# Patient Record
Sex: Female | Born: 1980
Health system: Southern US, Community
[De-identification: ages and names within clinical notes are randomized; demographics above are authoritative.]

## PROBLEM LIST (undated history)

## (undated) ENCOUNTER — Inpatient Hospital Stay (HOSPITAL_COMMUNITY): Payer: Self-pay

## (undated) DIAGNOSIS — K921 Melena: Secondary | ICD-10-CM

## (undated) DIAGNOSIS — Z8719 Personal history of other diseases of the digestive system: Secondary | ICD-10-CM

## (undated) DIAGNOSIS — R140 Abdominal distension (gaseous): Secondary | ICD-10-CM

## (undated) DIAGNOSIS — I10 Essential (primary) hypertension: Secondary | ICD-10-CM

## (undated) DIAGNOSIS — O139 Gestational [pregnancy-induced] hypertension without significant proteinuria, unspecified trimester: Secondary | ICD-10-CM

## (undated) DIAGNOSIS — R200 Anesthesia of skin: Secondary | ICD-10-CM

## (undated) DIAGNOSIS — K429 Umbilical hernia without obstruction or gangrene: Secondary | ICD-10-CM

## (undated) DIAGNOSIS — Z8619 Personal history of other infectious and parasitic diseases: Secondary | ICD-10-CM

## (undated) DIAGNOSIS — R109 Unspecified abdominal pain: Secondary | ICD-10-CM

## (undated) DIAGNOSIS — K625 Hemorrhage of anus and rectum: Secondary | ICD-10-CM

## (undated) DIAGNOSIS — K5790 Diverticulosis of intestine, part unspecified, without perforation or abscess without bleeding: Secondary | ICD-10-CM

## (undated) DIAGNOSIS — F419 Anxiety disorder, unspecified: Secondary | ICD-10-CM

## (undated) DIAGNOSIS — IMO0001 Reserved for inherently not codable concepts without codable children: Secondary | ICD-10-CM

## (undated) DIAGNOSIS — K5792 Diverticulitis of intestine, part unspecified, without perforation or abscess without bleeding: Secondary | ICD-10-CM

## (undated) DIAGNOSIS — K5900 Constipation, unspecified: Secondary | ICD-10-CM

## (undated) DIAGNOSIS — O99210 Obesity complicating pregnancy, unspecified trimester: Secondary | ICD-10-CM

## (undated) HISTORY — DX: Melena: K92.1

## (undated) HISTORY — DX: Essential (primary) hypertension: I10

## (undated) HISTORY — DX: Diverticulosis of intestine, part unspecified, without perforation or abscess without bleeding: K57.90

## (undated) HISTORY — DX: Anxiety disorder, unspecified: F41.9

## (undated) HISTORY — DX: Unspecified abdominal pain: R10.9

## (undated) HISTORY — DX: Constipation, unspecified: K59.00

## (undated) HISTORY — DX: Umbilical hernia without obstruction or gangrene: K42.9

## (undated) HISTORY — DX: Abdominal distension (gaseous): R14.0

## (undated) HISTORY — DX: Hemorrhage of anus and rectum: K62.5

---

## 2009-09-17 HISTORY — PX: SCAR REVISION: SHX5285

## 2011-07-26 ENCOUNTER — Ambulatory Visit (INDEPENDENT_AMBULATORY_CARE_PROVIDER_SITE_OTHER): Payer: 59 | Admitting: Internal Medicine

## 2011-07-26 VITALS — BP 102/82 | HR 123 | Temp 99.7°F | Resp 16 | Ht 62.5 in | Wt 282.2 lb

## 2011-07-26 DIAGNOSIS — F419 Anxiety disorder, unspecified: Secondary | ICD-10-CM | POA: Insufficient documentation

## 2011-07-26 DIAGNOSIS — R1110 Vomiting, unspecified: Secondary | ICD-10-CM

## 2011-07-26 DIAGNOSIS — R509 Fever, unspecified: Secondary | ICD-10-CM

## 2011-07-26 DIAGNOSIS — D72829 Elevated white blood cell count, unspecified: Secondary | ICD-10-CM

## 2011-07-26 DIAGNOSIS — R197 Diarrhea, unspecified: Secondary | ICD-10-CM

## 2011-07-26 LAB — POCT CBC
Granulocyte percent: 90.9 %G — AB (ref 37–80)
HCT, POC: 44.7 % (ref 37.7–47.9)
Hemoglobin: 14.9 g/dL (ref 12.2–16.2)
Lymph, poc: 0.7 (ref 0.6–3.4)
MCH, POC: 29.4 pg (ref 27–31.2)
MCHC: 33.3 g/dL (ref 31.8–35.4)
MCV: 88.4 fL (ref 80–97)
MID (cbc): 0.4 (ref 0–0.9)
MPV: 8 fL (ref 0–99.8)
POC Granulocyte: 11.2 — AB (ref 2–6.9)
POC LYMPH PERCENT: 5.8 %L — AB (ref 10–50)
POC MID %: 3.3 %M (ref 0–12)
Platelet Count, POC: 392 10*3/uL (ref 142–424)
RBC: 5.06 M/uL (ref 4.04–5.48)
RDW, POC: 12.8 %
WBC: 12.3 10*3/uL — AB (ref 4.6–10.2)

## 2011-07-26 MED ORDER — ONDANSETRON HCL 8 MG PO TABS: 8.0000 mg | ORAL_TABLET | Freq: Three times a day (TID) | ORAL | Status: AC | PRN

## 2011-07-26 MED ORDER — ONDANSETRON 4 MG PO TBDP
4.0000 mg | ORAL_TABLET | Freq: Once | ORAL | Status: AC
  Administered 2011-07-26: 8 mg via ORAL

## 2011-07-26 NOTE — Patient Instructions (Signed)
Viral Gastroenteritis Viral gastroenteritis is also known as stomach flu. This condition affects the stomach and intestinal tract. It can cause sudden diarrhea and vomiting. The illness typically lasts 3 to 8 days. Most people develop an immune response that eventually gets rid of the virus. While this natural response develops, the virus can make you quite ill. CAUSES  Many different viruses can cause gastroenteritis, such as rotavirus or noroviruses. You can catch one of these viruses by consuming contaminated food or water. You may also catch a virus by sharing utensils or other personal items with an infected person or by touching a contaminated surface. SYMPTOMS  The most common symptoms are diarrhea and vomiting. These problems can cause a severe loss of body fluids (dehydration) and a body salt (electrolyte) imbalance. Other symptoms may include:  Fever.   Headache.   Fatigue.   Abdominal pain.  DIAGNOSIS  Your caregiver can usually diagnose viral gastroenteritis based on your symptoms and a physical exam. A stool sample may also be taken to test for the presence of viruses or other infections. TREATMENT  This illness typically goes away on its own. Treatments are aimed at rehydration. The most serious cases of viral gastroenteritis involve vomiting so severely that you are not able to keep fluids down. In these cases, fluids must be given through an intravenous line (IV). HOME CARE INSTRUCTIONS   Drink enough fluids to keep your urine clear or pale yellow. Drink small amounts of fluids frequently and increase the amounts as tolerated.   Ask your caregiver for specific rehydration instructions.   Avoid:   Foods high in sugar.   Alcohol.   Carbonated drinks.   Tobacco.   Juice.   Caffeine drinks.   Extremely hot or cold fluids.   Fatty, greasy foods.   Too much intake of anything at one time.   Dairy products until 24 to 48 hours after diarrhea stops.   You may  consume probiotics. Probiotics are active cultures of beneficial bacteria. They may lessen the amount and number of diarrheal stools in adults. Probiotics can be found in yogurt with active cultures and in supplements.   Wash your hands well to avoid spreading the virus.   Only take over-the-counter or prescription medicines for pain, discomfort, or fever as directed by your caregiver. Do not give aspirin to children. Antidiarrheal medicines are not recommended.   Ask your caregiver if you should continue to take your regular prescribed and over-the-counter medicines.   Keep all follow-up appointments as directed by your caregiver.  SEEK IMMEDIATE MEDICAL CARE IF:   You are unable to keep fluids down.   You do not urinate at least once every 6 to 8 hours.   You develop shortness of breath.   You notice blood in your stool or vomit. This may look like coffee grounds.   You have abdominal pain that increases or is concentrated in one small area (localized).   You have persistent vomiting or diarrhea.   You have a fever.   The patient is a child younger than 3 months, and he or she has a fever.   The patient is a child older than 3 months, and he or she has a fever and persistent symptoms.   The patient is a child older than 3 months, and he or she has a fever and symptoms suddenly get worse.   The patient is a baby, and he or she has no tears when crying.  MAKE SURE YOU:     Understand these instructions.   Will watch your condition.   Will get help right away if you are not doing well or get worse.  Document Released: 04/11/2005 Document Revised: 03/31/2011 Document Reviewed: 01/26/2011 [employer] Patient Information 2012 [employer]. 

## 2011-07-26 NOTE — Progress Notes (Signed)
  Subjective:    Patient ID: Ann Santos, female    DOB: [birth year], 31 y.o.   MRN: [id number]  HPI 1 day of rapid onset vomiting, diarrhea , vomited 7x, diarrhea 4-5x all today. Is light headed, and fatigue with low grade fever. Others in family have same sxs.  Review of Systems     Objective:   Physical Exam Alert and oriented Lungs clear, heart normal. MMs are still moist Abd is soft, tender, no reb or gding with active BS  Results for orders placed in visit on 07/26/11  POCT CBC      Component Value Range   WBC 12.3 (*) 4.6 - 10.2 (K/uL)   Lymph, poc 0.7  0.6 - 3.4    POC LYMPH PERCENT 5.8 (*) 10 - 50 (%L)   MID (cbc) 0.4  0 - 0.9    POC MID % 3.3  0 - 12 (%M)   POC Granulocyte 11.2 (*) 2 - 6.9    Granulocyte percent 90.9 (*) 37 - 80 (%G)   RBC 5.06  4.04 - 5.48 (M/uL)   Hemoglobin 14.9  12.2 - 16.2 (g/dL)   HCT, POC 40.9  81.1 - 47.9 (%)   MCV 88.4  80 - 97 (fL)   MCH, POC 29.4  27 - 31.2 (pg)   MCHC 33.3  31.8 - 35.4 (g/dL)   RDW, POC 91.4     Platelet Count, POC 392  142 - 424 (K/uL)   MPV 8.0  0 - 99.8 (fL)   Leukocytosis      Assessment & Plan:  Viral gastroenteritis Zofran ODT 8mg  now po Diet Hydration

## 2011-08-04 ENCOUNTER — Telehealth: Payer: Self-pay

## 2011-08-04 NOTE — Telephone Encounter (Signed)
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

## 2011-08-07 NOTE — Telephone Encounter (Signed)
LMOM TO CB 

## 2011-08-08 NOTE — Telephone Encounter (Signed)
Ok or note. Please complete and fax.  Genie Wenke

## 2011-08-08 NOTE — Telephone Encounter (Signed)
[facility] notifying patient that note was faxed.

## 2011-08-08 NOTE — Telephone Encounter (Signed)
Pt CB to check on OOW note. She was here on Apr 2 on trip from [place]. She was here sick for a wk and then RT [place]. 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

## 2011-08-09 ENCOUNTER — Telehealth: Payer: Self-pay

## 2011-08-09 NOTE — Telephone Encounter (Signed)
Pt states she gave incorrect fax no to telephone operator yesterday, she needs oow note faxed to employer at [phone] instead??

## 2011-08-09 NOTE — Telephone Encounter (Signed)
Letter faxed to updated fax number.

## 2011-10-05 ENCOUNTER — Inpatient Hospital Stay (HOSPITAL_COMMUNITY)
Admission: EM | Admit: 2011-10-05 | Discharge: 2011-10-07 | DRG: 379 | Disposition: A | Discharge status: Home / Self Care | Payer: 59 | Attending: Internal Medicine | Admitting: Internal Medicine

## 2011-10-05 ENCOUNTER — Encounter (HOSPITAL_COMMUNITY): Payer: Self-pay

## 2011-10-05 DIAGNOSIS — K644 Residual hemorrhoidal skin tags: Secondary | ICD-10-CM | POA: Diagnosis present

## 2011-10-05 DIAGNOSIS — K2970 Gastritis, unspecified, without bleeding: Secondary | ICD-10-CM | POA: Diagnosis present

## 2011-10-05 DIAGNOSIS — K429 Umbilical hernia without obstruction or gangrene: Secondary | ICD-10-CM | POA: Diagnosis present

## 2011-10-05 DIAGNOSIS — K5731 Diverticulosis of large intestine without perforation or abscess with bleeding: Principal | ICD-10-CM | POA: Diagnosis present

## 2011-10-05 DIAGNOSIS — F411 Generalized anxiety disorder: Secondary | ICD-10-CM | POA: Diagnosis present

## 2011-10-05 DIAGNOSIS — K648 Other hemorrhoids: Secondary | ICD-10-CM | POA: Diagnosis present

## 2011-10-05 DIAGNOSIS — Z79899 Other long term (current) drug therapy: Secondary | ICD-10-CM

## 2011-10-05 DIAGNOSIS — R109 Unspecified abdominal pain: Secondary | ICD-10-CM

## 2011-10-05 DIAGNOSIS — F419 Anxiety disorder, unspecified: Secondary | ICD-10-CM

## 2011-10-05 DIAGNOSIS — K922 Gastrointestinal hemorrhage, unspecified: Secondary | ICD-10-CM

## 2011-10-05 DIAGNOSIS — K625 Hemorrhage of anus and rectum: Secondary | ICD-10-CM | POA: Diagnosis present

## 2011-10-05 HISTORY — DX: Diverticulitis of intestine, part unspecified, without perforation or abscess without bleeding: K57.92

## 2011-10-05 LAB — CBC
HCT: 39.6 % (ref 36.0–46.0)
Hemoglobin: 13.6 g/dL (ref 12.0–15.0)
MCH: 30 pg (ref 26.0–34.0)
MCHC: 34.3 g/dL (ref 30.0–36.0)
MCV: 87.4 fL (ref 78.0–100.0)
Platelets: 345 10*3/uL (ref 150–400)
RBC: 4.53 MIL/uL (ref 3.87–5.11)
RDW: 12.7 % (ref 11.5–15.5)
WBC: 10.6 10*3/uL — ABNORMAL HIGH (ref 4.0–10.5)

## 2011-10-05 LAB — COMPREHENSIVE METABOLIC PANEL
ALT: 16 U/L (ref 0–35)
AST: 16 U/L (ref 0–37)
Albumin: 3.5 g/dL (ref 3.5–5.2)
Alkaline Phosphatase: 54 U/L (ref 39–117)
BUN: 13 mg/dL (ref 6–23)
CO2: 25 mEq/L (ref 19–32)
Calcium: 9.3 mg/dL (ref 8.4–10.5)
Chloride: 101 mEq/L (ref 96–112)
Creatinine, Ser: 0.58 mg/dL (ref 0.50–1.10)
GFR calc Af Amer: >90 mL/min (ref >90)
GFR calc non Af Amer: >90 mL/min (ref >90)
Glucose, Bld: 114 mg/dL — ABNORMAL HIGH (ref 70–99)
Potassium: 3.5 mEq/L (ref 3.5–5.1)
Sodium: 136 mEq/L (ref 135–145)
Total Bilirubin: 0.2 mg/dL — ABNORMAL LOW (ref 0.3–1.2)
Total Protein: 7.2 g/dL (ref 6.0–8.3)

## 2011-10-05 LAB — TYPE AND SCREEN
ABO/RH(D): A POS
Antibody Screen: NEGATIVE

## 2011-10-05 LAB — ABO/RH: ABO/RH(D): A POS

## 2011-10-05 NOTE — ED Notes (Signed)
Orthostatic blood pressure performed.  Pt noted no dizziness, blurred vision, tunnel vision, light headedness, or seeing spots.

## 2011-10-05 NOTE — ED Notes (Signed)
Abdominal pain x 4 days.  Has had bleeding w/BM x 2, last one being about ago. States her abd feels swollen, bloated, crampy and lethargic.  No N/V/D, fevers.

## 2011-10-05 NOTE — ED Notes (Signed)
Pt states abdominal pain for 2 days with bright red blood from rectum. Pt c/o abdominal bloating and unable to get deep breath with back pain. Pt alert and talk skin warm and dry.

## 2011-10-06 ENCOUNTER — Encounter (HOSPITAL_COMMUNITY): Payer: Self-pay | Admitting: Internal Medicine

## 2011-10-06 ENCOUNTER — Emergency Department (HOSPITAL_COMMUNITY): Payer: 59

## 2011-10-06 DIAGNOSIS — R109 Unspecified abdominal pain: Secondary | ICD-10-CM | POA: Diagnosis present

## 2011-10-06 DIAGNOSIS — K429 Umbilical hernia without obstruction or gangrene: Secondary | ICD-10-CM

## 2011-10-06 DIAGNOSIS — K625 Hemorrhage of anus and rectum: Secondary | ICD-10-CM | POA: Diagnosis present

## 2011-10-06 DIAGNOSIS — K922 Gastrointestinal hemorrhage, unspecified: Secondary | ICD-10-CM

## 2011-10-06 DIAGNOSIS — R1033 Periumbilical pain: Secondary | ICD-10-CM

## 2011-10-06 LAB — BASIC METABOLIC PANEL
BUN: 12 mg/dL (ref 6–23)
CO2: 25 mEq/L (ref 19–32)
Calcium: 8.7 mg/dL (ref 8.4–10.5)
Chloride: 101 mEq/L (ref 96–112)
Creatinine, Ser: 0.56 mg/dL (ref 0.50–1.10)
GFR calc Af Amer: >90 mL/min (ref >90)
GFR calc non Af Amer: >90 mL/min (ref >90)
Glucose, Bld: 109 mg/dL — ABNORMAL HIGH (ref 70–99)
Potassium: 3.5 mEq/L (ref 3.5–5.1)
Sodium: 135 mEq/L (ref 135–145)

## 2011-10-06 LAB — CBC
HCT: 37.5 % (ref 36.0–46.0)
Hemoglobin: 12.9 g/dL (ref 12.0–15.0)
MCH: 30.1 pg (ref 26.0–34.0)
MCHC: 34.4 g/dL (ref 30.0–36.0)
MCV: 87.4 fL (ref 78.0–100.0)
Platelets: 305 10*3/uL (ref 150–400)
RBC: 4.29 MIL/uL (ref 3.87–5.11)
RDW: 12.7 % (ref 11.5–15.5)
WBC: 9.2 10*3/uL (ref 4.0–10.5)

## 2011-10-06 LAB — OCCULT BLOOD, POC DEVICE: Fecal Occult Bld: POSITIVE

## 2011-10-06 MED ORDER — PEG 3350-KCL-NA BICARB-NACL 420 G PO SOLR
4000.0000 mL | Freq: Once | ORAL | Status: AC
  Administered 2011-10-06: 4000 mL via ORAL
  Filled 2011-10-06: qty 4000

## 2011-10-06 MED ORDER — ACETAMINOPHEN 650 MG RE SUPP: 650.0000 mg | Freq: Four times a day (QID) | RECTAL | Status: DC | PRN

## 2011-10-06 MED ORDER — DOCUSATE SODIUM 100 MG PO CAPS
100.0000 mg | ORAL_CAPSULE | Freq: Two times a day (BID) | ORAL | Status: DC
  Administered 2011-10-06 (×2): 100 mg via ORAL
  Filled 2011-10-06 (×4): qty 1

## 2011-10-06 MED ORDER — HYDROMORPHONE HCL PF 1 MG/ML IJ SOLN
1.0000 mg | Freq: Once | INTRAMUSCULAR | Status: AC
  Administered 2011-10-06: 1 mg via INTRAVENOUS
  Filled 2011-10-06: qty 1

## 2011-10-06 MED ORDER — ACETAMINOPHEN 325 MG PO TABS
650.0000 mg | ORAL_TABLET | Freq: Four times a day (QID) | ORAL | Status: DC | PRN
  Administered 2011-10-06 – 2011-10-07 (×3): 650 mg via ORAL
  Filled 2011-10-06 (×3): qty 2

## 2011-10-06 MED ORDER — PROMETHAZINE HCL 25 MG/ML IJ SOLN
12.5000 mg | Freq: Four times a day (QID) | INTRAMUSCULAR | Status: DC | PRN
Start: 2011-10-06 — End: 2011-10-07
  Administered 2011-10-06 – 2011-10-07 (×3): 12.5 mg via INTRAVENOUS
  Filled 2011-10-06 (×3): qty 1

## 2011-10-06 MED ORDER — TRAZODONE HCL 100 MG PO TABS
100.0000 mg | ORAL_TABLET | Freq: Every day | ORAL | Status: DC
  Administered 2011-10-06: 100 mg via ORAL
  Filled 2011-10-06 (×2): qty 1

## 2011-10-06 MED ORDER — IOHEXOL 300 MG/ML  SOLN
100.0000 mL | Freq: Once | INTRAMUSCULAR | Status: AC | PRN
  Administered 2011-10-06: 100 mL via INTRAVENOUS

## 2011-10-06 MED ORDER — SODIUM CHLORIDE 0.9 % IV SOLN
Freq: Once | INTRAVENOUS | Status: AC
  Administered 2011-10-06: 04:00:00 via INTRAVENOUS

## 2011-10-06 MED ORDER — ONDANSETRON HCL 4 MG/2ML IJ SOLN
4.0000 mg | Freq: Once | INTRAMUSCULAR | Status: AC
  Administered 2011-10-06: 4 mg via INTRAVENOUS
  Filled 2011-10-06: qty 2

## 2011-10-06 MED ORDER — ONDANSETRON HCL 4 MG/2ML IJ SOLN
4.0000 mg | Freq: Four times a day (QID) | INTRAMUSCULAR | Status: DC | PRN
  Administered 2011-10-06 (×2): 4 mg via INTRAVENOUS
  Filled 2011-10-06 (×2): qty 2

## 2011-10-06 MED ORDER — MORPHINE SULFATE 4 MG/ML IJ SOLN
4.0000 mg | INTRAMUSCULAR | Status: DC | PRN
  Administered 2011-10-06 (×2): 4 mg via INTRAVENOUS
  Filled 2011-10-06 (×3): qty 1

## 2011-10-06 MED ORDER — SENNA 8.6 MG PO TABS
1.0000 | ORAL_TABLET | Freq: Two times a day (BID) | ORAL | Status: DC
  Administered 2011-10-06 (×2): 8.6 mg via ORAL
  Filled 2011-10-06 (×2): qty 1

## 2011-10-06 MED ORDER — ONDANSETRON HCL 4 MG PO TABS: 4.0000 mg | ORAL_TABLET | Freq: Four times a day (QID) | ORAL | Status: DC | PRN

## 2011-10-06 MED ORDER — SERTRALINE HCL 100 MG PO TABS
100.0000 mg | ORAL_TABLET | Freq: Every day | ORAL | Status: DC
  Administered 2011-10-06 – 2011-10-07 (×2): 100 mg via ORAL
  Filled 2011-10-06 (×2): qty 2

## 2011-10-06 NOTE — Consult Note (Signed)
Reason for Consult: Hematochezia Referring Physician: [facility]  Baeleigh Melchior HPI: This is a 31 year old female with a reported history of diverticulitis x 2 admitted for hematochezia.  She recently moved from [place] and on her way over to [place] she reported having some crampy abdominal pain.  The pain persisted for several days and then she had a couple of minor episodes of hematochezia earlier this week. Subsequently she had a significant amount of hematochezia last evening and then she presented to the ER.  A CT scan was performed and it documents diverticula and no diverticulitis.  There is no prior history of endoscopic evaluation and no prior history PUD.  Currently she has some nausea and she thinks this may be from the lack of PO intake.  Past Medical History  Diagnosis Date  . Diverticulitis     Per pt. States she never had CT scan to confirm this    History reviewed. No pertinent past surgical history.  History reviewed. No pertinent family history.  Social History:  reports that she has never smoked. She does not have any smokeless tobacco history on file. Her alcohol and drug histories not on file.  Allergies: No Known Allergies  Medications:  Scheduled:   . sodium chloride   Intravenous Once  . docusate sodium  100 mg Oral BID  .  HYDROmorphone (DILAUDID) injection  1 mg Intravenous Once  . ondansetron  4 mg Intravenous Once  . senna  1 tablet Oral BID  . sertraline  100 mg Oral Daily  . traZODone  100 mg Oral QHS   Continuous:   Results for orders placed during the hospital encounter of 10/05/11 (from the past 24 hour(s))  ABO/RH     Status: Normal   Collection Time   10/05/11 10:21 PM      Component Value Range   ABO/RH(D) A POS    CBC     Status: Abnormal   Collection Time   10/05/11 10:29 PM      Component Value Range   WBC 10.6 (*) 4.0 - 10.5 K/uL   RBC 4.53  3.87 - 5.11 MIL/uL   Hemoglobin 13.6  12.0 - 15.0 g/dL   HCT 16.1  09.6 - 04.5 %   MCV 87.4  78.0 - 100.0 fL   MCH 30.0  26.0 - 34.0 pg   MCHC 34.3  30.0 - 36.0 g/dL   RDW 40.9  81.1 - 91.4 %   Platelets 345  150 - 400 K/uL  COMPREHENSIVE METABOLIC PANEL     Status: Abnormal   Collection Time   10/05/11 10:29 PM      Component Value Range   Sodium 136  135 - 145 mEq/L   Potassium 3.5  3.5 - 5.1 mEq/L   Chloride 101  96 - 112 mEq/L   CO2 25  19 - 32 mEq/L   Glucose, Bld 114 (*) 70 - 99 mg/dL   BUN 13  6 - 23 mg/dL   Creatinine, Ser 7.82  0.50 - 1.10 mg/dL   Calcium 9.3  8.4 - 95.6 mg/dL   Total Protein 7.2  6.0 - 8.3 g/dL   Albumin 3.5  3.5 - 5.2 g/dL   AST 16  0 - 37 U/L   ALT 16  0 - 35 U/L   Alkaline Phosphatase 54  39 - 117 U/L   Total Bilirubin 0.2 (*) 0.3 - 1.2 mg/dL   GFR calc non Af Amer >90  >90 mL/min  GFR calc Af Amer >90  >90 mL/min  TYPE AND SCREEN     Status: Normal   Collection Time   10/05/11 10:29 PM      Component Value Range   ABO/RH(D) A POS     Antibody Screen NEG     Sample Expiration 10/08/2011    OCCULT BLOOD, POC DEVICE     Status: Normal   Collection Time   10/06/11 12:06 AM      Component Value Range   Fecal Occult Bld POSITIVE    CBC     Status: Normal   Collection Time   10/06/11  3:55 AM      Component Value Range   WBC 9.2  4.0 - 10.5 K/uL   RBC 4.29  3.87 - 5.11 MIL/uL   Hemoglobin 12.9  12.0 - 15.0 g/dL   HCT 16.1  09.6 - 04.5 %   MCV 87.4  78.0 - 100.0 fL   MCH 30.1  26.0 - 34.0 pg   MCHC 34.4  30.0 - 36.0 g/dL   RDW 40.9  81.1 - 91.4 %   Platelets 305  150 - 400 K/uL  BASIC METABOLIC PANEL     Status: Abnormal   Collection Time   10/06/11  3:55 AM      Component Value Range   Sodium 135  135 - 145 mEq/L   Potassium 3.5  3.5 - 5.1 mEq/L   Chloride 101  96 - 112 mEq/L   CO2 25  19 - 32 mEq/L   Glucose, Bld 109 (*) 70 - 99 mg/dL   BUN 12  6 - 23 mg/dL   Creatinine, Ser 7.82  0.50 - 1.10 mg/dL   Calcium 8.7  8.4 - 95.6 mg/dL   GFR calc non Af Amer >90  >90 mL/min   GFR calc Af Amer >90  >90 mL/min     Ct  Abdomen Pelvis W Contrast  10/06/2011  *RADIOLOGY REPORT*  Clinical Data: Lower abdominal pain  CT ABDOMEN AND PELVIS WITH CONTRAST  Technique:  Multidetector CT imaging of the abdomen and pelvis was performed following the standard protocol during bolus administration of intravenous contrast.  Contrast: OMNIPAQUE IOHEXOL 300 MG/ML  SOLN  Comparison:  None  Findings: Limited images through the lung bases demonstrate no significant appreciable abnormality. The heart size is within normal limits. No pleural or pericardial effusion.  Hepatic steatosis is suggested.  Unremarkable biliary system, spleen, pancreas, adrenal glands, kidneys.  No hydronephrosis or hydroureter.  No bowel obstruction.  Colonic diverticulosis.  Appendix within normal limits.  No free intraperitoneal air or fluid.  There is a fat containing umbilical hernia with stranding of the herniated fat.  No lymphadenopathy.  Normal caliber vasculature.  Thin-walled bladder.  Unremarkable CT appearance to the uterus and adnexa.  No acute osseous finding.  IMPRESSION: Fat containing umbilical hernia with associated fat stranding suggests incarceration.  Colonic diverticulosis without CT evidence for diverticulitis.  Original Report Authenticated By: Waneta Martins, M.D.    ROS:  As stated above in the HPI otherwise negative.  Blood pressure 128/83, pulse 83, temperature 98 F (36.7 C), temperature source Oral, resp. rate 20, height 5\' 2"  (1.575 m), weight 80.287 kg (177 lb), last menstrual period 09/14/2011, SpO2 100.00%.    PE: Gen: NAD, Alert and Oriented HEENT:  [place]/AT, EOMI Neck: Supple, no LAD Lungs: CTA Bilaterally CV: RRR without M/G/R ABM: Soft, NTND, morbidly obese, +BS Ext: No C/C/E  Assessment/Plan: 1) Hematochezia. 2)  Diverticula. 3) Nausea.   Her symptoms can be consistent with a diverticular bleed.  She is hemodynamically stable, but further evaluation is required at this time.  Plan: 1) EGD/Colonoscopy  tomorrow.  Rohini Jaroszewski D 10/06/2011, 6:13 PM

## 2011-10-06 NOTE — ED Notes (Signed)
Patient transported to CT 

## 2011-10-06 NOTE — ED Provider Notes (Signed)
History     CSN: [id number]  Arrival date & time 10/05/11  2139   First MD Initiated Contact with Patient 10/05/11 2346      Chief Complaint  Patient presents with  . Abdominal Pain  . GI Bleeding    (Consider location/radiation/quality/duration/timing/severity/associated sxs/prior treatment) The history is provided by the patient.   abdominal discomfort over the last few days. Recently moved here from [place] and has history of diverticulitis diagnosed on CT scan, treated a few years ago. Today he developed rectal bleeding, right red blood with bowel movements. Abdominal discomfort is all over especially to lower abdomen, scribed as crampy in quality, no associated nausea vomiting or diarrhea. No radiation of pain. Moderate in severity. No history of bleeding. No rectal pain. No trauma. No aspirin or anticoagulants. No history of same. No known aggravating or alleviating factors.  Past Medical History  Diagnosis Date  . Diverticulitis     No past surgical history on file.  No family history on file.  History  Substance Use Topics  . Smoking status: Never Smoker   . Smokeless tobacco: Not on file  . Alcohol Use: Not on file    OB History    Grav Para Term Preterm Abortions TAB SAB Ect Mult Living                  Review of Systems  Constitutional: Negative for fever and chills.  HENT: Negative for neck pain and neck stiffness.   Eyes: Negative for pain.  Respiratory: Negative for shortness of breath.   Cardiovascular: Negative for chest pain.  Gastrointestinal: Positive for abdominal pain and blood in stool.  Genitourinary: Negative for dysuria.  Musculoskeletal: Negative for back pain.  Skin: Negative for rash.  Neurological: Negative for headaches.  All other systems reviewed and are negative.    Allergies  Review of patient's allergies indicates no known allergies.  Home Medications   Current Outpatient Rx  Name Route Sig Dispense Refill  . SERTRALINE  HCL 100 MG PO TABS Oral Take 100 mg by mouth daily.    . TRAZODONE HCL 100 MG PO TABS Oral Take 100 mg by mouth at bedtime.      BP 140/96  Pulse 81  Temp 98.2 F (36.8 C) (Oral)  Resp 20  Ht 5\' 2"  (1.575 m)  Wt 277 lb (125.646 kg)  BMI 50.66 kg/m2  LMP 09/14/2011  Physical Exam  Constitutional: She is oriented to person, place, and time. She appears well-developed and well-nourished.  HENT:  Head: Normocephalic and atraumatic.  Eyes: Conjunctivae and EOM are normal. Pupils are equal, round, and reactive to light.  Neck: Trachea normal. Neck supple. No thyromegaly present.  Cardiovascular: Normal rate, regular rhythm, S1 normal, S2 normal and normal pulses.     No systolic murmur is present   No diastolic murmur is present  Pulses:      Radial pulses are 2+ on the right side, and 2+ on the left side.  Pulmonary/Chest: Effort normal and breath sounds normal. She has no wheezes. She has no rhonchi. She has no rales. She exhibits no tenderness.  Abdominal: Soft. Normal appearance and bowel sounds are normal. She exhibits no distension. There is no rebound, no guarding, no CVA tenderness and negative Murphy's sign.       Obese with mild diffuse tenderness especially over right and left lower quadrants.   Genitourinary:       Rectal exam: Red blood fingertip, no mass or tenderness  Musculoskeletal:       BLE:s Calves nontender, no cords or erythema, negative Homans sign  Neurological: She is alert and oriented to person, place, and time. She has normal strength. No cranial nerve deficit or sensory deficit. GCS eye subscore is 4. GCS verbal subscore is 5. GCS motor subscore is 6.  Skin: Skin is warm and dry. No rash noted. She is not diaphoretic.  Psychiatric: Her speech is normal.       Cooperative and appropriate    ED Course  Procedures (including critical care time)  Results for orders placed during the hospital encounter of 10/05/11  CBC      Component Value Range   WBC  10.6 (*) 4.0 - 10.5 K/uL   RBC 4.53  3.87 - 5.11 MIL/uL   Hemoglobin 13.6  12.0 - 15.0 g/dL   HCT 78.2  95.6 - 21.3 %   MCV 87.4  78.0 - 100.0 fL   MCH 30.0  26.0 - 34.0 pg   MCHC 34.3  30.0 - 36.0 g/dL   RDW 08.6  57.8 - 46.9 %   Platelets 345  150 - 400 K/uL  COMPREHENSIVE METABOLIC PANEL      Component Value Range   Sodium 136  135 - 145 mEq/L   Potassium 3.5  3.5 - 5.1 mEq/L   Chloride 101  96 - 112 mEq/L   CO2 25  19 - 32 mEq/L   Glucose, Bld 114 (*) 70 - 99 mg/dL   BUN 13  6 - 23 mg/dL   Creatinine, Ser 6.29  0.50 - 1.10 mg/dL   Calcium 9.3  8.4 - 52.8 mg/dL   Total Protein 7.2  6.0 - 8.3 g/dL   Albumin 3.5  3.5 - 5.2 g/dL   AST 16  0 - 37 U/L   ALT 16  0 - 35 U/L   Alkaline Phosphatase 54  39 - 117 U/L   Total Bilirubin 0.2 (*) 0.3 - 1.2 mg/dL   GFR calc non Af Amer >90  >90 mL/min   GFR calc Af Amer >90  >90 mL/min  TYPE AND SCREEN      Component Value Range   ABO/RH(D) A POS     Antibody Screen NEG     Sample Expiration 10/08/2011    ABO/RH      Component Value Range   ABO/RH(D) A POS    OCCULT BLOOD, POC DEVICE      Component Value Range   Fecal Occult Bld POSITIVE    CBC      Component Value Range   WBC 9.2  4.0 - 10.5 K/uL   RBC 4.29  3.87 - 5.11 MIL/uL   Hemoglobin 12.9  12.0 - 15.0 g/dL   HCT 41.3  24.4 - 01.0 %   MCV 87.4  78.0 - 100.0 fL   MCH 30.1  26.0 - 34.0 pg   MCHC 34.4  30.0 - 36.0 g/dL   RDW 27.2  53.6 - 64.4 %   Platelets 305  150 - 400 K/uL  BASIC METABOLIC PANEL      Component Value Range   Sodium 135  135 - 145 mEq/L   Potassium 3.5  3.5 - 5.1 mEq/L   Chloride 101  96 - 112 mEq/L   CO2 25  19 - 32 mEq/L   Glucose, Bld 109 (*) 70 - 99 mg/dL   BUN 12  6 - 23 mg/dL   Creatinine, Ser 0.34  0.50 - 1.10  mg/dL   Calcium 8.7  8.4 - 09.8 mg/dL   GFR calc non Af Amer >90  >90 mL/min   GFR calc Af Amer >90  >90 mL/min   Ct Abdomen Pelvis W Contrast  10/06/2011  *RADIOLOGY REPORT*  Clinical Data: Lower abdominal pain  CT ABDOMEN AND  PELVIS WITH CONTRAST  Technique:  Multidetector CT imaging of the abdomen and pelvis was performed following the standard protocol during bolus administration of intravenous contrast.  Contrast: OMNIPAQUE IOHEXOL 300 MG/ML  SOLN  Comparison:  None  Findings: Limited images through the lung bases demonstrate no significant appreciable abnormality. The heart size is within normal limits. No pleural or pericardial effusion.  Hepatic steatosis is suggested.  Unremarkable biliary system, spleen, pancreas, adrenal glands, kidneys.  No hydronephrosis or hydroureter.  No bowel obstruction.  Colonic diverticulosis.  Appendix within normal limits.  No free intraperitoneal air or fluid.  There is a fat containing umbilical hernia with stranding of the herniated fat.  No lymphadenopathy.  Normal caliber vasculature.  Thin-walled bladder.  Unremarkable CT appearance to the uterus and adnexa.  No acute osseous finding.  IMPRESSION: Fat containing umbilical hernia with associated fat stranding suggests incarceration.  Colonic diverticulosis without CT evidence for diverticulitis.  Original Report Authenticated By: Waneta Martins, M.D.   Pain medications provided. IV fluids. Right Red blood per rectum on exam. Labs obtained and reviewed as above.   1. GI bleeding     1245am d/w Dr Kaylyn Layer as above, will admit for GI bleed, CT pending  MDM   Rectal bleeding with history of diverticulitis. Possible diverticular bleed. No obvious hemorrhoids on exam. Nursing notes reviewed. Vital signs reviewed. No old records available. Medicine consult and CT obtained. Results reviewed as above. On recheck does have palpable umbilical hernia that is not significantly tender. Plan medical admission.        Sunnie Nielsen, MD 10/06/11 [phone]

## 2011-10-06 NOTE — ED Notes (Signed)
MD at bedside. Dr. Bono, hospitalist , at bedside.  

## 2011-10-06 NOTE — Progress Notes (Signed)
[facility] PROGRESS NOTE  Ann Santos ZOX:[id number] DOB: January 03, 1981 DOA: 10/05/2011 PCP: No primary provider on file.  Assessment/Plan:  Principal Problem:  *BRBPR (bright red blood per rectum); probably secondary to diverticulosis.  No diverticulitis. GI consult called, as this is her 3 rd episode in the last one year and she has family h/o of UC, she might need diverticulosis.  Iv anti emetics Clear liquid diet. Serial H&H. Pain control  Active Problems:  Abdominal pain: ? Incarcerated umbilical hernia . Surgery called to see if she needs any intervention at this time.   Code Status:FULL CODE  Kathlen Mody, MD  [facility] Pager [phone]  If 7PM-7AM, please contact night-coverage [email or web address] Password [email or web address] 10/06/2011, 11:24 AM   LOS: 1 day   Brief narrative: 31yoF with h/o prior diverticulitis x2 but no other major comorbidities  presents with abdominal pain and fullness, and BRBPR.    Consultants:  GI CONSULT  SURGERY CONSULT  Procedures:  CT ABD AND PELVIS.  Antibiotics:  NONE  HPI/Subjective: Slightly nauseated.   Objective: Filed Vitals:   10/06/11 0305 10/06/11 0312 10/06/11 0314 10/06/11 0727  BP: 140/84 123/78    Pulse: 83 77    Temp:  98.8 F (37.1 C)    TempSrc:  Oral    Resp: 16 18    Height:   5\' 2"  (1.575 m)   Weight:   125.646 kg (277 lb) 80.287 kg (177 lb)  SpO2: 99% 96%     No intake or output data in the 24 hours ending 10/06/11 1124  Exam:   General:  ALERT AFEBRILE comfortable  Cardiovascular: S1S2 normal RRR, no MRG  Respiratory: CTAB, no wheezing or rhonchi  Abdomen: soft , mildly tender in the umbilicus area. BS+  Data Reviewed: Basic Metabolic Panel:  Lab 10/06/11 8295 10/05/11 2229  NA 135 136  K 3.5 3.5  CL 101 101  CO2 25 25  GLUCOSE 109* 114*  BUN 12 13  CREATININE 0.56 0.58  CALCIUM 8.7 9.3  MG -- --  PHOS -- --   Liver Function Tests:  Lab 10/05/11 2229  AST 16  ALT  16  ALKPHOS 54  BILITOT 0.2*  PROT 7.2  ALBUMIN 3.5   No results found for this basename: LIPASE:5,AMYLASE:5 in the last 168 hours No results found for this basename: AMMONIA:5 in the last 168 hours CBC:  Lab 10/06/11 0355 10/05/11 2229  WBC 9.2 10.6*  NEUTROABS -- --  HGB 12.9 13.6  HCT 37.5 39.6  MCV 87.4 87.4  PLT 305 345   Cardiac Enzymes: No results found for this basename: CKTOTAL:5,CKMB:5,CKMBINDEX:5,TROPONINI:5 in the last 168 hours BNP (last 3 results) No results found for this basename: PROBNP:3 in the last 8760 hours CBG: No results found for this basename: GLUCAP:5 in the last 168 hours  No results found for this or any previous visit (from the past 240 hour(s)).   Studies: Ct Abdomen Pelvis W Contrast  10/06/2011  *RADIOLOGY REPORT*  Clinical Data: Lower abdominal pain  CT ABDOMEN AND PELVIS WITH CONTRAST  Technique:  Multidetector CT imaging of the abdomen and pelvis was performed following the standard protocol during bolus administration of intravenous contrast.  Contrast: OMNIPAQUE IOHEXOL 300 MG/ML  SOLN  Comparison:  None  Findings: Limited images through the lung bases demonstrate no significant appreciable abnormality. The heart size is within normal limits. No pleural or pericardial effusion.  Hepatic steatosis is suggested.  Unremarkable biliary system, spleen, pancreas, adrenal glands, kidneys.  No  hydronephrosis or hydroureter.  No bowel obstruction.  Colonic diverticulosis.  Appendix within normal limits.  No free intraperitoneal air or fluid.  There is a fat containing umbilical hernia with stranding of the herniated fat.  No lymphadenopathy.  Normal caliber vasculature.  Thin-walled bladder.  Unremarkable CT appearance to the uterus and adnexa.  No acute osseous finding.  IMPRESSION: Fat containing umbilical hernia with associated fat stranding suggests incarceration.  Colonic diverticulosis without CT evidence for diverticulitis.  Original Report  Authenticated By: Waneta Martins, M.D.    Scheduled Meds:   . sodium chloride   Intravenous Once  . docusate sodium  100 mg Oral BID  .  HYDROmorphone (DILAUDID) injection  1 mg Intravenous Once  . ondansetron  4 mg Intravenous Once  . senna  1 tablet Oral BID  . sertraline  100 mg Oral Daily  . traZODone  100 mg Oral QHS   Continuous Infusions:

## 2011-10-06 NOTE — Consult Note (Signed)
Reason for Consult:Umbilical hernia Referring Physician:  Eila Santos is an 31 y.o. female.   Ann Santos:[id number] is a 32 year old white female who just recently moved [place] from [place]. He reports developing some pain in her abdomen after reaching up to some high cabinets. Ann Santos could feel something over her abdomen that has now gotten better. Over the last 4 days Ann Santos's also had some abdominal discomfort and felt like bloating it was just uncomfortable with varying intensity. Ann Santos said it was always there but not bad enough to come to Dr. Geronimo Running had some nausea but no vomiting and then about 2 days ago started having blood in her stool. Ann Santos was admitted early this a.m.  with abdominal pain, and blood in her stool. Ann Santos was admitted for observation early this a.m. for possible GI bleed. Her hemoglobin and hematocrit are stable. Ann Santos has a history of diverticulitis and this was felt to be a diverticular GI bleed. Ann Santos has no prior history of colonoscopy. Ann Santos has 2 prior episodes of diverticulitis treated in [place]. Ann Santos's never had a GI consultation or been evaluated for this.   CT scan obtained today hepatic steatosis, there is a fat-containing umbilical hernia with stranding of the herniated fat. Colonic diverticulosis without diverticulitis. We were asked to see in consultation.   Past Medical History  Diagnosis Date  . Diverticulitis     Per pt. States Ann Santos never had CT scan to confirm this  Hx Anxiety BMI 50.8 with 30 pound wt loss over last 4 months on a diet.   PSH:  C-section with major staph infection, Wound vac and closure 2 years ago History reviewed. No pertinent past surgical history.  History reviewed. No pertinent family history.  Social History:  reports that Ann Santos has never smoked. Ann Santos does not have any smokeless tobacco history on file. Her alcohol and drug histories not on file.  Allergies: No Known Allergies  Medications:  Prior to Admission:  Prescriptions prior to  admission  Medication Sig Dispense Refill  . sertraline (ZOLOFT) 100 MG tablet Take 100 mg by mouth daily.      . traZODone (DESYREL) 100 MG tablet Take 100 mg by mouth at bedtime.       Scheduled:   . sodium chloride   Intravenous Once  . docusate sodium  100 mg Oral BID  .  HYDROmorphone (DILAUDID) injection  1 mg Intravenous Once  . ondansetron  4 mg Intravenous Once  . senna  1 tablet Oral BID  . sertraline  100 mg Oral Daily  . traZODone  100 mg Oral QHS   Continuous:  WGN:[id number], acetaminophen, iohexol, morphine injection, ondansetron (ZOFRAN) IV, ondansetron Anti-infectives    None      Results for orders placed during the hospital encounter of 10/05/11 (from the past 48 hour(s))  ABO/RH     Status: Normal   Collection Time   10/05/11 10:21 PM      Component Value Range Comment   ABO/RH(D) A POS     CBC     Status: Abnormal   Collection Time   10/05/11 10:29 PM      Component Value Range Comment   WBC 10.6 (*) 4.0 - 10.5 K/uL    RBC 4.53  3.87 - 5.11 MIL/uL    Hemoglobin 13.6  12.0 - 15.0 g/dL    HCT 96.2  95.2 - 84.1 %    MCV 87.4  78.0 - 100.0 fL    MCH 30.0  26.0 - 34.0 pg  MCHC 34.3  30.0 - 36.0 g/dL    RDW 14.7  82.9 - 56.2 %    Platelets 345  150 - 400 K/uL   COMPREHENSIVE METABOLIC PANEL     Status: Abnormal   Collection Time   10/05/11 10:29 PM      Component Value Range Comment   Sodium 136  135 - 145 mEq/L    Potassium 3.5  3.5 - 5.1 mEq/L    Chloride 101  96 - 112 mEq/L    CO2 25  19 - 32 mEq/L    Glucose, Bld 114 (*) 70 - 99 mg/dL    BUN 13  6 - 23 mg/dL    Creatinine, Ser 1.30  0.50 - 1.10 mg/dL    Calcium 9.3  8.4 - 86.5 mg/dL    Total Protein 7.2  6.0 - 8.3 g/dL    Albumin 3.5  3.5 - 5.2 g/dL    AST 16  0 - 37 U/L    ALT 16  0 - 35 U/L    Alkaline Phosphatase 54  39 - 117 U/L    Total Bilirubin 0.2 (*) 0.3 - 1.2 mg/dL    GFR calc non Af Amer >90  >90 mL/min    GFR calc Af Amer >90  >90 mL/min   TYPE AND SCREEN     Status: Normal    Collection Time   10/05/11 10:29 PM      Component Value Range Comment   ABO/RH(D) A POS      Antibody Screen NEG      Sample Expiration 10/08/2011     OCCULT BLOOD, POC DEVICE     Status: Normal   Collection Time   10/06/11 12:06 AM      Component Value Range Comment   Fecal Occult Bld POSITIVE     CBC     Status: Normal   Collection Time   10/06/11  3:55 AM      Component Value Range Comment   WBC 9.2  4.0 - 10.5 K/uL    RBC 4.29  3.87 - 5.11 MIL/uL    Hemoglobin 12.9  12.0 - 15.0 g/dL    HCT 78.4  69.6 - 29.5 %    MCV 87.4  78.0 - 100.0 fL    MCH 30.1  26.0 - 34.0 pg    MCHC 34.4  30.0 - 36.0 g/dL    RDW 28.4  13.2 - 44.0 %    Platelets 305  150 - 400 K/uL   BASIC METABOLIC PANEL     Status: Abnormal   Collection Time   10/06/11  3:55 AM      Component Value Range Comment   Sodium 135  135 - 145 mEq/L    Potassium 3.5  3.5 - 5.1 mEq/L    Chloride 101  96 - 112 mEq/L    CO2 25  19 - 32 mEq/L    Glucose, Bld 109 (*) 70 - 99 mg/dL    BUN 12  6 - 23 mg/dL    Creatinine, Ser 1.02  0.50 - 1.10 mg/dL    Calcium 8.7  8.4 - 72.5 mg/dL    GFR calc non Af Amer >90  >90 mL/min    GFR calc Af Amer >90  >90 mL/min     Ct Abdomen Pelvis W Contrast  10/06/2011  *RADIOLOGY REPORT*  Clinical Data: Lower abdominal pain  CT ABDOMEN AND PELVIS WITH CONTRAST  Technique:  Multidetector CT imaging of the  abdomen and pelvis was performed following the standard protocol during bolus administration of intravenous contrast.  Contrast: OMNIPAQUE IOHEXOL 300 MG/ML  SOLN  Comparison:  None  Findings: Limited images through the lung bases demonstrate no significant appreciable abnormality. The heart size is within normal limits. No pleural or pericardial effusion.  Hepatic steatosis is suggested.  Unremarkable biliary system, spleen, pancreas, adrenal glands, kidneys.  No hydronephrosis or hydroureter.  No bowel obstruction.  Colonic diverticulosis.  Appendix within normal limits.  No free  intraperitoneal air or fluid.  There is a fat containing umbilical hernia with stranding of the herniated fat.  No lymphadenopathy.  Normal caliber vasculature.  Thin-walled bladder.  Unremarkable CT appearance to the uterus and adnexa.  No acute osseous finding.  IMPRESSION: Fat containing umbilical hernia with associated fat stranding suggests incarceration.  Colonic diverticulosis without CT evidence for diverticulitis.  Original Report Authenticated By: Waneta Martins, M.D.    Review of Systems  Constitutional: Positive for weight loss (Ann Santos has been on [employer] and has lost 35 lbs last 4 months).  HENT: Negative.   Eyes: Negative.   Respiratory: Negative.   Cardiovascular: Negative.   Gastrointestinal: Positive for nausea, abdominal pain and blood in stool. Negative for heartburn, vomiting, diarrhea and constipation.  Genitourinary: Negative.   Musculoskeletal: Negative.   Skin: Negative.   Neurological: Negative.   Endo/Heme/Allergies: Negative.   Psychiatric/Behavioral: The patient is nervous/anxious.    Blood pressure 128/83, pulse 83, temperature 98 F (36.7 C), temperature source Oral, resp. rate 20, height 5\' 2"  (1.575 m), weight 80.287 kg (177 lb), last menstrual period 09/14/2011, SpO2 100.00%. Physical Exam  Constitutional: Ann Santos is oriented to person, place, and time. Ann Santos appears well-developed and well-nourished.       BMI 50.8 Currently complaining of nausea and a headache. Current nausea medicine is not working.  HENT:  Head: Normocephalic and atraumatic.  Nose: Nose normal.  Eyes: Conjunctivae and EOM are normal. Pupils are equal, round, and reactive to light. Right eye exhibits no discharge. Left eye exhibits no discharge. No scleral icterus.  Neck: Normal range of motion. Neck supple. No JVD present. No tracheal deviation present. No thyromegaly present.  Cardiovascular: Normal rate, regular rhythm, normal heart sounds and intact distal pulses.  Exam reveals no  gallop.   No murmur heard. Respiratory: Effort normal and breath sounds normal. No stridor. No respiratory distress. Ann Santos has no wheezes. Ann Santos has no rales. Ann Santos exhibits no tenderness.  GI: Soft. Bowel sounds are normal. Ann Santos exhibits no distension and no mass. There is tenderness (Ann Santos is currently tender just with placing small finger in the umbilicus). There is no rebound and no guarding.       Ann Santos has a large scar under the Panus from her C-section wound infection and failure.  Ann Santos has an umbilical hernia you can only find by placing small finger into umbilicus.  This is where Ann Santos says Ann Santos felt a mass and tenderness earlier this week.  Musculoskeletal: Ann Santos exhibits no edema and no tenderness.  Lymphadenopathy:    Ann Santos has no cervical adenopathy.  Neurological: Ann Santos is alert and oriented to person, place, and time. Ann Santos has normal reflexes. No cranial nerve deficit.  Skin: Skin is warm and dry. No rash noted. No erythema.  Psychiatric: Ann Santos has a normal mood and affect. Her behavior is normal. Judgment and thought content normal.    Assessment/Plan: 1. Umbilical hernia with some incarcerated fat. 2. Diverticulosis without diverticulitis. 3. Probable diverticular bleed 4.  History of anxiety 5. BMI of 50.8  Plan: We'll discuss with Dr. Gerrit Friends. Her primary concern now is for nausea. Her only pain right now is with deep palpation of the umbilicus. At this point I don't think Ann Santos require surgery for the umbilical hernia. [facility] physician assistant for Dr. Darnell Level.  Tawnee Clegg 10/06/2011, 4:41 PM

## 2011-10-06 NOTE — H&P (Addendum)
PCP:  No primary provider on file.   Does not have one, just moved here Was seen at [place] earlier this year.   Chief Complaint:  Abd pain, BRBPR  HPI: 31yoF with h/o prior diverticulitis x2 but no other major comorbidities  presents with abdominal pain and fullness, and BRBPR.   Pt is good historian, states she has recently moved here from [place].  She was in usual state of health on Friday/Saturday, had gone out with  her friends in [place], having some drinks. Then Sunday, drove to [place] and  around that time developed diffuse abdominal discomfort and sensation of  fullness, bloating throughout. Hard to localize but did have some pain  periumbilically and in lower quadrants, but also diffusely. Some nausea,  but no vomiting. BM's were normal during this time, no constipation or  diarrhea at all, until around earlier this week she has developed BRBPR  with BM's that have progressively worsened, but no melena or marroon  stools, she states it's bright red.   Vitals stable. Labs with normal chem, LFT's. WBC 10.6, normal Hgb 13.6.  Fecal occult blood was positive, and rectal exam showed frank blood.  Admission requested bc provider was uncomfortable with the amount of  frank blood on his exam and wanted pt to be observed a bit and  potentially plugged into GI as outpt as well. She is being wheeled off  for a CT abdomen at present. Pt was given 1mg  dilaudid, 4mg  zofran.   Pt denies fevers, chills, sweats either now or in subacute time period.  She has lost 30 lbs recently but intentionally through [employer].  Her dad was recently Dx with Ulcerative Colitis. She is a non-smoker. ROS  otherwise negative. She is sure it's not menstrual blood. She endorses two prior episodes of diverticulitis, but states these were not diagnosed by CT scan. States this is different as prior diverticulitis was much more painful and did not have bleeding associated.      Past Medical History    Diagnosis Date  . Diverticulitis     Per pt. States she never had CT scan to confirm this    No past surgical history on file.  Medications:  HOME MEDS: reconciled  Prior to Admission medications   Medication Sig Start Date End Date Taking? Authorizing Provider  sertraline (ZOLOFT) 100 MG tablet Take 100 mg by mouth daily.   Yes Historical Provider, MD  traZODone (DESYREL) 100 MG tablet Take 100 mg by mouth at bedtime.   Yes Historical Provider, MD    Allergies:  No Known Allergies  Social History:   reports that she has never smoked. She does not have any smokeless tobacco history on file. Her alcohol and drug histories not on file.  She lives at [place] with her husband and has one child. Recently moved here from [place], but husband was living here for a year already. Smokes very seldom cigarettes, drinks socially but not heavily, no drugs. 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   Family History: No family history on file.  Physical Exam: Filed Vitals:   10/05/11 2210 10/05/11 2246 10/05/11 2251 10/05/11 2253  BP: 142/94 137/73 152/88 140/96  Pulse: 81     Temp: 98.2 F (36.8 C)     TempSrc: Oral     Resp: 20     Height: 5\' 2"  (1.575 m)     Weight: 125.646 kg (277 lb)      Blood pressure 140/96, pulse 81, temperature  98.2 F (36.8 C), temperature source Oral, resp. rate 20, height 5\' 2"  (1.575 m), weight 125.646 kg (277 lb), last menstrual period 09/14/2011.  Gen: Large, not ill appearing, fiesty young F in no distress, able to  relate history well, pleasant and joking around, but also a bit  uncomfortable appearing.  HEENT: Pupils round, reactive, EOMI, sclera clear, no jaudnice, mouth  moist and normal appearing Lungs: CTAB grossly, hard to hear breath sounds due to habitus but  grossly normal exam, no cough, increased WOB Heart: Regular, not tachy, S1/2 clear, no m/g heard, normal overall Abd: Obese, soft, not rigid or peritoenal, not really that distended, has  diffuse  facial grimacing to palpation, but not overtly very tender, no  guarding, no masses felt. Feels pretty benign Extrem: Warm, perfsuing well, radials palpable, increased bulk, normal  tone, no BLE edema noted Neuro: Alert, attentive, conversant, speech clear and fluent, face  symmetric and nromal, moves extremities well, grossly non-focal.    Labs & Imaging Results for orders placed during the hospital encounter of 10/05/11 (from the past 48 hour(s))  ABO/RH     Status: Normal   Collection Time   10/05/11 10:21 PM      Component Value Range Comment   ABO/RH(D) A POS     CBC     Status: Abnormal   Collection Time   10/05/11 10:29 PM      Component Value Range Comment   WBC 10.6 (*) 4.0 - 10.5 K/uL    RBC 4.53  3.87 - 5.11 MIL/uL    Hemoglobin 13.6  12.0 - 15.0 g/dL    HCT 13.0  86.5 - 78.4 %    MCV 87.4  78.0 - 100.0 fL    MCH 30.0  26.0 - 34.0 pg    MCHC 34.3  30.0 - 36.0 g/dL    RDW 69.6  29.5 - 28.4 %    Platelets 345  150 - 400 K/uL   COMPREHENSIVE METABOLIC PANEL     Status: Abnormal   Collection Time   10/05/11 10:29 PM      Component Value Range Comment   Sodium 136  135 - 145 mEq/L    Potassium 3.5  3.5 - 5.1 mEq/L    Chloride 101  96 - 112 mEq/L    CO2 25  19 - 32 mEq/L    Glucose, Bld 114 (*) 70 - 99 mg/dL    BUN 13  6 - 23 mg/dL    Creatinine, Ser 1.32  0.50 - 1.10 mg/dL    Calcium 9.3  8.4 - 44.0 mg/dL    Total Protein 7.2  6.0 - 8.3 g/dL    Albumin 3.5  3.5 - 5.2 g/dL    AST 16  0 - 37 U/L    ALT 16  0 - 35 U/L    Alkaline Phosphatase 54  39 - 117 U/L    Total Bilirubin 0.2 (*) 0.3 - 1.2 mg/dL    GFR calc non Af Amer >90  >90 mL/min    GFR calc Af Amer >90  >90 mL/min   TYPE AND SCREEN     Status: Normal   Collection Time   10/05/11 10:29 PM      Component Value Range Comment   ABO/RH(D) A POS      Antibody Screen NEG      Sample Expiration 10/08/2011     OCCULT BLOOD, POC DEVICE     Status: Normal   Collection Time  10/06/11 12:06 AM      Component  Value Range Comment   Fecal Occult Bld POSITIVE      Ct Abdomen Pelvis W Contrast  10/06/2011  *RADIOLOGY REPORT*  Clinical Data: Lower abdominal pain  CT ABDOMEN AND PELVIS WITH CONTRAST  Technique:  Multidetector CT imaging of the abdomen and pelvis was performed following the standard protocol during bolus administration of intravenous contrast.  Contrast: OMNIPAQUE IOHEXOL 300 MG/ML  SOLN  Comparison:  None  Findings: Limited images through the lung bases demonstrate no significant appreciable abnormality. The heart size is within normal limits. No pleural or pericardial effusion.  Hepatic steatosis is suggested.  Unremarkable biliary system, spleen, pancreas, adrenal glands, kidneys.  No hydronephrosis or hydroureter.  No bowel obstruction.  Colonic diverticulosis.  Appendix within normal limits.  No free intraperitoneal air or fluid.  There is a fat containing umbilical hernia with stranding of the herniated fat.  No lymphadenopathy.  Normal caliber vasculature.  Thin-walled bladder.  Unremarkable CT appearance to the uterus and adnexa.  No acute osseous finding.  IMPRESSION: Fat containing umbilical hernia with associated fat stranding suggests incarceration.  Colonic diverticulosis without CT evidence for diverticulitis.  Original Report Authenticated By: Waneta Martins, M.D.    Impression Present on Admission:  .BRBPR (bright red blood per rectum) .Abdominal pain  31yoF with h/o prior diverticulitis x2 but no other major comorbidities  presents with abdominal pain and fullness, and BRBPR.   1. Abd pain, fullness, and BRBPR: Reasonable DDx includes infectious  hemorrhagic diarrhea (Ecoli, salmonella, campylobacter, shigella, etc.),  IBD (father recently Dx with UC), diverticulitis/diverticulosis, bleeding  hemorrhoid, etc. Hemodynamics stable and Hgb stable.   - Admit observation level.  - Going for CT scan abd now. NPO, bowel rest, IVF's, pain control. Trend  Hgb.  - If CT  scan doesn't show obvious etiology, would then order stool  cultures. No risk factors for CDiff, will not order.   No subQ heparin Regular bed, [facility] team 5 Presumed full code    Shawntae Lowy 10/06/2011, 1:50 AM    Addendum: CT scan shows fat containing umbilical hernia, possibly incarcerated, and diverticulosis. Suspect the BRBPR is benign diverticulosis for which we'll just monitor Hgb for now. Not sure how extensively the fat containing hernia is causing all of her symptoms -- certainly it's causing the periumbilical pain but without gut involvement, not sure how much it explains her feelings of fullness, bloating, nausea, vomiting. She is in pain but not frankly ill or toxic. Would consider surgical consultation to evaluate how urgently this needs to be addressed.

## 2011-10-06 NOTE — ED Notes (Signed)
MD at bedside. Dr. Dierdre Highman and Dr. Kaylyn Layer at bedside.

## 2011-10-07 ENCOUNTER — Encounter (HOSPITAL_COMMUNITY)
Admission: EM | Disposition: A | Discharge status: Home / Self Care | Payer: Self-pay | Source: Home / Self Care | Attending: Internal Medicine

## 2011-10-07 ENCOUNTER — Encounter (HOSPITAL_COMMUNITY): Payer: Self-pay

## 2011-10-07 DIAGNOSIS — K922 Gastrointestinal hemorrhage, unspecified: Secondary | ICD-10-CM

## 2011-10-07 DIAGNOSIS — K429 Umbilical hernia without obstruction or gangrene: Secondary | ICD-10-CM

## 2011-10-07 DIAGNOSIS — R1033 Periumbilical pain: Secondary | ICD-10-CM

## 2011-10-07 HISTORY — PX: ESOPHAGOGASTRODUODENOSCOPY: SHX5428

## 2011-10-07 HISTORY — PX: COLONOSCOPY: SHX5424

## 2011-10-07 SURGERY — COLONOSCOPY
Anesthesia: Moderate Sedation

## 2011-10-07 MED ORDER — MIDAZOLAM HCL 10 MG/2ML IJ SOLN
INTRAMUSCULAR | Status: DC | PRN
  Administered 2011-10-07 (×4): 2 mg via INTRAVENOUS

## 2011-10-07 MED ORDER — MIDAZOLAM HCL 10 MG/2ML IJ SOLN
INTRAMUSCULAR | Status: AC
  Filled 2011-10-07: qty 4

## 2011-10-07 MED ORDER — SODIUM CHLORIDE 0.9 % IV SOLN
Freq: Once | INTRAVENOUS | Status: AC
  Administered 2011-10-07: 13:00:00 via INTRAVENOUS

## 2011-10-07 MED ORDER — FENTANYL CITRATE 0.05 MG/ML IJ SOLN
INTRAMUSCULAR | Status: AC
  Filled 2011-10-07: qty 4

## 2011-10-07 MED ORDER — BUTAMBEN-TETRACAINE-BENZOCAINE 2-2-14 % EX AERO
INHALATION_SPRAY | CUTANEOUS | Status: DC | PRN
  Administered 2011-10-07: 1 via TOPICAL

## 2011-10-07 MED ORDER — DIPHENHYDRAMINE HCL 50 MG/ML IJ SOLN
INTRAMUSCULAR | Status: AC
  Filled 2011-10-07: qty 1

## 2011-10-07 MED ORDER — DIPHENHYDRAMINE HCL 50 MG/ML IJ SOLN
INTRAMUSCULAR | Status: DC | PRN
  Administered 2011-10-07: 25 mg via INTRAVENOUS

## 2011-10-07 MED ORDER — FENTANYL CITRATE 0.05 MG/ML IJ SOLN
INTRAMUSCULAR | Status: DC | PRN
  Administered 2011-10-07 (×4): 25 ug via INTRAVENOUS

## 2011-10-07 NOTE — Consult Note (Signed)
[facility] - Attending  Two issues - mildly symptomatic umbilical hernia with incarcerated fat, and probable GI bleed secondary to diverticular disease.  GI planning colonoscopy.  Will follow.  Velora Heckler, MD, [facility] Office: [phone]

## 2011-10-07 NOTE — Progress Notes (Signed)
  Subjective: Going for colonoscopy now.  Not really complaining of her hernia, but it's still tender to touch  Objective: Vital signs in last 24 hours: Temp:  [98 F (36.7 C)-98.2 F (36.8 C)] 98.1 F (36.7 C) (06/14 0455) Pulse Rate:  [64-83] 64  (06/14 0455) Resp:  [16-20] 16  (06/14 0455) BP: (126-141)/(65-83) 141/82 mmHg (06/14 0455) SpO2:  [97 %-100 %] 97 % (06/14 0455) Last BM Date: 10/07/11  Afebrile, VSS, no labs,  Intake/Output from previous day: 06/13 0701 - 06/14 0700 In: 240 [P.O.:240] Out: 650 [Urine:650] Intake/Output this shift:    General appearance: alert, cooperative and no distress GI: soft, tender to palpation of the umbilicus, +BS no distension  Lab Results:   Basename 10/06/11 0355 10/05/11 2229  WBC 9.2 10.6*  HGB 12.9 13.6  HCT 37.5 39.6  PLT 305 345    BMET  Basename 10/06/11 0355 10/05/11 2229  NA 135 136  K 3.5 3.5  CL 101 101  CO2 25 25  GLUCOSE 109* 114*  BUN 12 13  CREATININE 0.56 0.58  CALCIUM 8.7 9.3   PT/INR No results found for this basename: LABPROT:2,INR:2 in the last 72 hours   Lab 10/05/11 2229  AST 16  ALT 16  ALKPHOS 54  BILITOT 0.2*  PROT 7.2  ALBUMIN 3.5     Lipase  No results found for this basename: lipase     Studies/Results: Ct Abdomen Pelvis W Contrast  10/06/2011  *RADIOLOGY REPORT*  Clinical Data: Lower abdominal pain  CT ABDOMEN AND PELVIS WITH CONTRAST  Technique:  Multidetector CT imaging of the abdomen and pelvis was performed following the standard protocol during bolus administration of intravenous contrast.  Contrast: OMNIPAQUE IOHEXOL 300 MG/ML  SOLN  Comparison:  None  Findings: Limited images through the lung bases demonstrate no significant appreciable abnormality. The heart size is within normal limits. No pleural or pericardial effusion.  Hepatic steatosis is suggested.  Unremarkable biliary system, spleen, pancreas, adrenal glands, kidneys.  No hydronephrosis or hydroureter.  No  bowel obstruction.  Colonic diverticulosis.  Appendix within normal limits.  No free intraperitoneal air or fluid.  There is a fat containing umbilical hernia with stranding of the herniated fat.  No lymphadenopathy.  Normal caliber vasculature.  Thin-walled bladder.  Unremarkable CT appearance to the uterus and adnexa.  No acute osseous finding.  IMPRESSION: Fat containing umbilical hernia with associated fat stranding suggests incarceration.  Colonic diverticulosis without CT evidence for diverticulitis.  Original Report Authenticated By: Waneta Martins, M.D.    Medications:    . docusate sodium  100 mg Oral BID  . polyethylene glycol-electrolytes  4,000 mL Oral Once  . senna  1 tablet Oral BID  . sertraline  100 mg Oral Daily  . traZODone  100 mg Oral QHS    Assessment/Plan 1. Umbilical hernia with some incarcerated fat.  2. Diverticulosis without diverticulitis.  3. Probable diverticular bleed  4. History of anxiety  5. BMI of 50.8   Working up diverticular dz.  Dr. Gerrit Friends will see later today.  LOS: 2 days    Ann Santos 10/07/2011

## 2011-10-07 NOTE — Op Note (Signed)
[facility] [address] [place], [place]  [address]  OPERATIVE PROCEDURE REPORT  PATIENT:  Ann Santos, Ann Santos  MR#:  [id number] BIRTHDATE:  Sep 06, 1980  GENDER:  female ENDOSCOPIST:  Jeani Hawking, MD PROCEDURE DATE:  10/07/2011 PROCEDURE:  EGD, diagnostic 43235 ASA CLASS:  Class II INDICATIONS:  GI bleed and nausea MEDICATIONS:  Fentanyl 75 mcg IV, Versed 6 mg IV, Benadryl 25 mg IV TOPICAL ANESTHETIC:  Cetacaine Spray  DESCRIPTION OF PROCEDURE:   After the risks benefits and alternatives of the procedure were thoroughly explained, informed consent was obtained.  The  endoscope was introduced through the mouth and advanced to the second portion of the duodenum, without limitations.  The instrument was slowly withdrawn as the mucosa was fully examined. <<PROCEDUREIMAGES>>  FINDINGS:   A few small insignificant antral erosions were identified. No other abnormalities noted.    Retroflexed views revealed no abnormalities.    The scope was then withdrawn from the patient and the procedure terminated.  COMPLICATIONS:  None  IMPRESSION:  1) Mild gastritis RECOMMENDATIONS:  1) Proceed with the colonoscopy.  ______________________________ Jeani Hawking, MD  n. Rosalie DoctorJeani Hawking at 10/07/2011 02:01 PM  [place], [place], [phone]

## 2011-10-07 NOTE — H&P (View-Only) (Signed)
[facility] PROGRESS NOTE  Ann Santos MRN:[id number] DOB: 01/22/1981 DOA: 10/05/2011 PCP: No primary provider on file.  Assessment/Plan:  Principal Problem:  *BRBPR (bright red blood per rectum); probably secondary to diverticulosis.  No diverticulitis. GI consult called, as this is her 3 rd episode in the last one year and she has family h/o of UC, she might need diverticulosis.  Iv anti emetics Clear liquid diet. Serial H&H. Pain control  Active Problems:  Abdominal pain: ? Incarcerated umbilical hernia . Surgery called to see if she needs any intervention at this time.   Code Status:FULL CODE  Ann Coryell, MD  [facility] Pager [phone]  If 7PM-7AM, please contact night-coverage [email or web address] Password [email or web address] 10/06/2011, 11:24 AM   LOS: 1 day   Brief narrative: 31yoF with h/o prior diverticulitis x2 but no other major comorbidities  presents with abdominal pain and fullness, and BRBPR.    Consultants:  GI CONSULT  SURGERY CONSULT  Procedures:  CT ABD AND PELVIS.  Antibiotics:  NONE  HPI/Subjective: Slightly nauseated.   Objective: Filed Vitals:   10/06/11 0305 10/06/11 0312 10/06/11 0314 10/06/11 0727  BP: 140/84 123/78    Pulse: 83 77    Temp:  98.8 F (37.1 C)    TempSrc:  Oral    Resp: 16 18    Height:   5' 2" (1.575 m)   Weight:   125.646 kg (277 lb) 80.287 kg (177 lb)  SpO2: 99% 96%     No intake or output data in the 24 hours ending 10/06/11 1124  Exam:   General:  ALERT AFEBRILE comfortable  Cardiovascular: S1S2 normal RRR, no MRG  Respiratory: CTAB, no wheezing or rhonchi  Abdomen: soft , mildly tender in the umbilicus area. BS+  Data Reviewed: Basic Metabolic Panel:  Lab 10/06/11 0355 10/05/11 2229  NA 135 136  K 3.5 3.5  CL 101 101  CO2 25 25  GLUCOSE 109* 114*  BUN 12 13  CREATININE 0.56 0.58  CALCIUM 8.7 9.3  MG -- --  PHOS -- --   Liver Function Tests:  Lab 10/05/11 2229  AST 16  ALT  16  ALKPHOS 54  BILITOT 0.2*  PROT 7.2  ALBUMIN 3.5   No results found for this basename: LIPASE:5,AMYLASE:5 in the last 168 hours No results found for this basename: AMMONIA:5 in the last 168 hours CBC:  Lab 10/06/11 0355 10/05/11 2229  WBC 9.2 10.6*  NEUTROABS -- --  HGB 12.9 13.6  HCT 37.5 39.6  MCV 87.4 87.4  PLT 305 345   Cardiac Enzymes: No results found for this basename: CKTOTAL:5,CKMB:5,CKMBINDEX:5,TROPONINI:5 in the last 168 hours BNP (last 3 results) No results found for this basename: PROBNP:3 in the last 8760 hours CBG: No results found for this basename: GLUCAP:5 in the last 168 hours  No results found for this or any previous visit (from the past 240 hour(s)).   Studies: Ct Abdomen Pelvis W Contrast  10/06/2011  *RADIOLOGY REPORT*  Clinical Data: Lower abdominal pain  CT ABDOMEN AND PELVIS WITH CONTRAST  Technique:  Multidetector CT imaging of the abdomen and pelvis was performed following the standard protocol during bolus administration of intravenous contrast.  Contrast: 100mL OMNIPAQUE IOHEXOL 300 MG/ML  SOLN  Comparison:  None  Findings: Limited images through the lung bases demonstrate no significant appreciable abnormality. The heart size is within normal limits. No pleural or pericardial effusion.  Hepatic steatosis is suggested.  Unremarkable biliary system, spleen, pancreas, adrenal glands, kidneys.  No   hydronephrosis or hydroureter.  No bowel obstruction.  Colonic diverticulosis.  Appendix within normal limits.  No free intraperitoneal air or fluid.  There is a fat containing umbilical hernia with stranding of the herniated fat.  No lymphadenopathy.  Normal caliber vasculature.  Thin-walled bladder.  Unremarkable CT appearance to the uterus and adnexa.  No acute osseous finding.  IMPRESSION: Fat containing umbilical hernia with associated fat stranding suggests incarceration.  Colonic diverticulosis without CT evidence for diverticulitis.  Original Report  Authenticated By: ANDREW J. DELGAIZO, M.D.    Scheduled Meds:   . sodium chloride   Intravenous Once  . docusate sodium  100 mg Oral BID  .  HYDROmorphone (DILAUDID) injection  1 mg Intravenous Once  . ondansetron  4 mg Intravenous Once  . senna  1 tablet Oral BID  . sertraline  100 mg Oral Daily  . traZODone  100 mg Oral QHS   Continuous Infusions:           

## 2011-10-07 NOTE — Progress Notes (Signed)
Patient received discharge instructions with husband at bedside. Both verbalized understanding without further questions. Patient belongings packed. Patient to ambulate downstairs with husband to be discharged home. Instructed to follow up with PCP in 2-4 weeks for labs.

## 2011-10-07 NOTE — Progress Notes (Signed)
[facility] PROGRESS NOTE  Ann Santos ZOX:[id number] DOB: 04-13-1981 DOA: 10/05/2011 PCP: No primary provider on file.  Assessment/Plan:  Diverticular Bleed:  *BRBPR (bright red blood per rectum); probably secondary to diverticulosis.  No diverticulitis. GI consult called, as this is her 3 rd episode in the last one year and she has family h/o of UC,  She is going for colonoscopy and EGD. Clear liquid diet. Serial H&H. Pain control  Active Problems:  Abdominal pain: ? Incarcerated umbilical hernia . Surgery called to see if she needs any intervention at this time.   Code Status:FULL CODE  Kathlen Mody, MD  [facility] Pager [phone]  If 7PM-7AM, please contact night-coverage [email or web address] Password [email or web address] 10/07/2011, 1:45 PM   LOS: 2 days   Brief narrative: 31yoF with h/o prior diverticulitis x2 but no other major comorbidities  presents with abdominal pain and fullness, and BRBPR.    Consultants:  GI CONSULT  SURGERY CONSULT  Procedures:  CT ABD AND PELVIS.  Antibiotics:  NONE  HPI/Subjective: Slightly nauseated.   Objective: Filed Vitals:   10/06/11 2110 10/06/11 2211 10/07/11 0455 10/07/11 1315  BP: 126/65  141/82 138/89  Pulse: 78  64 70  Temp: 98.2 F (36.8 C)  98.1 F (36.7 C) 98.3 F (36.8 C)  TempSrc: Oral  Oral Oral  Resp: 16 16 16 20   Height:      Weight:      SpO2: 97% 97% 97% 98%    Intake/Output Summary (Last 24 hours) at 10/07/11 1345 Last data filed at 10/07/11 0855  Gross per 24 hour  Intake    240 ml  Output    650 ml  Net   -410 ml    Exam:   General:  ALERT AFEBRILE comfortable  Cardiovascular: S1S2 normal RRR, no MRG  Respiratory: CTAB, no wheezing or rhonchi  Abdomen: soft , mildly tender in the umbilicus area. BS+  Data Reviewed: Basic Metabolic Panel:  Lab 10/06/11 8295 10/05/11 2229  NA 135 136  K 3.5 3.5  CL 101 101  CO2 25 25  GLUCOSE 109* 114*  BUN 12 13  CREATININE 0.56  0.58  CALCIUM 8.7 9.3  MG -- --  PHOS -- --   Liver Function Tests:  Lab 10/05/11 2229  AST 16  ALT 16  ALKPHOS 54  BILITOT 0.2*  PROT 7.2  ALBUMIN 3.5   No results found for this basename: LIPASE:5,AMYLASE:5 in the last 168 hours No results found for this basename: AMMONIA:5 in the last 168 hours CBC:  Lab 10/06/11 0355 10/05/11 2229  WBC 9.2 10.6*  NEUTROABS -- --  HGB 12.9 13.6  HCT 37.5 39.6  MCV 87.4 87.4  PLT 305 345   Cardiac Enzymes: No results found for this basename: CKTOTAL:5,CKMB:5,CKMBINDEX:5,TROPONINI:5 in the last 168 hours BNP (last 3 results) No results found for this basename: PROBNP:3 in the last 8760 hours CBG: No results found for this basename: GLUCAP:5 in the last 168 hours  No results found for this or any previous visit (from the past 240 hour(s)).   Studies: Ct Abdomen Pelvis W Contrast  10/06/2011  *RADIOLOGY REPORT*  Clinical Data: Lower abdominal pain  CT ABDOMEN AND PELVIS WITH CONTRAST  Technique:  Multidetector CT imaging of the abdomen and pelvis was performed following the standard protocol during bolus administration of intravenous contrast.  Contrast: OMNIPAQUE IOHEXOL 300 MG/ML  SOLN  Comparison:  None  Findings: Limited images through the lung bases demonstrate no significant appreciable abnormality. The  heart size is within normal limits. No pleural or pericardial effusion.  Hepatic steatosis is suggested.  Unremarkable biliary system, spleen, pancreas, adrenal glands, kidneys.  No hydronephrosis or hydroureter.  No bowel obstruction.  Colonic diverticulosis.  Appendix within normal limits.  No free intraperitoneal air or fluid.  There is a fat containing umbilical hernia with stranding of the herniated fat.  No lymphadenopathy.  Normal caliber vasculature.  Thin-walled bladder.  Unremarkable CT appearance to the uterus and adnexa.  No acute osseous finding.  IMPRESSION: Fat containing umbilical hernia with associated fat stranding  suggests incarceration.  Colonic diverticulosis without CT evidence for diverticulitis.  Original Report Authenticated By: Waneta Martins, M.D.    Scheduled Meds:    . sodium chloride   Intravenous Once  . docusate sodium  100 mg Oral BID  . polyethylene glycol-electrolytes  4,000 mL Oral Once  . senna  1 tablet Oral BID  . sertraline  100 mg Oral Daily  . traZODone  100 mg Oral QHS   Continuous Infusions:

## 2011-10-07 NOTE — Progress Notes (Signed)
General Surgery [facility] - Attending  Results of EGD and colonoscopy reviewed.  Discussed with patient.  Patient examined.  Ready for discharge.  Will arrange follow up at [facility] office for umbilical hernia in near future.  Patient desires repair sometime this summer.  Velora Heckler, MD, [facility] Office: [phone]

## 2011-10-07 NOTE — Op Note (Signed)
[facility] [address] [place], [place]  [address]  OPERATIVE PROCEDURE REPORT  PATIENT:  Ann Santos, Ann Santos  MR#:  [id number] BIRTHDATE:  1981-02-22  GENDER:  female ENDOSCOPIST:  Jeani Hawking, MD PROCEDURE DATE:  10/07/2011 PROCEDURE:  Diagnostic Colonoscopy ASA CLASS:  Class II INDICATIONS:  Hematochezia MEDICATIONS:  Fentanyl 25 mcg IV, Versed 2 mg IV  DESCRIPTION OF PROCEDURE:   After the risks benefits and alternatives of the procedure were thoroughly explained, informed consent was obtained.  Digital rectal exam was performed and revealed no abnormalities.   The  endoscope was introduced through the anus and advanced to the terminal ileum which was intubated for a short distance, without limitations.  The quality of the prep was excellent..  The instrument was then slowly withdrawn as the colon was fully examined. <<PROCEDUREIMAGES>>  FINDINGS:  Diverticula were noted throughout the entire colon. No evidence of any inflammation, ulcerations, erosions, polyps, masses, or vascular abnormalities.   Retroflexed views in the rectum revealed internal and external hemorrhoids.    The scope was then withdrawn from the patient and the procedure terminated.  COMPLICATIONS:  None  IMPRESSION:  1) Diverticula, scattered.  Probable diverticular bleed. 2) Internal and external hemorrhoids RECOMMENDATIONS:  1) No further GI evaluation. 2) Okay to D/C Home.  ______________________________ Jeani Hawking, MD  n. Rosalie DoctorJeani Hawking at 10/07/2011 02:17 PM  [place], [place], [phone]

## 2011-10-07 NOTE — Interval H&P Note (Signed)
History and Physical Interval Note:  10/07/2011 1:32 PM  Ann Santos  has presented today for surgery, with the diagnosis of Hematochezia  The various methods of treatment have been discussed with the patient and family. After consideration of risks, benefits and other options for treatment, the patient has consented to  Procedure(s) (LRB): COLONOSCOPY, ESOPHAGOGASTRODUODENOSCOPY (EGD) AND ESOPHAGEAL DILATION (Left) as a surgical intervention .  The patients' history has been reviewed, patient examined, no change in status, stable for surgery.  I have reviewed the patients' chart and labs.  Questions were answered to the patient's satisfaction.     Ann Santos

## 2011-10-09 NOTE — Discharge Summary (Signed)
Physician Discharge Summary  Ann Santos ZOX:[id number] DOB: [birth year] DOA: 10/05/2011  PCP: No primary provider on file.  Admit date: 10/05/2011 Discharge date: 10/09/2011  Discharge Diagnoses:  1. Diverticular bleed 2. Mild gastritis 3. Diverticulosis 4. Umbilical hernia.  Discharge Condition: stable.  Diet recommendation: regular.   History of present illness:  31yoF with h/o prior diverticulitis x2 but no other major comorbidities  presents with abdominal pain and fullness, and BRBPR.    Hospital Course:  Diverticular Bleed:  *BRBPR (bright red blood per rectum); probably secondary to diverticulosis.  No diverticulitis.  GI consult called, as this is her 3 rd episode in the last one year and she has family h/o of UC,  She underwent colonoscopy and EGD, showed moderate diverticulosis and mild gastritis. She was started on regular diet and she tolerated without symptoms and she was discharged home.     Abdominal pain: ? Incarcerated umbilical hernia .  Surgery called to see if she needs any intervention at this time. Surgery recommended no further interventions at this time.     Procedures:  Ct abdomen and pelvis  Colonoscopy  EGD.  Consultations:  Gi consult  Discharge Exam: Filed Vitals:   10/07/11 1514  BP: 129/85  Pulse:   Temp: 98 F (36.7 C)  Resp: 18   Filed Vitals:   10/07/11 1424 10/07/11 1430 10/07/11 1440 10/07/11 1514  BP: 104/54 104/54 102/63 129/85  Pulse:      Temp:    98 F (36.7 C)  TempSrc:    Oral  Resp: 17 17 11 18   Height:      Weight:      SpO2: 97% 97% 97% 100%   General: ALERT afebrile comfortable Cardiovascular: s1s2 RRR,  Respiratory: CTAB Abdomen: soft NT ND BS+ Discharge Instructions  Discharge Orders    Future Orders Please Complete By Expires   Diet general      Discharge instructions      Comments:   Follow up with PCP in 2 to 4 weeks and check CBC at the time of the visit.   Activity as tolerated - No  restrictions        Medication List  As of 10/09/2011  6:27 PM   TAKE these medications         sertraline 100 MG tablet   Commonly known as: ZOLOFT   Take 100 mg by mouth daily.      traZODone 100 MG tablet   Commonly known as: DESYREL   Take 100 mg by mouth at bedtime.              The results of significant diagnostics from this hospitalization (including imaging, microbiology, ancillary and laboratory) are listed below for reference.    Significant Diagnostic Studies: Ct Abdomen Pelvis W Contrast  10/06/2011  *RADIOLOGY REPORT*  Clinical Data: Lower abdominal pain  CT ABDOMEN AND PELVIS WITH CONTRAST  Technique:  Multidetector CT imaging of the abdomen and pelvis was performed following the standard protocol during bolus administration of intravenous contrast.  Contrast: OMNIPAQUE IOHEXOL 300 MG/ML  SOLN  Comparison:  None  Findings: Limited images through the lung bases demonstrate no significant appreciable abnormality. The heart size is within normal limits. No pleural or pericardial effusion.  Hepatic steatosis is suggested.  Unremarkable biliary system, spleen, pancreas, adrenal glands, kidneys.  No hydronephrosis or hydroureter.  No bowel obstruction.  Colonic diverticulosis.  Appendix within normal limits.  No free intraperitoneal air or fluid.  There is a  fat containing umbilical hernia with stranding of the herniated fat.  No lymphadenopathy.  Normal caliber vasculature.  Thin-walled bladder.  Unremarkable CT appearance to the uterus and adnexa.  No acute osseous finding.  IMPRESSION: Fat containing umbilical hernia with associated fat stranding suggests incarceration.  Colonic diverticulosis without CT evidence for diverticulitis.  Original Report Authenticated By: Waneta Martins, M.D.    Microbiology: No results found for this or any previous visit (from the past 240 hour(s)).   Labs: Basic Metabolic Panel:  Lab 10/06/11 1610 10/05/11 2229  NA 135 136  K  3.5 3.5  CL 101 101  CO2 25 25  GLUCOSE 109* 114*  BUN 12 13  CREATININE 0.56 0.58  CALCIUM 8.7 9.3  MG -- --  PHOS -- --   Liver Function Tests:  Lab 10/05/11 2229  AST 16  ALT 16  ALKPHOS 54  BILITOT 0.2*  PROT 7.2  ALBUMIN 3.5   No results found for this basename: LIPASE:5,AMYLASE:5 in the last 168 hours No results found for this basename: AMMONIA:5 in the last 168 hours CBC:  Lab 10/06/11 0355 10/05/11 2229  WBC 9.2 10.6*  NEUTROABS -- --  HGB 12.9 13.6  HCT 37.5 39.6  MCV 87.4 87.4  PLT 305 345   Cardiac Enzymes: No results found for this basename: CKTOTAL:5,CKMB:5,CKMBINDEX:5,TROPONINI:5 in the last 168 hours BNP: BNP (last 3 results) No results found for this basename: PROBNP:3 in the last 8760 hours CBG: No results found for this basename: GLUCAP:5 in the last 168 hours  Time coordinating discharge: 64 minutes.   SignedKathlen Mody, MD  [facility] 10/09/2011, 6:27 PM

## 2011-10-10 ENCOUNTER — Encounter (HOSPITAL_COMMUNITY): Payer: Self-pay | Admitting: Gastroenterology

## 2011-10-26 ENCOUNTER — Encounter (INDEPENDENT_AMBULATORY_CARE_PROVIDER_SITE_OTHER): Payer: Self-pay | Admitting: Surgery

## 2011-10-26 ENCOUNTER — Encounter (HOSPITAL_COMMUNITY): Payer: Self-pay | Admitting: Pharmacy Technician

## 2011-10-26 ENCOUNTER — Ambulatory Visit (INDEPENDENT_AMBULATORY_CARE_PROVIDER_SITE_OTHER): Payer: 59 | Admitting: Physician Assistant

## 2011-10-26 ENCOUNTER — Ambulatory Visit (INDEPENDENT_AMBULATORY_CARE_PROVIDER_SITE_OTHER): Payer: 59 | Admitting: Surgery

## 2011-10-26 ENCOUNTER — Encounter (HOSPITAL_COMMUNITY): Payer: Self-pay | Admitting: *Deleted

## 2011-10-26 VITALS — BP 116/82 | HR 70 | Temp 97.4°F | Resp 14 | Ht 62.0 in | Wt 283.4 lb

## 2011-10-26 VITALS — BP 122/78 | HR 79 | Temp 98.6°F | Resp 16 | Ht 62.5 in | Wt 282.0 lb

## 2011-10-26 DIAGNOSIS — N644 Mastodynia: Secondary | ICD-10-CM

## 2011-10-26 DIAGNOSIS — F419 Anxiety disorder, unspecified: Secondary | ICD-10-CM

## 2011-10-26 DIAGNOSIS — Z01419 Encounter for gynecological examination (general) (routine) without abnormal findings: Secondary | ICD-10-CM

## 2011-10-26 DIAGNOSIS — R5381 Other malaise: Secondary | ICD-10-CM

## 2011-10-26 DIAGNOSIS — E669 Obesity, unspecified: Secondary | ICD-10-CM

## 2011-10-26 DIAGNOSIS — Z13220 Encounter for screening for lipoid disorders: Secondary | ICD-10-CM

## 2011-10-26 DIAGNOSIS — Z111 Encounter for screening for respiratory tuberculosis: Secondary | ICD-10-CM

## 2011-10-26 DIAGNOSIS — K429 Umbilical hernia without obstruction or gangrene: Secondary | ICD-10-CM | POA: Insufficient documentation

## 2011-10-26 DIAGNOSIS — R5383 Other fatigue: Secondary | ICD-10-CM

## 2011-10-26 DIAGNOSIS — Z0000 Encounter for general adult medical examination without abnormal findings: Secondary | ICD-10-CM

## 2011-10-26 DIAGNOSIS — Z23 Encounter for immunization: Secondary | ICD-10-CM

## 2011-10-26 DIAGNOSIS — Z0184 Encounter for antibody response examination: Secondary | ICD-10-CM

## 2011-10-26 LAB — POCT CBC
Granulocyte percent: 66.9 %G (ref 37–80)
HCT, POC: 41.4 % (ref 37.7–47.9)
Hemoglobin: 13 g/dL (ref 12.2–16.2)
Lymph, poc: 2 (ref 0.6–3.4)
MCH, POC: 29.1 pg (ref 27–31.2)
MCHC: 31.4 g/dL — AB (ref 31.8–35.4)
MCV: 92.7 fL (ref 80–97)
MID (cbc): 0.3 (ref 0–0.9)
MPV: 7.8 fL (ref 0–99.8)
POC Granulocyte: 4.7 (ref 2–6.9)
POC LYMPH PERCENT: 28.6 %L (ref 10–50)
POC MID %: 4.5 %M (ref 0–12)
Platelet Count, POC: 387 10*3/uL (ref 142–424)
RBC: 4.47 M/uL (ref 4.04–5.48)
RDW, POC: 12.7 %
WBC: 7.1 10*3/uL (ref 4.6–10.2)

## 2011-10-26 LAB — POCT URINE PREGNANCY: Preg Test, Ur: NEGATIVE

## 2011-10-26 LAB — POCT URINALYSIS DIPSTICK
Bilirubin, UA: NEGATIVE
Blood, UA: NEGATIVE
Glucose, UA: NEGATIVE
Ketones, UA: NEGATIVE
Nitrite, UA: NEGATIVE
Protein, UA: NEGATIVE
Spec Grav, UA: 1.02
Urobilinogen, UA: 0.2
pH, UA: 6

## 2011-10-26 MED ORDER — FLUTICASONE PROPIONATE 50 MCG/ACT NA SUSP: 2.0000 | Freq: Every day | NASAL | Status: DC

## 2011-10-26 MED ORDER — RIZATRIPTAN BENZOATE 10 MG PO TBDP: 10.0000 mg | ORAL_TABLET | ORAL | Status: DC | PRN

## 2011-10-26 NOTE — Progress Notes (Signed)
Subjective:    Patient ID: Ann Santos, female    DOB: Sep 05, 1980, 31 y.o.   MRN: [id number]  HPI Here for CPE.  See scanned in form.  Overdue for pap.  She does SBE.  Health/FH all updated. Immunization record reviewed.  Not using any birthcontrol-ok with it if she gets pregnant. 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 She is currently enrolled in weight watchers and is successfully losing weight.  Review of Systems  All other systems reviewed and are negative.       Objective:   Physical Exam  Nursing note and vitals reviewed. Constitutional: She is oriented to person, place, and time. She appears well-developed and well-nourished.       Morbidly obese  HENT:  Head: Normocephalic and atraumatic.  Right Ear: External ear normal.  Left Ear: External ear normal.  Nose: Nose normal.  Mouth/Throat: Oropharynx is clear and moist. No oropharyngeal exudate.  Eyes: Conjunctivae and EOM are normal. Pupils are equal, round, and reactive to light.  Neck: Normal range of motion. Neck supple. No JVD present. No tracheal deviation present. No thyromegaly present.  Cardiovascular: Normal rate, regular rhythm, normal heart sounds and intact distal pulses.  Exam reveals no gallop and no friction rub.   No murmur heard. Pulmonary/Chest: Effort normal and breath sounds normal. She has no wheezes. Right breast exhibits no inverted nipple, no mass, no nipple discharge, no skin change and no tenderness. Left breast exhibits no inverted nipple, no mass, no nipple discharge, no skin change and no tenderness.  Abdominal: Soft. Bowel sounds are normal.  Genitourinary: Vagina normal and uterus normal. No vaginal discharge found.       Pap taken. Cervix with minimal friability.  Musculoskeletal: Normal range of motion.  Lymphadenopathy:       Head (right side): No tonsillar, no preauricular, no posterior auricular and no occipital adenopathy present.      Head (left side): No tonsillar, no preauricular, no posterior auricular and no occipital adenopathy present.    She has no cervical adenopathy.    She has no axillary adenopathy.       Right: No supraclavicular adenopathy present.       Left: No supraclavicular adenopathy present.  Neurological: She is alert and oriented to person, place, and time. She has normal reflexes.  Skin: Skin is warm and dry.  Psychiatric: She has a normal mood and affect. Her behavior is normal. Judgment and thought content normal.   Results for orders placed in visit on 10/26/11  POCT CBC      Component Value Range   WBC 7.1  4.6 - 10.2 K/uL   Lymph, poc 2.0  0.6 - 3.4   POC LYMPH PERCENT 28.6  10 - 50 %L   MID (cbc) 0.3  0 - 0.9   POC MID % 4.5  0 - 12 %M   POC Granulocyte 4.7  2 - 6.9   Granulocyte percent 66.9  37 - 80 %G   RBC 4.47  4.04 - 5.48 M/uL   Hemoglobin 13.0  12.2 - 16.2 g/dL   HCT, POC 13.0  86.5 - 47.9 %   MCV 92.7  80 - 97 fL   MCH, POC 29.1  27 - 31.2 pg   MCHC 31.4 (*) 31.8 - 35.4 g/dL   RDW, POC 78.4     Platelet Count, POC 387  142 - 424 K/uL   MPV 7.8  0 - 99.8 fL  POCT URINALYSIS DIPSTICK      Component Value Range   Color, UA yellow     Clarity, UA hazy     Glucose, UA neg     Bilirubin, UA neg     Ketones, UA neg     Spec Grav, UA 1.020     Blood, UA neg     pH, UA 6.0     Protein, UA neg     Urobilinogen, UA 0.2     Nitrite, UA neg     Leukocytes, UA Trace    POCT URINE PREGNANCY      Component Value Range   Preg Test, Ur Negative          Assessment & Plan:  CPE-normal exam and gyn exam Fatigue Breast tenderness (premenstrual) Cholesterol screening Obesity-continue weight watchers and increase exercise. tdap updated and TB skin test place. She may need mmr and Hep B series-await titers Occasional migraine headaches-h/o maxalt w/ success

## 2011-10-26 NOTE — Progress Notes (Signed)
General Surgery [facility]  Chief Complaint  Patient presents with  . Umbilical Hernia    follow up from in-patient evaluation - needs to schedule repair    HISTORY: Patient is a 31 year old white female initially evaluated with during hospitalization for diverticular disease. Patient was found on physical examination and confirmed by CT scan to have an umbilical hernia. Since discharge from the hospital she has noted some discomfort in the peri-umbilical area. She believes the hernia has enlarged slightly. She presents today for consideration for surgical repair.  Past Medical History  Diagnosis Date  . Diverticulitis     Per pt. States she never had CT scan to confirm this  . Anxiety   . Diverticulosis   . Umbilical hernia     planning surgical repair  . Hypertension     During pregnancy only  . Constipation   . Abdominal distention   . Abdominal pain   . Blood in stool   . Rectal bleeding      Current Outpatient Prescriptions  Medication Sig Dispense Refill  . fluticasone (FLONASE) 50 MCG/ACT nasal spray Place 2 sprays into the nose daily.  16 g  6  . rizatriptan (MAXALT-MLT) 10 MG disintegrating tablet Take 1 tablet (10 mg total) by mouth as needed for migraine. May repeat in 2 hours if needed  10 tablet  1  . sertraline (ZOLOFT) 100 MG tablet Take 100 mg by mouth daily.      . traZODone (DESYREL) 100 MG tablet Take 100 mg by mouth at bedtime.         No Known Allergies   Family History  Problem Relation Age of Onset  . Diabetes Mother   . Ulcerative colitis Father   . Cancer Maternal Grandmother     lung     History   Social History  . Marital Status: Married    Spouse Name: N/A    Number of Children: N/A  . Years of Education: N/A   Social History Main Topics  . Smoking status: Never Smoker   . Smokeless tobacco: Never Used  . Alcohol Use: 0.6 oz/week    1 Cans of beer per week     twice a month  . Drug Use: No  . Sexually  Active: Yes   Other Topics Concern  . None   Social History Narrative  . None     REVIEW OF SYSTEMS - PERTINENT POSITIVES ONLY: Mild discomfort. No prior hernia repair. No signs or symptoms of intestinal obstruction.  EXAM: Filed Vitals:   10/26/11 1202  BP: 116/82  Pulse: 70  Temp: 97.4 F (36.3 C)  Resp: 14    HEENT: normocephalic; pupils equal and reactive; sclerae clear; dentition good; mucous membranes moist NECK:  symmetric on extension; no palpable anterior or posterior cervical lymphadenopathy; no supraclavicular masses; no tenderness CHEST: clear to auscultation bilaterally without rales, rhonchi, or wheezes CARDIAC: regular rate and rhythm without significant murmur; peripheral pulses are full ABDOMEN: soft without distension; bowel sounds present; no mass; no hepatosplenomegaly;  Reducible umbilical hernia had base of umbilicus. On palpation there is likely preperitoneal fat and omentum in the hernia sac. This is at least partially reducible. The fascial defect appears to measure approximately 2 cm. EXT:  non-tender without edema; no deformity NEURO: no gross focal deficits; no sign of tremor   LABORATORY RESULTS: See [facility] (CHL-Epic) for most recent results   RADIOLOGY RESULTS: See [facility] (CHL-Epic) for most recent results  IMPRESSION: Umbilical hernia, symptomatic  PLAN: The patient and I discussed the indications for repair. We discussed the use of prosthetic mesh. We discussed restrictions on her activities following surgery. We discussed the possibility of recurrence with the risk being approximately 5% or less. Patient understands and wishes to proceed. We will make arrangements for outpatient surgery in the near future.  The risks and benefits of the procedure have been discussed at length with the patient.  The patient understands the proposed procedure, potential alternative treatments, and the course of recovery to be expected.   All of the patient's questions have been answered at this time.  The patient wishes to proceed with surgery.  Velora Heckler, MD, FACS General & Endocrine Surgery [facility]   Visit Diagnoses: 1. Umbilical hernia     Primary Care Physician: Tally Due, MD

## 2011-10-26 NOTE — Pre-Procedure Instructions (Signed)
SAME DAY SURGERY INSTRUCTIONS DISCUSSED WITH PT BY PHONE.  PT WAS INSTRUCTED BETASEPT SOAP SHOWER THE NIGHT BEFORE AND THE AM OF SURGERY-BUT NOT TO USE ON FACE, HEAD, PRIVATE AREAS-MAY USE NORMAL SOAP AND SHAMPOO THOSE AREAS.  DO NOT SHAVE SKIN 48 HRS BEFORE USING BETASEPT - TO AVOID SKIN IRRITATION.

## 2011-10-26 NOTE — Patient Instructions (Signed)
[facility]  UMBILICAL OR INGUINAL HERNIA REPAIR POST OP INSTRUCTIONS  Always review your discharge instruction sheet given to you by the facility where your surgery was performed.  1. A  prescription for pain medication may be given to you upon discharge.  Take your pain medication as prescribed, if needed.  If narcotic pain medicine is not needed, then you may take acetaminophen (Tylenol) or ibuprofen (Advil) as needed. 2. Take your usually prescribed medications unless otherwise directed. 3. If you need a refill on your pain medication, please contact your pharmacy.  They will contact our office to request authorization. Prescriptions will not be filled after 5 pm daily or on weekends. 4. You should follow a light diet the first 24 hours after arrival home, such as soup and crackers, etc.  Be sure to include lots of fluids daily.  Resume your normal diet the day after surgery. 5. Most patients will experience some swelling and bruising around the umbilicus or in the groin and scrotum.  Ice packs and reclining will help.  Swelling and bruising can take several days to resolve.  6. It is common to experience some constipation if taking pain medication after surgery.  Increasing fluid intake and taking a stool softener (such as Colace) will usually help or prevent this problem from occurring.  A mild laxative (Milk of Magnesia or Miralax) should be taken according to package directions if there are no bowel movements after 48 hours. 7. Unless discharge instructions indicate otherwise, you may remove your bandages 24-48 hours after surgery, and you may shower at that time.  You may have steri-strips (small skin tapes) in place directly over the incision.  These strips should be left on the skin for 7-10 days.  If your surgeon used skin glue on the incision, you may shower in 24 hours.  The glue will flake off over the next 2-3 weeks.  Any sutures or staples will be removed at the office  during your follow-up visit. 8. ACTIVITIES:  You may resume regular (light) daily activities beginning the next day--such as daily self-care, walking, climbing stairs--gradually increasing activities as tolerated.  You may have sexual intercourse when it is comfortable.  Refrain from any heavy lifting or straining until approved by your doctor.  You may drive when you are no longer taking prescription pain medication, you can comfortably wear a seatbelt, and you can safely maneuver your car and apply brakes. 9. You should see your doctor in the office for a follow-up appointment approximately 2-3 weeks after your surgery.  Make sure that you call for this appointment within a day or two after you arrive home to insure a convenient appointment time.  WHEN TO CALL YOUR DOCTOR: 1. Fever over 101.0 2. Inability to urinate 3. Nausea and/or vomiting 4. Extreme swelling or bruising 5. Continued bleeding from incision. 6. Increased pain, redness, or drainage from the incision The clinic staff is available to answer your questions during regular business hours.  Please don't hesitate to call and ask to speak to one of the nurses for clinical concerns.  If you have a medical emergency, go to the nearest emergency room or call 911.  A surgeon from [facility] is always on call at the hospital.   [address], [address], [place], [place]  [address]  [address] [address], , [place]   [address] [phone] ? [phone] ? FAX [phone] Website: [email or web address]   

## 2011-10-27 LAB — COMPREHENSIVE METABOLIC PANEL
ALT: 14 U/L (ref 0–35)
AST: 18 U/L (ref 0–37)
Albumin: 4.2 g/dL (ref 3.5–5.2)
Alkaline Phosphatase: 56 U/L (ref 39–117)
BUN: 10 mg/dL (ref 6–23)
CO2: 25 mEq/L (ref 19–32)
Calcium: 9.3 mg/dL (ref 8.4–10.5)
Chloride: 103 mEq/L (ref 96–112)
Creat: 0.59 mg/dL (ref 0.50–1.10)
Glucose, Bld: 102 mg/dL — ABNORMAL HIGH (ref 70–99)
Potassium: 4.2 mEq/L (ref 3.5–5.3)
Sodium: 137 mEq/L (ref 135–145)
Total Bilirubin: 0.5 mg/dL (ref 0.3–1.2)
Total Protein: 7.2 g/dL (ref 6.0–8.3)

## 2011-10-27 LAB — MEASLES/MUMPS/RUBELLA IMMUNITY
Mumps IgG: 1.96 {ISR} — ABNORMAL HIGH
Rubella: 12.9 IU/mL — ABNORMAL HIGH
Rubeola IgG: 2.19 {ISR} — ABNORMAL HIGH

## 2011-10-27 LAB — LIPID PANEL
Cholesterol: 182 mg/dL (ref 0–200)
HDL: 47 mg/dL (ref >39)
LDL Cholesterol: 107 mg/dL — ABNORMAL HIGH (ref 0–99)
Total CHOL/HDL Ratio: 3.9 Ratio
Triglycerides: 140 mg/dL (ref <150)
VLDL: 28 mg/dL (ref 0–40)

## 2011-10-27 LAB — HEPATITIS B SURFACE ANTIBODY,QUALITATIVE: Hep B S Ab: NEGATIVE

## 2011-10-27 LAB — TSH: TSH: 2.087 u[IU]/mL (ref 0.350–4.500)

## 2011-10-27 LAB — VITAMIN D 25 HYDROXY (VIT D DEFICIENCY, FRACTURES): Vit D, 25-Hydroxy: 69 ng/mL (ref 30–89)

## 2011-10-28 ENCOUNTER — Ambulatory Visit (INDEPENDENT_AMBULATORY_CARE_PROVIDER_SITE_OTHER): Payer: 59 | Admitting: Physician Assistant

## 2011-10-28 DIAGNOSIS — Z23 Encounter for immunization: Secondary | ICD-10-CM

## 2011-10-28 LAB — TB SKIN TEST
Induration: 0 mm
TB Skin Test: NEGATIVE

## 2011-10-31 NOTE — Progress Notes (Signed)
Patient presents for TB read. Labs showed she is not immune to Hepatitis B and needs to start vaccine series.  OK for Hepatitis B #1 today.

## 2011-11-01 ENCOUNTER — Encounter (HOSPITAL_COMMUNITY): Payer: Self-pay | Admitting: Anesthesiology

## 2011-11-01 ENCOUNTER — Encounter (HOSPITAL_COMMUNITY)
Admission: RE | Disposition: A | Discharge status: Home / Self Care | Payer: Self-pay | Source: Ambulatory Visit | Attending: Surgery

## 2011-11-01 ENCOUNTER — Ambulatory Visit (HOSPITAL_COMMUNITY)
Admission: RE | Admit: 2011-11-01 | Discharge: 2011-11-01 | Disposition: A | Discharge status: Home / Self Care | Payer: 59 | Source: Ambulatory Visit | Attending: Surgery | Admitting: Surgery

## 2011-11-01 ENCOUNTER — Encounter (HOSPITAL_COMMUNITY): Payer: Self-pay | Admitting: *Deleted

## 2011-11-01 ENCOUNTER — Ambulatory Visit (HOSPITAL_COMMUNITY): Payer: 59 | Admitting: Anesthesiology

## 2011-11-01 DIAGNOSIS — K429 Umbilical hernia without obstruction or gangrene: Secondary | ICD-10-CM

## 2011-11-01 DIAGNOSIS — Z79899 Other long term (current) drug therapy: Secondary | ICD-10-CM | POA: Insufficient documentation

## 2011-11-01 DIAGNOSIS — K420 Umbilical hernia with obstruction, without gangrene: Secondary | ICD-10-CM | POA: Insufficient documentation

## 2011-11-01 HISTORY — PX: UMBILICAL HERNIA REPAIR: SHX196

## 2011-11-01 LAB — PAP IG W/ RFLX HPV ASCU

## 2011-11-01 LAB — CBC
HCT: 38.3 % (ref 36.0–46.0)
Hemoglobin: 13.4 g/dL (ref 12.0–15.0)
MCH: 30.3 pg (ref 26.0–34.0)
MCHC: 35 g/dL (ref 30.0–36.0)
MCV: 86.7 fL (ref 78.0–100.0)
Platelets: 307 10*3/uL (ref 150–400)
RBC: 4.42 MIL/uL (ref 3.87–5.11)
RDW: 12.5 % (ref 11.5–15.5)
WBC: 8.3 10*3/uL (ref 4.0–10.5)

## 2011-11-01 LAB — SURGICAL PCR SCREEN
MRSA, PCR: NEGATIVE
Staphylococcus aureus: NEGATIVE

## 2011-11-01 LAB — HCG, SERUM, QUALITATIVE: Preg, Serum: NEGATIVE

## 2011-11-01 SURGERY — REPAIR, HERNIA, UMBILICAL, ADULT
Anesthesia: General | Site: Abdomen | Wound class: Clean

## 2011-11-01 MED ORDER — PROMETHAZINE HCL 25 MG/ML IJ SOLN
INTRAMUSCULAR | Status: AC
  Filled 2011-11-01: qty 1

## 2011-11-01 MED ORDER — DEXAMETHASONE SODIUM PHOSPHATE 10 MG/ML IJ SOLN
INTRAMUSCULAR | Status: DC | PRN
  Administered 2011-11-01: 10 mg via INTRAVENOUS

## 2011-11-01 MED ORDER — HYDROCODONE-ACETAMINOPHEN 5-325 MG PO TABS
ORAL_TABLET | ORAL | Status: AC
  Administered 2011-11-01: 1 via ORAL
  Filled 2011-11-01: qty 1

## 2011-11-01 MED ORDER — LACTATED RINGERS IV SOLN
INTRAVENOUS | Status: DC
  Administered 2011-11-01: 1000 mL via INTRAVENOUS

## 2011-11-01 MED ORDER — CEFAZOLIN SODIUM 1 G IJ SOLR
3.0000 g | INTRAMUSCULAR | Status: AC
  Administered 2011-11-01: 3 g via INTRAVENOUS
  Filled 2011-11-01: qty 30

## 2011-11-01 MED ORDER — HYDROMORPHONE HCL PF 1 MG/ML IJ SOLN
INTRAMUSCULAR | Status: DC | PRN
  Administered 2011-11-01 (×2): 1 mg via INTRAVENOUS

## 2011-11-01 MED ORDER — HYDROCODONE-ACETAMINOPHEN 5-325 MG PO TABS: 1.0000 | ORAL_TABLET | ORAL | Status: AC | PRN

## 2011-11-01 MED ORDER — ACETAMINOPHEN 10 MG/ML IV SOLN
INTRAVENOUS | Status: AC
  Filled 2011-11-01: qty 100

## 2011-11-01 MED ORDER — ACETAMINOPHEN 10 MG/ML IV SOLN
INTRAVENOUS | Status: DC | PRN
  Administered 2011-11-01: 1000 mg via INTRAVENOUS

## 2011-11-01 MED ORDER — FENTANYL CITRATE 0.05 MG/ML IJ SOLN
INTRAMUSCULAR | Status: DC | PRN
  Administered 2011-11-01: 50 ug via INTRAVENOUS
  Administered 2011-11-01 (×2): 100 ug via INTRAVENOUS

## 2011-11-01 MED ORDER — LIDOCAINE HCL (CARDIAC) 20 MG/ML IV SOLN
INTRAVENOUS | Status: DC | PRN
  Administered 2011-11-01: 50 mg via INTRAVENOUS

## 2011-11-01 MED ORDER — CEFAZOLIN SODIUM 1-5 GM-% IV SOLN
INTRAVENOUS | Status: AC
  Filled 2011-11-01: qty 50

## 2011-11-01 MED ORDER — HYDROMORPHONE HCL PF 1 MG/ML IJ SOLN
0.2500 mg | INTRAMUSCULAR | Status: DC | PRN
  Administered 2011-11-01 (×2): 0.5 mg via INTRAVENOUS

## 2011-11-01 MED ORDER — ONDANSETRON HCL 4 MG/2ML IJ SOLN
INTRAMUSCULAR | Status: DC | PRN
  Administered 2011-11-01: 4 mg via INTRAVENOUS

## 2011-11-01 MED ORDER — MUPIROCIN 2 % EX OINT
TOPICAL_OINTMENT | CUTANEOUS | Status: AC
  Filled 2011-11-01: qty 22

## 2011-11-01 MED ORDER — GLYCOPYRROLATE 0.2 MG/ML IJ SOLN
INTRAMUSCULAR | Status: DC | PRN
  Administered 2011-11-01: .8 mg via INTRAVENOUS

## 2011-11-01 MED ORDER — PROPOFOL 10 MG/ML IV BOLUS
INTRAVENOUS | Status: DC | PRN
  Administered 2011-11-01: 200 mg via INTRAVENOUS

## 2011-11-01 MED ORDER — NEOSTIGMINE METHYLSULFATE 1 MG/ML IJ SOLN
INTRAMUSCULAR | Status: DC | PRN
  Administered 2011-11-01: 4.5 mg via INTRAVENOUS

## 2011-11-01 MED ORDER — CEFAZOLIN SODIUM-DEXTROSE 2-3 GM-% IV SOLR
INTRAVENOUS | Status: AC
  Filled 2011-11-01: qty 50

## 2011-11-01 MED ORDER — ROCURONIUM BROMIDE 100 MG/10ML IV SOLN
INTRAVENOUS | Status: DC | PRN
  Administered 2011-11-01: 40 mg via INTRAVENOUS

## 2011-11-01 MED ORDER — BUPIVACAINE HCL 0.25 % IJ SOLN
INTRAMUSCULAR | Status: DC | PRN
  Administered 2011-11-01: 20 mL

## 2011-11-01 MED ORDER — LACTATED RINGERS IV SOLN: INTRAVENOUS | Status: DC

## 2011-11-01 MED ORDER — BUPIVACAINE HCL (PF) 0.25 % IJ SOLN
INTRAMUSCULAR | Status: AC
  Filled 2011-11-01: qty 30

## 2011-11-01 MED ORDER — SUCCINYLCHOLINE CHLORIDE 20 MG/ML IJ SOLN
INTRAMUSCULAR | Status: DC | PRN
  Administered 2011-11-01: 100 mg via INTRAVENOUS

## 2011-11-01 MED ORDER — MIDAZOLAM HCL 5 MG/5ML IJ SOLN
INTRAMUSCULAR | Status: DC | PRN
  Administered 2011-11-01: 2 mg via INTRAVENOUS

## 2011-11-01 MED ORDER — HYDROMORPHONE HCL PF 1 MG/ML IJ SOLN
INTRAMUSCULAR | Status: AC
  Filled 2011-11-01: qty 1

## 2011-11-01 MED ORDER — PROMETHAZINE HCL 25 MG/ML IJ SOLN
6.2500 mg | Freq: Four times a day (QID) | INTRAMUSCULAR | Status: DC | PRN
  Administered 2011-11-01: 6.25 mg via INTRAVENOUS

## 2011-11-01 MED ORDER — HYDROCODONE-ACETAMINOPHEN 5-325 MG PO TABS
1.0000 | ORAL_TABLET | Freq: Once | ORAL | Status: AC
  Administered 2011-11-01: 1 via ORAL

## 2011-11-01 SURGICAL SUPPLY — 36 items
BENZOIN TINCTURE PRP APPL 2/3 (GAUZE/BANDAGES/DRESSINGS) ×2 IMPLANT
BLADE HEX COATED 2.75 (ELECTRODE) ×2 IMPLANT
BLADE SURG SZ10 CARB STEEL (BLADE) ×4 IMPLANT
CHLORAPREP W/TINT 26ML (MISCELLANEOUS) ×2 IMPLANT
CLEANER TIP ELECTROSURG 2X2 (MISCELLANEOUS) ×2 IMPLANT
CLOTH BEACON ORANGE TIMEOUT ST (SAFETY) ×2 IMPLANT
CLSR STERI-STRIP ANTIMIC 1/2X4 (GAUZE/BANDAGES/DRESSINGS) ×2 IMPLANT
DECANTER SPIKE VIAL GLASS SM (MISCELLANEOUS) ×2 IMPLANT
DRAPE LAPAROTOMY T 102X78X121 (DRAPES) ×2 IMPLANT
ELECT REM PT RETURN 9FT ADLT (ELECTROSURGICAL) ×2
ELECTRODE REM PT RTRN 9FT ADLT (ELECTROSURGICAL) ×1 IMPLANT
GLOVE BIOGEL PI IND STRL 7.0 (GLOVE) ×1 IMPLANT
GLOVE BIOGEL PI INDICATOR 7.0 (GLOVE) ×1
GLOVE SURG ORTHO 8.0 STRL STRW (GLOVE) ×2 IMPLANT
GOWN STRL NON-REIN LRG LVL3 (GOWN DISPOSABLE) ×2 IMPLANT
GOWN STRL REIN XL XLG (GOWN DISPOSABLE) ×4 IMPLANT
KIT BASIN OR (CUSTOM PROCEDURE TRAY) ×2 IMPLANT
MESH VENTRALEX ST 1-7/10 CRC S (Mesh General) ×2 IMPLANT
NEEDLE HYPO 25X1 1.5 SAFETY (NEEDLE) ×2 IMPLANT
NS IRRIG 1000ML POUR BTL (IV SOLUTION) ×2 IMPLANT
PACK BASIC VI WITH GOWN DISP (CUSTOM PROCEDURE TRAY) ×2 IMPLANT
PEN SKIN MARKING BROAD (MISCELLANEOUS) ×2 IMPLANT
PENCIL BUTTON HOLSTER BLD 10FT (ELECTRODE) ×2 IMPLANT
SPONGE GAUZE 4X4 12PLY (GAUZE/BANDAGES/DRESSINGS) ×2 IMPLANT
SPONGE LAP 4X18 X RAY DECT (DISPOSABLE) ×2 IMPLANT
STRIP CLOSURE SKIN 1/2X4 (GAUZE/BANDAGES/DRESSINGS) ×2 IMPLANT
SUT ETHIBOND 0 MO6 C/R (SUTURE) ×2 IMPLANT
SUT ETHIBOND NAB CT1 #1 30IN (SUTURE) IMPLANT
SUT MNCRL AB 4-0 PS2 18 (SUTURE) ×2 IMPLANT
SUT VIC AB 3-0 SH 18 (SUTURE) IMPLANT
SUT VIC AB 3-0 SH 27 (SUTURE) ×1
SUT VIC AB 3-0 SH 27X BRD (SUTURE) ×1 IMPLANT
SYR CONTROL 10ML LL (SYRINGE) ×2 IMPLANT
TAPE CLOTH SURG 4X10 WHT LF (GAUZE/BANDAGES/DRESSINGS) ×2 IMPLANT
TOWEL OR 17X26 10 PK STRL BLUE (TOWEL DISPOSABLE) ×2 IMPLANT
WATER STERILE IRR 1500ML POUR (IV SOLUTION) ×2 IMPLANT

## 2011-11-01 NOTE — Interval H&P Note (Signed)
History and Physical Interval Note:  11/01/2011 2:30 PM  Ann Santos  has presented today for surgery, with the diagnosis of umbilical hernia.  The various methods of treatment have been discussed with the patient and family. After consideration of risks, benefits and other options for treatment, the patient has consented to    Procedure(s) (LRB): HERNIA REPAIR UMBILICAL ADULT (N/A) INSERTION OF MESH (N/A) as a surgical intervention .    The patient's history has been reviewed, patient examined, no change in status, stable for surgery.  I have reviewed the patients' chart and labs.  Questions were answered to the patient's satisfaction.    Velora Heckler, MD, [facility] Office: [phone]    Ammanda Dobbins Judie Petit

## 2011-11-01 NOTE — Brief Op Note (Signed)
11/01/2011  3:50 PM  PATIENT:  Ann Santos  31 y.o. female  PRE-OPERATIVE DIAGNOSIS:  umbilical hernia  POST-OPERATIVE DIAGNOSIS:  umbilical hernia, incarcerated  PROCEDURE:  Repair incarcerated umbilical hernia with Ventralex ST Bard mesh  SURGEON:  Surgeon(s) and Role:    * Velora Heckler, MD - Primary  ANESTHESIA:   general  EBL:     BLOOD ADMINISTERED:none  DRAINS: none   LOCAL MEDICATIONS USED:  MARCAINE     SPECIMEN:  No Specimen  DISPOSITION OF SPECIMEN:  N/A  COUNTS:  YES  TOURNIQUET:  * No tourniquets in log *  DICTATION: .Other Dictation: Dictation Number [id number]  PLAN OF CARE: Discharge to home after PACU  PATIENT DISPOSITION:  PACU - hemodynamically stable.   Delay start of Pharmacological VTE agent (>24hrs) due to surgical blood loss or risk of bleeding: yes  Velora Heckler, MD, [facility] Office: [phone]

## 2011-11-01 NOTE — Pre-Procedure Instructions (Signed)
Teach back 

## 2011-11-01 NOTE — H&P (View-Only) (Signed)
General Surgery - [facility]  Chief Complaint  Patient presents with  . Umbilical Hernia    follow up from in-patient evaluation - needs to schedule repair    HISTORY: Patient is a 31-year-old white female initially evaluated with during hospitalization for diverticular disease. Patient was found on physical examination and confirmed by CT scan to have an umbilical hernia. Since discharge from the hospital she has noted some discomfort in the peri-umbilical area. She believes the hernia has enlarged slightly. She presents today for consideration for surgical repair.  Past Medical History  Diagnosis Date  . Diverticulitis     Per pt. States she never had CT scan to confirm this  . Anxiety   . Diverticulosis   . Umbilical hernia     planning surgical repair  . Hypertension     During pregnancy only  . Constipation   . Abdominal distention   . Abdominal pain   . Blood in stool   . Rectal bleeding      Current Outpatient Prescriptions  Medication Sig Dispense Refill  . fluticasone (FLONASE) 50 MCG/ACT nasal spray Place 2 sprays into the nose daily.  16 g  6  . rizatriptan (MAXALT-MLT) 10 MG disintegrating tablet Take 1 tablet (10 mg total) by mouth as needed for migraine. May repeat in 2 hours if needed  10 tablet  1  . sertraline (ZOLOFT) 100 MG tablet Take 100 mg by mouth daily.      . traZODone (DESYREL) 100 MG tablet Take 100 mg by mouth at bedtime.         No Known Allergies   Family History  Problem Relation Age of Onset  . Diabetes Mother   . Ulcerative colitis Father   . Cancer Maternal Grandmother     lung     History   Social History  . Marital Status: Married    Spouse Name: N/A    Number of Children: N/A  . Years of Education: N/A   Social History Main Topics  . Smoking status: Never Smoker   . Smokeless tobacco: Never Used  . Alcohol Use: 0.6 oz/week    1 Cans of beer per week     twice a month  . Drug Use: No  . Sexually  Active: Yes   Other Topics Concern  . None   Social History Narrative  . None     REVIEW OF SYSTEMS - PERTINENT POSITIVES ONLY: Mild discomfort. No prior hernia repair. No signs or symptoms of intestinal obstruction.  EXAM: Filed Vitals:   10/26/11 1202  BP: 116/82  Pulse: 70  Temp: 97.4 F (36.3 C)  Resp: 14    HEENT: normocephalic; pupils equal and reactive; sclerae clear; dentition good; mucous membranes moist NECK:  symmetric on extension; no palpable anterior or posterior cervical lymphadenopathy; no supraclavicular masses; no tenderness CHEST: clear to auscultation bilaterally without rales, rhonchi, or wheezes CARDIAC: regular rate and rhythm without significant murmur; peripheral pulses are full ABDOMEN: soft without distension; bowel sounds present; no mass; no hepatosplenomegaly;  Reducible umbilical hernia had base of umbilicus. On palpation there is likely preperitoneal fat and omentum in the hernia sac. This is at least partially reducible. The fascial defect appears to measure approximately 2 cm. EXT:  non-tender without edema; no deformity NEURO: no gross focal deficits; no sign of tremor   LABORATORY RESULTS: See [facility] (CHL-Epic) for most recent results   RADIOLOGY RESULTS: See [facility] (CHL-Epic) for most recent results     IMPRESSION: Umbilical hernia, symptomatic  PLAN: The patient and I discussed the indications for repair. We discussed the use of prosthetic mesh. We discussed restrictions on her activities following surgery. We discussed the possibility of recurrence with the risk being approximately 5% or less. Patient understands and wishes to proceed. We will make arrangements for outpatient surgery in the near future.  The risks and benefits of the procedure have been discussed at length with the patient.  The patient understands the proposed procedure, potential alternative treatments, and the course of recovery to be expected.   All of the patient's questions have been answered at this time.  The patient wishes to proceed with surgery.  Uva Runkel M. Lil Lepage, MD, FACS General & Endocrine Surgery [facility]   Visit Diagnoses: 1. Umbilical hernia     Primary Care Physician: GUEST, CHRIS WARREN, MD   

## 2011-11-01 NOTE — Anesthesia Postprocedure Evaluation (Signed)
  Anesthesia Post-op Note  Patient: Ann Santos  Procedure(s) Performed: Procedure(s) (LRB): HERNIA REPAIR UMBILICAL ADULT (N/A) INSERTION OF MESH (N/A)  Patient Location: PACU  Anesthesia Type: General  Level of Consciousness: awake and alert   Airway and Oxygen Therapy: Patient Spontanous Breathing  Post-op Pain: mild  Post-op Assessment: Post-op Vital signs reviewed, Patient's Cardiovascular Status Stable, Respiratory Function Stable, Patent Airway and No signs of Nausea or vomiting  Post-op Vital Signs: stable  Complications: No apparent anesthesia complications

## 2011-11-01 NOTE — Anesthesia Preprocedure Evaluation (Addendum)
Anesthesia Evaluation  Patient identified by MRN, date of birth, ID band Patient awake    Reviewed: Allergy & Precautions, H&P , NPO status , Patient's Chart, lab work & pertinent test results  Airway Mallampati: II TM Distance: >3 FB Neck ROM: full    Dental No notable dental hx. (+) Dental Advisory Given and Teeth Intact   Pulmonary neg pulmonary ROS,  breath sounds clear to auscultation  Pulmonary exam normal       Cardiovascular Exercise Tolerance: Good hypertension, negative cardio ROS  Rhythm:regular Rate:Normal     Neuro/Psych negative neurological ROS  negative psych ROS   GI/Hepatic negative GI ROS, Neg liver ROS,   Endo/Other  negative endocrine ROSMorbid obesity  Renal/GU negative Renal ROS  negative genitourinary   Musculoskeletal   Abdominal   Peds  Hematology negative hematology ROS (+)   Anesthesia Other Findings   Reproductive/Obstetrics negative OB ROS                          Anesthesia Physical Anesthesia Plan  ASA: II  Anesthesia Plan: General   Post-op Pain Management:    Induction: Intravenous  Airway Management Planned: Oral ETT  Additional Equipment:   Intra-op Plan:   Post-operative Plan: Extubation in OR  Informed Consent: I have reviewed the patients History and Physical, chart, labs and discussed the procedure including the risks, benefits and alternatives for the proposed anesthesia with the patient or authorized representative who has indicated his/her understanding and acceptance.   Dental Advisory Given  Plan Discussed with: CRNA and Surgeon  Anesthesia Plan Comments:        Anesthesia Quick Evaluation

## 2011-11-01 NOTE — Transfer of Care (Signed)
Immediate Anesthesia Transfer of Care Note  Patient: Ann Santos  Procedure(s) Performed: Procedure(s) (LRB): HERNIA REPAIR UMBILICAL ADULT (N/A) INSERTION OF MESH (N/A)  Patient Location: PACU  Anesthesia Type: General  Level of Consciousness: sedated, patient cooperative and responds to stimulaton  Airway & Oxygen Therapy: Patient Spontanous Breathing and Patient connected to face mask oxgen  Post-op Assessment: Report given to PACU RN and Post -op Vital signs reviewed and stable  Post vital signs: Reviewed and stable  Complications: No apparent anesthesia complications

## 2011-11-02 ENCOUNTER — Encounter (HOSPITAL_COMMUNITY): Payer: Self-pay | Admitting: Surgery

## 2011-11-02 LAB — HUMAN PAPILLOMAVIRUS, HIGH RISK: HPV DNA High Risk: NOT DETECTED

## 2011-11-02 NOTE — Op Note (Signed)
NAMEEVELYN, AGUINALDO NO.:  [id number]  MEDICAL RECORD NO.:  [id number]  LOCATION:  [facility]                         FACILITY:  [facility]  PHYSICIAN:  Velora Heckler, MD      DATE OF BIRTH:  05/19/1980  DATE OF PROCEDURE:  11/01/2011                               OPERATIVE REPORT   PREOPERATIVE DIAGNOSIS:  Umbilical hernia.  POSTOPERATIVE DIAGNOSIS:  Umbilical hernia, incarcerated  PROCEDURE:  Repair of incarcerated umbilical hernia with Ventralex ST mesh patch.  SURGEON:  Velora Heckler, MD, FACS  ANESTHESIA:  General per Dr. Ronelle Nigh.  ESTIMATED BLOOD LOSS:  Minimal.  PREPARATION:  ChloraPrep.  COMPLICATIONS:  None.  INDICATIONS:  The patient is a 31 year old white female, who presents with symptomatic umbilical hernia.  This has been present and gradually increasing in size.  On physical examination, she was noted to have a fascial defect measuring approximately 1.5 cm on palpation.  The patient now comes to surgery for repair.  BODY OF REPORT:  Procedure was done in OR #11 at the [facility]  The patient was brought to the operating room, placed in supine position on the operating room table.  Following administration of general endotracheal anesthesia, the patient was prepped and draped in the usual strict aseptic fashion.  After ascertaining that an adequate level of anesthesia had been achieved, an abdominal incision was made transversely just below the level of the umbilicus.  Dissection was carried through subcutaneous tissues and the electrocautery was used for hemostasis.  Fascial plane was identified. The hernia ring is dissected out.  Hernia sac is opened.  The hernia contains a moderate amount of omentum.  This was incarcerated as it is adherent to the superior aspect of the sac at the base of the umbilical skin.  The omentum was excised and the remainder of the omentum reduced back within the peritoneal cavity.  Good  hemostasis was noted.  Fascial defect was defined circumferentially.  It measures approximately 1.5 cm in diameter.  A Ventralex ST patch from Bard was selected.  It was prepared and inserted into the preperitoneal space and deployed circumferentially.  It was secured to the fascial edges circumferentially with interrupted 0 Ethibond simple sutures.  Fascia was then closed over the mesh with interrupted Ethibond simple sutures. Local field block was placed with Marcaine.  Umbilicus was then reaffixed to the abdominal wall with interrupted 0 Ethibond suture. Subcutaneous tissues were closed with interrupted 3-0 Vicryl sutures. Skin was anesthetized with local anesthetic.  Skin edges were reapproximated with a running 4-0 Monocryl subcuticular suture.  Wound was washed and dried and benzoin and Steri-Strips were applied.  Sterile dressings were applied.  The patient was awakened from anesthesia and brought to the recovery room.  The patient tolerated the procedure well.  Velora Heckler, MD, [facility] Office: [phone]    TMG/MEDQ  D:  11/01/2011  T:  11/02/2011  Job:  [id number]  cc:   [facility]

## 2011-11-09 ENCOUNTER — Encounter: Payer: Self-pay | Admitting: *Deleted

## 2011-11-28 ENCOUNTER — Ambulatory Visit (INDEPENDENT_AMBULATORY_CARE_PROVIDER_SITE_OTHER): Payer: 59 | Admitting: Surgery

## 2011-11-28 ENCOUNTER — Encounter (INDEPENDENT_AMBULATORY_CARE_PROVIDER_SITE_OTHER): Payer: Self-pay | Admitting: Surgery

## 2011-11-28 ENCOUNTER — Ambulatory Visit (INDEPENDENT_AMBULATORY_CARE_PROVIDER_SITE_OTHER): Payer: 59 | Admitting: *Deleted

## 2011-11-28 VITALS — BP 126/72 | HR 72 | Temp 97.8°F | Resp 14 | Ht 62.0 in | Wt 284.2 lb

## 2011-11-28 DIAGNOSIS — K429 Umbilical hernia without obstruction or gangrene: Secondary | ICD-10-CM

## 2011-11-28 DIAGNOSIS — Z23 Encounter for immunization: Secondary | ICD-10-CM

## 2011-11-28 NOTE — Progress Notes (Signed)
General Surgery [facility]  Visit Diagnoses: 1. Umbilical hernia     HISTORY: Patient returns for her first postoperative visit having undergone umbilical hernia repair with mesh. Wound initially did well. Over the past several days the patient has noted discomfort, swelling, and some drainage.  EXAM: Incision is healing. However there is mild erythema. On palpation there is fluctuance. Using a Q-tip the incision is opened at its midportion and a moderate amount of serous fluid is evacuated. A quarter-inch gauze wick is placed through the incision into the subcutaneous tissues and dry gauze dressings are placed.  IMPRESSION: Status post umbilical hernia repair with mesh, postoperative seroma formation  PLAN: Patient will remove the wick in 48 hours. She will change the gauze dressings as needed. Patient will return in 10 days for wound check.  Velora Heckler, MD, FACS General & Endocrine Surgery [facility]

## 2011-11-28 NOTE — Patient Instructions (Signed)
Change gauze pad to wound as needed for drainage.  Remove gauze wick on Wednesday evening.  Keep wound covered until no further drainage.  Velora Heckler, MD, [facility] Office: [phone]

## 2011-12-08 ENCOUNTER — Encounter (INDEPENDENT_AMBULATORY_CARE_PROVIDER_SITE_OTHER): Payer: Self-pay | Admitting: Surgery

## 2011-12-08 ENCOUNTER — Ambulatory Visit (INDEPENDENT_AMBULATORY_CARE_PROVIDER_SITE_OTHER): Payer: 59 | Admitting: Surgery

## 2011-12-08 VITALS — BP 130/80 | HR 72 | Temp 97.4°F | Resp 18 | Ht 62.0 in | Wt 288.0 lb

## 2011-12-08 DIAGNOSIS — T148XXD Other injury of unspecified body region, subsequent encounter: Secondary | ICD-10-CM | POA: Insufficient documentation

## 2011-12-08 DIAGNOSIS — K429 Umbilical hernia without obstruction or gangrene: Secondary | ICD-10-CM

## 2011-12-08 DIAGNOSIS — T07XXXA Unspecified multiple injuries, initial encounter: Secondary | ICD-10-CM

## 2011-12-08 NOTE — Patient Instructions (Signed)
Continue to cover wound with dry gauze.  Apply antibiotic ointment to incision 2-3 times daily.  Velora Heckler, MD, [facility] Office: [phone]

## 2011-12-08 NOTE — Progress Notes (Signed)
General Surgery [facility]  Visit Diagnoses: 1. Umbilical hernia   2. Wound healing, delayed     HISTORY: The patient returns for wound check. She developed a seroma following umbilical hernia repair with mesh. Packing has been removed. Patient continues to see a small amount of serous drainage. She denies any significant pain.  EXAM: Surgical wound has improved. There is a small less than 1 cm opening in the central portion of the wound which is probed to a depth of 2 cm with a Q-tip and hydrogen peroxide. There is not appear to be any significant cavity. There is no sign of infection. Antibiotic ointment and dry gauze dressing are placed.  IMPRESSION: Status post umbilical hernia repair with mesh, postoperative seroma  PLAN: Patient will continue to cleanse the wound daily. She will covered with dry gauze. She will apply topical antibiotic ointment 2-3 times daily. She will return to see me in 2 weeks for wound check.  Velora Heckler, MD, FACS General & Endocrine Surgery [facility]

## 2011-12-16 ENCOUNTER — Other Ambulatory Visit: Payer: Self-pay | Admitting: Physician Assistant

## 2011-12-21 ENCOUNTER — Encounter (INDEPENDENT_AMBULATORY_CARE_PROVIDER_SITE_OTHER): Payer: 59 | Admitting: Surgery

## 2011-12-29 ENCOUNTER — Encounter (INDEPENDENT_AMBULATORY_CARE_PROVIDER_SITE_OTHER): Payer: Self-pay | Admitting: Surgery

## 2012-01-10 ENCOUNTER — Ambulatory Visit (INDEPENDENT_AMBULATORY_CARE_PROVIDER_SITE_OTHER): Payer: BC Managed Care – PPO | Admitting: Family Medicine

## 2012-01-10 ENCOUNTER — Ambulatory Visit: Payer: BC Managed Care – PPO

## 2012-01-10 VITALS — BP 143/84 | HR 81 | Temp 98.6°F | Resp 18 | Ht 63.5 in | Wt 288.0 lb

## 2012-01-10 DIAGNOSIS — F419 Anxiety disorder, unspecified: Secondary | ICD-10-CM

## 2012-01-10 DIAGNOSIS — R5383 Other fatigue: Secondary | ICD-10-CM

## 2012-01-10 DIAGNOSIS — J069 Acute upper respiratory infection, unspecified: Secondary | ICD-10-CM

## 2012-01-10 DIAGNOSIS — J029 Acute pharyngitis, unspecified: Secondary | ICD-10-CM

## 2012-01-10 LAB — POCT CBC
Granulocyte percent: 77.7 %G (ref 37–80)
HCT, POC: 45 % (ref 37.7–47.9)
Hemoglobin: 14.4 g/dL (ref 12.2–16.2)
Lymph, poc: 3.3 (ref 0.6–3.4)
MCH, POC: 29.4 pg (ref 27–31.2)
MCHC: 32 g/dL (ref 31.8–35.4)
MCV: 91.8 fL (ref 80–97)
MID (cbc): 0.8 (ref 0–0.9)
MPV: 7.7 fL (ref 0–99.8)
POC Granulocyte: 14.4 — AB (ref 2–6.9)
POC LYMPH PERCENT: 18 %L (ref 10–50)
POC MID %: 4.3 %M (ref 0–12)
Platelet Count, POC: 436 10*3/uL — AB (ref 142–424)
RBC: 4.9 M/uL (ref 4.04–5.48)
RDW, POC: 13.8 %
WBC: 18.5 10*3/uL — AB (ref 4.6–10.2)

## 2012-01-10 LAB — POCT RAPID STREP A (OFFICE): Rapid Strep A Screen: NEGATIVE

## 2012-01-10 MED ORDER — AZITHROMYCIN 250 MG PO TABS: ORAL_TABLET | ORAL | Status: DC

## 2012-01-10 MED ORDER — TRAZODONE HCL 100 MG PO TABS: 100.0000 mg | ORAL_TABLET | Freq: Every day | ORAL | Status: DC

## 2012-01-10 NOTE — Patient Instructions (Addendum)
Thank you for coming in today. I am not sure what is making you sick.  Please come back in a few days if you are not better.  Take the azithromycin as directed Call or go to the emergency room if you get worse, have trouble breathing, have chest pains, or palpitations.  Please followup with Dr. Perrin Maltese for trazodone refill in the future.

## 2012-01-10 NOTE — Progress Notes (Addendum)
Ann Santos is a 31 y.o. female who presents to [facility] today for Patient with 2 week history of not feeling well, has fatigue and nasal congestion. Had cold symptoms, reports felt well, then symptoms returned. Has fatigue and nasal congestion as main complaints. Teaches kindergarten special education. She does have some soreness with the lymph nodes front of neck.     PMH: Reviewed Obesity. Otherwise well.  PSH: Recent hernia surg History  Substance Use Topics  . Smoking status: Never Smoker   . Smokeless tobacco: Never Used  . Alcohol Use: 0.6 oz/week    1 Cans of beer per week     twice a month   ROS as above  Medications reviewed. Current Outpatient Prescriptions  Medication Sig Dispense Refill  . FIBER PO Take 1 tablet by mouth daily.      . fluticasone (FLONASE) 50 MCG/ACT nasal spray Place 2 sprays into the nose daily.  16 g  6  . ibuprofen (ADVIL,MOTRIN) 200 MG tablet Take 600 mg by mouth every 6 (six) hours as needed. pain      . rizatriptan (MAXALT-MLT) 10 MG disintegrating tablet Take 1 tablet (10 mg total) by mouth as needed for migraine. May repeat in 2 hours if needed  10 tablet  1  . sertraline (ZOLOFT) 100 MG tablet Take 100 mg by mouth every evening.       . traZODone (DESYREL) 100 MG tablet Take 1 tablet (100 mg total) by mouth at bedtime.  30 tablet  0  . DISCONTD: traZODone (DESYREL) 100 MG tablet Take 100 mg by mouth at bedtime.      Marland Kitchen azithromycin (ZITHROMAX) 250 MG tablet 2 TABS PO day 1. 1 pill po days 2-5  6 tablet  0    Exam:  BP 143/84  Pulse 81  Temp 98.6 F (37 C) (Oral)  Resp 18  Ht 5' 3.5" (1.613 m)  Wt 288 lb (130.636 kg)  BMI 50.22 kg/m2  LMP 01/01/2012 Gen: Well NAD HEENT: EOMI,  MMM, posterior pharynx normal-appearing no exudate or erythema. Bilateral tympanic membranes are retracted without erythema.  Mild bilateral anterior cervical lymphadenopathy Lungs: CTABL Nl WOB Heart: RRR no MRG Abd: NABS, NT, ND Exts: Non edematous BL  LE, warm  and well perfused.   Results for orders placed in visit on 01/10/12 (from the past 72 hour(s))  POCT CBC     Status: Abnormal   Collection Time   01/10/12  5:48 PM      Component Value Range Comment   WBC 18.5 (*) 4.6 - 10.2 K/uL    Lymph, poc 3.3  0.6 - 3.4    POC LYMPH PERCENT 18.0  10 - 50 %L    MID (cbc) 0.8  0 - 0.9    POC MID % 4.3  0 - 12 %M    POC Granulocyte 14.4 (*) 2 - 6.9    Granulocyte percent 77.7  37 - 80 %G    RBC 4.90  4.04 - 5.48 M/uL    Hemoglobin 14.4  12.2 - 16.2 g/dL    HCT, POC 82.9  56.2 - 47.9 %    MCV 91.8  80 - 97 fL    MCH, POC 29.4  27 - 31.2 pg    MCHC 32.0  31.8 - 35.4 g/dL    RDW, POC 13.0      Platelet Count, POC 436 (*) 142 - 424 K/uL    MPV 7.7  0 - 99.8 fL  POCT RAPID STREP A (OFFICE)     Status: Normal   Collection Time   01/10/12  6:24 PM      Component Value Range Comment   Rapid Strep A Screen Negative  Negative    Chest x-ray 2 view: No infiltrate noted.  Mild bronchitic changes   Assessment and Plan: 31 y.o. female with likely viral URI. However her white count is strangely elevated. She looks well on exam and her vital signs are largely within normal limits.  Her lungs are clear her abdomen is soft and nontender and she denies any urinary symptoms. Her chest x-ray is normal appearing as is the rest of her labs. At this point I'm not sure what is causing her symptoms nor elevated white count. I suspect Epstein-Barr virus or other viral etiology is most likely.  However high white count with high granulocyte index is also commented bacterial infections. I feel at this point empiric treatment with azithromycin is warranted.    We'll send off a confirmatory CBC with differential and Epstein-Barr viral panel.   Discussed specific warning signs or symptoms.  Patient expresses understanding and will followup if not improved in a few days or worsening   At the end of visit the patient noted that she has run out of her chronic scheduled  trazodone. She takes this for anxiety/insomnia.  She has her refills today. Plan to refill for one month. She should followup with Dr. Perrin Maltese her primary care Dr. for further refills. She expresses understanding

## 2012-01-10 NOTE — Progress Notes (Signed)
  Subjective:    Patient ID: Ann Santos, female    DOB: Apr 14, 1981, 31 y.o.   MRN: [id number]  HPI  Patient with 2 week history of not feeling well, has fatigue and nasal congestion. Had cold symptoms, reports felt well, then symptoms returned. Has fatigue and nasal congestion as main complaints. Teaches kindergarten special education. She does have some soreness with the lymph nodes front of neck.   Review of Systems    Obese white female,not in obvious distress. Denies chronic health problems, remote history of migranes, otherwise healthy. Objective:   Physical Exam        Assessment & Plan:

## 2012-01-10 NOTE — Progress Notes (Signed)
Discussed with Dr. Denyse Amass approach and management and reviewed labs and agree.

## 2012-01-11 LAB — CBC WITH DIFFERENTIAL/PLATELET
Basophils Absolute: 0 10*3/uL (ref 0.0–0.1)
Basophils Relative: 0 % (ref 0–1)
Eosinophils Absolute: 0.1 10*3/uL (ref 0.0–0.7)
Eosinophils Relative: 1 % (ref 0–5)
HCT: 40.7 % (ref 36.0–46.0)
Hemoglobin: 14.1 g/dL (ref 12.0–15.0)
Lymphocytes Relative: 18 % (ref 12–46)
Lymphs Abs: 2.9 10*3/uL (ref 0.7–4.0)
MCH: 29.8 pg (ref 26.0–34.0)
MCHC: 34.6 g/dL (ref 30.0–36.0)
MCV: 86 fL (ref 78.0–100.0)
Monocytes Absolute: 1 10*3/uL (ref 0.1–1.0)
Monocytes Relative: 6 % (ref 3–12)
Neutro Abs: 12.3 10*3/uL — ABNORMAL HIGH (ref 1.7–7.7)
Neutrophils Relative %: 75 % (ref 43–77)
Platelets: 409 10*3/uL — ABNORMAL HIGH (ref 150–400)
RBC: 4.73 MIL/uL (ref 3.87–5.11)
RDW: 13 % (ref 11.5–15.5)
WBC: 16.3 10*3/uL — ABNORMAL HIGH (ref 4.0–10.5)

## 2012-01-12 LAB — EPSTEIN-BARR VIRUS VCA ANTIBODY PANEL
EBV EA IgG: <5 U/mL (ref <9.0)
EBV NA IgG: 41.7 U/mL — ABNORMAL HIGH (ref <18.0)
EBV VCA IgG: >750 U/mL — ABNORMAL HIGH (ref <18.0)
EBV VCA IgM: <10 U/mL (ref <36.0)

## 2012-01-30 ENCOUNTER — Ambulatory Visit (INDEPENDENT_AMBULATORY_CARE_PROVIDER_SITE_OTHER): Payer: BC Managed Care – PPO | Admitting: Physician Assistant

## 2012-01-30 ENCOUNTER — Telehealth: Payer: Self-pay

## 2012-01-30 VITALS — BP 124/78 | HR 74 | Temp 98.7°F | Resp 18 | Ht 62.5 in | Wt 288.0 lb

## 2012-01-30 DIAGNOSIS — F419 Anxiety disorder, unspecified: Secondary | ICD-10-CM

## 2012-01-30 DIAGNOSIS — J329 Chronic sinusitis, unspecified: Secondary | ICD-10-CM

## 2012-01-30 DIAGNOSIS — F3289 Other specified depressive episodes: Secondary | ICD-10-CM

## 2012-01-30 DIAGNOSIS — F329 Major depressive disorder, single episode, unspecified: Secondary | ICD-10-CM

## 2012-01-30 DIAGNOSIS — R5381 Other malaise: Secondary | ICD-10-CM

## 2012-01-30 DIAGNOSIS — J309 Allergic rhinitis, unspecified: Secondary | ICD-10-CM

## 2012-01-30 DIAGNOSIS — F411 Generalized anxiety disorder: Secondary | ICD-10-CM

## 2012-01-30 DIAGNOSIS — F32A Depression, unspecified: Secondary | ICD-10-CM

## 2012-01-30 DIAGNOSIS — R195 Other fecal abnormalities: Secondary | ICD-10-CM

## 2012-01-30 DIAGNOSIS — R5383 Other fatigue: Secondary | ICD-10-CM

## 2012-01-30 LAB — CBC WITH DIFFERENTIAL/PLATELET
Basophils Absolute: 0 10*3/uL (ref 0.0–0.1)
Basophils Relative: 0 % (ref 0–1)
Eosinophils Absolute: 0.1 10*3/uL (ref 0.0–0.7)
Eosinophils Relative: 1 % (ref 0–5)
HCT: 42.2 % (ref 36.0–46.0)
Hemoglobin: 14.5 g/dL (ref 12.0–15.0)
Lymphocytes Relative: 12 % (ref 12–46)
Lymphs Abs: 1.1 10*3/uL (ref 0.7–4.0)
MCH: 29.3 pg (ref 26.0–34.0)
MCHC: 34.4 g/dL (ref 30.0–36.0)
MCV: 85.3 fL (ref 78.0–100.0)
Monocytes Absolute: 0.4 10*3/uL (ref 0.1–1.0)
Monocytes Relative: 5 % (ref 3–12)
Neutro Abs: 8 10*3/uL — ABNORMAL HIGH (ref 1.7–7.7)
Neutrophils Relative %: 82 % — ABNORMAL HIGH (ref 43–77)
Platelets: 349 10*3/uL (ref 150–400)
RBC: 4.95 MIL/uL (ref 3.87–5.11)
RDW: 13 % (ref 11.5–15.5)
WBC: 9.6 10*3/uL (ref 4.0–10.5)

## 2012-01-30 LAB — POCT CBC
Granulocyte percent: 83.5 %G — AB (ref 37–80)
HCT, POC: 45.5 % (ref 37.7–47.9)
Hemoglobin: 14.6 g/dL (ref 12.2–16.2)
Lymph, poc: 1.3 (ref 0.6–3.4)
MCH, POC: 29.2 pg (ref 27–31.2)
MCHC: 32.1 g/dL (ref 31.8–35.4)
MCV: 91 fL (ref 80–97)
MID (cbc): 0.4 (ref 0–0.9)
MPV: 7.5 fL (ref 0–99.8)
POC Granulocyte: 8.5 — AB (ref 2–6.9)
POC LYMPH PERCENT: 12.9 %L (ref 10–50)
POC MID %: 3.6 %M (ref 0–12)
Platelet Count, POC: 387 10*3/uL (ref 142–424)
RBC: 5 M/uL (ref 4.04–5.48)
RDW, POC: 13.2 %
WBC: 10.2 10*3/uL (ref 4.6–10.2)

## 2012-01-30 LAB — TSH: TSH: 2.286 u[IU]/mL (ref 0.350–4.500)

## 2012-01-30 LAB — COMPREHENSIVE METABOLIC PANEL
ALT: 17 U/L (ref 0–35)
AST: 17 U/L (ref 0–37)
Albumin: 4.1 g/dL (ref 3.5–5.2)
Alkaline Phosphatase: 56 U/L (ref 39–117)
BUN: 13 mg/dL (ref 6–23)
CO2: 26 mEq/L (ref 19–32)
Calcium: 9.2 mg/dL (ref 8.4–10.5)
Chloride: 104 mEq/L (ref 96–112)
Creat: 0.56 mg/dL (ref 0.50–1.10)
Glucose, Bld: 85 mg/dL (ref 70–99)
Potassium: 4.1 mEq/L (ref 3.5–5.3)
Sodium: 138 mEq/L (ref 135–145)
Total Bilirubin: 0.6 mg/dL (ref 0.3–1.2)
Total Protein: 7.1 g/dL (ref 6.0–8.3)

## 2012-01-30 LAB — GLUCOSE, POCT (MANUAL RESULT ENTRY): POC Glucose: 74 mg/dl (ref 70–99)

## 2012-01-30 LAB — IFOBT (OCCULT BLOOD): IFOBT: POSITIVE

## 2012-01-30 MED ORDER — FLUTICASONE PROPIONATE 50 MCG/ACT NA SUSP: 2.0000 | Freq: Every day | NASAL | Status: DC

## 2012-01-30 MED ORDER — SERTRALINE HCL 100 MG PO TABS: 100.0000 mg | ORAL_TABLET | Freq: Every evening | ORAL | Status: DC

## 2012-01-30 MED ORDER — AMOXICILLIN-POT CLAVULANATE 875-125 MG PO TABS: 1.0000 | ORAL_TABLET | Freq: Two times a day (BID) | ORAL | Status: DC

## 2012-01-30 MED ORDER — TRAZODONE HCL 100 MG PO TABS: 100.0000 mg | ORAL_TABLET | Freq: Every day | ORAL | Status: DC

## 2012-01-30 NOTE — Progress Notes (Signed)
Discussed, reviewed and agree.  

## 2012-01-30 NOTE — Telephone Encounter (Signed)
Patients husband states that patient has thrown up 4 times since counseling meeting today and is very, very sick. Please call in something to control nausea.

## 2012-01-30 NOTE — Progress Notes (Signed)
Subjective:    Patient ID: [name], female    DOB: 1981-04-13, 31 y.o.   MRN: [id number]  HPI  Ms. Licausi is a 31 yr old female who was seen here 3 weeks ago.  At that time she was diagnosed with a likely viral URI but was also prescribed azithro due to elevated WBC.  She completed her zpak and felt somewhat better, but over the last three weeks she has had no energy.  She states she has never felt this bad; it takes all of her energy to accomplish daily tasks.  She is napping lots during the day.  She has headaches and aches and pains and feels like she "has the flu".  She states she is getting plenty of sleep at night.  She takes trazadone QHS.  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  Her husband recently had strep throat, and her 2 yr old daughter is currently ill with GI symptoms.  She denies fever but experiences chills.  She denies any bites or exposures.  She has not had a rash.  She does state that she had several episode of dark black stools last week.  Wondered if she could be pregnant, but LMP 01/28/12    Review of Systems  Constitutional: Positive for fatigue. Negative for fever, chills, appetite change and unexpected weight change.  Eyes: Negative.   Respiratory: Negative.   Cardiovascular: Negative.  Chest pain: briefly yesterday.  Gastrointestinal:       Dark stools last week  Genitourinary: Negative.   Musculoskeletal: Positive for back pain and arthralgias.  Neurological: Positive for headaches. Negative for dizziness, syncope and light-headedness.       Objective:   Physical Exam  Constitutional: She is oriented to person, place, and time. Vital signs are normal. She appears well-developed and well-nourished. No distress.  HENT:  Right Ear: Hearing, tympanic membrane, external ear and ear canal normal.  Left Ear: Hearing, tympanic membrane, external ear and ear canal normal.  Nose: Nose normal. Right sinus exhibits no maxillary sinus tenderness and no frontal  sinus tenderness. Left sinus exhibits no maxillary sinus tenderness and no frontal sinus tenderness.  Eyes: Conjunctivae normal and EOM are normal. Pupils are equal, round, and reactive to light.  Neck: No thyromegaly present.  Cardiovascular: Normal rate, regular rhythm, normal heart sounds and intact distal pulses.  Exam reveals no gallop and no friction rub.   No murmur heard. Pulmonary/Chest: Breath sounds normal. She has no wheezes. She has no rales.  Abdominal: Soft. Bowel sounds are normal. There is no tenderness. There is no rebound and no guarding.  Musculoskeletal: She exhibits no edema.  Lymphadenopathy:    She has no cervical adenopathy.  Neurological: She is alert and oriented to person, place, and time.  Skin: Skin is warm and dry.     Results for orders placed in visit on 01/30/12  IFOBT (OCCULT BLOOD)      Component Value Range   IFOBT Positive    POCT CBC      Component Value Range   WBC 10.2  4.6 - 10.2 K/uL   Lymph, poc 1.3  0.6 - 3.4   POC LYMPH PERCENT 12.9  10 - 50 %L   MID (cbc) 0.4  0 - 0.9   POC MID % 3.6  0 - 12 %M   POC Granulocyte 8.5 (*) 2 - 6.9   Granulocyte percent 83.5 (*) 37 - 80 %G   RBC 5.00  4.04 - 5.48 M/uL   Hemoglobin 14.6  12.2 - 16.2 g/dL   HCT, POC 16.1  09.6 - 47.9 %   MCV 91.0  80 - 97 fL   MCH, POC 29.2  27 - 31.2 pg   MCHC 32.1  31.8 - 35.4 g/dL   RDW, POC 04.5     Platelet Count, POC 387  142 - 424 K/uL   MPV 7.5  0 - 99.8 fL  GLUCOSE, POCT (MANUAL RESULT ENTRY)      Component Value Range   POC Glucose 74  70 - 99 mg/dl        Assessment & Plan:  Ms. Hebdon is a 32 yr old female who was seen here 3 weeks ago and diagnosed with viral URI.  She completed a zpak due to an elevated WBC but has had persistent fatigue since that time.  She looks well and physical exam is normal.  White count today is 10.2 down from 16 at last visit.  CMet, CBC w/ diff, and TSH are pending.  One component of her fatigue may be undertreated allergic  rhinitis.  She has previously been on flonase but now only uses it prn.  We discussed reinitiating regular flonase use, and I refilled this for her.  In addition, her previous course of azithromycin may have undertreated a sinusitis.  I will go ahead and treat her with a course of augmentin which has better sinus penetration.  Due to her obesity, OSA may also contribute to day time fatigue.  If her fatigue persists, a sleep study may be warranted.  FOBT done today was positive for occult blood.  Ms. Yeoman does have a known history of diverticular disease and was actually hospitalized for that this summer.  At the time she was seen by Dr. Elnoria Howard.  I will refer her back to Dr. Elnoria Howard for evaluation and management.  In addition, per her request, I have refilled her zoloft and trazodone for 60 days.  She has recently relocated from [place] and would like to establish care here with Dr. Perrin Maltese.  She will make an appointment with him in the next two months.

## 2012-01-31 MED ORDER — ONDANSETRON 8 MG PO TBDP: 8.0000 mg | ORAL_TABLET | Freq: Three times a day (TID) | ORAL | Status: DC | PRN

## 2012-01-31 NOTE — Telephone Encounter (Signed)
Elizabeth evaluated patient in office yesterday, now she has new onset of Vomiting, husband requesting meds for this, GI eval pending. Please advise.

## 2012-01-31 NOTE — Telephone Encounter (Signed)
Called to advise this was sent in for her. I spoke to him.

## 2012-01-31 NOTE — Telephone Encounter (Signed)
Zofran sent to pharmacy to help with nausea

## 2012-02-02 ENCOUNTER — Ambulatory Visit: Payer: BC Managed Care – PPO | Admitting: Physician Assistant

## 2012-02-23 ENCOUNTER — Other Ambulatory Visit: Payer: Self-pay | Admitting: Physician Assistant

## 2012-03-31 ENCOUNTER — Other Ambulatory Visit: Payer: Self-pay

## 2012-03-31 DIAGNOSIS — F32A Depression, unspecified: Secondary | ICD-10-CM

## 2012-03-31 DIAGNOSIS — F419 Anxiety disorder, unspecified: Secondary | ICD-10-CM

## 2012-03-31 DIAGNOSIS — F329 Major depressive disorder, single episode, unspecified: Secondary | ICD-10-CM

## 2012-03-31 MED ORDER — TRAZODONE HCL 100 MG PO TABS: 100.0000 mg | ORAL_TABLET | Freq: Every day | ORAL | Status: DC

## 2012-04-12 ENCOUNTER — Other Ambulatory Visit: Payer: Self-pay | Admitting: Physician Assistant

## 2012-04-29 ENCOUNTER — Ambulatory Visit (INDEPENDENT_AMBULATORY_CARE_PROVIDER_SITE_OTHER): Payer: BC Managed Care – PPO | Admitting: Physician Assistant

## 2012-04-29 VITALS — BP 130/83 | HR 94 | Temp 97.8°F | Resp 16 | Ht 61.0 in | Wt 293.0 lb

## 2012-04-29 DIAGNOSIS — J309 Allergic rhinitis, unspecified: Secondary | ICD-10-CM

## 2012-04-29 DIAGNOSIS — F32A Depression, unspecified: Secondary | ICD-10-CM

## 2012-04-29 DIAGNOSIS — F329 Major depressive disorder, single episode, unspecified: Secondary | ICD-10-CM

## 2012-04-29 DIAGNOSIS — F419 Anxiety disorder, unspecified: Secondary | ICD-10-CM

## 2012-04-29 DIAGNOSIS — Z23 Encounter for immunization: Secondary | ICD-10-CM

## 2012-04-29 DIAGNOSIS — F411 Generalized anxiety disorder: Secondary | ICD-10-CM

## 2012-04-29 DIAGNOSIS — J329 Chronic sinusitis, unspecified: Secondary | ICD-10-CM

## 2012-04-29 MED ORDER — TRAMADOL HCL 50 MG PO TABS: 50.0000 mg | ORAL_TABLET | Freq: Three times a day (TID) | ORAL | Status: DC | PRN

## 2012-04-29 MED ORDER — SERTRALINE HCL 100 MG PO TABS: 100.0000 mg | ORAL_TABLET | Freq: Every evening | ORAL | Status: DC

## 2012-04-29 MED ORDER — FLUTICASONE PROPIONATE 50 MCG/ACT NA SUSP: 2.0000 | Freq: Every day | NASAL | Status: DC

## 2012-04-29 MED ORDER — RIZATRIPTAN BENZOATE 10 MG PO TBDP: 10.0000 mg | ORAL_TABLET | ORAL | Status: DC | PRN

## 2012-04-29 MED ORDER — TRAZODONE HCL 100 MG PO TABS: 100.0000 mg | ORAL_TABLET | Freq: Every day | ORAL | Status: DC

## 2012-04-29 NOTE — Progress Notes (Signed)
  Subjective:    Patient ID: Ann Santos, female    DOB: 14-Aug-1980, 32 y.o.   MRN: [id number]  HPI  Ann Santos here today for a recheck on her medications.  She continues to be stable on her zoloft and trazadone.  She needs a refill on her maxalt.  She takes tramadol each month for menstrual cramps.  One bottle of 30 lasts her about 2 months.  Her last physical was with me in July 2013.  Review of Systems  All other systems reviewed and are negative.       Objective:   Physical Exam  Nursing note and vitals reviewed. Constitutional: She is oriented to person, place, and time. She appears well-developed and well-nourished.  Cardiovascular: Normal rate, regular rhythm and normal heart sounds.   Pulmonary/Chest: Effort normal and breath sounds normal.  Neurological: She is alert and oriented to person, place, and time.  Skin: Skin is warm and dry.  Psychiatric: She has a normal mood and affect. Her behavior is normal.          Assessment & Plan:  Anxiety and depression-stable on meds Migraines-helped with maxalt.  She is using the flonase daily which has helped for headache prevention. Dysmenorrhea-I advised 800mg  tid starting 2 days prior to menses and during painful days of menses and use tramadol in addition if needed.  I gave her 3 refills on this and it is ok for her to have #30 every 2 months.

## 2012-04-29 NOTE — Patient Instructions (Addendum)
Schedule CPE for July of 2014

## 2012-05-05 ENCOUNTER — Ambulatory Visit (INDEPENDENT_AMBULATORY_CARE_PROVIDER_SITE_OTHER): Payer: BC Managed Care – PPO | Admitting: Family Medicine

## 2012-05-05 VITALS — BP 127/92 | HR 76 | Temp 98.1°F | Resp 16 | Ht 61.0 in | Wt 294.0 lb

## 2012-05-05 DIAGNOSIS — J329 Chronic sinusitis, unspecified: Secondary | ICD-10-CM

## 2012-05-05 MED ORDER — LEVOFLOXACIN 500 MG PO TABS: 500.0000 mg | ORAL_TABLET | Freq: Every day | ORAL | Status: DC

## 2012-05-05 NOTE — Patient Instructions (Addendum)
Otitis Media with Effusion Otitis media with effusion is the presence of fluid in the middle ear. This is a common problem that often follows ear infections. It may be present for weeks or longer after the infection. Unlike an acute ear infection, otits media with effusion refers only to fluid behind the ear drum and not infection. Children with repeated ear and sinus infections and allergy problems are the most likely to get otitis media with effusion. CAUSES  The most frequent cause of the fluid buildup is dysfunction of the eustacian tubes. These are the tubes that drain fluid in the ears to the throat. SYMPTOMS   The main symptom of this condition is hearing loss. As a result, you or your child may:  Listen to the TV at a loud volume.  Not respond to questions.  Ask "what" often when spoken to.  There may be a sensation of fullness or pressure but usually not pain. DIAGNOSIS   Your caregiver will diagnose this condition by examining you or your child's ears.  Your caregiver may test the pressure in you or your child's ear with a tympanometer.  A hearing test may be conducted if the problem persists.  A caregiver will want to re-evaluate the condition periodically to see if it improves. TREATMENT   Treatment depends on the duration and the effects of the effusion.  Antibiotics, decongestants, nose drops, and cortisone-type drugs may not be helpful.  Children with persistent ear effusions may have delayed language. Children at risk for developmental delays in hearing, learning, and speech may require referral to a specialist earlier than children not at risk.  You or your child's caregiver may suggest a referral to an Ear, Nose, and Throat (ENT) surgeon for treatment. The following may help restore normal hearing:  Drainage of fluid.  Placement of ear tubes (tympanostomy tubes).  Removal of adenoids (adenoidectomy). HOME CARE INSTRUCTIONS   Avoid second hand  smoke.  Infants who are breast fed are less likely to have this condition.  Avoid feeding infants while laying flat.  Avoid known environmental allergens.  Be sure to see a caregiver or an ENT specialist for follow up.  Avoid people who are sick. SEEK MEDICAL CARE IF:   Hearing is not better in 3 months.  Hearing is worse.  Ear pain.  Drainage from the ear.  Dizziness. Document Released: 05/19/2004 Document Revised: 07/04/2011 Document Reviewed: 09/01/2009 [facility] Patient Information 2013 [place], [place]. Sinusitis Sinusitis is redness, soreness, and swelling (inflammation) of the paranasal sinuses. Paranasal sinuses are air pockets within the bones of your face (beneath the eyes, the middle of the forehead, or above the eyes). In healthy paranasal sinuses, mucus is able to drain out, and air is able to circulate through them by way of your nose. However, when your paranasal sinuses are inflamed, mucus and air can become trapped. This can allow bacteria and other germs to grow and cause infection. Sinusitis can develop quickly and last only a short time (acute) or continue over a long period (chronic). Sinusitis that lasts for more than 12 weeks is considered chronic.  CAUSES  Causes of sinusitis include:  Allergies.  Structural abnormalities, such as displacement of the cartilage that separates your nostrils (deviated septum), which can decrease the air flow through your nose and sinuses and affect sinus drainage.  Functional abnormalities, such as when the small hairs (cilia) that line your sinuses and help remove mucus do not work properly or are not present. SYMPTOMS  Symptoms of  acute and chronic sinusitis are the same. The primary symptoms are pain and pressure around the affected sinuses. Other symptoms include:  Upper toothache.  Earache.  Headache.  Bad breath.  Decreased sense of smell and taste.  A cough, which worsens when you are lying  flat.  Fatigue.  Fever.  Thick drainage from your nose, which often is green and may contain pus (purulent).  Swelling and warmth over the affected sinuses. DIAGNOSIS  Your caregiver will perform a physical exam. During the exam, your caregiver may:  Look in your nose for signs of abnormal growths in your nostrils (nasal polyps).  Tap over the affected sinus to check for signs of infection.  View the inside of your sinuses (endoscopy) with a special imaging device with a light attached (endoscope), which is inserted into your sinuses. If your caregiver suspects that you have chronic sinusitis, one or more of the following tests may be recommended:  Allergy tests.  Nasal culture A sample of mucus is taken from your nose and sent to a lab and screened for bacteria.  Nasal cytology A sample of mucus is taken from your nose and examined by your caregiver to determine if your sinusitis is related to an allergy. TREATMENT  Most cases of acute sinusitis are related to a viral infection and will resolve on their own within 10 days. Sometimes medicines are prescribed to help relieve symptoms (pain medicine, decongestants, nasal steroid sprays, or saline sprays).  However, for sinusitis related to a bacterial infection, your caregiver will prescribe antibiotic medicines. These are medicines that will help kill the bacteria causing the infection.  Rarely, sinusitis is caused by a fungal infection. In theses cases, your caregiver will prescribe antifungal medicine. For some cases of chronic sinusitis, surgery is needed. Generally, these are cases in which sinusitis recurs more than 3 times per year, despite other treatments. HOME CARE INSTRUCTIONS   Drink plenty of water. Water helps thin the mucus so your sinuses can drain more easily.  Use a humidifier.  Inhale steam 3 to 4 times a day (for example, sit in the bathroom with the shower running).  Apply a warm, moist washcloth to your face 3  to 4 times a day, or as directed by your caregiver.  Use saline nasal sprays to help moisten and clean your sinuses.  Take over-the-counter or prescription medicines for pain, discomfort, or fever only as directed by your caregiver. SEEK IMMEDIATE MEDICAL CARE IF:  You have increasing pain or severe headaches.  You have nausea, vomiting, or drowsiness.  You have swelling around your face.  You have vision problems.  You have a stiff neck.  You have difficulty breathing. MAKE SURE YOU:   Understand these instructions.  Will watch your condition.  Will get help right away if you are not doing well or get worse. Document Released: 04/11/2005 Document Revised: 07/04/2011 Document Reviewed: 04/26/2011 [facility] Patient Information 2013 [place], [place].

## 2012-05-05 NOTE — Progress Notes (Signed)
Patient ID: Ann Santos MRN: [id number], DOB: 1981/01/16, 32 y.o. Date of Encounter: 05/05/2012, 9:38 AM  Primary Physician: Tally Due, MD  Chief Complaint:  Chief Complaint  Patient presents with  . Otalgia    Bil ear pain- * 3-4 days  . Dizziness  . Neck Pain  . Fatigue    HPI: 32 y.o. year old female presents with 3 day history of nasal congestion, post nasal drip, sore throat, sinus pressure, and cough. Afebrile. No chills. Nasal congestion thick and green/yellow. Sinus pressure is the worst symptom. Cough is productive secondary to post nasal drip and not associated with time of day. Ears feel full, leading to sensation of muffled hearing. Has tried OTC cold preps without success. No GI complaints.  No recent antibiotics, recent travels, or sick contacts   No leg trauma, sedentary periods, h/o cancer, or tobacco use.  Past Medical History  Diagnosis Date  . Diverticulitis     Per pt. States she never had CT scan to confirm this  . Anxiety   . Diverticulosis     PT HOSPITALIZED AT [facility] October 03, 2011  . Umbilical hernia     planning surgical repair  . Constipation   . Abdominal distention   . Abdominal pain   . Blood in stool   . Rectal bleeding   . Hypertension     During pregnancy only     Home Meds: Prior to Admission medications   Medication Sig Start Date End Date Taking? Authorizing Provider  FIBER PO Take 1 tablet by mouth daily.   Yes Historical Provider, MD  fluticasone (FLONASE) 50 MCG/ACT nasal spray Place 2 sprays into the nose daily. 04/29/12 04/29/13 Yes Marzella Schlein McClung, PA-C  ibuprofen (ADVIL,MOTRIN) 200 MG tablet Take 600 mg by mouth every 6 (six) hours as needed. pain   Yes Historical Provider, MD  ondansetron (ZOFRAN-ODT) 8 MG disintegrating tablet Take 1 tablet (8 mg total) by mouth every 8 (eight) hours as needed for nausea. 01/31/12  Yes Heather M Marte, PA-C  rizatriptan (MAXALT-MLT) 10 MG disintegrating tablet Take 1 tablet (10 mg  total) by mouth as needed for migraine. May repeat in 2 hours if needed 04/29/12  Yes Anders Simmonds, PA-C  sertraline (ZOLOFT) 100 MG tablet Take 1 tablet (100 mg total) by mouth every evening. 04/29/12  Yes Anders Simmonds, PA-C  traMADol (ULTRAM) 50 MG tablet Take 1 tablet (50 mg total) by mouth every 8 (eight) hours as needed for pain. 04/29/12  Yes Anders Simmonds, PA-C  traZODone (DESYREL) 100 MG tablet Take 1 tablet (100 mg total) by mouth at bedtime. 04/29/12  Yes Marzella Schlein McClung, PA-C  amoxicillin-clavulanate (AUGMENTIN) 875-125 MG per tablet Take 1 tablet by mouth 2 (two) times daily. 01/30/12   Eleanore Delia Chimes, PA-C  azithromycin (ZITHROMAX) 250 MG tablet 2 TABS PO day 1. 1 pill po days 2-5 01/10/12   Rodolph Bong, MD    Allergies: No Known Allergies  History   Social History  . Marital Status: Married    Spouse Name: N/A    Number of Children: N/A  . Years of Education: N/A   Occupational History  . Not on file.   Social History Main Topics  . Smoking status: Never Smoker   . Smokeless tobacco: Never Used  . Alcohol Use: 0.6 oz/week    1 Cans of beer per week     Comment: twice a month  . Drug Use:  No  . Sexually Active: Yes   Other Topics Concern  . Not on file   Social History Narrative  . No narrative on file     Review of Systems: Constitutional: negative for chills, fever, night sweats or weight changes Cardiovascular: negative for chest pain or palpitations Respiratory: negative for hemoptysis, wheezing, or shortness of breath Abdominal: negative for abdominal pain, nausea, vomiting or diarrhea Dermatological: negative for rash Neurologic: negative for headache   Physical Exam: Blood pressure 127/92, pulse 76, temperature 98.1 F (36.7 C), temperature source Oral, resp. rate 16, height 5\' 1"  (1.549 m), weight 294 lb (133.358 kg), last menstrual period 04/27/2012, SpO2 98.00%., Body mass index is 55.55 kg/(m^2). General: Well developed, well nourished, in  no acute distress. Head: Normocephalic, atraumatic, eyes without discharge, sclera non-icteric, nares are congested. Bilateral auditory canals clear, TM's are without perforation, pearly grey with reflective cone of light bilaterally. Serous effusion bilaterally behind TM's, especially the right TM which is distorted. . Maxillary sinus TTP. Oral cavity moist, dentition normal. Posterior pharynx with post nasal drip and mild erythema. No peritonsillar abscess or tonsillar exudate. Neck: Supple. No thyromegaly. Full ROM. No lymphadenopathy. Lungs: Clear bilaterally to auscultation without wheezes, rales, or rhonchi. Breathing is unlabored.  Heart: RRR with S1 S2. No murmurs, rubs, or gallops appreciated. Msk:  Strength and tone normal for age. Extremities: No clubbing or cyanosis. No edema. Neuro: Alert and oriented X 3. Moves all extremities spontaneously. CNII-XII grossly in tact. Psych:  Responds to questions appropriately with a normal affect.   Labs:   ASSESSMENT AND PLAN:  32 y.o. year old female with sinusitis -  -Tylenol/Motrin prn -Rest/fluids -RTC precautions -RTC 3-5 days if no improvement  Signed, Elvina Sidle, MD 05/05/2012 9:38 AM

## 2012-05-13 ENCOUNTER — Telehealth: Payer: Self-pay

## 2012-05-13 MED ORDER — FLUCONAZOLE 150 MG PO TABS: 150.0000 mg | ORAL_TABLET | Freq: Once | ORAL | Status: DC

## 2012-05-13 NOTE — Telephone Encounter (Signed)
Called her to advise.  

## 2012-05-13 NOTE — Telephone Encounter (Signed)
Medication sent in. 

## 2012-05-13 NOTE — Telephone Encounter (Signed)
Has finished taking antibotic and now has a yeast infection. Can you call in diflucan for her?  [facility] on [address]  Thanks!

## 2012-05-25 ENCOUNTER — Encounter: Payer: Self-pay | Admitting: Family Medicine

## 2012-05-25 ENCOUNTER — Ambulatory Visit (INDEPENDENT_AMBULATORY_CARE_PROVIDER_SITE_OTHER): Payer: BC Managed Care – PPO | Admitting: Family Medicine

## 2012-05-25 VITALS — BP 136/84 | HR 99 | Temp 98.1°F | Resp 16 | Ht 62.0 in | Wt 296.2 lb

## 2012-05-25 DIAGNOSIS — H7290 Unspecified perforation of tympanic membrane, unspecified ear: Secondary | ICD-10-CM

## 2012-05-25 NOTE — Progress Notes (Signed)
Subjective:    Patient ID: Ann Santos, female    DOB: 05-22-1980, 32 y.o.   MRN: [id number]  HPI Ann Santos is a delightful 32 yo who was seen here 3 wks ago and diagnosed w/ bilateral ear infection and placed on levaquin.  10d later, she awoke with copious right ear drainage and decreased hearing on the right. She was seen at [facility] and warned of possible TM rupture but TM was unable to be seen so she was started on ciprodex ear drops and augmentin. The drainage has stopped and she is not having any pain but she cannot hear out of her Rt ear - it feels plugged up.  Now perhaps Left ear has started popping as well.   Not feeling ill anymore. Otherwise fine, just can't hear.  Past Medical History  Diagnosis Date  . Diverticulitis     Per pt. States she never had CT scan to confirm this  . Anxiety   . Diverticulosis     PT HOSPITALIZED AT [facility] October 03, 2011  . Umbilical hernia     planning surgical repair  . Constipation   . Abdominal distention   . Abdominal pain   . Blood in stool   . Rectal bleeding   . Hypertension     During pregnancy only   Current Outpatient Prescriptions on File Prior to Visit  Medication Sig Dispense Refill  . FIBER PO Take 1 tablet by mouth daily.      . fluticasone (FLONASE) 50 MCG/ACT nasal spray Place 2 sprays into the nose daily.  16 g  6  . ibuprofen (ADVIL,MOTRIN) 200 MG tablet Take 600 mg by mouth every 6 (six) hours as needed. pain      . rizatriptan (MAXALT-MLT) 10 MG disintegrating tablet Take 1 tablet (10 mg total) by mouth as needed for migraine. May repeat in 2 hours if needed  10 tablet  3  . sertraline (ZOLOFT) 100 MG tablet Take 1 tablet (100 mg total) by mouth every evening.  90 tablet  1  . traMADol (ULTRAM) 50 MG tablet Take 1 tablet (50 mg total) by mouth every 8 (eight) hours as needed for pain.  30 tablet  3  . traZODone (DESYREL) 100 MG tablet Take 1 tablet (100 mg total) by mouth at bedtime.  90 tablet  1  . amoxicillin-clavulanate  (AUGMENTIN) 875-125 MG per tablet Take 1 tablet by mouth 2 (two) times daily.  20 tablet  0  . azithromycin (ZITHROMAX) 250 MG tablet 2 TABS PO day 1. 1 pill po days 2-5  6 tablet  0  . fluconazole (DIFLUCAN) 150 MG tablet Take 1 tablet (150 mg total) by mouth once.  1 tablet  1  . levofloxacin (LEVAQUIN) 500 MG tablet Take 1 tablet (500 mg total) by mouth daily.  7 tablet  0  . ondansetron (ZOFRAN-ODT) 8 MG disintegrating tablet Take 1 tablet (8 mg total) by mouth every 8 (eight) hours as needed for nausea.  30 tablet  0   No Known Allergies  Review of Systems  Constitutional: Negative for fever, chills, diaphoresis, activity change, appetite change, fatigue and unexpected weight change.  HENT: Positive for hearing loss. Negative for ear pain, congestion, sore throat, facial swelling, rhinorrhea, trouble swallowing, neck pain, neck stiffness, voice change, postnasal drip, sinus pressure, tinnitus and ear discharge.   Neurological: Negative for dizziness, light-headedness and headaches.  Hematological: Negative for adenopathy.        BP 136/84  Pulse 99  Temp 98.1 F (36.7 C) (Oral)  Resp 16  Ht 5\' 2"  (1.575 m)  Wt 296 lb 3.2 oz (134.355 kg)  BMI 54.18 kg/m2  SpO2 99%  LMP 05/19/2012 Objective:   Physical Exam  Constitutional: She is oriented to person, place, and time. She appears well-developed and well-nourished. No distress.  HENT:  Head: Normocephalic and atraumatic.  Right Ear: External ear and ear canal normal. No drainage or swelling. Tympanic membrane is scarred, perforated, erythematous and retracted. No middle ear effusion.  Left Ear: External ear and ear canal normal. A middle ear effusion is present.  Ears:       Blue area is white and shiny, red area is bright erythema, central is mildly injected retracted TM  Eyes: Conjunctivae normal are normal. No scleral icterus.  Pulmonary/Chest: Effort normal.  Neurological: She is alert and oriented to person, place, and  time.  Skin: Skin is warm and dry. She is not diaphoretic. No erythema.  Psychiatric: She has a normal mood and affect. Her behavior is normal.      Assessment & Plan:  I suspect pt is healing from a prior TM rupture. Rec to cont using ciprodex drops and do not get any water into ear. Recheck in [redacted] wks along with audiometry and if any abnormality persists, refer to ENT

## 2012-05-25 NOTE — Patient Instructions (Addendum)
Tympanic Membrane Perforation The eardrum (tympanic membrane) protects the inner ear from the outside environment. In addition to protection, the eardrum allows you to hear by transmitting sound waves to the bones in your ear and then to the nervous system. The tympanic membrane is easily perforated, which may result in damage to the inner ear. SYMPTOMS   Sometimes there are no symptoms.  Decreased hearing.  Fluid drainage from ear.  Ear pain. CAUSES   Most commonly, a middle ear infection from built-up pressure.  Injury from a cotton swab.  Traumatic injury to the side of the head. RISK INCREASES WITH:  Frequent middle ear infections.  Use of cotton swabs. PREVENTION   Do not use cotton swabs to clean the ear canal.  If you have ear pain or pressure, see your caregiver to rule out an ear infection that needs treatment. TREATMENT  Protecting the inner ear and allowing the membrane to heal on its own is how tympanic membrane rupture is usually treated. Healing may take several weeks. In order to protect the inner ear, do not allow any fluid to enter the ear canal. Avoid being submerged in water. The use of ear drops may prevent an ear infection from developing, but they should be used with caution, as ear drops can also cause damage to the inner ear. It is important to follow up with your caregiver to confirm healing of the tympanic membrane. If the membrane does not heal, permanent hearing loss may occur. To avoid serious complications, tympanic membranes that do not heal on their own are repaired with surgery. Document Released: 04/11/2005 Document Revised: 07/04/2011 Document Reviewed: 07/24/2008 [facility] Patient Information 2013 [place], [place].

## 2012-06-03 ENCOUNTER — Encounter (HOSPITAL_COMMUNITY): Payer: Self-pay | Admitting: *Deleted

## 2012-06-03 ENCOUNTER — Emergency Department (HOSPITAL_COMMUNITY)
Admission: EM | Admit: 2012-06-03 | Discharge: 2012-06-04 | Disposition: A | Discharge status: Home / Self Care | Payer: BC Managed Care – PPO | Attending: Emergency Medicine | Admitting: Emergency Medicine

## 2012-06-03 DIAGNOSIS — R112 Nausea with vomiting, unspecified: Secondary | ICD-10-CM

## 2012-06-03 DIAGNOSIS — R197 Diarrhea, unspecified: Secondary | ICD-10-CM | POA: Insufficient documentation

## 2012-06-03 DIAGNOSIS — Z8679 Personal history of other diseases of the circulatory system: Secondary | ICD-10-CM | POA: Insufficient documentation

## 2012-06-03 DIAGNOSIS — Z8719 Personal history of other diseases of the digestive system: Secondary | ICD-10-CM | POA: Insufficient documentation

## 2012-06-03 DIAGNOSIS — Z79899 Other long term (current) drug therapy: Secondary | ICD-10-CM | POA: Insufficient documentation

## 2012-06-03 DIAGNOSIS — F411 Generalized anxiety disorder: Secondary | ICD-10-CM | POA: Insufficient documentation

## 2012-06-03 LAB — COMPREHENSIVE METABOLIC PANEL
ALT: 18 U/L (ref 0–35)
AST: 20 U/L (ref 0–37)
Albumin: 3.8 g/dL (ref 3.5–5.2)
Alkaline Phosphatase: 57 U/L (ref 39–117)
BUN: 12 mg/dL (ref 6–23)
CO2: 21 mEq/L (ref 19–32)
Calcium: 8.7 mg/dL (ref 8.4–10.5)
Chloride: 99 mEq/L (ref 96–112)
Creatinine, Ser: 0.5 mg/dL (ref 0.50–1.10)
GFR calc Af Amer: >90 mL/min (ref >90)
GFR calc non Af Amer: >90 mL/min (ref >90)
Glucose, Bld: 131 mg/dL — ABNORMAL HIGH (ref 70–99)
Potassium: 3.4 mEq/L — ABNORMAL LOW (ref 3.5–5.1)
Sodium: 135 mEq/L (ref 135–145)
Total Bilirubin: 0.8 mg/dL (ref 0.3–1.2)
Total Protein: 7.8 g/dL (ref 6.0–8.3)

## 2012-06-03 LAB — CBC WITH DIFFERENTIAL/PLATELET
Basophils Absolute: 0 10*3/uL (ref 0.0–0.1)
Basophils Relative: 0 % (ref 0–1)
Eosinophils Absolute: 0.1 10*3/uL (ref 0.0–0.7)
Eosinophils Relative: 0 % (ref 0–5)
HCT: 42.1 % (ref 36.0–46.0)
Hemoglobin: 15 g/dL (ref 12.0–15.0)
Lymphocytes Relative: 3 % — ABNORMAL LOW (ref 12–46)
Lymphs Abs: 0.6 10*3/uL — ABNORMAL LOW (ref 0.7–4.0)
MCH: 30.7 pg (ref 26.0–34.0)
MCHC: 35.6 g/dL (ref 30.0–36.0)
MCV: 86.1 fL (ref 78.0–100.0)
Monocytes Absolute: 0.4 10*3/uL (ref 0.1–1.0)
Monocytes Relative: 2 % — ABNORMAL LOW (ref 3–12)
Neutro Abs: 16 10*3/uL — ABNORMAL HIGH (ref 1.7–7.7)
Neutrophils Relative %: 94 % — ABNORMAL HIGH (ref 43–77)
Platelets: 337 10*3/uL (ref 150–400)
RBC: 4.89 MIL/uL (ref 3.87–5.11)
RDW: 12.4 % (ref 11.5–15.5)
WBC: 17.1 10*3/uL — ABNORMAL HIGH (ref 4.0–10.5)

## 2012-06-03 LAB — LIPASE, BLOOD: Lipase: 16 U/L (ref 11–59)

## 2012-06-03 MED ORDER — SODIUM CHLORIDE 0.9 % IV BOLUS (SEPSIS)
1000.0000 mL | Freq: Once | INTRAVENOUS | Status: AC
  Administered 2012-06-03: 1000 mL via INTRAVENOUS

## 2012-06-03 MED ORDER — HYDROMORPHONE HCL PF 1 MG/ML IJ SOLN
1.0000 mg | Freq: Once | INTRAMUSCULAR | Status: AC
  Administered 2012-06-03: 1 mg via INTRAVENOUS
  Filled 2012-06-03: qty 1

## 2012-06-03 MED ORDER — ONDANSETRON HCL 4 MG/2ML IJ SOLN
4.0000 mg | Freq: Once | INTRAMUSCULAR | Status: AC
  Administered 2012-06-03: 4 mg via INTRAVENOUS
  Filled 2012-06-03: qty 2

## 2012-06-03 NOTE — ED Notes (Signed)
Pt states that she has been having diarrhea since last night and n/v since this afternoon. Denies exposure to any infected individuals.

## 2012-06-03 NOTE — ED Provider Notes (Signed)
History     CSN: [id number]  Arrival date & time 06/03/12  2137   First MD Initiated Contact with Patient 06/03/12 2147      No chief complaint on file.    HPI  The patient presents with several hours of nausea, vomiting.  Symptoms began approximately 5 hours ago.  Since onset the nausea has been persistent with innumerable episodes of emesis.  There have been several episodes of diarrhea as well.  There is negligible abdominal pain, but the patient's vomiting there is epigastric discomfort. She states that prior to this she was in her usual state of health.  She has positive sick contacts. She states that the symptoms are similar to those she for one year ago, which is diagnosed with noro virus. No clear alleviating or exacerbating factors.  The patient is also anorexic.  Past Medical History  Diagnosis Date  . Diverticulitis     Per pt. States she never had CT scan to confirm this  . Anxiety   . Diverticulosis     PT HOSPITALIZED AT [facility] October 03, 2011  . Umbilical hernia     planning surgical repair  . Constipation   . Abdominal distention   . Abdominal pain   . Blood in stool   . Rectal bleeding   . Hypertension     During pregnancy only    Past Surgical History  Procedure Laterality Date  . Colonoscopy  10/07/2011    Procedure: COLONOSCOPY;  Surgeon: Theda Belfast, MD;  Location: [facility] ENDOSCOPY;  Service: Endoscopy;  Laterality: N/A;  . Esophagogastroduodenoscopy  10/07/2011    Procedure: ESOPHAGOGASTRODUODENOSCOPY (EGD);  Surgeon: Theda Belfast, MD;  Location: [facility];  Service: Endoscopy;  Laterality: N/A;  . Cesarean section  05/28/09  . Scar revision  09/17/09    c-section scar revision  . Umbilical hernia repair  11/01/2011    Procedure: HERNIA REPAIR UMBILICAL ADULT;  Surgeon: Velora Heckler, MD;  Location: [facility] ORS;  Service: General;  Laterality: N/A;    Family History  Problem Relation Age of Onset  . Diabetes Mother   . Hypertension Mother   . Ulcerative  colitis Father   . Hypertension Father   . Cancer Maternal Grandmother     lung    History  Substance Use Topics  . Smoking status: Never Smoker   . Smokeless tobacco: Never Used  . Alcohol Use: 0.6 oz/week    1 Cans of beer per week     Comment: twice a month    OB History   Grav Para Term Preterm Abortions TAB SAB Ect Mult Living                  Review of Systems  Constitutional:       Per HPI, otherwise negative  HENT:       Per HPI, otherwise negative  Respiratory:       Per HPI, otherwise negative  Cardiovascular:       Per HPI, otherwise negative  Gastrointestinal: Positive for nausea, vomiting, abdominal pain and diarrhea.  Endocrine:       Negative aside from HPI  Genitourinary:       Neg aside from HPI   Musculoskeletal:       Per HPI, otherwise negative  Skin: Negative.   Neurological: Negative for syncope.    Allergies  Review of patient's allergies indicates no known allergies.  Home Medications   Current Outpatient Rx  Name  Route  Sig  Dispense  Refill  . acidophilus (RISAQUAD) CAPS   Oral   Take 1 capsule by mouth daily.         . fluticasone (FLONASE) 50 MCG/ACT nasal spray   Nasal   Place 2 sprays into the nose daily.   16 g   6   . rizatriptan (MAXALT-MLT) 10 MG disintegrating tablet   Oral   Take 1 tablet (10 mg total) by mouth as needed for migraine. May repeat in 2 hours if needed   10 tablet   3   . sertraline (ZOLOFT) 100 MG tablet   Oral   Take 1 tablet (100 mg total) by mouth every evening.   90 tablet   1   . traMADol (ULTRAM) 50 MG tablet   Oral   Take 1 tablet (50 mg total) by mouth every 8 (eight) hours as needed for pain.   30 tablet   3     May fill once every 2 months   . traZODone (DESYREL) 100 MG tablet   Oral   Take 1 tablet (100 mg total) by mouth at bedtime.   90 tablet   1     BP 142/87  Pulse 114  Temp(Src) 98.9 F (37.2 C) (Oral)  Resp 16  Ht 5\' 1"  (1.549 m)  Wt 287 lb (130.182 kg)   BMI 54.26 kg/m2  SpO2 98%  LMP 05/19/2012  Physical Exam  Nursing note and vitals reviewed. Constitutional: She is oriented to person, place, and time. She appears well-developed and well-nourished. No distress.  HENT:  Head: Normocephalic and atraumatic.  Eyes: Conjunctivae and EOM are normal.  Cardiovascular: Normal rate and regular rhythm.   Pulmonary/Chest: Effort normal and breath sounds normal. No stridor. No respiratory distress.  Abdominal: Normal appearance. She exhibits no distension. There is no tenderness. There is no rigidity, no rebound and no guarding.  Musculoskeletal: She exhibits no edema.  Neurological: She is alert and oriented to person, place, and time. No cranial nerve deficit.  Skin: Skin is warm and dry.  Psychiatric: She has a normal mood and affect.    ED Course  Procedures (including critical care time)  Labs Reviewed  CBC WITH DIFFERENTIAL  COMPREHENSIVE METABOLIC PANEL  LIPASE, BLOOD   No results found.   No diagnosis found.  Update: Patient appears more comfortable, vital signs remained stable  MDM  This patient presents with several hours of nausea, vomiting.  The patient initially had epigastric pain, this was not present on my exam.  Patient is afebrile.  His mild leukocytosis, but is in no distress.  Following IV fluids, antiemetics, the patient was improved.  We discussed return precautions, the need for primary care followup, and the patient was discharged in stable condition.    Gerhard Munch, MD 06/04/12 0005

## 2012-06-03 NOTE — ED Notes (Signed)
Vomiting since 5 pm; diarrhea; denies abd pain

## 2012-06-04 ENCOUNTER — Other Ambulatory Visit: Payer: Self-pay | Admitting: Physician Assistant

## 2012-06-04 MED ORDER — ONDANSETRON HCL 4 MG/2ML IJ SOLN
4.0000 mg | Freq: Once | INTRAMUSCULAR | Status: AC
  Administered 2012-06-04: 4 mg via INTRAVENOUS
  Filled 2012-06-04: qty 2

## 2012-06-04 MED ORDER — ONDANSETRON 4 MG PO TBDP: 4.0000 mg | ORAL_TABLET | Freq: Three times a day (TID) | ORAL | Status: DC | PRN

## 2012-06-17 ENCOUNTER — Emergency Department (HOSPITAL_COMMUNITY): Payer: BC Managed Care – PPO

## 2012-06-17 ENCOUNTER — Encounter (HOSPITAL_COMMUNITY): Payer: Self-pay | Admitting: Emergency Medicine

## 2012-06-17 ENCOUNTER — Emergency Department (HOSPITAL_COMMUNITY)
Admission: EM | Admit: 2012-06-17 | Discharge: 2012-06-18 | Disposition: A | Discharge status: Home / Self Care | Payer: BC Managed Care – PPO | Attending: Emergency Medicine | Admitting: Emergency Medicine

## 2012-06-17 DIAGNOSIS — Z8719 Personal history of other diseases of the digestive system: Secondary | ICD-10-CM | POA: Insufficient documentation

## 2012-06-17 DIAGNOSIS — Z3202 Encounter for pregnancy test, result negative: Secondary | ICD-10-CM | POA: Insufficient documentation

## 2012-06-17 DIAGNOSIS — K429 Umbilical hernia without obstruction or gangrene: Secondary | ICD-10-CM | POA: Insufficient documentation

## 2012-06-17 DIAGNOSIS — F411 Generalized anxiety disorder: Secondary | ICD-10-CM | POA: Insufficient documentation

## 2012-06-17 DIAGNOSIS — Z79899 Other long term (current) drug therapy: Secondary | ICD-10-CM | POA: Insufficient documentation

## 2012-06-17 DIAGNOSIS — E669 Obesity, unspecified: Secondary | ICD-10-CM | POA: Insufficient documentation

## 2012-06-17 DIAGNOSIS — Z8742 Personal history of other diseases of the female genital tract: Secondary | ICD-10-CM | POA: Insufficient documentation

## 2012-06-17 DIAGNOSIS — R109 Unspecified abdominal pain: Secondary | ICD-10-CM

## 2012-06-17 LAB — BASIC METABOLIC PANEL
BUN: 11 mg/dL (ref 6–23)
CO2: 27 mEq/L (ref 19–32)
Calcium: 9 mg/dL (ref 8.4–10.5)
Chloride: 102 mEq/L (ref 96–112)
Creatinine, Ser: 0.65 mg/dL (ref 0.50–1.10)
GFR calc Af Amer: >90 mL/min (ref >90)
GFR calc non Af Amer: >90 mL/min (ref >90)
Glucose, Bld: 97 mg/dL (ref 70–99)
Potassium: 3.9 mEq/L (ref 3.5–5.1)
Sodium: 138 mEq/L (ref 135–145)

## 2012-06-17 LAB — CBC WITH DIFFERENTIAL/PLATELET
Basophils Absolute: 0 10*3/uL (ref 0.0–0.1)
Basophils Relative: 0 % (ref 0–1)
Eosinophils Absolute: 0.1 10*3/uL (ref 0.0–0.7)
Eosinophils Relative: 1 % (ref 0–5)
HCT: 42.5 % (ref 36.0–46.0)
Hemoglobin: 14 g/dL (ref 12.0–15.0)
Lymphocytes Relative: 37 % (ref 12–46)
Lymphs Abs: 3.4 10*3/uL (ref 0.7–4.0)
MCH: 28.9 pg (ref 26.0–34.0)
MCHC: 32.9 g/dL (ref 30.0–36.0)
MCV: 87.6 fL (ref 78.0–100.0)
Monocytes Absolute: 0.6 10*3/uL (ref 0.1–1.0)
Monocytes Relative: 7 % (ref 3–12)
Neutro Abs: 5.1 10*3/uL (ref 1.7–7.7)
Neutrophils Relative %: 55 % (ref 43–77)
Platelets: 396 10*3/uL (ref 150–400)
RBC: 4.85 MIL/uL (ref 3.87–5.11)
RDW: 12.4 % (ref 11.5–15.5)
WBC: 9.2 10*3/uL (ref 4.0–10.5)

## 2012-06-17 LAB — URINALYSIS, ROUTINE W REFLEX MICROSCOPIC
Bilirubin Urine: NEGATIVE
Glucose, UA: NEGATIVE mg/dL
Hgb urine dipstick: NEGATIVE
Ketones, ur: NEGATIVE mg/dL
Nitrite: NEGATIVE
Protein, ur: NEGATIVE mg/dL
Specific Gravity, Urine: 1.021 (ref 1.005–1.030)
Urobilinogen, UA: 0.2 mg/dL (ref 0.0–1.0)
pH: 6 (ref 5.0–8.0)

## 2012-06-17 LAB — URINE MICROSCOPIC-ADD ON

## 2012-06-17 LAB — PREGNANCY, URINE: Preg Test, Ur: NEGATIVE

## 2012-06-17 MED ORDER — SODIUM CHLORIDE 0.9 % IV BOLUS (SEPSIS)
500.0000 mL | Freq: Once | INTRAVENOUS | Status: AC
  Administered 2012-06-17: 500 mL via INTRAVENOUS

## 2012-06-17 MED ORDER — MORPHINE SULFATE 4 MG/ML IJ SOLN
4.0000 mg | Freq: Once | INTRAMUSCULAR | Status: AC
  Administered 2012-06-17: 4 mg via INTRAVENOUS
  Filled 2012-06-17: qty 1

## 2012-06-17 MED ORDER — ONDANSETRON HCL 4 MG/2ML IJ SOLN
4.0000 mg | Freq: Once | INTRAMUSCULAR | Status: AC
  Administered 2012-06-17: 4 mg via INTRAVENOUS
  Filled 2012-06-17: qty 2

## 2012-06-17 NOTE — ED Provider Notes (Signed)
History     CSN: [id number]  Arrival date & time 06/17/12  2029   First MD Initiated Contact with Patient 06/17/12 2140      Chief Complaint  Patient presents with  . Abdominal Pain    (Consider location/radiation/quality/duration/timing/severity/associated sxs/prior treatment) HPI  Patient to the ER with complaints of periumbilical abdominal pains that radiate towards the sides.. She describes the pain as severe and stabbing. She says that she is passing a large amount of stool but in very small and frequent quantities. The stool is solid. She has a history of an incarcerated umbilical hernia, diverticulitis, anxiety,constipation, abd distention, blood in stool, rectal bleeding and hypertension. Her last CT scan of the abdomen was back in June 2013. She has not had any medications to take ay home. She has a GI doctor but has not seen them since last year at her post-op f/u appt. The pain has been persisting for a few days but today it got acutely worse.    Past Medical History  Diagnosis Date  . Diverticulitis     Per pt. States she never had CT scan to confirm this  . Anxiety   . Diverticulosis     PT HOSPITALIZED AT [facility] October 03, 2011  . Umbilical hernia     planning surgical repair  . Constipation   . Abdominal distention   . Abdominal pain   . Blood in stool   . Rectal bleeding   . Hypertension     During pregnancy only    Past Surgical History  Procedure Laterality Date  . Colonoscopy  10/07/2011    Procedure: COLONOSCOPY;  Surgeon: Theda Belfast, MD;  Location: [facility];  Service: Endoscopy;  Laterality: N/A;  . Esophagogastroduodenoscopy  10/07/2011    Procedure: ESOPHAGOGASTRODUODENOSCOPY (EGD);  Surgeon: Theda Belfast, MD;  Location: [facility];  Service: Endoscopy;  Laterality: N/A;  . Cesarean section  05/28/09  . Scar revision  09/17/09    c-section scar revision  . Umbilical hernia repair  11/01/2011    Procedure: HERNIA REPAIR UMBILICAL ADULT;   Surgeon: Velora Heckler, MD;  Location: [facility];  Service: General;  Laterality: N/A;    Family History  Problem Relation Age of Onset  . Diabetes Mother   . Hypertension Mother   . Ulcerative colitis Father   . Hypertension Father   . Cancer Maternal Grandmother     lung    History  Substance Use Topics  . Smoking status: Never Smoker   . Smokeless tobacco: Never Used  . Alcohol Use: 0.6 oz/week    1 Cans of beer per week     Comment: twice a month    OB History   Grav Para Term Preterm Abortions TAB SAB Ect Mult Living                  Review of Systems  Review of Systems  Gen: no weight loss, fevers, chills, night sweats  Eyes: no discharge or drainage, no occular pain or visual changes  Nose: no epistaxis or rhinorrhea  Mouth: no dental pain, no sore throat  Neck: no neck pain  Lungs:No wheezing, coughing or hemoptysis CV: no chest pain, palpitations, dependent edema or orthopnea  Abd: + abdominal pain, nausea, no vomiting or diarhea GU: no dysuria or gross hematuria  MSK:  No abnormalities  Neuro: no headache, no focal neurologic deficits  Skin: no abnormalities Psyche: negative.   Allergies  Review of patient's allergies indicates no  known allergies.  Home Medications   Current Outpatient Rx  Name  Route  Sig  Dispense  Refill  . acidophilus (RISAQUAD) CAPS   Oral   Take 1 capsule by mouth daily.         . fluticasone (FLONASE) 50 MCG/ACT nasal spray   Nasal   Place 2 sprays into the nose daily.   16 g   6   . rizatriptan (MAXALT-MLT) 10 MG disintegrating tablet   Oral   Take 1 tablet (10 mg total) by mouth as needed for migraine. May repeat in 2 hours if needed   10 tablet   3   . sertraline (ZOLOFT) 100 MG tablet   Oral   Take 1 tablet (100 mg total) by mouth every evening.   90 tablet   1   . traMADol (ULTRAM) 50 MG tablet   Oral   Take 1 tablet (50 mg total) by mouth every 8 (eight) hours as needed for pain.   30 tablet   3      May fill once every 2 months   . traZODone (DESYREL) 100 MG tablet   Oral   Take 1 tablet (100 mg total) by mouth at bedtime.   90 tablet   1   . HYDROcodone-acetaminophen (NORCO/VICODIN) 5-325 MG per tablet   Oral   Take 1 tablet by mouth every 4 (four) hours as needed for pain.   10 tablet   0   . ondansetron (ZOFRAN) 4 MG tablet   Oral   Take 1 tablet (4 mg total) by mouth every 6 (six) hours.   12 tablet   0     BP 149/89  Pulse 80  Temp(Src) 98 F (36.7 C) (Oral)  Resp 16  SpO2 99%  LMP 05/19/2012  Physical Exam  Nursing note and vitals reviewed. Constitutional: She appears well-developed and well-nourished. No distress.  HENT:  Head: Normocephalic and atraumatic.  Eyes: Pupils are equal, round, and reactive to light.  Neck: Normal range of motion. Neck supple.  Cardiovascular: Normal rate and regular rhythm.   Pulmonary/Chest: Effort normal.  Abdominal: Soft. There is tenderness (exam limited because of obese body habitus) in the periumbilical area. There is no rigidity, no guarding, no tenderness at McBurney's point and negative Murphy's sign. Hernia: no palpable hernias.    Neurological: She is alert.  Skin: Skin is warm and dry.    ED Course  Procedures (including critical care time)  Labs Reviewed  URINALYSIS, ROUTINE W REFLEX MICROSCOPIC - Abnormal; Notable for the following:    Leukocytes, UA TRACE (*)    All other components within normal limits  PREGNANCY, URINE  CBC WITH DIFFERENTIAL  BASIC METABOLIC PANEL  URINE MICROSCOPIC-ADD ON   Ct Abdomen Pelvis W Contrast  06/18/2012  *RADIOLOGY REPORT*  Clinical Data: Right lower quadrant pain.  White cell count 9.2. White cells in the urine.  Rectal bleeding.  CT ABDOMEN AND PELVIS WITH CONTRAST  Technique:  Multidetector CT imaging of the abdomen and pelvis was performed following the standard protocol during bolus administration of intravenous contrast.  Contrast: OMNIPAQUE IOHEXOL 300 MG/ML   SOLN  Comparison: 10/06/2011.  Findings: The lung bases are clear.  The liver, spleen, gallbladder, pancreas, adrenal glands, kidneys, abdominal aorta, and retroperitoneal lymph nodes are unremarkable. The stomach, small bowel, and colon are not abnormally distended. No free air or free fluid in the abdomen.  Broad-based umbilical hernia containing fat.  There appear to have been postoperative  changes in the hernia compared to previous study.  No evidence of fat necrosis.  Pelvis:  The uterus and ovaries are not enlarged.  No free or loculated pelvic fluid collections.  The bladder wall is not thickened.  No abnormal pelvic lymphadenopathy.  No evidence of diverticulitis.  The appendix is normal.  Normal alignment of the lumbar vertebrae.  IMPRESSION: No acute process demonstrated in the abdomen or pelvis.  Broad- based umbilical hernia containing fat without fat necrosis.  Normal appendix.   Original Report Authenticated By: Burman Nieves, M.D.    Dg Abd 2 Views  06/17/2012  *RADIOLOGY REPORT*  Clinical Data: Abdominal pain, nausea and constipation. Periumbilical pain.  ABDOMEN - 2 VIEW  Comparison: CT abdomen and pelvis 10/06/2011.  Findings: Scattered gas and stool in the colon.  No small or large bowel distension.  No free intra-abdominal air.  No abnormal air fluid levels.  No radiopaque stones.  Visualized bones appear intact.  Calcified phlebolith in the pelvis.  IMPRESSION: Nonobstructive bowel gas pattern.   Original Report Authenticated By: Burman Nieves, M.D.      1. Abdominal pain   2. Umbilical hernia       MDM  Pt given 4mg  IV Morphine and 4mg  IV Zofran. Pain   Films were normal. However the patient's pain was difficult to control therefore CT of the abdomen pelvis was reordered to make sure that the hernia had not returned.  Ct abd/pelv is not acute.  Will refer back to Dr. Elnoria Howard, GI because umbilical hernia is back.  Nausea and pain medication rx.  Pt has been advised of  the symptoms that warrant their return to the ED. Patient has voiced understanding and has agreed to follow-up with the PCP or specialist.       Dorthula Matas, PA 06/18/12 [phone]

## 2012-06-17 NOTE — ED Notes (Signed)
MD at bedside.  Pt requesting nausea medication.

## 2012-06-17 NOTE — ED Notes (Signed)
Pt alert, arrives from home, c/o right lower quad pain, directs over right ovary, states "i always have to go", "it feels uncomfortable", referring to resp habits, resp even unlabored, skin pwd

## 2012-06-18 ENCOUNTER — Emergency Department (HOSPITAL_COMMUNITY): Payer: BC Managed Care – PPO

## 2012-06-18 MED ORDER — IOHEXOL 300 MG/ML  SOLN
50.0000 mL | Freq: Once | INTRAMUSCULAR | Status: AC | PRN
  Administered 2012-06-18: 50 mL via ORAL

## 2012-06-18 MED ORDER — IOHEXOL 300 MG/ML  SOLN
100.0000 mL | Freq: Once | INTRAMUSCULAR | Status: AC | PRN
  Administered 2012-06-18: 100 mL via INTRAVENOUS

## 2012-06-18 MED ORDER — ONDANSETRON 4 MG PO TBDP
4.0000 mg | ORAL_TABLET | Freq: Once | ORAL | Status: AC
  Administered 2012-06-18: 4 mg via ORAL
  Filled 2012-06-18: qty 1

## 2012-06-18 MED ORDER — ONDANSETRON HCL 4 MG PO TABS: 4.0000 mg | ORAL_TABLET | Freq: Four times a day (QID) | ORAL | Status: DC

## 2012-06-18 MED ORDER — HYDROCODONE-ACETAMINOPHEN 5-325 MG PO TABS: 1.0000 | ORAL_TABLET | ORAL | Status: DC | PRN

## 2012-06-22 ENCOUNTER — Telehealth (INDEPENDENT_AMBULATORY_CARE_PROVIDER_SITE_OTHER): Payer: Self-pay

## 2012-06-22 ENCOUNTER — Other Ambulatory Visit: Payer: Self-pay | Admitting: Physician Assistant

## 2012-06-22 NOTE — Telephone Encounter (Signed)
The pt called reporting abdominal pain for about 3 weeks.  She has had some nausea.  She had a previous hernia repair by Dr Gerrit Friends last year.  She is obese so she can't tell if it's a recurrence.  She went to the ER Sunday night.  They did a ct scan.  She isn't sure where to go or follow up.  I reviewed the er note and CT.  The note said she has a recurrent umbilical hernia and to refer to Dr Elnoria Howard.  I told her that didn't make sense.  The CT showed umbilical hernia with fat.  I told her she should see Dr Gerrit Friends who did the surgery.  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  I saw an opening Monday morning that may be usable but I will send this to Dr Ardine Eng nurse to review.

## 2012-06-22 NOTE — ED Provider Notes (Signed)
Medical screening examination/treatment/procedure(s) were performed by non-physician practitioner and as supervising physician I was immediately available for consultation/collaboration.  Doug Sou, MD 06/22/12 [phone]

## 2012-06-22 NOTE — Telephone Encounter (Signed)
You can also reach her at work [phone] ext [phone]

## 2012-07-04 ENCOUNTER — Encounter (INDEPENDENT_AMBULATORY_CARE_PROVIDER_SITE_OTHER): Payer: BC Managed Care – PPO | Admitting: Surgery

## 2012-07-23 ENCOUNTER — Ambulatory Visit (INDEPENDENT_AMBULATORY_CARE_PROVIDER_SITE_OTHER): Payer: BC Managed Care – PPO | Admitting: Family Medicine

## 2012-07-23 ENCOUNTER — Other Ambulatory Visit: Payer: Self-pay | Admitting: Physician Assistant

## 2012-07-23 VITALS — BP 128/74 | HR 83 | Temp 98.1°F | Resp 16 | Ht 62.5 in | Wt 286.0 lb

## 2012-07-23 DIAGNOSIS — N946 Dysmenorrhea, unspecified: Secondary | ICD-10-CM

## 2012-07-23 DIAGNOSIS — J069 Acute upper respiratory infection, unspecified: Secondary | ICD-10-CM

## 2012-07-23 MED ORDER — TRAMADOL HCL 50 MG PO TABS: 50.0000 mg | ORAL_TABLET | Freq: Three times a day (TID) | ORAL | Status: DC | PRN

## 2012-07-23 NOTE — Patient Instructions (Addendum)
Keep a close eye on your symptoms.  If you are getting worse or not getting better, or if your ear pain or hearing issues get worse please let us know.  Your ear drum appears to have healed, but if you notice that your hearing is not ok we can refer you to an ENT specialist.

## 2012-07-23 NOTE — Progress Notes (Signed)
[facility] [address] [place] [address] [phone]  Date:  07/23/2012   Name:  Ann Santos   DOB:  05-May-1980   MRN:  [id number]  PCP:  Tally Due, MD    Chief Complaint: Otalgia   History of Present Illness:  Ann Santos is a 32 y.o. very pleasant female patient who presents with the following:  Noted to have a right TM rupture in January of this year.  2 days she awoke with some right sided ear/ neck pain.  She thought she might have a crick in her neck. She has noted that her ears are popping again, and she feels a little tired. She notes some sinus congestion.  She does have sneezing. Tried a zyrtec.  No ST, she does have a mild cough. She has not noted a fever.  No GI symptoms.    She has not yet seen ENT for her ruptured TM.  Her hearing seems to be ok right now.  Her daughter is ill as well.    She also uses tramadol prn for dysmenorrhea.  She had a problem in the past with her husband taking her tramadol and calling [facility] on her account.  Her husband is now in treatment and has been for about 6 weeks.  She has a safe place to lock up her tramadol now.  She would like a paper rx to ensure the rx is secure.  She had an rx for #30 with 3 RF, this was given in 04/29/2012.    Patient Active Problem List  Diagnosis  . Anxiety  . BRBPR (bright red blood per rectum)  . Abdominal pain  . Umbilical hernia  . Wound healing, delayed  . URI (upper respiratory infection)    Past Medical History  Diagnosis Date  . Diverticulitis     Per pt. States she never had CT scan to confirm this  . Anxiety   . Diverticulosis     PT HOSPITALIZED AT [facility] October 03, 2011  . Umbilical hernia     planning surgical repair  . Constipation   . Abdominal distention   . Abdominal pain   . Blood in stool   . Rectal bleeding   . Hypertension     During pregnancy only    Past Surgical History  Procedure Laterality Date  . Colonoscopy  10/07/2011   Procedure: COLONOSCOPY;  Surgeon: Theda Belfast, MD;  Location: [facility];  Service: Endoscopy;  Laterality: N/A;  . Esophagogastroduodenoscopy  10/07/2011    Procedure: ESOPHAGOGASTRODUODENOSCOPY (EGD);  Surgeon: Theda Belfast, MD;  Location: [facility];  Service: Endoscopy;  Laterality: N/A;  . Cesarean section  05/28/09  . Scar revision  09/17/09    c-section scar revision  . Umbilical hernia repair  11/01/2011    Procedure: HERNIA REPAIR UMBILICAL ADULT;  Surgeon: Velora Heckler, MD;  Location: [facility];  Service: General;  Laterality: N/A;    History  Substance Use Topics  . Smoking status: Never Smoker   . Smokeless tobacco: Never Used  . Alcohol Use: 0.6 oz/week    1 Cans of beer per week     Comment: twice a month    Family History  Problem Relation Age of Onset  . Diabetes Mother   . Hypertension Mother   . Ulcerative colitis Father   . Hypertension Father   . Cancer Maternal Grandmother     lung    No Known Allergies  Medication list has been reviewed and updated.  Current Outpatient Prescriptions on File Prior to Visit  Medication Sig Dispense Refill  . fluticasone (FLONASE) 50 MCG/ACT nasal spray Place 2 sprays into the nose daily.  16 g  6  . sertraline (ZOLOFT) 100 MG tablet Take 1 tablet (100 mg total) by mouth every evening.  90 tablet  1  . traMADol (ULTRAM) 50 MG tablet Take 1 tablet (50 mg total) by mouth every 8 (eight) hours as needed for pain.  30 tablet  3  . traZODone (DESYREL) 100 MG tablet Take 1 tablet (100 mg total) by mouth at bedtime.  90 tablet  1  . acidophilus (RISAQUAD) CAPS Take 1 capsule by mouth daily.      Marland Kitchen HYDROcodone-acetaminophen (NORCO/VICODIN) 5-325 MG per tablet Take 1 tablet by mouth every 4 (four) hours as needed for pain.  10 tablet  0  . ondansetron (ZOFRAN) 4 MG tablet Take 1 tablet (4 mg total) by mouth every 6 (six) hours.  12 tablet  0  . rizatriptan (MAXALT-MLT) 10 MG disintegrating tablet Take 1 tablet (10 mg total) by  mouth as needed for migraine. May repeat in 2 hours if needed  10 tablet  3   No current facility-administered medications on file prior to visit.    Review of Systems:  As per HPI- otherwise negative.   Physical Examination: Filed Vitals:   07/23/12 0757  BP: 128/74  Pulse: 83  Temp: 98.1 F (36.7 C)  Resp: 16   Filed Vitals:   07/23/12 0757  Height: 5' 2.5" (1.588 m)  Weight: 286 lb (129.729 kg)   Body mass index is 51.44 kg/(m^2). Ideal Body Weight: Weight in (lb) to have BMI = 25: 138.6  GEN: WDWN, NAD, Non-toxic, A & O x 3, obese HEENT: Atraumatic, Normocephalic. Neck supple. No masses, No LAD.  Bilateral TM show no sign of AOM, the right appears to have a history of rupture that has healed, oropharynx normal.  PEERL,EOMI.  Nasal cavity with some mucus.   Ears and Nose: No external deformity. CV: RRR, No M/G/R. No JVD. No thrill. No extra heart sounds. PULM: CTA B, no wheezes, crackles, rhonchi. No retractions. No resp. distress. No accessory muscle use. ABD: S, NT, ND, +BS. No rebound. No HSM. EXTR: No c/c/e NEURO Normal gait.  PSYCH: Normally interactive. Conversant. Not depressed or anxious appearing.  Calm demeanor.    Assessment and Plan: Acute upper respiratory infections of unspecified site  Dysmenorrhea - Plan: traMADol (ULTRAM) 50 MG tablet  Suspect a viral URI, reassured that there is no current sign of AOM, and I do think her TM has healed.  She will watch her symptoms and report any change or worsening  Refilled her ultram. She feels that she has way to safeguard her medication so her husband will not be able to access it. She has used it in combination with her other medications (triptan, SSRI) in the past and feels comfortable with risk of possible drug interaction.  See patient instructions for more details.     Signed Abbe Amsterdam, MD

## 2012-09-12 ENCOUNTER — Other Ambulatory Visit: Payer: Self-pay | Admitting: Physician Assistant

## 2012-09-12 ENCOUNTER — Other Ambulatory Visit: Payer: Self-pay | Admitting: Family Medicine

## 2012-09-12 DIAGNOSIS — N946 Dysmenorrhea, unspecified: Secondary | ICD-10-CM

## 2012-09-12 NOTE — Telephone Encounter (Signed)
Please give her a call- has she used up the #30 and refill that I gave her the end of March?  If so, how often is she taking the tramadol?   Does she still want to pick up a paper rx?  There had been concern about her husband getting ahold of her medication in the past and she wanted a paper rx  Thanks!

## 2012-09-13 ENCOUNTER — Ambulatory Visit (INDEPENDENT_AMBULATORY_CARE_PROVIDER_SITE_OTHER): Payer: BC Managed Care – PPO | Admitting: Family Medicine

## 2012-09-13 ENCOUNTER — Other Ambulatory Visit: Payer: Self-pay | Admitting: Physician Assistant

## 2012-09-13 VITALS — BP 142/92 | HR 99 | Temp 98.5°F | Resp 16 | Ht 61.0 in | Wt 285.0 lb

## 2012-09-13 DIAGNOSIS — J069 Acute upper respiratory infection, unspecified: Secondary | ICD-10-CM

## 2012-09-13 MED ORDER — AMOXICILLIN-POT CLAVULANATE 875-125 MG PO TABS: 1.0000 | ORAL_TABLET | Freq: Two times a day (BID) | ORAL | Status: DC

## 2012-09-13 NOTE — Telephone Encounter (Signed)
Called pharmacy and confirmed that she used the original Rx and the RF that I had given her already.  Called and could not reach her but LMOM- I will refill her tramadol, but she needs to slow down her use of this medication.  It is a narcotic and can be habit forming.  This refill will need to last longer.

## 2012-09-13 NOTE — Telephone Encounter (Signed)
Sent in trazodone [facility]

## 2012-09-13 NOTE — Patient Instructions (Addendum)
Saline nasal spray atleast 4 times per day, over the counter mucinex or mucinex DM can help with cough., drink plenty of fluids. Your symptoms appear to be due to a virus at this point.  See the information below. If not improving in next 3-4 days - can start Augmentin, but does not appear to be a sinus infection at this point. Return to the clinic or go to the nearest emergency room if any of your symptoms worsen or new symptoms occur. Upper Respiratory Infection, Adult An upper respiratory infection (URI) is also sometimes known as the common cold. The upper respiratory tract includes the nose, sinuses, throat, trachea, and bronchi. Bronchi are the airways leading to the lungs. Most people improve within 1 week, but symptoms can last up to 2 weeks. A residual cough may last even longer.  CAUSES Many different viruses can infect the tissues lining the upper respiratory tract. The tissues become irritated and inflamed and often become very moist. Mucus production is also common. A cold is contagious. You can easily spread the virus to others by oral contact. This includes kissing, sharing a glass, coughing, or sneezing. Touching your mouth or nose and then touching a surface, which is then touched by another person, can also spread the virus. SYMPTOMS  Symptoms typically develop 1 to 3 days after you come in contact with a cold virus. Symptoms vary from person to person. They may include:  Runny nose.  Sneezing.  Nasal congestion.  Sinus irritation.  Sore throat.  Loss of voice (laryngitis).  Cough.  Fatigue.  Muscle aches.  Loss of appetite.  Headache.  Low-grade fever. DIAGNOSIS  You might diagnose your own cold based on familiar symptoms, since most people get a cold 2 to 3 times a year. Your caregiver can confirm this based on your exam. Most importantly, your caregiver can check that your symptoms are not due to another disease such as strep throat, sinusitis, pneumonia, asthma,  or epiglottitis. Blood tests, throat tests, and X-rays are not necessary to diagnose a common cold, but they may sometimes be helpful in excluding other more serious diseases. Your caregiver will decide if any further tests are required. RISKS AND COMPLICATIONS  You may be at risk for a more severe case of the common cold if you smoke cigarettes, have chronic heart disease (such as heart failure) or lung disease (such as asthma), or if you have a weakened immune system. The very young and very old are also at risk for more serious infections. Bacterial sinusitis, middle ear infections, and bacterial pneumonia can complicate the common cold. The common cold can worsen asthma and chronic obstructive pulmonary disease (COPD). Sometimes, these complications can require emergency medical care and may be life-threatening. PREVENTION  The best way to protect against getting a cold is to practice good hygiene. Avoid oral or hand contact with people with cold symptoms. Wash your hands often if contact occurs. There is no clear evidence that vitamin C, vitamin E, echinacea, or exercise reduces the chance of developing a cold. However, it is always recommended to get plenty of rest and practice good nutrition. TREATMENT  Treatment is directed at relieving symptoms. There is no cure. Antibiotics are not effective, because the infection is caused by a virus, not by bacteria. Treatment may include:  Increased fluid intake. Sports drinks offer valuable electrolytes, sugars, and fluids.  Breathing heated mist or steam (vaporizer or shower).  Eating chicken soup or other clear broths, and maintaining good nutrition.  Getting plenty of rest.  Using gargles or lozenges for comfort.  Controlling fevers with ibuprofen or acetaminophen as directed by your caregiver.  Increasing usage of your inhaler if you have asthma. Zinc gel and zinc lozenges, taken in the first 24 hours of the common cold, can shorten the  duration and lessen the severity of symptoms. Pain medicines may help with fever, muscle aches, and throat pain. A variety of non-prescription medicines are available to treat congestion and runny nose. Your caregiver can make recommendations and may suggest nasal or lung inhalers for other symptoms.  HOME CARE INSTRUCTIONS   Only take over-the-counter or prescription medicines for pain, discomfort, or fever as directed by your caregiver.  Use a warm mist humidifier or inhale steam from a shower to increase air moisture. This may keep secretions moist and make it easier to breathe.  Drink enough water and fluids to keep your urine clear or pale yellow.  Rest as needed.  Return to work when your temperature has returned to normal or as your caregiver advises. You may need to stay home longer to avoid infecting others. You can also use a face mask and careful hand washing to prevent spread of the virus. SEEK MEDICAL CARE IF:   After the first few days, you feel you are getting worse rather than better.  You need your caregiver's advice about medicines to control symptoms.  You develop chills, worsening shortness of breath, or brown or red sputum. These may be signs of pneumonia.  You develop yellow or brown nasal discharge or pain in the face, especially when you bend forward. These may be signs of sinusitis.  You develop a fever, swollen neck glands, pain with swallowing, or white areas in the back of your throat. These may be signs of strep throat. SEEK IMMEDIATE MEDICAL CARE IF:   You have a fever.  You develop severe or persistent headache, ear pain, sinus pain, or chest pain.  You develop wheezing, a prolonged cough, cough up blood, or have a change in your usual mucus (if you have chronic lung disease).  You develop sore muscles or a stiff neck. Document Released: 10/05/2000 Document Revised: 07/04/2011 Document Reviewed: 08/13/2010 [facility] Patient Information 2014 [place],  [place].

## 2012-09-13 NOTE — Progress Notes (Signed)
Subjective:    Patient ID: Ann Santos, female    DOB: Sep 19, 1980, 32 y.o.   MRN: [id number]  HPI Ann Santos is a 32 y.o. female PCP: [facility], no specific provider.   Started 2 days ago with cough, fatigue yesterday. Felt some short of breath at times. No known fever. r ear sore, neck sore, sinuses congested. Gagging/ coughed mucus this am. Just feels run down. Headache.  frustated that has had similar sx's in past and has to have frequent office visits.   32 yo dtr - had cough prior. Told was cold virus.   Tx; mucinex - last night. Zyrtec. And flonase at night.   Review of Systems  Constitutional: Negative for fever and chills.  HENT: Positive for ear pain, congestion and rhinorrhea. Negative for ear discharge.   Respiratory: Positive for cough and shortness of breath (feels congested. ).   Gastrointestinal: Negative for abdominal pain.  Genitourinary: Negative for dysuria and difficulty urinating.  Skin: Negative for rash.       Objective:   Physical Exam  Vitals reviewed. Constitutional: She is oriented to person, place, and time. She appears well-developed and well-nourished. No distress.  Overweight.   HENT:  Head: Atraumatic. Macrocephalic.  Right Ear: Hearing, tympanic membrane, external ear and ear canal normal. No swelling. Tympanic membrane is not injected, not erythematous, not retracted and not bulging. No middle ear effusion.  Left Ear: Hearing, tympanic membrane, external ear and ear canal normal. No swelling. Tympanic membrane is not injected, not erythematous, not retracted and not bulging.  No middle ear effusion.  Nose: Nose normal. Right sinus exhibits no maxillary sinus tenderness and no frontal sinus tenderness. Left sinus exhibits no maxillary sinus tenderness and no frontal sinus tenderness.  Mouth/Throat: Oropharynx is clear and moist. No oropharyngeal exudate.  Eyes: Conjunctivae and EOM are normal. Pupils are equal, round, and reactive to light.  Neck:  Normal range of motion. Neck supple.  Cardiovascular: Normal rate, regular rhythm, normal heart sounds and intact distal pulses.   No murmur heard. Pulmonary/Chest: Effort normal and breath sounds normal. No stridor. No respiratory distress. She has no wheezes. She has no rhonchi.  Clear, normal effort, speaks in full sentences. No distress.   Abdominal: Soft. There is no tenderness.  Lymphadenopathy:    She has no cervical adenopathy.  Neurological: She is alert and oriented to person, place, and time.  Skin: Skin is warm and dry. No rash noted.  Psychiatric: She has a normal mood and affect. Her behavior is normal.      Assessment & Plan:  [name] is a 32 y.o. female Acute upper respiratory infections of unspecified site - Plan: amoxicillin-clavulanate (AUGMENTIN) 875-125 MG per tablet discussed likely viral infection given 2-3 days fo sx's and sick contact with URI.  Sx care discussed., and if more sinus pressure/pain and discolored nasal discharge, can fill antibiotic.  Discussed role of antibiotics and possible side effects, reactions, and reasons for not starting at present. If does start augmentin - ok to call in Diflucan 150mg  x 1 as hx of candidal vaginitis. All questions answered and understanding expressed.   Meds ordered this encounter  Medications  . amoxicillin-clavulanate (AUGMENTIN) 875-125 MG per tablet    Sig: Take 1 tablet by mouth 2 (two) times daily.    Dispense:  20 tablet    Refill:  0   Patient Instructions  Saline nasal spray atleast 4 times per day, over the counter mucinex or mucinex DM  can help with cough., drink plenty of fluids. Your symptoms appear to be due to a virus at this point.  See the information below. If not improving in next 3-4 days - can start Augmentin, but does not appear to be a sinus infection at this point. Return to the clinic or go to the nearest emergency room if any of your symptoms worsen or new symptoms occur. Upper Respiratory  Infection, Adult An upper respiratory infection (URI) is also sometimes known as the common cold. The upper respiratory tract includes the nose, sinuses, throat, trachea, and bronchi. Bronchi are the airways leading to the lungs. Most people improve within 1 week, but symptoms can last up to 2 weeks. A residual cough may last even longer.  CAUSES Many different viruses can infect the tissues lining the upper respiratory tract. The tissues become irritated and inflamed and often become very moist. Mucus production is also common. A cold is contagious. You can easily spread the virus to others by oral contact. This includes kissing, sharing a glass, coughing, or sneezing. Touching your mouth or nose and then touching a surface, which is then touched by another person, can also spread the virus. SYMPTOMS  Symptoms typically develop 1 to 3 days after you come in contact with a cold virus. Symptoms vary from person to person. They may include:  Runny nose.  Sneezing.  Nasal congestion.  Sinus irritation.  Sore throat.  Loss of voice (laryngitis).  Cough.  Fatigue.  Muscle aches.  Loss of appetite.  Headache.  Low-grade fever. DIAGNOSIS  You might diagnose your own cold based on familiar symptoms, since most people get a cold 2 to 3 times a year. Your caregiver can confirm this based on your exam. Most importantly, your caregiver can check that your symptoms are not due to another disease such as strep throat, sinusitis, pneumonia, asthma, or epiglottitis. Blood tests, throat tests, and X-rays are not necessary to diagnose a common cold, but they may sometimes be helpful in excluding other more serious diseases. Your caregiver will decide if any further tests are required. RISKS AND COMPLICATIONS  You may be at risk for a more severe case of the common cold if you smoke cigarettes, have chronic heart disease (such as heart failure) or lung disease (such as asthma), or if you have a  weakened immune system. The very young and very old are also at risk for more serious infections. Bacterial sinusitis, middle ear infections, and bacterial pneumonia can complicate the common cold. The common cold can worsen asthma and chronic obstructive pulmonary disease (COPD). Sometimes, these complications can require emergency medical care and may be life-threatening. PREVENTION  The best way to protect against getting a cold is to practice good hygiene. Avoid oral or hand contact with people with cold symptoms. Wash your hands often if contact occurs. There is no clear evidence that vitamin C, vitamin E, echinacea, or exercise reduces the chance of developing a cold. However, it is always recommended to get plenty of rest and practice good nutrition. TREATMENT  Treatment is directed at relieving symptoms. There is no cure. Antibiotics are not effective, because the infection is caused by a virus, not by bacteria. Treatment may include:  Increased fluid intake. Sports drinks offer valuable electrolytes, sugars, and fluids.  Breathing heated mist or steam (vaporizer or shower).  Eating chicken soup or other clear broths, and maintaining good nutrition.  Getting plenty of rest.  Using gargles or lozenges for comfort.  Controlling fevers  with ibuprofen or acetaminophen as directed by your caregiver.  Increasing usage of your inhaler if you have asthma. Zinc gel and zinc lozenges, taken in the first 24 hours of the common cold, can shorten the duration and lessen the severity of symptoms. Pain medicines may help with fever, muscle aches, and throat pain. A variety of non-prescription medicines are available to treat congestion and runny nose. Your caregiver can make recommendations and may suggest nasal or lung inhalers for other symptoms.  HOME CARE INSTRUCTIONS   Only take over-the-counter or prescription medicines for pain, discomfort, or fever as directed by your caregiver.  Use a warm  mist humidifier or inhale steam from a shower to increase air moisture. This may keep secretions moist and make it easier to breathe.  Drink enough water and fluids to keep your urine clear or pale yellow.  Rest as needed.  Return to work when your temperature has returned to normal or as your caregiver advises. You may need to stay home longer to avoid infecting others. You can also use a face mask and careful hand washing to prevent spread of the virus. SEEK MEDICAL CARE IF:   After the first few days, you feel you are getting worse rather than better.  You need your caregiver's advice about medicines to control symptoms.  You develop chills, worsening shortness of breath, or brown or red sputum. These may be signs of pneumonia.  You develop yellow or brown nasal discharge or pain in the face, especially when you bend forward. These may be signs of sinusitis.  You develop a fever, swollen neck glands, pain with swallowing, or white areas in the back of your throat. These may be signs of strep throat. SEEK IMMEDIATE MEDICAL CARE IF:   You have a fever.  You develop severe or persistent headache, ear pain, sinus pain, or chest pain.  You develop wheezing, a prolonged cough, cough up blood, or have a change in your usual mucus (if you have chronic lung disease).  You develop sore muscles or a stiff neck. Document Released: 10/05/2000 Document Revised: 07/04/2011 Document Reviewed: 08/13/2010 [facility] Patient Information 2014 [place], [place].

## 2012-09-30 ENCOUNTER — Other Ambulatory Visit: Payer: Self-pay | Admitting: Physician Assistant

## 2012-10-17 ENCOUNTER — Other Ambulatory Visit: Payer: Self-pay | Admitting: Family Medicine

## 2012-10-18 NOTE — Telephone Encounter (Signed)
Called to talk to her- she states she did NOT request this refill.  Suspects this request came from her husband. She does not want this medication filled now

## 2012-10-23 ENCOUNTER — Ambulatory Visit (INDEPENDENT_AMBULATORY_CARE_PROVIDER_SITE_OTHER): Payer: BC Managed Care – PPO | Admitting: Family Medicine

## 2012-10-23 ENCOUNTER — Encounter: Payer: Self-pay | Admitting: Family Medicine

## 2012-10-23 VITALS — BP 138/94 | HR 91 | Temp 99.1°F | Resp 18 | Ht 62.25 in | Wt 287.2 lb

## 2012-10-23 DIAGNOSIS — K5790 Diverticulosis of intestine, part unspecified, without perforation or abscess without bleeding: Secondary | ICD-10-CM | POA: Insufficient documentation

## 2012-10-23 DIAGNOSIS — Z3169 Encounter for other general counseling and advice on procreation: Secondary | ICD-10-CM

## 2012-10-23 DIAGNOSIS — N946 Dysmenorrhea, unspecified: Secondary | ICD-10-CM

## 2012-10-23 DIAGNOSIS — F411 Generalized anxiety disorder: Secondary | ICD-10-CM

## 2012-10-23 MED ORDER — SERTRALINE HCL 100 MG PO TABS: 100.0000 mg | ORAL_TABLET | Freq: Every evening | ORAL | Status: DC

## 2012-10-23 MED ORDER — RIZATRIPTAN BENZOATE 10 MG PO TBDP: 10.0000 mg | ORAL_TABLET | ORAL | Status: DC | PRN

## 2012-10-23 MED ORDER — TRAZODONE HCL 100 MG PO TABS: ORAL_TABLET | ORAL | Status: DC

## 2012-10-23 MED ORDER — FLUCONAZOLE 150 MG PO TABS: ORAL_TABLET | ORAL | Status: DC

## 2012-10-23 MED ORDER — TRAMADOL HCL 50 MG PO TABS: ORAL_TABLET | ORAL | Status: DC

## 2012-10-24 NOTE — Progress Notes (Signed)
S:  This 32 y.o. Cauc female has chronic anxiety, successfully treated w/ Sertraline. When pt has attempted weaning medication, her symptoms worsen. She has a significant family hx of mental health disorders; she plans to take this medication for the foreseeable future. Trazodone markedly improves sleep hygiene; w/out this med, her mind races and it is hard to fall asleep.  Pt has dysmenorrhea diagnosed years ago; she has not had PAP/pelvic in > 12 months. She has delaying having this exam. As we discussed an appointment for CPE/PAP, pt mentioned that she wanted to have another child but was concerned about continuing mental health medications during pregnancy. I suggested she go for pre-conception counseling and have PAP/pelvic exam performed by GYN.  Pt also has recurrent yeast infection, usually associated w/ oral antibiotic. She took antibiotic in May w/ subsequent thick itchy discharge, not responding to OTC products. She requests refill for Diflucan.  Her husband has substance abuse hx and treatment/rehab earlier this year. They recently received marital counseling. She takes Tramadol for pelvic pain and dysmenorrhea but requests prescription be printed out to avoid confusion at the pharmacy w/ refills; this also ensures husband has no access to medication.  Patient Active Problem List   Diagnosis Date Noted  . Diverticular disease 10/23/2012  . URI (upper respiratory infection) 01/10/2012  . Umbilical hernia 10/26/2011  . Anxiety 07/26/2011   PMHx, Soc Hx and Fam Hx reviewed.  ROS: As per HPI.  O: Filed Vitals:   10/23/12 1524  BP: 138/94  Pulse: 91  Temp: 99.1 F (37.3 C)  Resp: 18   GEN: In NAD; WN,WD. Morbidly obese.  Pt has lost a,most 10 pounds. HENT: [place]/AT; EOMI w/ clear conj/sclerae. EACs/nose/oroph normal. COR: RRR. LUNGS: Normal resp rate and effort. SKIN: W&D. No rashes or pallor. MS: MAES; no cyanosis or slubbing. NEURO: A&O x 3; CNs intact. Motor/snsory intact.  Nonfocal. PSYCH: Anxious but otherwise affect is normal. Speech pattern and thought content normal. Cognition normal.  A/P: Generalized anxiety disorder - Stable on current medication; continue Sertraline w/ refill x 1 year.   Plan: sertraline (ZOLOFT) 100 MG tablet  Dysmenorrhea - Plan: traMADol (ULTRAM) 50 MG tablet, Ambulatory referral to Obstetrics / Gynecology  Pre-conception counseling - Pt needs GYN exam as well as discussion w/ GYN re: safety of medications during pregnancy.    Plan: Ambulatory referral to Obstetrics / Gynecology   Meds ordered this encounter  Medications  . fluconazole (DIFLUCAN) 150 MG tablet    Sig: TAKE 1 TABLET (150 MG TOTAL) BY MOUTH ONCE.    Dispense:  1 tablet    Refill:  1  . rizatriptan (MAXALT-MLT) 10 MG disintegrating tablet    Sig: Take 1 tablet (10 mg total) by mouth as needed for migraine. May repeat in 2 hours if needed    Dispense:  10 tablet    Refill:  3            . sertraline (ZOLOFT) 100 MG tablet    Sig: Take 1 tablet (100 mg total) by mouth every evening.    Dispense:  90 tablet    Refill:  3            . traZODone (DESYREL) 100 MG tablet    Sig: TAKE 1 TABLET BY MOUTH AT BEDTIME    Dispense:  90 tablet    Refill:  3  . traMADol (ULTRAM) 50 MG tablet    Sig: Take 1 tablet every 8 hours as needed for pain.  Dispense:  30 tablet    Refill:  1

## 2012-12-06 ENCOUNTER — Other Ambulatory Visit: Payer: Self-pay

## 2013-01-11 ENCOUNTER — Ambulatory Visit (INDEPENDENT_AMBULATORY_CARE_PROVIDER_SITE_OTHER): Payer: BC Managed Care – PPO | Admitting: Family Medicine

## 2013-01-11 VITALS — BP 132/78 | HR 92 | Temp 98.7°F | Resp 20 | Ht 62.0 in | Wt 294.0 lb

## 2013-01-11 DIAGNOSIS — J209 Acute bronchitis, unspecified: Secondary | ICD-10-CM

## 2013-01-11 DIAGNOSIS — J019 Acute sinusitis, unspecified: Secondary | ICD-10-CM

## 2013-01-11 DIAGNOSIS — J029 Acute pharyngitis, unspecified: Secondary | ICD-10-CM

## 2013-01-11 DIAGNOSIS — R059 Cough, unspecified: Secondary | ICD-10-CM

## 2013-01-11 DIAGNOSIS — R05 Cough: Secondary | ICD-10-CM

## 2013-01-11 LAB — POCT RAPID STREP A (OFFICE): Rapid Strep A Screen: NEGATIVE

## 2013-01-11 MED ORDER — FLUCONAZOLE 150 MG PO TABS: ORAL_TABLET | ORAL | Status: DC

## 2013-01-11 MED ORDER — AZITHROMYCIN 250 MG PO TABS: ORAL_TABLET | ORAL | Status: DC

## 2013-01-11 MED ORDER — HYDROCODONE-HOMATROPINE 5-1.5 MG/5ML PO SYRP: 5.0000 mL | ORAL_SOLUTION | Freq: Every evening | ORAL | Status: DC | PRN

## 2013-01-11 MED ORDER — PROMETHAZINE-DM 6.25-15 MG/5ML PO SYRP: 5.0000 mL | ORAL_SOLUTION | Freq: Two times a day (BID) | ORAL | Status: DC | PRN

## 2013-01-11 NOTE — Patient Instructions (Signed)
Acute Bronchitis You have acute bronchitis. This means you have a chest cold. The airways in your lungs are red and sore (inflamed). Acute means it is sudden onset.  CAUSES Bronchitis is most often caused by the same virus that causes a cold. SYMPTOMS   Body aches.  Chest congestion.  Chills.  Cough.  Fever.  Shortness of breath.  Sore throat. TREATMENT  Acute bronchitis is usually treated with rest, fluids, and medicines for relief of fever or cough. Most symptoms should go away after a few days or a week. Increased fluids may help thin your secretions and will prevent dehydration. Your caregiver may give you an inhaler to improve your symptoms. The inhaler reduces shortness of breath and helps control cough. You can take over-the-counter pain relievers or cough medicine to decrease coughing, pain, or fever. A cool-air vaporizer may help thin bronchial secretions and make it easier to clear your chest. Antibiotics are usually not needed but can be prescribed if you smoke, are seriously ill, have chronic lung problems, are elderly, or you are at higher risk for developing complications.Allergies and asthma can make bronchitis worse. Repeated episodes of bronchitis may cause longstanding lung problems. Avoid smoking and secondhand smoke.Exposure to cigarette smoke or irritating chemicals will make bronchitis worse. If you are a cigarette smoker, consider using nicotine gum or skin patches to help control withdrawal symptoms. Quitting smoking will help your lungs heal faster. Recovery from bronchitis is often slow, but you should start feeling better after 2 to 3 days. Cough from bronchitis frequently lasts for 3 to 4 weeks. To prevent another bout of acute bronchitis:  Quit smoking.  Wash your hands frequently to get rid of viruses or use a hand sanitizer.  Avoid other people with cold or virus symptoms.  Try not to touch your hands to your mouth, nose, or eyes. SEEK IMMEDIATE  MEDICAL CARE IF:  You develop increased fever, chills, or chest pain.  You have severe shortness of breath or bloody sputum.  You develop dehydration, fainting, repeated vomiting, or a severe headache.  You have no improvement after 1 week of treatment or you get worse. MAKE SURE YOU:   Understand these instructions.  Will watch your condition.  Will get help right away if you are not doing well or get worse. Document Released: 05/19/2004 Document Revised: 07/04/2011 Document Reviewed: 08/04/2010 [employer] Patient Information 2014 [employer].  

## 2013-01-11 NOTE — Progress Notes (Signed)
[facility]:  Office Visit  Chief Complaint:  Chief Complaint  Patient presents with  . Cough    with congestion, pt c/o SOB, symptoms are getting worse  . Sore Throat  . Headache  . Neck Pain    HPI: Ann Santos is a G2L1 at 41 weeks  32 y.o. female who complains of 10 day history of sinus and nasal congestion, productive green cough, sore throat without fevers, chill. Has had some nausea. 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 She has tried otc cough meds which made it worse and benadryl. + PND and ear pain. + SOB, wheeze.   Past Medical History  Diagnosis Date  . Diverticulitis     Per pt. States she never had CT scan to confirm this  . Anxiety   . Diverticulosis     PT [facility]IZED AT [facility] October 03, 2011  . Umbilical hernia     planning surgical repair  . Constipation   . Abdominal distention   . Abdominal pain   . Blood in stool   . Rectal bleeding   . Hypertension     During pregnancy only   Past Surgical History  Procedure Laterality Date  . Colonoscopy  10/07/2011    Procedure: COLONOSCOPY;  Surgeon: Theda Belfast, MD;  Location: [facility] ENDOSCOPY;  Service: Endoscopy;  Laterality: N/A;  . Esophagogastroduodenoscopy  10/07/2011    Procedure: ESOPHAGOGASTRODUODENOSCOPY (EGD);  Surgeon: Theda Belfast, MD;  Location: [facility];  Service: Endoscopy;  Laterality: N/A;  . Cesarean section  05/28/09  . Scar revision  09/17/09    c-section scar revision  . Umbilical hernia repair  11/01/2011    Procedure: HERNIA REPAIR UMBILICAL ADULT;  Surgeon: Velora Heckler, MD;  Location: [facility] ORS;  Service: General;  Laterality: N/A;   History   Social History  . Marital Status: Married    Spouse Name: N/A    Number of Children: N/A  . Years of Education: N/A   Social History Main Topics  . Smoking status: Never Smoker   . Smokeless tobacco: Never Used  . Alcohol Use: No     Comment: twice a month  . Drug Use:  No  . Sexual Activity: Yes   Other Topics Concern  . None   Social History Narrative  . None   Family History  Problem Relation Age of Onset  . Diabetes Mother   . Hypertension Mother   . Ulcerative colitis Father   . Hypertension Father   . Cancer Maternal Grandmother     lung   No Known Allergies Prior to Admission medications   Medication Sig Start Date End Date Taking? Authorizing Provider  acidophilus (RISAQUAD) CAPS Take 1 capsule by mouth daily.   Yes Historical Provider, MD  Doxylamine-Pyridoxine (DICLEGIS PO) Take by mouth at bedtime.   Yes Historical Provider, MD  fluticasone (FLONASE) 50 MCG/ACT nasal spray Place 2 sprays into the nose daily. 04/29/12 04/29/13 Yes Anders Simmonds, PA-C  Prenatal Vit-Fe Fumarate-FA (MULTIVITAMIN-PRENATAL) 27-0.8 MG TABS tablet Take 1 tablet by mouth daily at 12 noon.   Yes Historical Provider, MD  sertraline (ZOLOFT) 100 MG tablet Take 1 tablet (100 mg total) by mouth every evening. 10/23/12  Yes Maurice March, MD  traZODone (DESYREL) 100 MG tablet TAKE 1 TABLET BY MOUTH AT BEDTIME 10/23/12  Yes Maurice March, MD  fluconazole (DIFLUCAN) 150 MG tablet TAKE 1  TABLET (150 MG TOTAL) BY MOUTH ONCE. 10/23/12   Maurice March, MD  HYDROcodone-acetaminophen (NORCO/VICODIN) 5-325 MG per tablet Take 1 tablet by mouth every 4 (four) hours as needed for pain. 06/18/12   Dorthula Matas, PA-C  rizatriptan (MAXALT-MLT) 10 MG disintegrating tablet Take 1 tablet (10 mg total) by mouth as needed for migraine. May repeat in 2 hours if needed 10/23/12   Maurice March, MD  traMADol (ULTRAM) 50 MG tablet Take 1 tablet every 8 hours as needed for pain. 10/23/12   Maurice March, MD     ROS: The patient denies fevers, chills, night sweats, unintentional weight loss, chest pain, palpitations, wheezing, dyspnea on exertion, nausea, vomiting, abdominal pain, dysuria, hematuria, melena, numbness, weakness, or tingling.   All other systems have been  reviewed and were otherwise negative with the exception of those mentioned in the HPI and as above.    PHYSICAL EXAM: Filed Vitals:   01/11/13 0748  BP: 132/78  Pulse: 92  Temp: 98.7 F (37.1 C)  Resp: 20   Filed Vitals:   01/11/13 0748  Height: 5\' 2"  (1.575 m)  Weight: 294 lb (133.358 kg)   Body mass index is 53.76 kg/(m^2).  General: Alert, no acute distress HEENT:  Normocephalic, atraumatic, oropharynx patent. EOMI, PERRLA, + PND, TM nl, + erythematous tonsils, no exudates. + sinus tenderness Cardiovascular:  Regular rate and rhythm, no rubs murmurs or gallops.  No Carotid bruits, radial pulse intact. No pedal edema.  Respiratory: Clear to auscultation bilaterally.  No wheezes, rales, or rhonchi.  No cyanosis, no use of accessory musculature GI: No organomegaly, abdomen is soft and non-tender, positive bowel sounds.  No masses. Skin: No rashes. Neurologic: Facial musculature symmetric. Psychiatric: Patient is appropriate throughout our interaction. Lymphatic: No cervical lymphadenopathy Musculoskeletal: Gait intact.   LABS: Results for orders placed in visit on 01/11/13  POCT RAPID STREP A (OFFICE)      Result Value Range   Rapid Strep A Screen Negative  Negative     EKG/XRAY:   Primary read interpreted by Dr. Conley Rolls at [facility].   ASSESSMENT/PLAN: Encounter Diagnoses  Name Primary?  . Acute pharyngitis Yes  . Cough   . Acute sinusitis   . Acute bronchitis    Rx Detro-promethazine Rx Z pack Work note given for Friday to Monday off F/u prn Gross sideeffects, risk and benefits, and alternatives of medications d/w patient. Patient is aware that all medications have potential sideeffects and we are unable to predict every sideeffect or drug-drug interaction that may occur.  Abbagale Goguen PHUONG, DO 01/11/2013 8:50 AM

## 2013-01-15 ENCOUNTER — Ambulatory Visit (INDEPENDENT_AMBULATORY_CARE_PROVIDER_SITE_OTHER): Payer: BC Managed Care – PPO | Admitting: Family Medicine

## 2013-01-15 VITALS — BP 128/72 | HR 97 | Temp 98.0°F | Resp 18 | Ht 62.0 in | Wt 294.0 lb

## 2013-01-15 DIAGNOSIS — J019 Acute sinusitis, unspecified: Secondary | ICD-10-CM

## 2013-01-15 DIAGNOSIS — R0982 Postnasal drip: Secondary | ICD-10-CM

## 2013-01-15 MED ORDER — IPRATROPIUM BROMIDE 0.06 % NA SOLN: 2.0000 | Freq: Three times a day (TID) | NASAL | Status: DC

## 2013-01-15 MED ORDER — ALBUTEROL SULFATE HFA 108 (90 BASE) MCG/ACT IN AERS: 2.0000 | INHALATION_SPRAY | Freq: Four times a day (QID) | RESPIRATORY_TRACT | Status: DC | PRN

## 2013-01-15 NOTE — Progress Notes (Signed)
[facility]:  Office Visit  Chief Complaint:  Chief Complaint  Patient presents with  . Follow-up    no better     HPI: Ann Santos is a 32 y.o. female who complains of her for recheck and not improving after being on z pack. She is pregnat, see prior HPI.   Ann Santos is a G2L1 at 60 weeks 32 y.o. female who complains of 10 day history of sinus and nasal congestion, productive green cough, sore throat without fevers, chill. Has had some nausea. 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 She has tried otc cough meds which made it worse and benadryl. + PND and ear pain. + SOB, wheeze.   Past Medical History  Diagnosis Date  . Diverticulitis     Per pt. States she never had CT scan to confirm this  . Anxiety   . Diverticulosis     PT HOSPITALIZED AT [facility] October 03, 2011  . Umbilical hernia     planning surgical repair  . Constipation   . Abdominal distention   . Abdominal pain   . Blood in stool   . Rectal bleeding   . Hypertension     During pregnancy only   Past Surgical History  Procedure Laterality Date  . Colonoscopy  10/07/2011    Procedure: COLONOSCOPY;  Surgeon: Theda Belfast, MD;  Location: [facility];  Service: Endoscopy;  Laterality: N/A;  . Esophagogastroduodenoscopy  10/07/2011    Procedure: ESOPHAGOGASTRODUODENOSCOPY (EGD);  Surgeon: Theda Belfast, MD;  Location: [facility];  Service: Endoscopy;  Laterality: N/A;  . Cesarean section  05/28/09  . Scar revision  09/17/09    c-section scar revision  . Umbilical hernia repair  11/01/2011    Procedure: HERNIA REPAIR UMBILICAL ADULT;  Surgeon: Velora Heckler, MD;  Location: [facility];  Service: General;  Laterality: N/A;   History   Social History  . Marital Status: Married    Spouse Name: N/A    Number of Children: N/A  . Years of Education: N/A   Social History Main Topics  . Smoking status: Never Smoker   . Smokeless tobacco: Never Used   . Alcohol Use: No     Comment: twice a month  . Drug Use: No  . Sexual Activity: Yes   Other Topics Concern  . None   Social History Narrative  . None   Family History  Problem Relation Age of Onset  . Diabetes Mother   . Hypertension Mother   . Ulcerative colitis Father   . Hypertension Father   . Cancer Maternal Grandmother     lung   No Known Allergies Prior to Admission medications   Medication Sig Start Date End Date Taking? Authorizing Provider  acidophilus (RISAQUAD) CAPS Take 1 capsule by mouth daily.   Yes Historical Provider, MD  Doxylamine-Pyridoxine (DICLEGIS PO) Take by mouth at bedtime.   Yes Historical Provider, MD  fluticasone (FLONASE) 50 MCG/ACT nasal spray Place 2 sprays into the nose daily. 04/29/12 04/29/13 Yes Anders Simmonds, PA-C  Prenatal Vit-Fe Fumarate-FA (MULTIVITAMIN-PRENATAL) 27-0.8 MG TABS tablet Take 1 tablet by mouth daily at 12 noon.   Yes Historical Provider, MD  sertraline (ZOLOFT) 100 MG tablet Take 1 tablet (100 mg total) by mouth every evening. 10/23/12  Yes Maurice March, MD  traZODone (DESYREL) 100 MG tablet TAKE 1 TABLET BY MOUTH AT BEDTIME 10/23/12  Yes  Maurice March, MD  azithromycin (ZITHROMAX) 250 MG tablet Take 2 tabs po now then 1 tab po daily 01/11/13   Jefferson Fullam P Kole Hilyard, DO  fluconazole (DIFLUCAN) 150 MG tablet TAKE 1 TABLET (150 MG TOTAL) BY MOUTH ONCE. 01/11/13   Fines Kimberlin P Finnean Cerami, DO  promethazine-dextromethorphan (PROMETHAZINE-DM) 6.25-15 MG/5ML syrup Take 5 mLs by mouth 2 (two) times daily as needed for cough. 01/11/13   Ezekeil Bethel P Jerimey Burridge, DO     ROS: The patient denies fevers, chills, night sweats, unintentional weight loss, chest pain, palpitations, wheezing, dyspnea on exertion, nausea, vomiting, abdominal pain, dysuria, hematuria, melena, numbness, weakness, or tingling.   All other systems have been reviewed and were otherwise negative with the exception of those mentioned in the HPI and as above.    PHYSICAL EXAM: Filed Vitals:    01/15/13 1023  BP: 128/72  Pulse: 97  Temp: 98 F (36.7 C)  Resp: 18   Filed Vitals:   01/15/13 1023  Height: 5\' 2"  (1.575 m)  Weight: 294 lb (133.358 kg)   Body mass index is 53.76 kg/(m^2).  General: Alert, no acute distress HEENT:  Normocephalic, atraumatic, oropharynx patent. EOMI, PERRLA. + sinus tenderness, TM nl, no exudates Cardiovascular:  Regular rate and rhythm, no rubs murmurs or gallops.  No Carotid bruits, radial pulse intact. No pedal edema.  Respiratory: Clear to auscultation bilaterally.  No wheezes, rales, or rhonchi.  No cyanosis, no use of accessory musculature GI: No organomegaly, abdomen is soft and non-tender, positive bowel sounds.  No masses. Skin: No rashes. Neurologic: Facial musculature symmetric. Psychiatric: Patient is appropriate throughout our interaction. Lymphatic: No cervical lymphadenopathy Musculoskeletal: Gait intact.   LABS: Results for orders placed in visit on 01/11/13  POCT RAPID STREP A (OFFICE)      Result Value Range   Rapid Strep A Screen Negative  Negative     EKG/XRAY:   Primary read interpreted by Dr. Conley Rolls at [facility].   ASSESSMENT/PLAN: Encounter Diagnoses  Name Primary?  . Acute sinusitis Yes  . Post-nasal drip    Rx atrovent NS and also albuterol INH prn ( class c for pregnancy, she is aware) No e/o DVT F/u prn If no improvement with sxs treatment in 48 hrs then will consider rx amox Work note given  Gross sideeffects, risk and benefits, and alternatives of medications d/w patient. Patient is aware that all medications have potential sideeffects and we are unable to predict every sideeffect or drug-drug interaction that may occur.  Davionne Mastrangelo PHUONG, DO 01/15/2013 11:44 AM

## 2013-01-20 ENCOUNTER — Other Ambulatory Visit: Payer: Self-pay | Admitting: Physician Assistant

## 2013-02-15 ENCOUNTER — Other Ambulatory Visit: Payer: Self-pay | Admitting: Physician Assistant

## 2013-02-25 ENCOUNTER — Ambulatory Visit (INDEPENDENT_AMBULATORY_CARE_PROVIDER_SITE_OTHER): Payer: BC Managed Care – PPO | Admitting: Family Medicine

## 2013-02-25 VITALS — BP 128/78 | HR 106 | Temp 99.8°F | Resp 16 | Ht 62.0 in | Wt 297.0 lb

## 2013-02-25 DIAGNOSIS — H6690 Otitis media, unspecified, unspecified ear: Secondary | ICD-10-CM

## 2013-02-25 DIAGNOSIS — H6691 Otitis media, unspecified, right ear: Secondary | ICD-10-CM

## 2013-02-25 DIAGNOSIS — Z23 Encounter for immunization: Secondary | ICD-10-CM

## 2013-02-25 MED ORDER — AMOXICILLIN 875 MG PO TABS: 875.0000 mg | ORAL_TABLET | Freq: Two times a day (BID) | ORAL | Status: DC

## 2013-02-25 NOTE — Progress Notes (Signed)
Subjective:    Patient ID: Ann Santos, female    DOB: 1980-08-31, 32 y.o.   MRN: [id number]  Sore Throat  Associated symptoms include coughing and ear pain. Pertinent negatives include no shortness of breath.  Otalgia  Associated symptoms include coughing and a sore throat.   This chart was scribed for Kenyon Ana Romilda Proby-MD, by Ladona Ridgel Day, Scribe. This patient was seen in room 13 and the patient's care was started at 8:55 AM.  HPI Comments: Ann Santos is a 32 y.o. female who is 5 months pregnant, has a 67 1/2 year old daughter who has also recently been sick.  Today she presents to the [facility] who is 5 months pregnant complaining of constant, gradually worsened sore throat, ear pain and mild cough, onset 1 week ago. She denies any fever/chills.   Past Medical History  Diagnosis Date  . Diverticulitis     Per pt. States she never had CT scan to confirm this  . Anxiety   . Diverticulosis     PT HOSPITALIZED AT [facility] October 03, 2011  . Umbilical hernia     planning surgical repair  . Constipation   . Abdominal distention   . Abdominal pain   . Blood in stool   . Rectal bleeding   . Hypertension     During pregnancy only    Past Surgical History  Procedure Laterality Date  . Colonoscopy  10/07/2011    Procedure: COLONOSCOPY;  Surgeon: Theda Belfast, MD;  Location: [facility];  Service: Endoscopy;  Laterality: N/A;  . Esophagogastroduodenoscopy  10/07/2011    Procedure: ESOPHAGOGASTRODUODENOSCOPY (EGD);  Surgeon: Theda Belfast, MD;  Location: [facility];  Service: Endoscopy;  Laterality: N/A;  . Cesarean section  05/28/09  . Scar revision  09/17/09    c-section scar revision  . Umbilical hernia repair  11/01/2011    Procedure: HERNIA REPAIR UMBILICAL ADULT;  Surgeon: Velora Heckler, MD;  Location: [facility];  Service: General;  Laterality: N/A;    Family History  Problem Relation Age of Onset  . Diabetes Mother   . Hypertension Mother   . Ulcerative  colitis Father   . Hypertension Father   . Cancer Maternal Grandmother     lung    History   Social History  . Marital Status: Married    Spouse Name: N/A    Number of Children: N/A  . Years of Education: N/A   Occupational History  . Not on file.   Social History Main Topics  . Smoking status: Never Smoker   . Smokeless tobacco: Never Used  . Alcohol Use: No     Comment: twice a month  . Drug Use: No  . Sexual Activity: Yes   Other Topics Concern  . Not on file   Social History Narrative  . No narrative on file    No Known Allergies  Patient Active Problem List   Diagnosis Date Noted  . Diverticular disease 10/23/2012  . URI (upper respiratory infection) 01/10/2012  . Umbilical hernia 10/26/2011  . Anxiety 07/26/2011    Results for orders placed in visit on 01/11/13  POCT RAPID STREP A (OFFICE)      Result Value Range   Rapid Strep A Screen Negative  Negative    1. Need for prophylactic vaccination and inoculation against influenza   2. Otitis media, right   3. Otitis media, right Acute    Meds ordered this encounter  Medications  .  DISCONTD: amoxicillin (AMOXIL) 875 MG tablet    Sig: Take 1 tablet (875 mg total) by mouth 2 (two) times daily.    Dispense:  20 tablet    Refill:  0  . amoxicillin (AMOXIL) 875 MG tablet    Sig: Take 1 tablet (875 mg total) by mouth 2 (two) times daily.    Dispense:  20 tablet    Refill:  0    Triage Vitals: BP 128/78  Pulse 106  Temp(Src) 99.8 F (37.7 C) (Oral)  Resp 16  Ht 5\' 2"  (1.575 m)  Wt 297 lb (134.718 kg)  BMI 54.31 kg/m2  SpO2 98%  LMP 10/20/2012  Review of Systems  Constitutional: Negative for fever and chills.  HENT: Positive for ear pain and sore throat.   Respiratory: Positive for cough. Negative for shortness of breath.   Cardiovascular: Negative for chest pain.       Objective:   Physical Exam  Nursing note and vitals reviewed. Constitutional: She is oriented to person, place, and  time. She appears well-developed and well-nourished. No distress.  HENT:  Head: Normocephalic and atraumatic.  Right TM retracted.   Neck: Neck supple. No tracheal deviation present.  Cardiovascular: Normal rate.   Pulmonary/Chest: Effort normal. No respiratory distress.  Musculoskeletal: Normal range of motion.  Neurological: She is alert and oriented to person, place, and time.  Skin: Skin is warm and dry.  Psychiatric: She has a normal mood and affect. Her behavior is normal.       Assessment & Plan:  Need for prophylactic vaccination and inoculation against influenza - Plan: Flu Vaccine QUAD 36+ mos IM  Otitis media, right - Plan: amoxicillin (AMOXIL) 875 MG tablet, DISCONTINUED: amoxicillin (AMOXIL) 875 MG tablet  Otitis media, right, Acute - Plan: amoxicillin (AMOXIL) 875 MG tablet, DISCONTINUED: amoxicillin (AMOXIL) 875 MG tablet  Signed, Elvina Sidle, MD

## 2013-02-27 ENCOUNTER — Other Ambulatory Visit (HOSPITAL_COMMUNITY): Payer: Self-pay | Admitting: Obstetrics and Gynecology

## 2013-02-27 DIAGNOSIS — O99210 Obesity complicating pregnancy, unspecified trimester: Secondary | ICD-10-CM

## 2013-03-25 ENCOUNTER — Ambulatory Visit (INDEPENDENT_AMBULATORY_CARE_PROVIDER_SITE_OTHER): Payer: BC Managed Care – PPO | Admitting: Family Medicine

## 2013-03-25 VITALS — BP 120/74 | HR 85 | Temp 98.5°F | Resp 18 | Ht 62.0 in | Wt 297.0 lb

## 2013-03-25 DIAGNOSIS — H6691 Otitis media, unspecified, right ear: Secondary | ICD-10-CM

## 2013-03-25 DIAGNOSIS — J209 Acute bronchitis, unspecified: Secondary | ICD-10-CM

## 2013-03-25 DIAGNOSIS — J019 Acute sinusitis, unspecified: Secondary | ICD-10-CM

## 2013-03-25 DIAGNOSIS — H6690 Otitis media, unspecified, unspecified ear: Secondary | ICD-10-CM

## 2013-03-25 MED ORDER — FLUCONAZOLE 150 MG PO TABS: 150.0000 mg | ORAL_TABLET | ORAL | Status: DC

## 2013-03-25 MED ORDER — AMOXICILLIN 875 MG PO TABS: 875.0000 mg | ORAL_TABLET | Freq: Two times a day (BID) | ORAL | Status: DC

## 2013-03-25 NOTE — Progress Notes (Signed)
Patient ID: Ann Santos MRN: [id number], DOB: [birth year], 32 y.o. Date of Encounter: 03/25/2013, 8:02 AM  Primary Physician: Tally Due, MD  Chief Complaint:  Chief Complaint  Patient presents with  . Otalgia    x 3 days     HPI: 32 y.o. year old female presents with 3 day history of otalgia. Symptoms began with nasal congestion, sore throat, and cough. Afebrile. Nasal congestion thick and green/yellow. Cough is  Sounds productive. Ear painful and  feels full, leading to sensation of muffled hearing. No drainage or discharge from affected ear. Has tried OTC cold preps without success. No GI complaints.   No sick contacts, recent antibiotics, or recent travels.   Here with   Past Medical History  Diagnosis Date  . Diverticulitis     Per pt. States she never had CT scan to confirm this  . Anxiety   . Diverticulosis     PT HOSPITALIZED AT [facility] October 03, 2011  . Umbilical hernia     planning surgical repair  . Constipation   . Abdominal distention   . Abdominal pain   . Blood in stool   . Rectal bleeding   . Hypertension     During pregnancy only     Home Meds: Prior to Admission medications   Medication Sig Start Date End Date Taking? Authorizing Provider  acidophilus (RISAQUAD) CAPS Take 1 capsule by mouth daily.   Yes Historical Provider, MD  albuterol (PROVENTIL HFA;VENTOLIN HFA) 108 (90 BASE) MCG/ACT inhaler Inhale 2 puffs into the lungs every 6 (six) hours as needed for wheezing. 01/15/13  Yes Thao P Le, DO  busPIRone (BUSPAR) 5 MG tablet Take 5 mg by mouth 3 (three) times daily.   Yes Historical Provider, MD  Doxylamine-Pyridoxine (DICLEGIS PO) Take by mouth at bedtime.   Yes Historical Provider, MD  fluticasone (FLONASE) 50 MCG/ACT nasal spray Place 2 sprays into the nose daily. 04/29/12 04/29/13 Yes Anders Simmonds, PA-C  Prenatal Vit-Fe Fumarate-FA (MULTIVITAMIN-PRENATAL) 27-0.8 MG TABS tablet Take 1 tablet by mouth daily at 12 noon.   Yes Historical  Provider, MD  sertraline (ZOLOFT) 100 MG tablet Take 50 mg by mouth every evening. 10/23/12  Yes Maurice March, MD  amoxicillin (AMOXIL) 875 MG tablet Take 1 tablet (875 mg total) by mouth 2 (two) times daily. 02/25/13   Elvina Sidle, MD  fluconazole (DIFLUCAN) 150 MG tablet TAKE 1 TABLET (150 MG TOTAL) BY MOUTH ONCE. 01/11/13   Thao P Le, DO  ipratropium (ATROVENT) 0.06 % nasal spray Place 2 sprays into the nose 3 (three) times daily. 01/15/13   Thao P Le, DO  promethazine-dextromethorphan (PROMETHAZINE-DM) 6.25-15 MG/5ML syrup Take 5 mLs by mouth 2 (two) times daily as needed for cough. 01/11/13   Thao P Le, DO  traZODone (DESYREL) 100 MG tablet TAKE 1 TABLET BY MOUTH AT BEDTIME 02/15/13   Maurice March, MD    Allergies: No Known Allergies  History   Social History  . Marital Status: Married    Spouse Name: N/A    Number of Children: N/A  . Years of Education: N/A   Occupational History  . Not on file.   Social History Main Topics  . Smoking status: Never Smoker   . Smokeless tobacco: Never Used  . Alcohol Use: No     Comment: twice a month  . Drug Use: No  . Sexual Activity: Yes   Other Topics Concern  . Not on file  Social History Narrative  . No narrative on file     Review of Systems: Constitutional: negative for chills, fever, night sweats or weight changes HEENT: see above Respiratory: negative for hemoptysis, wheezing, or shortness of breath Abdominal: negative for abdominal pain, nausea, vomiting or diarrhea Dermatological: negative for rash Neurologic: negative for headache   Physical Exam: Blood pressure 120/74, pulse 85, temperature 98.5 F (36.9 C), temperature source Oral, resp. rate 18, height 5\' 2"  (1.575 m), weight 297 lb (134.718 kg), last menstrual period 10/20/2012, SpO2 99.00%., Body mass index is 54.31 kg/(m^2). General: Well developed, well nourished, in no acute distress. Head: Normocephalic, atraumatic, eyes without discharge,  sclera non-icteric, nares are congested. Bilateral auditory canals clear. Right TM erythematous, dull, and bulging with purulent effusion behind. No perforation visualized. Contralateral TM pearly grey with reflective cone of light, no effusion or perforation visualized. Oral cavity moist, dentition normal. Posterior pharynx with post nasal drip and mild erythema. No peritonsillar abscess or tonsillar exudate. Neck: Supple. No thyromegaly. Full ROM. No lymphadenopathy. Lungs: Clear bilaterally to auscultation without wheezes, rales, or rhonchi. Breathing is unlabored.  Heart: RRR with S1 S2. No murmurs, rubs, or gallops appreciated. Abdomen: Soft, non-tender, non-distended with normoactive bowel sounds. No hepatosplenomegaly. No rebound/guarding. No obvious abdominal masses. McBurney's, Rovsing's, Iliopsoas, and table jar all negative. Msk:  Strength and tone normal for age. Extremities: No clubbing or cyanosis. No edema. Neuro: Alert and oriented X 3. Moves all extremities spontaneously. CNII-XII grossly in tact. Psych:  Responds to questions appropriately with a normal affect.     ASSESSMENT AND PLAN:  32 y.o. year old female with acute otitis media of right ear without perforation. -Otitis media, right - Plan: amoxicillin (AMOXIL) 875 MG tablet  Acute sinusitis - Plan: fluconazole (DIFLUCAN) 150 MG tablet  Acute bronchitis - Plan: fluconazole (DIFLUCAN) 150 MG tablet   -Tylenol prn -Rest/fluids -RTC precautions -RTC 3 days if no improvement  Alonna Buckler, md 03/25/2013 8:02 AM

## 2013-03-27 ENCOUNTER — Ambulatory Visit (HOSPITAL_COMMUNITY)
Admission: RE | Admit: 2013-03-27 | Discharge: 2013-03-27 | Disposition: A | Discharge status: Home / Self Care | Payer: BC Managed Care – PPO | Source: Ambulatory Visit | Attending: Obstetrics and Gynecology | Admitting: Obstetrics and Gynecology

## 2013-03-27 ENCOUNTER — Encounter (HOSPITAL_COMMUNITY): Payer: Self-pay

## 2013-03-27 DIAGNOSIS — O34219 Maternal care for unspecified type scar from previous cesarean delivery: Secondary | ICD-10-CM | POA: Insufficient documentation

## 2013-03-27 DIAGNOSIS — O99210 Obesity complicating pregnancy, unspecified trimester: Secondary | ICD-10-CM

## 2013-03-27 DIAGNOSIS — O09299 Supervision of pregnancy with other poor reproductive or obstetric history, unspecified trimester: Secondary | ICD-10-CM | POA: Insufficient documentation

## 2013-03-27 DIAGNOSIS — E669 Obesity, unspecified: Secondary | ICD-10-CM | POA: Insufficient documentation

## 2013-03-27 DIAGNOSIS — Z1389 Encounter for screening for other disorder: Secondary | ICD-10-CM | POA: Insufficient documentation

## 2013-03-27 DIAGNOSIS — O358XX0 Maternal care for other (suspected) fetal abnormality and damage, not applicable or unspecified: Secondary | ICD-10-CM | POA: Insufficient documentation

## 2013-03-27 DIAGNOSIS — Z363 Encounter for antenatal screening for malformations: Secondary | ICD-10-CM | POA: Insufficient documentation

## 2013-04-01 ENCOUNTER — Telehealth: Payer: Self-pay

## 2013-04-01 ENCOUNTER — Emergency Department (HOSPITAL_COMMUNITY)
Admission: EM | Admit: 2013-04-01 | Discharge: 2013-04-01 | Disposition: A | Discharge status: Home / Self Care | Payer: BC Managed Care – PPO | Attending: Emergency Medicine | Admitting: Emergency Medicine

## 2013-04-01 ENCOUNTER — Encounter (HOSPITAL_COMMUNITY): Payer: Self-pay | Admitting: Emergency Medicine

## 2013-04-01 ENCOUNTER — Ambulatory Visit (INDEPENDENT_AMBULATORY_CARE_PROVIDER_SITE_OTHER): Payer: BC Managed Care – PPO | Admitting: Family Medicine

## 2013-04-01 VITALS — BP 130/80 | HR 106 | Temp 98.6°F | Resp 17 | Ht 63.0 in | Wt 293.0 lb

## 2013-04-01 DIAGNOSIS — R112 Nausea with vomiting, unspecified: Secondary | ICD-10-CM

## 2013-04-01 DIAGNOSIS — H6690 Otitis media, unspecified, unspecified ear: Secondary | ICD-10-CM

## 2013-04-01 DIAGNOSIS — K529 Noninfective gastroenteritis and colitis, unspecified: Secondary | ICD-10-CM

## 2013-04-01 DIAGNOSIS — O99340 Other mental disorders complicating pregnancy, unspecified trimester: Secondary | ICD-10-CM | POA: Insufficient documentation

## 2013-04-01 DIAGNOSIS — F411 Generalized anxiety disorder: Secondary | ICD-10-CM | POA: Insufficient documentation

## 2013-04-01 DIAGNOSIS — E669 Obesity, unspecified: Secondary | ICD-10-CM | POA: Insufficient documentation

## 2013-04-01 DIAGNOSIS — O169 Unspecified maternal hypertension, unspecified trimester: Secondary | ICD-10-CM | POA: Insufficient documentation

## 2013-04-01 DIAGNOSIS — Z79899 Other long term (current) drug therapy: Secondary | ICD-10-CM | POA: Insufficient documentation

## 2013-04-01 DIAGNOSIS — Z3490 Encounter for supervision of normal pregnancy, unspecified, unspecified trimester: Secondary | ICD-10-CM

## 2013-04-01 DIAGNOSIS — H6691 Otitis media, unspecified, right ear: Secondary | ICD-10-CM

## 2013-04-01 DIAGNOSIS — O9989 Other specified diseases and conditions complicating pregnancy, childbirth and the puerperium: Secondary | ICD-10-CM | POA: Insufficient documentation

## 2013-04-01 DIAGNOSIS — K5289 Other specified noninfective gastroenteritis and colitis: Secondary | ICD-10-CM | POA: Insufficient documentation

## 2013-04-01 LAB — COMPREHENSIVE METABOLIC PANEL
ALT: 13 U/L (ref 0–35)
AST: 17 U/L (ref 0–37)
Albumin: 2.8 g/dL — ABNORMAL LOW (ref 3.5–5.2)
Alkaline Phosphatase: 57 U/L (ref 39–117)
BUN: 8 mg/dL (ref 6–23)
CO2: 22 mEq/L (ref 19–32)
Calcium: 9.4 mg/dL (ref 8.4–10.5)
Chloride: 100 mEq/L (ref 96–112)
Creatinine, Ser: 0.52 mg/dL (ref 0.50–1.10)
GFR calc Af Amer: >90 mL/min (ref >90)
GFR calc non Af Amer: >90 mL/min (ref >90)
Glucose, Bld: 134 mg/dL — ABNORMAL HIGH (ref 70–99)
Potassium: 3.7 mEq/L (ref 3.5–5.1)
Sodium: 137 mEq/L (ref 135–145)
Total Bilirubin: 0.5 mg/dL (ref 0.3–1.2)
Total Protein: 7 g/dL (ref 6.0–8.3)

## 2013-04-01 LAB — CBC WITH DIFFERENTIAL/PLATELET
Basophils Absolute: 0 10*3/uL (ref 0.0–0.1)
Basophils Relative: 0 % (ref 0–1)
Eosinophils Absolute: 0 10*3/uL (ref 0.0–0.7)
Eosinophils Relative: 0 % (ref 0–5)
HCT: 40.8 % (ref 36.0–46.0)
Hemoglobin: 14.2 g/dL (ref 12.0–15.0)
Lymphocytes Relative: 3 % — ABNORMAL LOW (ref 12–46)
Lymphs Abs: 0.4 10*3/uL — ABNORMAL LOW (ref 0.7–4.0)
MCH: 30.7 pg (ref 26.0–34.0)
MCHC: 34.8 g/dL (ref 30.0–36.0)
MCV: 88.1 fL (ref 78.0–100.0)
Monocytes Absolute: 0.3 10*3/uL (ref 0.1–1.0)
Monocytes Relative: 2 % — ABNORMAL LOW (ref 3–12)
Neutro Abs: 12.1 10*3/uL — ABNORMAL HIGH (ref 1.7–7.7)
Neutrophils Relative %: 94 % — ABNORMAL HIGH (ref 43–77)
Platelets: 316 10*3/uL (ref 150–400)
RBC: 4.63 MIL/uL (ref 3.87–5.11)
RDW: 13 % (ref 11.5–15.5)
WBC: 12.8 10*3/uL — ABNORMAL HIGH (ref 4.0–10.5)

## 2013-04-01 LAB — URINALYSIS, ROUTINE W REFLEX MICROSCOPIC
Glucose, UA: NEGATIVE mg/dL
Hgb urine dipstick: NEGATIVE
Ketones, ur: >80 mg/dL — AB
Nitrite: NEGATIVE
Protein, ur: 100 mg/dL — AB
Specific Gravity, Urine: 1.029 (ref 1.005–1.030)
Urobilinogen, UA: 0.2 mg/dL (ref 0.0–1.0)
pH: 5.5 (ref 5.0–8.0)

## 2013-04-01 LAB — URINE MICROSCOPIC-ADD ON

## 2013-04-01 MED ORDER — ONDANSETRON HCL 4 MG PO TABS: 4.0000 mg | ORAL_TABLET | Freq: Four times a day (QID) | ORAL | Status: DC

## 2013-04-01 MED ORDER — ONDANSETRON HCL 4 MG/2ML IJ SOLN
4.0000 mg | Freq: Once | INTRAMUSCULAR | Status: AC
  Administered 2013-04-01: 4 mg via INTRAVENOUS
  Filled 2013-04-01: qty 2

## 2013-04-01 MED ORDER — ONDANSETRON 8 MG PO TBDP: 8.0000 mg | ORAL_TABLET | Freq: Three times a day (TID) | ORAL | Status: DC | PRN

## 2013-04-01 MED ORDER — SODIUM CHLORIDE 0.9 % IV BOLUS (SEPSIS)
1000.0000 mL | Freq: Once | INTRAVENOUS | Status: AC
  Administered 2013-04-01: 1000 mL via INTRAVENOUS

## 2013-04-01 MED ORDER — AZITHROMYCIN 250 MG PO TABS: ORAL_TABLET | ORAL | Status: DC

## 2013-04-01 NOTE — Telephone Encounter (Signed)
Patient advised Dr Milus Glazier has sent in meds for Nausea

## 2013-04-01 NOTE — Telephone Encounter (Signed)
Pt states nausea meds not helping, she is pregnant, states she has a fever now and feels dehydrated, pt wants advise as to what to do

## 2013-04-01 NOTE — Progress Notes (Signed)
° °  Subjective:  This chart was scribed for Elvina Sidle, MD  by Caryn Bee, Medical Scribe. This patient was seen in Room/bed 11 and the patient's care was started at 8:05 AM.    Patient ID: Ann Santos, female    DOB: Aug 20, 1980, 33 y.o.   MRN: [id number]  HPI HPI Comments: Ann Santos is a 32 y.o. female who presents to [facility] complaining of right ear pain that began 03/25/13. Pt reports associated fatigue and headache. She is having trouble sleeping due to not feeling well. She reports being able to hear fine. Pt states this is her 5th ear infection in the past 6 months. She usually contracts them from her daughter, however her daughter did not give her the current infection. Pt was seen here one week ago for the same symptoms and was diagnosed with an ear infection. Pt is 6 months pregnant.    Review of Systems     Objective:   Physical Exam no acute distress HEENT: Unremarkable except for retracted right eardrum with amber fluid behind it and diagonal scar from the umbo. Neck: Supple no adenopathy      Assessment & Plan:  Serous otitis media, persistent Otitis media, right - Plan: Ambulatory referral to ENT, azithromycin (ZITHROMAX Z-PAK) 250 MG tablet  Signed, Elvina Sidle, MD

## 2013-04-01 NOTE — Telephone Encounter (Signed)
Pt seen Dr L this am,since leaving here she is constantly throwing up,is requesting A nausea medicine called in.  Best phone for pt is [phone]   Pharmacy [facility] [address]

## 2013-04-01 NOTE — Patient Instructions (Signed)
Otitis Media with Effusion °Otitis media with effusion is the presence of fluid in the middle ear. This is a common problem that often follows ear infections. It may be present for weeks or longer after the infection. Unlike an acute ear infection, otits media with effusion refers only to fluid behind the ear drum and not infection. Children with repeated ear and sinus infections and allergy problems are the most likely to get otitis media with effusion. °CAUSES  °The most frequent cause of the fluid buildup is dysfunction of the eustacian tubes. These are the tubes that drain fluid in the ears to the throat. °SYMPTOMS  °· The main symptom of this condition is hearing loss. As a result, you or your child may: °· Listen to the TV at a loud volume. °· Not respond to questions. °· Ask "what" often when spoken to. °· There may be a sensation of fullness or pressure but usually not pain. °DIAGNOSIS  °· Your caregiver will diagnose this condition by examining you or your child's ears. °· Your caregiver may test the pressure in you or your child's ear with a tympanometer. °· A hearing test may be conducted if the problem persists. °· A caregiver will want to re-evaluate the condition periodically to see if it improves. °TREATMENT  °· Treatment depends on the duration and the effects of the effusion. °· Antibiotics, decongestants, nose drops, and cortisone-type drugs may not be helpful. °· Children with persistent ear effusions may have delayed language. Children at risk for developmental delays in hearing, learning, and speech may require referral to a specialist earlier than children not at risk. °· You or your child's caregiver may suggest a referral to an Ear, Nose, and Throat (ENT) surgeon for treatment. The following may help restore normal hearing: °· Drainage of fluid. °· Placement of ear tubes (tympanostomy tubes). °· Removal of adenoids (adenoidectomy). °HOME CARE INSTRUCTIONS  °· Avoid second hand  smoke. °· Infants who are breast fed are less likely to have this condition. °· Avoid feeding infants while laying flat. °· Avoid known environmental allergens. °· Be sure to see a caregiver or an ENT specialist for follow up. °· Avoid people who are sick. °SEEK MEDICAL CARE IF:  °· Hearing is not better in 3 months. °· Hearing is worse. °· Ear pain. °· Drainage from the ear. °· Dizziness. °Document Released: 05/19/2004 Document Revised: 07/04/2011 Document Reviewed: 11/06/2012 °[employer]® Patient Information ©2014 [employer]. ° °

## 2013-04-01 NOTE — ED Notes (Signed)
Pt was given ice chips and ginger ale---- pt taking ice chips and sips of ginger ale, tolerating well.

## 2013-04-01 NOTE — ED Provider Notes (Signed)
CSN: [id number]     Arrival date & time 04/01/13  1738 History   First MD Initiated Contact with Patient 04/01/13 1814     Chief Complaint  Patient presents with  . Diarrhea  . Emesis   (Consider location/radiation/quality/duration/timing/severity/associated sxs/prior Treatment) The history is provided by the patient. No language interpreter was used.  Ann Santos is a 32 y/o 6 month pregnant F with PMHx of diverticulitis, anxiety, constipation, HTN presenting to the ED with nausea, vomiting, and diarrhea that started today. Patient reported that she has had at least 9 episodes of emesis - NB/NB - and 6-7 episodes of diarrhea - NB/NM. Patient reported that she is unable to keep anything down. Patient reported that she was feeling fever and took her temperature, reported that her temperature was approximately 101 degrees Fahrenheit. Patient reported that she took a Tylenol. Patient reported that she has been having myaglias today. Reported that she was seen by her PCP today regarding a chronic middle ear infection to the right ear - reported that she has been taking Amoxicillin for the past couple of days, stated that the physician started her on a Z-pack today - reported that she did not start taking it. Patient reported that she contacted her physician who recommended her to come to the ED. Patient reported that she is currently taking her prenatals, stated that she had an appointment where the baby was checked last Wednesday and the baby is doing well - patient denied any complications thus far. Denied abdominal pain, cramping, vaginal bleeding, vaginal discharge, chills, chest pain, shortness of breath, difficulty breathing.  PCP Dr. Perrin Maltese [id number]  Past Medical History  Diagnosis Date  . Diverticulitis     Per pt. States she never had CT scan to confirm this  . Anxiety   . Diverticulosis     PT HOSPITALIZED AT [facility] October 03, 2011  . Umbilical hernia     planning surgical repair  .  Constipation   . Abdominal distention   . Abdominal pain   . Blood in stool   . Rectal bleeding   . Hypertension     During pregnancy only  . Anxiety    Past Surgical History  Procedure Laterality Date  . Colonoscopy  10/07/2011    Procedure: COLONOSCOPY;  Surgeon: Theda Belfast, MD;  Location: [facility];  Service: Endoscopy;  Laterality: N/A;  . Esophagogastroduodenoscopy  10/07/2011    Procedure: ESOPHAGOGASTRODUODENOSCOPY (EGD);  Surgeon: Theda Belfast, MD;  Location: [facility];  Service: Endoscopy;  Laterality: N/A;  . Cesarean section  05/28/09  . Scar revision  09/17/09    c-section scar revision  . Umbilical hernia repair  11/01/2011    Procedure: HERNIA REPAIR UMBILICAL ADULT;  Surgeon: Velora Heckler, MD;  Location: [facility];  Service: General;  Laterality: N/A;   Family History  Problem Relation Age of Onset  . Diabetes Mother   . Hypertension Mother   . Ulcerative colitis Father   . Hypertension Father   . Cancer Maternal Grandmother     lung   History  Substance Use Topics  . Smoking status: Never Smoker   . Smokeless tobacco: Never Used  . Alcohol Use: No     Comment: twice a month   OB History   Grav Para Term Preterm Abortions TAB SAB Ect Mult Living   2 1 1       1      Review of Systems  Constitutional: Negative for fever and  chills.  HENT: Negative for trouble swallowing.   Respiratory: Negative for chest tightness and shortness of breath.   Cardiovascular: Negative for chest pain.  Gastrointestinal: Positive for nausea, vomiting and diarrhea. Negative for abdominal pain, blood in stool and anal bleeding.  Musculoskeletal: Negative for back pain and neck pain.  Neurological: Negative for dizziness and weakness.  All other systems reviewed and are negative.    Allergies  Review of patient's allergies indicates no known allergies.  Home Medications   Current Outpatient Rx  Name  Route  Sig  Dispense  Refill  . busPIRone (BUSPAR) 5 MG tablet    Oral   Take 5 mg by mouth 3 (three) times daily.         . diphenhydrAMINE (BENADRYL) 25 MG tablet   Oral   Take 25 mg by mouth every 6 (six) hours as needed (sleep).         . Doxylamine-Pyridoxine (DICLEGIS PO)   Oral   Take by mouth at bedtime.         . ondansetron (ZOFRAN ODT) 8 MG disintegrating tablet   Oral   Take 1 tablet (8 mg total) by mouth every 8 (eight) hours as needed for nausea or vomiting.   20 tablet   0   . Prenatal Vit-Fe Fumarate-FA (MULTIVITAMIN-PRENATAL) 27-0.8 MG TABS tablet   Oral   Take 1 tablet by mouth daily at 12 noon.         . Probiotic Product (PROBIOTIC COLON SUPPORT PO)   Oral   Take 1 tablet by mouth daily.         . sertraline (ZOLOFT) 100 MG tablet   Oral   Take 50 mg by mouth every evening.         Marland Kitchen albuterol (PROVENTIL HFA;VENTOLIN HFA) 108 (90 BASE) MCG/ACT inhaler   Inhalation   Inhale 2 puffs into the lungs every 6 (six) hours as needed for wheezing.   1 Inhaler   0   . azithromycin (ZITHROMAX Z-PAK) 250 MG tablet      Take as directed on pack   6 tablet   0   . ondansetron (ZOFRAN) 4 MG tablet   Oral   Take 1 tablet (4 mg total) by mouth every 6 (six) hours.   12 tablet   0    BP 118/76  Pulse 127  Temp(Src) 99.6 F (37.6 C) (Oral)  Resp 18  Ht 5\' 2"  (1.575 m)  Wt 206 lb (93.441 kg)  BMI 37.67 kg/m2  SpO2 97%  LMP 10/16/2012 Physical Exam  Nursing note and vitals reviewed. Constitutional: She is oriented to person, place, and time. She appears well-developed and well-nourished. No distress.  HENT:  Head: Normocephalic and atraumatic.  Right Ear: There is tenderness. No drainage or swelling. No foreign bodies. Tympanic membrane is erythematous. Tympanic membrane is not perforated and not bulging. A middle ear effusion is present.  Left Ear: Tympanic membrane, external ear and ear canal normal. No drainage, swelling or tenderness. Tympanic membrane is not injected, not scarred, not perforated, not  erythematous, not retracted and not bulging.  Mouth/Throat: Oropharynx is clear and moist. No oropharyngeal exudate.  Eyes: Conjunctivae and EOM are normal. Pupils are equal, round, and reactive to light. Right eye exhibits no discharge. Left eye exhibits no discharge.  Neck: Normal range of motion. Neck supple.  Cardiovascular: Normal rate and regular rhythm.  Exam reveals no friction rub.   No murmur heard. Pulses:  Radial pulses are 2+ on the right side, and 2+ on the left side.       Dorsalis pedis pulses are 2+ on the right side, and 2+ on the left side.  Pulmonary/Chest: Effort normal and breath sounds normal. No respiratory distress. She has no wheezes. She has no rales.  Abdominal: Soft. Bowel sounds are normal. There is no tenderness. There is no guarding.  Obese  Musculoskeletal: Normal range of motion. She exhibits no tenderness.  Lymphadenopathy:    She has no cervical adenopathy.  Neurological: She is alert and oriented to person, place, and time. She exhibits normal muscle tone. Coordination normal.  Skin: Skin is warm and dry. No rash noted. She is not diaphoretic. No erythema.  Psychiatric: She has a normal mood and affect. Her behavior is normal. Thought content normal.    ED Course  Procedures (including critical care time)  9:34 PM This provider discussed labs in detail with patient. Discussed plan for PO challenge. Patient reported that she is feeling "generally better." Patient reported that she is feeling better, patient currently drinking gingerale.   10:06 PM Nurse reported that patient tolerated PO fluids and crackers well. Reported that patient stated that she is feeling better and is ready to go home.   Labs Review Labs Reviewed  COMPREHENSIVE METABOLIC PANEL - Abnormal; Notable for the following:    Glucose, Bld 134 (*)    Albumin 2.8 (*)    All other components within normal limits  CBC WITH DIFFERENTIAL - Abnormal; Notable for the following:    WBC  12.8 (*)    Neutrophils Relative % 94 (*)    Neutro Abs 12.1 (*)    Lymphocytes Relative 3 (*)    Lymphs Abs 0.4 (*)    Monocytes Relative 2 (*)    All other components within normal limits  URINALYSIS, ROUTINE W REFLEX MICROSCOPIC - Abnormal; Notable for the following:    Color, Urine AMBER (*)    APPearance CLOUDY (*)    Bilirubin Urine SMALL (*)    Ketones, ur >80 (*)    Protein, ur 100 (*)    Leukocytes, UA SMALL (*)    All other components within normal limits  URINE MICROSCOPIC-ADD ON   Imaging Review No results found.  EKG Interpretation   None       MDM   1. Gastroenteritis   2. Pregnant    Medications  sodium chloride 0.9 % bolus 1,000 mL (0 mLs Intravenous Stopped 04/01/13 2023)  ondansetron (ZOFRAN) injection 4 mg (4 mg Intravenous Given 04/01/13 1902)  ondansetron (ZOFRAN) injection 4 mg (4 mg Intravenous Given 04/01/13 2107)   Filed Vitals:   04/01/13 1756 04/01/13 2103  BP: 150/102 118/76  Pulse: 140 127  Temp: 99.6 F (37.6 C)   TempSrc: Oral   Resp: 20 18  Height: 5\' 2"  (1.575 m)   Weight: 206 lb (93.441 kg)   SpO2: 97% 97%    Patient presenting to the ED with nausea, vomiting, diarrhea that started today. Patient currently 6 months pregnant.  Alert and oriented. GCS 15. Heart rate and rhythm normal. Lungs clear to auscultation bilaterally to upper and lower lobes. Full ROM to upper and lower extremities bilaterally. Patient is not diaphoretic. Obese. BS normoactive in all 4 quadrants. Negative pain upon palpation to the abdomen, soft. negative acute abdomen, negative peritoneal signs.  CBC mild elevated WBC of 12.8 with mld leukocytosis noted. CMP negative findings. Urine noted small leukocytes - negative nitrites noted  in urine.  Patient placed on IV fluids and IV zofran administered. Patient tolerated fluids and cracker PO - nausea improved. Patient has had no episodes of emesis while in ED setting.  Patient stable, afebrile. Patient tolerated PO  fluids. Doubt acute abdominal processes - abdomen non-tender and benign on exam. Suspicion high for viral gastroenteritis. Patient appears well. Discharged patient with zofran. Discussed with patient diet. Discussed with patient to rest and stay hydrated. Discussed with patient to follow-up with PCP and OBGYN. Work note given. Discussed with patient to continue to monitor symptoms and if symptoms are to worsen or change to report back to the ED - strict return instructions given.  Patient agreed to plan of care, understood, all questions answered.   Raymon Mutton, PA-C 04/02/13 1434

## 2013-04-01 NOTE — Telephone Encounter (Signed)
She is pregnant, was not vomiting when here, please advise.

## 2013-04-01 NOTE — ED Notes (Addendum)
Patient went to [facility] with ear pain. Patient states that she began having diarrhea x 5 today and vomited x 8. Patient called the physician back and she was prescribed Zofran. Patient states she has not been able to keep the medicine down. patient states the emesis is green. Patient is 6 months pregnant.

## 2013-04-02 NOTE — Telephone Encounter (Signed)
She should go to the [facility]. They can take care of her and check the baby. She did go last night. She feels better today.

## 2013-04-05 NOTE — ED Provider Notes (Signed)
Medical screening examination/treatment/procedure(s) were performed by non-physician practitioner and as supervising physician I was immediately available for consultation/collaboration.  EKG Interpretation   None          Neyland Pettengill J. Keaton Beichner, MD 04/05/13 1928 

## 2013-04-25 NOTE — L&D Delivery Note (Signed)
Asked to attend repeat C Section for this 33 y.o. female G2P1001 at 5137.3 wks with gestational HTN who was admitted for repeat C-section as [facility]  Patient BP is noted to be elevated since last week, she has h/o Preeclampsia. PIH labs 1 week back that were normal. She denies symptoms of HA/SOB/CP. She was seen by MFM for growth sono due to morbid obesity at regular intervals and was seen on 3/11 by Dr Melene MullerWhittaker who recommended delivery. Patient desired repeat C/section. Infant with normal exam, apgars 9 and 9 at one and five minutes. Bulb suction and stimulation with drying only. Left in care of central nursery personal to do skin to skin with the mother.   _________________________ Electronically signed by: Bonner PunaFairy A. Effie Shyoleman, NNP-BC

## 2013-04-25 NOTE — L&D Delivery Note (Signed)
Reviewed and agree with note and plan. V.Lennyn Gange, MD  

## 2013-05-01 ENCOUNTER — Other Ambulatory Visit (HOSPITAL_COMMUNITY): Payer: Self-pay | Admitting: Obstetrics & Gynecology

## 2013-05-01 DIAGNOSIS — O09299 Supervision of pregnancy with other poor reproductive or obstetric history, unspecified trimester: Secondary | ICD-10-CM

## 2013-05-01 DIAGNOSIS — O34219 Maternal care for unspecified type scar from previous cesarean delivery: Secondary | ICD-10-CM

## 2013-05-01 DIAGNOSIS — E669 Obesity, unspecified: Secondary | ICD-10-CM

## 2013-05-01 DIAGNOSIS — O99210 Obesity complicating pregnancy, unspecified trimester: Principal | ICD-10-CM

## 2013-05-09 ENCOUNTER — Inpatient Hospital Stay (HOSPITAL_COMMUNITY)
Admission: AD | Admit: 2013-05-09 | Discharge: 2013-05-09 | Disposition: A | Discharge status: Home / Self Care | Payer: BC Managed Care – PPO | Source: Ambulatory Visit | Attending: Obstetrics & Gynecology | Admitting: Obstetrics & Gynecology

## 2013-05-09 ENCOUNTER — Ambulatory Visit (HOSPITAL_COMMUNITY)
Admission: RE | Admit: 2013-05-09 | Discharge: 2013-05-09 | Disposition: A | Discharge status: Home / Self Care | Payer: BC Managed Care – PPO | Source: Ambulatory Visit | Attending: Obstetrics & Gynecology | Admitting: Obstetrics & Gynecology

## 2013-05-09 ENCOUNTER — Encounter (HOSPITAL_COMMUNITY): Payer: Self-pay | Admitting: *Deleted

## 2013-05-09 ENCOUNTER — Encounter (HOSPITAL_COMMUNITY): Payer: Self-pay

## 2013-05-09 VITALS — BP 137/93 | HR 117 | Wt 303.0 lb

## 2013-05-09 DIAGNOSIS — O169 Unspecified maternal hypertension, unspecified trimester: Secondary | ICD-10-CM

## 2013-05-09 DIAGNOSIS — O34219 Maternal care for unspecified type scar from previous cesarean delivery: Secondary | ICD-10-CM | POA: Insufficient documentation

## 2013-05-09 DIAGNOSIS — E669 Obesity, unspecified: Secondary | ICD-10-CM

## 2013-05-09 DIAGNOSIS — R51 Headache: Secondary | ICD-10-CM | POA: Insufficient documentation

## 2013-05-09 DIAGNOSIS — R030 Elevated blood-pressure reading, without diagnosis of hypertension: Secondary | ICD-10-CM | POA: Insufficient documentation

## 2013-05-09 DIAGNOSIS — O9989 Other specified diseases and conditions complicating pregnancy, childbirth and the puerperium: Principal | ICD-10-CM

## 2013-05-09 DIAGNOSIS — O99210 Obesity complicating pregnancy, unspecified trimester: Secondary | ICD-10-CM

## 2013-05-09 DIAGNOSIS — O99891 Other specified diseases and conditions complicating pregnancy: Secondary | ICD-10-CM | POA: Insufficient documentation

## 2013-05-09 DIAGNOSIS — O09299 Supervision of pregnancy with other poor reproductive or obstetric history, unspecified trimester: Secondary | ICD-10-CM | POA: Insufficient documentation

## 2013-05-09 LAB — URINALYSIS, ROUTINE W REFLEX MICROSCOPIC
Bilirubin Urine: NEGATIVE
Glucose, UA: NEGATIVE mg/dL
Ketones, ur: 15 mg/dL — AB
Nitrite: NEGATIVE
Protein, ur: NEGATIVE mg/dL
Specific Gravity, Urine: 1.025 (ref 1.005–1.030)
Urobilinogen, UA: 0.2 mg/dL (ref 0.0–1.0)
pH: 6 (ref 5.0–8.0)

## 2013-05-09 LAB — COMPREHENSIVE METABOLIC PANEL
ALT: 11 U/L (ref 0–35)
AST: 15 U/L (ref 0–37)
Albumin: 2.6 g/dL — ABNORMAL LOW (ref 3.5–5.2)
Alkaline Phosphatase: 68 U/L (ref 39–117)
BUN: 6 mg/dL (ref 6–23)
CO2: 23 mEq/L (ref 19–32)
Calcium: 9 mg/dL (ref 8.4–10.5)
Chloride: 100 mEq/L (ref 96–112)
Creatinine, Ser: 0.44 mg/dL — ABNORMAL LOW (ref 0.50–1.10)
GFR calc Af Amer: >90 mL/min (ref >90)
GFR calc non Af Amer: >90 mL/min (ref >90)
Glucose, Bld: 99 mg/dL (ref 70–99)
Potassium: 3.6 mEq/L — ABNORMAL LOW (ref 3.7–5.3)
Sodium: 135 mEq/L — ABNORMAL LOW (ref 137–147)
Total Bilirubin: <0.2 mg/dL — ABNORMAL LOW (ref 0.3–1.2)
Total Protein: 6.5 g/dL (ref 6.0–8.3)

## 2013-05-09 LAB — URINE MICROSCOPIC-ADD ON

## 2013-05-09 LAB — CBC WITH DIFFERENTIAL/PLATELET
Basophils Absolute: 0 10*3/uL (ref 0.0–0.1)
Basophils Relative: 0 % (ref 0–1)
Eosinophils Absolute: 0.1 10*3/uL (ref 0.0–0.7)
Eosinophils Relative: 0 % (ref 0–5)
HCT: 35.2 % — ABNORMAL LOW (ref 36.0–46.0)
Hemoglobin: 12 g/dL (ref 12.0–15.0)
Lymphocytes Relative: 17 % (ref 12–46)
Lymphs Abs: 2.2 10*3/uL (ref 0.7–4.0)
MCH: 29.5 pg (ref 26.0–34.0)
MCHC: 34.1 g/dL (ref 30.0–36.0)
MCV: 86.5 fL (ref 78.0–100.0)
Monocytes Absolute: 0.7 10*3/uL (ref 0.1–1.0)
Monocytes Relative: 6 % (ref 3–12)
Neutro Abs: 9.7 10*3/uL — ABNORMAL HIGH (ref 1.7–7.7)
Neutrophils Relative %: 77 % (ref 43–77)
Platelets: 274 10*3/uL (ref 150–400)
RBC: 4.07 MIL/uL (ref 3.87–5.11)
RDW: 13.3 % (ref 11.5–15.5)
WBC: 12.6 10*3/uL — ABNORMAL HIGH (ref 4.0–10.5)

## 2013-05-09 LAB — URIC ACID: Uric Acid, Serum: 3.8 mg/dL (ref 2.4–7.0)

## 2013-05-09 NOTE — Discharge Instructions (Signed)
Hypertension During Pregnancy Hypertension is also called high blood pressure. It can occur at any time in life and during pregnancy. When you have hypertension, there is extra pressure inside your blood vessels that carry blood from the heart to the rest of your body (arteries). Hypertension during pregnancy can cause problems for you and your baby. Your baby might not weigh as much as it should at birth or might be born early (premature). Very bad cases of hypertension during pregnancy can be life threatening.  Different types of hypertension can occur during pregnancy.   Chronic hypertension. This happens when a woman has hypertension before pregnancy and it continues during pregnancy.  Gestational hypertension. This is when hypertension develops during pregnancy.  Preeclampsia or toxemia of pregnancy. This is a very serious type of hypertension that develops only during pregnancy. It is a disease that affects the whole body (systemic) and can be very dangerous for both mother and baby.  Gestational hypertension and preeclampsia usually go away after your baby is born. Blood pressure generally stabilizes within 6 weeks. Women who have hypertension during pregnancy have a greater chance of developing hypertension later in life or with future pregnancies. RISK FACTORS Some factors make you more likely to develop hypertension during pregnancy. Risk factors include:  Having hypertension before pregnancy.  Having hypertension during a previous pregnancy.  Being overweight.  Being older than 57.  Being pregnant with more than one baby (multiples).  Having diabetes or kidney problems. SIGNS AND SYMPTOMS Chronic and gestational hypertension rarely cause symptoms. Preeclampsia has symptoms, which may include:  Increased protein in your urine. Your health care provider will check for this at every prenatal visit.  Swelling of your hands and face.  Rapid weight gain.  Headaches.  Visual  changes.  Being bothered by light.  Abdominal pain, especially in the right upper area.  Chest pain.  Shortness of breath.  Increased reflexes.  Seizures. Seizures occur with a more severe form of preeclampsia, called eclampsia. DIAGNOSIS   You may be diagnosed with hypertension during a regular prenatal exam. At each visit, tests may include:  Blood pressure checks.  A urine test to check for protein in your urine.  The type of hypertension you are diagnosed with depends on when you developed it. It also depends on your specific blood pressure reading.  Developing hypertension before 20 weeks of pregnancy is consistent with chronic hypertension.  Developing hypertension after 20 weeks of pregnancy is consistent with gestational hypertension.  Hypertension with increased urinary protein is diagnosed as preeclampsia.  Blood pressure measurements that stay above 696 systolic or 789 diastolic are a sign of severe preeclampsia. TREATMENT Treatment for hypertension during pregnancy varies. Treatment depends on the type of hypertension and how serious it is.  If you take medicine for chronic hypertension, you may need to switch medicines.  Drugs called ACE inhibitors should not be taken during pregnancy.  Low-dose aspirin may be suggested for women who have risk factors for preeclampsia.  If you have gestational hypertension, you may need to take a blood pressure medicine that is safe during pregnancy. Your health care provider will recommend the appropriate medicine.  If you have severe preeclampsia, you may need to be in the hospital. Health care providers will watch you and the baby very closely. You also may need to take medicine (magnesium sulfate) to prevent seizures and lower blood pressure.  Sometimes an early delivery is needed. This may be the case if the condition worsens. It would  be done to protect you and the baby. The only cure for preeclampsia is delivery. HOME  CARE INSTRUCTIONS  Schedule and keep all of your regular appointments for prenatal care.  Only take over-the-counter or prescription medicines as directed by your health care provider. Tell your health care provider about all medicines you take.  Eat as little salt as possible.  Get regular exercise.  Do not drink alcohol.  Do not use tobacco products.  Do not drink products with caffeine.  Lie on your left side when resting. SEEK IMMEDIATE MEDICAL CARE IF:  You have severe abdominal pain.  You have sudden swelling in the hands, ankles, or face.  You gain 4 pounds (1.8 kg) or more in 1 week.  You vomit repeatedly.  You have vaginal bleeding.  You do not feel the baby moving as much.  You have a headache.  You have blurred or double vision.  You have muscle twitching or spasms.  You have shortness of breath.  You have blue fingernails and lips.  You have blood in your urine. MAKE SURE YOU:  Understand these instructions.  Will watch your condition.  Will get help right away if you are not doing well or get worse. Document Released: 12/28/2010 Document Revised: 01/30/2013 Document Reviewed: 11/08/2012 [facility] Patient Information 2014 [place].  Preeclampsia and Eclampsia Preeclampsia is a condition of high blood pressure during pregnancy. It can happen at 20 weeks or later in pregnancy. If high blood pressure occurs in the second half of pregnancy with no other symptoms, it is called gestational hypertension and goes away after the baby is born. If any of the symptoms listed below develop with gestational hypertension, it is then called preeclampsia. Eclampsia (convulsions) may follow preeclampsia. This is one of the reasons for regular prenatal checkups. Early diagnosis and treatment are very important to prevent eclampsia. CAUSES  There is no known cause of preeclampsia/eclampsia in pregnancy. There are several known conditions that may put the pregnant  woman at risk, such as:  The first pregnancy.  Having preeclampsia in a past pregnancy.  Having lasting (chronic) high blood pressure.  Having multiples (twins, triplets).  Being age 33 or older.  African American ethnic background.  Having kidney disease or diabetes.  Medical conditions such as lupus or blood diseases.  Being overweight (obese). SYMPTOMS   High blood pressure.  Headaches.  Sudden weight gain.  Swelling of hands, face, legs, and feet.  Protein in the urine.  Feeling sick to your stomach (nauseous) and throwing up (vomiting).  Vision problems (blurred or double vision).  Numbness in the face, arms, legs, and feet.  Dizziness.  Slurred speech.  Preeclampsia can cause growth retardation in the fetus.  Separation (abruption) of the placenta.  Not enough fluid in the amniotic sac (oligohydramnios).  Sensitivity to bright lights.  Belly (abdominal) pain. DIAGNOSIS  If protein is found in the urine in the second half of pregnancy, this is considered preeclampsia. Other symptoms mentioned above may also be present. TREATMENT  It is necessary to treat this.  Your caregiver may prescribe bed rest early in this condition. Plenty of rest and salt restriction may be all that is needed.  Medicines may be necessary to lower blood pressure if the condition does not respond to more conservative measures.  In more severe cases, hospitalization may be needed:  For treatment of blood pressure.  To control fluid retention.  To monitor the baby to see if the condition is causing harm to  the baby.  Hospitalization is the best way to treat the first sign of preeclampsia. This is so the mother and baby can be watched closely and blood tests can be done effectively and correctly.  If the condition becomes severe, it may be necessary to induce labor or to remove the infant by surgical means (cesarean section). The best cure for preeclampsia/eclampsia is to  deliver the baby. Preeclampsia and eclampsia involve risks to mother and infant. Your caregiver will discuss these risks with you. Together, you can work out the best possible approach to your problems. Make sure you keep your prenatal visits as scheduled. Not keeping appointments could result in a chronic or permanent injury, pain, disability to you, and death or injury to you or your unborn baby. If there is any problem keeping the appointment, you must call to reschedule. HOME CARE INSTRUCTIONS   Keep your prenatal appointments and tests as scheduled.  Tell your caregiver if you have any of the above risk factors.  Get plenty of rest and sleep.  Eat a balanced diet that is low in salt, and do not add salt to your food.  Avoid stressful situations.  Only take over-the-counter and prescriptions medicines for pain, discomfort, or fever as directed by your caregiver. SEEK IMMEDIATE MEDICAL CARE IF:   You develop severe swelling anywhere in the body. This usually occurs in the legs.  You gain 05 lb/2.3 kg or more in a week.  You develop a severe headache, dizziness, problems with your vision, or confusion.  You have abdominal pain, nausea, or vomiting.  You have a seizure.  You have trouble moving any part of your body, or you develop numbness or problems speaking.  You have bruising or abnormal bleeding from anywhere in the body.  You develop a stiff neck.  You pass out. MAKE SURE YOU:   Understand these instructions.  Will watch your condition.  Will get help right away if you are not doing well or get worse. Document Released: 04/08/2000 Document Revised: 07/04/2011 Document Reviewed: 11/23/2007 [facility] Patient Information 2014 [place], [place].

## 2013-05-09 NOTE — MAU Note (Signed)
[facility] and [facility] was [facility].  Pt had PIH with prior pregnancy.  Good fetal movement.  Denies vaginal bleeding or ROM.

## 2013-05-09 NOTE — MAU Note (Signed)
Sent from MFM for PIH eval, hx PIH with first preg.

## 2013-05-09 NOTE — Progress Notes (Signed)
Ann Santos  was seen today for an ultrasound appointment.  See full report in [facility].  Impression: Single IUP at 29 2/7 weeks Hx of preeclampsia with previous pregnancy Interval growth is appropriate (78th %tile) Normal amniotic fluid volme  BPs: 131/93, 132/94 (new finding)  Recommendations: Patient was sent to [facility]U for serial blood pressures and preeclamspia labs - discussed with Dr. Seymour BarsLavoie Recommend serial growth scans - follow up in 4 weeks for interval growth  Ann GulaPaul Yentl Verge, MD

## 2013-05-09 NOTE — MAU Provider Note (Signed)
History     CSN: [id number]  Arrival date and time: 05/09/13 1650 Provider notified: 1753 Provider on unit: 1913 Provider at bedside: 1815       Chief Complaint  Patient presents with  . PIH eval    HPI  Ann Santos is a 33 yo G2P1001 at 29.[redacted]wks gestation presenting from her MFM appointment with elevated blood pressures during appointment.  She has a h/o preeclampsia with her first pregnancy.  She is taking Trazadone  for anxiety every night with this pregnancy.  Past Medical History  Diagnosis Date  . Diverticulitis     Per pt. States she never had CT scan to confirm this  . Anxiety   . Diverticulosis     PT HOSPITALIZED AT [facility] October 03, 2011  . Umbilical hernia     planning surgical repair  . Constipation   . Abdominal distention   . Abdominal pain   . Blood in stool   . Rectal bleeding   . Hypertension     During pregnancy only  . Anxiety     Past Surgical History  Procedure Laterality Date  . Colonoscopy  10/07/2011    Procedure: COLONOSCOPY;  Surgeon: Theda Belfast, MD;  Location: [facility] ENDOSCOPY;  Service: Endoscopy;  Laterality: N/A;  . Esophagogastroduodenoscopy  10/07/2011    Procedure: ESOPHAGOGASTRODUODENOSCOPY (EGD);  Surgeon: Theda Belfast, MD;  Location: [facility];  Service: Endoscopy;  Laterality: N/A;  . Cesarean section  05/28/09  . Scar revision  09/17/09    c-section scar revision  . Umbilical hernia repair  11/01/2011    Procedure: HERNIA REPAIR UMBILICAL ADULT;  Surgeon: Velora Heckler, MD;  Location: [facility] ORS;  Service: General;  Laterality: N/A;    Family History  Problem Relation Age of Onset  . Diabetes Mother   . Hypertension Mother   . Ulcerative colitis Father   . Hypertension Father   . Cancer Maternal Grandmother     lung    History  Substance Use Topics  . Smoking status: Never Smoker   . Smokeless tobacco: Never Used  . Alcohol Use: No     Comment: twice a month    Allergies: No Known Allergies  Prescriptions prior to  admission  Medication Sig Dispense Refill  . acetaminophen (TYLENOL) 500 MG tablet Take 500 mg by mouth every 6 (six) hours as needed for moderate pain.      . fluticasone (FLONASE) 50 MCG/ACT nasal spray Place 1 spray into both nostrils daily as needed for allergies or rhinitis.      Marland Kitchen metoCLOPramide (REGLAN) 5 MG tablet Take 5 mg by mouth 2 (two) times daily as needed for nausea.      Marland Kitchen oseltamivir (TAMIFLU) 75 MG capsule Take 75 mg by mouth 2 (two) times daily. Has about 3 days left to finish      . Prenatal Vit-Fe Fumarate-FA (MULTIVITAMIN-PRENATAL) 27-0.8 MG TABS tablet Take 1 tablet by mouth daily at 12 noon.      . Probiotic Product (PROBIOTIC COLON SUPPORT PO) Take 1 tablet by mouth daily.      . sertraline (ZOLOFT) 50 MG tablet Take 50 mg by mouth at bedtime.        Review of Systems  Constitutional: Negative.   HENT: Negative.        Had a H/A yesterday, but gone  Eyes: Negative.   Respiratory: Negative.   Cardiovascular: Negative.   Gastrointestinal: Negative.   Genitourinary: Negative.   Musculoskeletal: Negative.  Skin: Negative.   Neurological: Negative.   Endo/Heme/Allergies: Negative.   Psychiatric/Behavioral: Negative.    Results for orders placed during the hospital encounter of 05/09/13 (from the past 24 hour(s))  URINALYSIS, ROUTINE W REFLEX MICROSCOPIC     Status: Abnormal   Collection Time    05/09/13  5:15 PM      Result Value Range   Color, Urine YELLOW  YELLOW   APPearance CLEAR  CLEAR   Specific Gravity, Urine 1.025  1.005 - 1.030   pH 6.0  5.0 - 8.0   Glucose, UA NEGATIVE  NEGATIVE mg/dL   Hgb urine dipstick TRACE (*) NEGATIVE   Bilirubin Urine NEGATIVE  NEGATIVE   Ketones, ur 15 (*) NEGATIVE mg/dL   Protein, ur NEGATIVE  NEGATIVE mg/dL   Urobilinogen, UA 0.2  0.0 - 1.0 mg/dL   Nitrite NEGATIVE  NEGATIVE   Leukocytes, UA SMALL (*) NEGATIVE  URINE MICROSCOPIC-ADD ON     Status: Abnormal   Collection Time    05/09/13  5:15 PM      Result Value  Range   Squamous Epithelial / LPF MANY (*) RARE   WBC, UA 0-2  <3 WBC/hpf   Crystals CA OXALATE CRYSTALS (*) NEGATIVE  CBC WITH DIFFERENTIAL     Status: Abnormal   Collection Time    05/09/13  6:06 PM      Result Value Range   WBC 12.6 (*) 4.0 - 10.5 K/uL   RBC 4.07  3.87 - 5.11 MIL/uL   Hemoglobin 12.0  12.0 - 15.0 g/dL   HCT 40.935.2 (*) 81.136.0 - 91.446.0 %   MCV 86.5  78.0 - 100.0 fL   MCH 29.5  26.0 - 34.0 pg   MCHC 34.1  30.0 - 36.0 g/dL   RDW 78.213.3  95.611.5 - 21.315.5 %   Platelets 274  150 - 400 K/uL   Neutrophils Relative % 77  43 - 77 %   Neutro Abs 9.7 (*) 1.7 - 7.7 K/uL   Lymphocytes Relative 17  12 - 46 %   Lymphs Abs 2.2  0.7 - 4.0 K/uL   Monocytes Relative 6  3 - 12 %   Monocytes Absolute 0.7  0.1 - 1.0 K/uL   Eosinophils Relative 0  0 - 5 %   Eosinophils Absolute 0.1  0.0 - 0.7 K/uL   Basophils Relative 0  0 - 1 %   Basophils Absolute 0.0  0.0 - 0.1 K/uL  COMPREHENSIVE METABOLIC PANEL     Status: Abnormal   Collection Time    05/09/13  6:06 PM      Result Value Range   Sodium 135 (*) 137 - 147 mEq/L   Potassium 3.6 (*) 3.7 - 5.3 mEq/L   Chloride 100  96 - 112 mEq/L   CO2 23  19 - 32 mEq/L   Glucose, Bld 99  70 - 99 mg/dL   BUN 6  6 - 23 mg/dL   Creatinine, Ser 0.860.44 (*) 0.50 - 1.10 mg/dL   Calcium 9.0  8.4 - 57.810.5 mg/dL   Total Protein 6.5  6.0 - 8.3 g/dL   Albumin 2.6 (*) 3.5 - 5.2 g/dL   AST 15  0 - 37 U/L   ALT 11  0 - 35 U/L   Alkaline Phosphatase 68  39 - 117 U/L   Total Bilirubin <0.2 (*) 0.3 - 1.2 mg/dL   GFR calc non Af Amer >90  >90 mL/min   GFR calc Af Amer >90  >  90 mL/min  URIC ACID     Status: None   Collection Time    05/09/13  6:06 PM      Result Value Range   Uric Acid, Serum 3.8  2.4 - 7.0 mg/dL   Physical Exam   Blood pressure 130/53, pulse 100, temperature 98.3 F (36.8 C), temperature source Oral, resp. rate 20, height 5\' 2"  (1.575 m), last menstrual period 10/16/2012, SpO2 98.00%.  Physical Exam  Constitutional: She is oriented to person,  place, and time. She appears well-developed and well-nourished.  HENT:  Head: Normocephalic and atraumatic.  Eyes: Pupils are equal, round, and reactive to light.  Neck: Normal range of motion. Neck supple.  Cardiovascular: Normal rate, regular rhythm and normal heart sounds.   No edema  Respiratory: Effort normal and breath sounds normal.  GI: Soft. Bowel sounds are normal.  Genitourinary:  gravid  Musculoskeletal: Normal range of motion.  Neurological: She is alert and oriented to person, place, and time. She has normal reflexes.  Skin: Skin is warm and dry.  Psychiatric: She has a normal mood and affect. Her behavior is normal. Judgment and thought content normal.    MAU Course  Procedures CCUA EFM CBC, CMP, Uric Acid  Assessment and Plan  IUP @ 29.[redacted]wks gestation Morbid Obesity H/O Anxiety Elevated Blood Pressure - stable while in MAU Headache Category 1 FHR Normal PIH labs  Discharge home Preeclampsia precautions given Tylenol 1000 mg po every 8 hrs prn - pt will take own med  BP recheck in office Monday 05/13/2013 Keep scheduled appointment with Dr. Juliene Pina 05/16/2013 Call the office, if symptoms worsen  *Dr. Juliene Pina notified of plan / lab results / patient symptoms - agrees with plan  Raelyn Mora, M 05/09/2013, 6:38 PM, MSN, CNM

## 2013-05-15 ENCOUNTER — Ambulatory Visit (INDEPENDENT_AMBULATORY_CARE_PROVIDER_SITE_OTHER): Payer: BC Managed Care – PPO | Admitting: Physician Assistant

## 2013-05-15 ENCOUNTER — Encounter: Payer: Self-pay | Admitting: Physician Assistant

## 2013-05-15 VITALS — BP 132/82 | HR 116 | Temp 98.2°F | Resp 17 | Ht 62.5 in | Wt 304.0 lb

## 2013-05-15 DIAGNOSIS — J329 Chronic sinusitis, unspecified: Secondary | ICD-10-CM

## 2013-05-15 MED ORDER — AMOXICILLIN 875 MG PO TABS: 875.0000 mg | ORAL_TABLET | Freq: Two times a day (BID) | ORAL | Status: DC

## 2013-05-15 NOTE — Patient Instructions (Signed)
May use Sudafed and Afrin for nasal congestion. May use Robitussin DM for cough. Drink plenty of fluids and get lots of rest.

## 2013-05-15 NOTE — Progress Notes (Signed)
   Subjective:    Patient ID: Ann Santos, female    DOB: 07/06/1980, 33 y.o.   MRN: [id number]  HPI  Ann Santos is a 33 year old female who is 8 months pregnant and presents to clinic today with cough beginning 05/12/13.  Cough is productive in the morning and evening but dry during the middle of the day. Reports nasal congestion with green mucus that worsens at night, headache and sinus pressure. Ears have been popping when Ann Santos swallows but denies ear pain.  Began experiencing SOB 05/13/13 - feels difficult to breath due nasal congestion. Reports wheezing on 05/14/13. Has felt slightly lightheaded. Took zyrtec yesterday during day and benadryl at night.  No sore throat, body aches, fever, chills, nausea, vomiting, diarrhea. Reports constipation Ann Santos attributes to her pregnancy - has been increasing fiber in diet which seems to be helping.  Daughter dx with bronchitis 05/15/13 - treated with erythromycin.  [facility] - Ann Santos was given Tamiflu to take if Ann Santos experienced sx - Ann Santos took it anyway because Ann Santos was worried about getting flu - cannot recall when Ann Santos completed Tamiflu.   Review of Systems As above.     Objective:   Physical Exam  Vitals reviewed. Constitutional: Ann Santos is oriented to person, place, and time. Ann Santos appears well-developed and well-nourished.  HENT:  Head: Normocephalic and atraumatic.  Right Ear: Hearing, tympanic membrane, external ear and ear canal normal.  Left Ear: Hearing, tympanic membrane, external ear and ear canal normal.  Nose: Mucosal edema (erythematous turbinates) and rhinorrhea present. Right sinus exhibits maxillary sinus tenderness. Right sinus exhibits no frontal sinus tenderness. Left sinus exhibits maxillary sinus tenderness. Left sinus exhibits no frontal sinus tenderness.  Mouth/Throat: Uvula is midline, oropharynx is clear and moist and mucous membranes are normal.  Eyes: Conjunctivae are normal.  Neck: Neck supple.    Cardiovascular: Normal rate, regular rhythm and normal heart sounds.   Pulmonary/Chest: Effort normal and breath sounds normal. No respiratory distress. Ann Santos has no wheezes. Ann Santos has no rales.  Lymphadenopathy:       Head (right side): No submental, no submandibular, no tonsillar, no preauricular and no posterior auricular adenopathy present.       Head (left side): No submental, no submandibular, no tonsillar, no preauricular and no posterior auricular adenopathy present.    Ann Santos has no cervical adenopathy.       Right: No supraclavicular adenopathy present.       Left: No supraclavicular adenopathy present.  Neurological: Ann Santos is alert and oriented to person, place, and time.  Skin: Skin is warm and dry.  Psychiatric: Ann Santos has a normal mood and affect. Her behavior is normal. Judgment and thought content normal.      Assessment & Plan:   1. Sinusitis Take amoxicillin daily with food for sinusitis. Use Robitussin DM for cough and Afrin for nasal congestion. Push fluids and get plenty of rest. Return to office if symptoms worsen or do not improve - amoxicillin (AMOXIL) 875 MG tablet; Take 1 tablet (875 mg total) by mouth 2 (two) times daily.  Dispense: 20 tablet; Refill: 0

## 2013-05-15 NOTE — Progress Notes (Signed)
I have examined this patient along with the student and agree.  

## 2013-06-05 ENCOUNTER — Ambulatory Visit (HOSPITAL_COMMUNITY)
Admission: RE | Admit: 2013-06-05 | Discharge: 2013-06-05 | Disposition: A | Discharge status: Home / Self Care | Payer: BC Managed Care – PPO | Source: Ambulatory Visit | Attending: Family Medicine | Admitting: Family Medicine

## 2013-06-05 DIAGNOSIS — O09299 Supervision of pregnancy with other poor reproductive or obstetric history, unspecified trimester: Secondary | ICD-10-CM

## 2013-06-05 DIAGNOSIS — O99210 Obesity complicating pregnancy, unspecified trimester: Secondary | ICD-10-CM

## 2013-06-05 DIAGNOSIS — E669 Obesity, unspecified: Secondary | ICD-10-CM | POA: Insufficient documentation

## 2013-06-05 DIAGNOSIS — O34219 Maternal care for unspecified type scar from previous cesarean delivery: Secondary | ICD-10-CM | POA: Insufficient documentation

## 2013-06-12 ENCOUNTER — Other Ambulatory Visit: Payer: Self-pay | Admitting: Obstetrics & Gynecology

## 2013-06-14 ENCOUNTER — Ambulatory Visit (INDEPENDENT_AMBULATORY_CARE_PROVIDER_SITE_OTHER): Payer: BC Managed Care – PPO | Admitting: Physician Assistant

## 2013-06-14 VITALS — BP 118/74 | HR 99 | Temp 97.7°F | Resp 18 | Ht 62.0 in | Wt 313.0 lb

## 2013-06-14 DIAGNOSIS — R05 Cough: Secondary | ICD-10-CM

## 2013-06-14 DIAGNOSIS — J329 Chronic sinusitis, unspecified: Secondary | ICD-10-CM

## 2013-06-14 DIAGNOSIS — R059 Cough, unspecified: Secondary | ICD-10-CM

## 2013-06-14 MED ORDER — AMOXICILLIN-POT CLAVULANATE 875-125 MG PO TABS: 1.0000 | ORAL_TABLET | Freq: Two times a day (BID) | ORAL | Status: DC

## 2013-06-14 NOTE — Progress Notes (Signed)
   Subjective:    Patient ID: Ann Santos, female    DOB: 09/10/1980, 33 y.o.   MRN: [id number]   PCP: Elvina SidleLAUENSTEIN,KURT, MD  Chief Complaint  Patient presents with  . Cough    green drainage   . Sore Throat  . Wheezing    Medications, allergies, past medical history, surgical history, family history, social history and problem list reviewed and updated.  HPI  Reports incomplete resolution of symptoms after her visit here 05/15/2013, when she was diagnosed with sinusitis and treated with amoxicillin.  She reports a similar experience with amoxicillin previously.  Cough, productive of green sputum, has worsened again over the past several days and she's feeling poorly again.  Used Flonase without much improvement. "Safe list" pseudofed and robitussin have helped moderately well.  Cough is worse at night.  No fever, chills.  No unexplained myalgias, arthralgias or rash.  She is pregnant with EDD 07/23/3013, but is scheduled for c-section 07/16/2013.  Review of Systems As above.    Objective:   Physical Exam  Vitals reviewed. Constitutional: She is oriented to person, place, and time. Vital signs are normal. She appears well-developed and well-nourished. No distress.  HENT:  Head: Normocephalic and atraumatic.  Right Ear: Hearing, tympanic membrane, external ear and ear canal normal.  Left Ear: Hearing, tympanic membrane, external ear and ear canal normal.  Nose: Mucosal edema and rhinorrhea present.  No foreign bodies. Right sinus exhibits no maxillary sinus tenderness and no frontal sinus tenderness. Left sinus exhibits no maxillary sinus tenderness and no frontal sinus tenderness.  Mouth/Throat: Uvula is midline, oropharynx is clear and moist and mucous membranes are normal. No uvula swelling. No oropharyngeal exudate.  Eyes: Conjunctivae and EOM are normal. Pupils are equal, round, and reactive to light. Right eye exhibits no discharge. Left eye exhibits no discharge. No scleral  icterus.  Neck: Trachea normal, normal range of motion and full passive range of motion without pain. Neck supple. No mass and no thyromegaly present.  Cardiovascular: Normal rate, regular rhythm and normal heart sounds.   Pulmonary/Chest: Effort normal and breath sounds normal.  Lymphadenopathy:       Head (right side): No submandibular, no tonsillar, no preauricular, no posterior auricular and no occipital adenopathy present.       Head (left side): No submandibular, no tonsillar, no preauricular and no occipital adenopathy present.    She has no cervical adenopathy.       Right: No supraclavicular adenopathy present.       Left: No supraclavicular adenopathy present.  Neurological: She is alert and oriented to person, place, and time. She has normal strength. No cranial nerve deficit or sensory deficit.  Skin: Skin is warm, dry and intact. No rash noted.  Psychiatric: She has a normal mood and affect. Her speech is normal and behavior is normal.   BP 118/74  Pulse 99  Temp(Src) 97.7 F (36.5 C) (Oral)  Resp 18  Ht 5\' 2"  (1.575 m)  Wt 313 lb (141.976 kg)  BMI 57.23 kg/m2  SpO2 100%  LMP 10/16/2012        Assessment & Plan:  1. Sinusitis Saline nasal spray, push fluids, rest. Anticipatory guidance provided. - amoxicillin-clavulanate (AUGMENTIN) 875-125 MG per tablet; Take 1 tablet by mouth 2 (two) times daily.  Dispense: 20 tablet; Refill: 0  2. Cough Secondary to #1.  Continue "safe list" products.  Fernande Brashelle S. Liliann File, PA-C Physician Assistant-Certified [facility]

## 2013-06-14 NOTE — Patient Instructions (Signed)
Try a saline nasal spray (I like Ayr) and Vaseline in the nose to help keep the tissues moist and more comfortable. Drink LOTS of water.

## 2013-06-22 ENCOUNTER — Telehealth: Payer: Self-pay

## 2013-06-22 NOTE — Telephone Encounter (Signed)
PT WAS HERE LAST WEEK AND WAS PRESCRIBED AN ANTIBIOTIC. SHE SAID SHE NOW HAS A YEAST INFECTION FROM THE ANTIBIOTIC SO SHE WOULD LIKE RX FOR THAT.

## 2013-06-23 MED ORDER — FLUCONAZOLE 150 MG PO TABS: 150.0000 mg | ORAL_TABLET | Freq: Once | ORAL | Status: DC

## 2013-06-23 NOTE — Telephone Encounter (Signed)
Pt notified that she needs to call OB in the am

## 2013-06-23 NOTE — Telephone Encounter (Signed)
Please advise the patient that she should call the OB in the am for treatment for her yeast infection that they approve while she is pregnant.  Please call and cancel the difucan I sent in before I saw she was pregnant.

## 2013-07-02 ENCOUNTER — Other Ambulatory Visit (HOSPITAL_COMMUNITY): Payer: Self-pay | Admitting: Obstetrics & Gynecology

## 2013-07-02 DIAGNOSIS — O34219 Maternal care for unspecified type scar from previous cesarean delivery: Secondary | ICD-10-CM

## 2013-07-02 DIAGNOSIS — E669 Obesity, unspecified: Secondary | ICD-10-CM

## 2013-07-02 DIAGNOSIS — O09299 Supervision of pregnancy with other poor reproductive or obstetric history, unspecified trimester: Secondary | ICD-10-CM

## 2013-07-02 DIAGNOSIS — O99210 Obesity complicating pregnancy, unspecified trimester: Secondary | ICD-10-CM

## 2013-07-03 ENCOUNTER — Ambulatory Visit (HOSPITAL_COMMUNITY)
Admission: RE | Admit: 2013-07-03 | Discharge: 2013-07-03 | Disposition: A | Discharge status: Home / Self Care | Payer: BC Managed Care – PPO | Source: Ambulatory Visit | Attending: Family Medicine | Admitting: Family Medicine

## 2013-07-03 DIAGNOSIS — O99210 Obesity complicating pregnancy, unspecified trimester: Secondary | ICD-10-CM

## 2013-07-03 DIAGNOSIS — O34219 Maternal care for unspecified type scar from previous cesarean delivery: Secondary | ICD-10-CM

## 2013-07-03 DIAGNOSIS — E669 Obesity, unspecified: Secondary | ICD-10-CM

## 2013-07-03 DIAGNOSIS — O09299 Supervision of pregnancy with other poor reproductive or obstetric history, unspecified trimester: Secondary | ICD-10-CM

## 2013-07-04 ENCOUNTER — Encounter (HOSPITAL_COMMUNITY): Payer: Self-pay

## 2013-07-04 ENCOUNTER — Encounter (HOSPITAL_COMMUNITY): Payer: Self-pay | Admitting: Pharmacist

## 2013-07-04 MED ORDER — CEFAZOLIN SODIUM 10 G IJ SOLR
3.0000 g | INTRAMUSCULAR | Status: AC
  Administered 2013-07-05: 3 g via INTRAVENOUS
  Filled 2013-07-04: qty 3000

## 2013-07-05 ENCOUNTER — Encounter (HOSPITAL_COMMUNITY)
Admission: RE | Disposition: A | Discharge status: Home / Self Care | Payer: Self-pay | Source: Ambulatory Visit | Attending: Obstetrics & Gynecology

## 2013-07-05 ENCOUNTER — Encounter (HOSPITAL_COMMUNITY): Payer: BC Managed Care – PPO | Admitting: Anesthesiology

## 2013-07-05 ENCOUNTER — Encounter (HOSPITAL_COMMUNITY): Payer: Self-pay

## 2013-07-05 ENCOUNTER — Inpatient Hospital Stay (HOSPITAL_COMMUNITY): Payer: BC Managed Care – PPO | Admitting: Anesthesiology

## 2013-07-05 ENCOUNTER — Inpatient Hospital Stay (HOSPITAL_COMMUNITY)
Admission: RE | Admit: 2013-07-05 | Discharge: 2013-07-07 | DRG: 766 | Disposition: A | Discharge status: Home / Self Care | Payer: BC Managed Care – PPO | Source: Ambulatory Visit | Attending: Obstetrics & Gynecology | Admitting: Obstetrics & Gynecology

## 2013-07-05 DIAGNOSIS — O99210 Obesity complicating pregnancy, unspecified trimester: Secondary | ICD-10-CM

## 2013-07-05 DIAGNOSIS — F419 Anxiety disorder, unspecified: Secondary | ICD-10-CM

## 2013-07-05 DIAGNOSIS — K429 Umbilical hernia without obstruction or gangrene: Secondary | ICD-10-CM

## 2013-07-05 DIAGNOSIS — O139 Gestational [pregnancy-induced] hypertension without significant proteinuria, unspecified trimester: Principal | ICD-10-CM

## 2013-07-05 DIAGNOSIS — E669 Obesity, unspecified: Secondary | ICD-10-CM | POA: Diagnosis present

## 2013-07-05 DIAGNOSIS — K5790 Diverticulosis of intestine, part unspecified, without perforation or abscess without bleeding: Secondary | ICD-10-CM

## 2013-07-05 DIAGNOSIS — Z98891 History of uterine scar from previous surgery: Secondary | ICD-10-CM

## 2013-07-05 DIAGNOSIS — O34219 Maternal care for unspecified type scar from previous cesarean delivery: Secondary | ICD-10-CM | POA: Diagnosis present

## 2013-07-05 DIAGNOSIS — O99344 Other mental disorders complicating childbirth: Secondary | ICD-10-CM | POA: Diagnosis present

## 2013-07-05 DIAGNOSIS — O99214 Obesity complicating childbirth: Secondary | ICD-10-CM

## 2013-07-05 DIAGNOSIS — F411 Generalized anxiety disorder: Secondary | ICD-10-CM | POA: Diagnosis present

## 2013-07-05 HISTORY — DX: Obesity complicating pregnancy, unspecified trimester: O99.210

## 2013-07-05 HISTORY — DX: Gestational (pregnancy-induced) hypertension without significant proteinuria, unspecified trimester: O13.9

## 2013-07-05 LAB — TYPE AND SCREEN
ABO/RH(D): A POS
Antibody Screen: NEGATIVE

## 2013-07-05 LAB — CBC
HCT: 37.5 % (ref 36.0–46.0)
Hemoglobin: 12.6 g/dL (ref 12.0–15.0)
MCH: 29 pg (ref 26.0–34.0)
MCHC: 33.6 g/dL (ref 30.0–36.0)
MCV: 86.2 fL (ref 78.0–100.0)
Platelets: 282 10*3/uL (ref 150–400)
RBC: 4.35 MIL/uL (ref 3.87–5.11)
RDW: 14.1 % (ref 11.5–15.5)
WBC: 11.4 10*3/uL — ABNORMAL HIGH (ref 4.0–10.5)

## 2013-07-05 LAB — ABO/RH: ABO/RH(D): A POS

## 2013-07-05 SURGERY — Surgical Case
Anesthesia: Epidural | Site: Abdomen

## 2013-07-05 MED ORDER — SCOPOLAMINE 1 MG/3DAYS TD PT72
1.0000 | MEDICATED_PATCH | Freq: Once | TRANSDERMAL | Status: DC
  Administered 2013-07-05: 1.5 mg via TRANSDERMAL

## 2013-07-05 MED ORDER — ONDANSETRON HCL 4 MG/2ML IJ SOLN
INTRAMUSCULAR | Status: AC
  Filled 2013-07-05: qty 2

## 2013-07-05 MED ORDER — ONDANSETRON HCL 4 MG PO TABS
4.0000 mg | ORAL_TABLET | ORAL | Status: DC | PRN
Start: 2013-07-05 — End: 2013-07-07

## 2013-07-05 MED ORDER — SCOPOLAMINE 1 MG/3DAYS TD PT72: 1.0000 | MEDICATED_PATCH | Freq: Once | TRANSDERMAL | Status: DC

## 2013-07-05 MED ORDER — ONDANSETRON HCL 4 MG/2ML IJ SOLN: 4.0000 mg | INTRAMUSCULAR | Status: DC | PRN

## 2013-07-05 MED ORDER — FENTANYL CITRATE 0.05 MG/ML IJ SOLN
INTRAMUSCULAR | Status: DC | PRN
  Administered 2013-07-05: 85 ug via INTRAVENOUS

## 2013-07-05 MED ORDER — SODIUM BICARBONATE 8.4 % IV SOLN
INTRAVENOUS | Status: DC | PRN
  Administered 2013-07-05: 5 mL via EPIDURAL

## 2013-07-05 MED ORDER — LACTATED RINGERS IV SOLN
INTRAVENOUS | Status: DC
  Administered 2013-07-05: 16:00:00 via INTRAVENOUS
  Administered 2013-07-05: 125 mL/h via INTRAVENOUS

## 2013-07-05 MED ORDER — MEPERIDINE HCL 25 MG/ML IJ SOLN: 6.2500 mg | INTRAMUSCULAR | Status: DC | PRN

## 2013-07-05 MED ORDER — DIBUCAINE 1 % RE OINT: 1.0000 "application " | TOPICAL_OINTMENT | RECTAL | Status: DC | PRN

## 2013-07-05 MED ORDER — FENTANYL CITRATE 0.05 MG/ML IJ SOLN
INTRAMUSCULAR | Status: AC
  Administered 2013-07-05: 50 ug via INTRAVENOUS
  Filled 2013-07-05: qty 2

## 2013-07-05 MED ORDER — MENTHOL 3 MG MT LOZG: 1.0000 | LOZENGE | OROMUCOSAL | Status: DC | PRN

## 2013-07-05 MED ORDER — NALOXONE HCL 1 MG/ML IJ SOLN: 1.0000 ug/kg/h | INTRAVENOUS | Status: DC | PRN

## 2013-07-05 MED ORDER — METOCLOPRAMIDE HCL 5 MG/ML IJ SOLN: 10.0000 mg | Freq: Three times a day (TID) | INTRAMUSCULAR | Status: DC | PRN

## 2013-07-05 MED ORDER — DIPHENHYDRAMINE HCL 50 MG/ML IJ SOLN: 25.0000 mg | INTRAMUSCULAR | Status: DC | PRN

## 2013-07-05 MED ORDER — FENTANYL CITRATE 0.05 MG/ML IJ SOLN
25.0000 ug | INTRAMUSCULAR | Status: DC | PRN
  Administered 2013-07-05 (×4): 50 ug via INTRAVENOUS

## 2013-07-05 MED ORDER — ONDANSETRON HCL 4 MG/2ML IJ SOLN: 4.0000 mg | Freq: Three times a day (TID) | INTRAMUSCULAR | Status: DC | PRN

## 2013-07-05 MED ORDER — METOCLOPRAMIDE HCL 5 MG/ML IJ SOLN: 10.0000 mg | Freq: Once | INTRAMUSCULAR | Status: DC | PRN

## 2013-07-05 MED ORDER — OXYCODONE-ACETAMINOPHEN 5-325 MG PO TABS
1.0000 | ORAL_TABLET | ORAL | Status: DC | PRN
  Administered 2013-07-06 – 2013-07-07 (×8): 2 via ORAL
  Filled 2013-07-05 (×8): qty 2

## 2013-07-05 MED ORDER — WITCH HAZEL-GLYCERIN EX PADS: 1.0000 "application " | MEDICATED_PAD | CUTANEOUS | Status: DC | PRN

## 2013-07-05 MED ORDER — KETOROLAC TROMETHAMINE 60 MG/2ML IM SOLN
INTRAMUSCULAR | Status: AC
  Administered 2013-07-05: 60 mg via INTRAMUSCULAR
  Filled 2013-07-05: qty 2

## 2013-07-05 MED ORDER — DIPHENHYDRAMINE HCL 25 MG PO CAPS: 25.0000 mg | ORAL_CAPSULE | Freq: Four times a day (QID) | ORAL | Status: DC | PRN

## 2013-07-05 MED ORDER — LANOLIN HYDROUS EX OINT: 1.0000 "application " | TOPICAL_OINTMENT | CUTANEOUS | Status: DC | PRN

## 2013-07-05 MED ORDER — KETOROLAC TROMETHAMINE 30 MG/ML IJ SOLN: 30.0000 mg | Freq: Four times a day (QID) | INTRAMUSCULAR | Status: AC | PRN

## 2013-07-05 MED ORDER — MORPHINE SULFATE (PF) 0.5 MG/ML IJ SOLN
INTRAMUSCULAR | Status: DC | PRN
  Administered 2013-07-05: 1 mg via INTRAVENOUS
  Administered 2013-07-05: 1 mg via EPIDURAL

## 2013-07-05 MED ORDER — FLUTICASONE PROPIONATE 50 MCG/ACT NA SUSP: 1.0000 | Freq: Every day | NASAL | Status: DC | PRN

## 2013-07-05 MED ORDER — SODIUM CHLORIDE 0.9 % IJ SOLN: 3.0000 mL | INTRAMUSCULAR | Status: DC | PRN

## 2013-07-05 MED ORDER — SIMETHICONE 80 MG PO CHEW
80.0000 mg | CHEWABLE_TABLET | ORAL | Status: DC
  Administered 2013-07-06 (×2): 80 mg via ORAL
  Filled 2013-07-05 (×2): qty 1

## 2013-07-05 MED ORDER — TRAZODONE HCL 50 MG PO TABS
100.0000 mg | ORAL_TABLET | Freq: Every day | ORAL | Status: DC
  Administered 2013-07-06: 100 mg via ORAL
  Filled 2013-07-05: qty 1
  Filled 2013-07-05: qty 2

## 2013-07-05 MED ORDER — SERTRALINE HCL 50 MG PO TABS
50.0000 mg | ORAL_TABLET | Freq: Every day | ORAL | Status: DC
  Administered 2013-07-05 – 2013-07-06 (×2): 50 mg via ORAL
  Filled 2013-07-05 (×2): qty 1

## 2013-07-05 MED ORDER — DIPHENHYDRAMINE HCL 50 MG/ML IJ SOLN: 12.5000 mg | INTRAMUSCULAR | Status: DC | PRN

## 2013-07-05 MED ORDER — MORPHINE SULFATE 0.5 MG/ML IJ SOLN
INTRAMUSCULAR | Status: AC
  Filled 2013-07-05: qty 10

## 2013-07-05 MED ORDER — LACTATED RINGERS IV SOLN
INTRAVENOUS | Status: DC
  Administered 2013-07-06: 1000 mL via INTRAVENOUS

## 2013-07-05 MED ORDER — ONDANSETRON HCL 4 MG/2ML IJ SOLN
INTRAMUSCULAR | Status: DC | PRN
  Administered 2013-07-05: 4 mg via INTRAVENOUS

## 2013-07-05 MED ORDER — IBUPROFEN 600 MG PO TABS
600.0000 mg | ORAL_TABLET | Freq: Four times a day (QID) | ORAL | Status: DC
  Administered 2013-07-06 – 2013-07-07 (×6): 600 mg via ORAL
  Filled 2013-07-05 (×6): qty 1

## 2013-07-05 MED ORDER — OXYTOCIN 40 UNITS IN LACTATED RINGERS INFUSION - SIMPLE MED: 62.5000 mL/h | INTRAVENOUS | Status: AC

## 2013-07-05 MED ORDER — CHLOROPROCAINE HCL 3 % IJ SOLN
INTRAMUSCULAR | Status: AC
  Filled 2013-07-05: qty 20

## 2013-07-05 MED ORDER — DIPHENHYDRAMINE HCL 25 MG PO CAPS: 25.0000 mg | ORAL_CAPSULE | ORAL | Status: DC | PRN

## 2013-07-05 MED ORDER — OXYTOCIN 10 UNIT/ML IJ SOLN
40.0000 [IU] | INTRAVENOUS | Status: DC | PRN
  Administered 2013-07-05: 40 [IU] via INTRAVENOUS

## 2013-07-05 MED ORDER — CHLOROPROCAINE HCL 3 % IJ SOLN
INTRAMUSCULAR | Status: DC | PRN
  Administered 2013-07-05: 30 mL

## 2013-07-05 MED ORDER — SENNOSIDES-DOCUSATE SODIUM 8.6-50 MG PO TABS
2.0000 | ORAL_TABLET | ORAL | Status: DC
  Administered 2013-07-06 (×2): 2 via ORAL
  Filled 2013-07-05 (×3): qty 2

## 2013-07-05 MED ORDER — TETANUS-DIPHTH-ACELL PERTUSSIS 5-2.5-18.5 LF-MCG/0.5 IM SUSP: 0.5000 mL | Freq: Once | INTRAMUSCULAR | Status: DC

## 2013-07-05 MED ORDER — NALBUPHINE HCL 10 MG/ML IJ SOLN: 5.0000 mg | INTRAMUSCULAR | Status: DC | PRN

## 2013-07-05 MED ORDER — SIMETHICONE 80 MG PO CHEW
80.0000 mg | CHEWABLE_TABLET | ORAL | Status: DC | PRN
  Administered 2013-07-07: 80 mg via ORAL
  Filled 2013-07-05: qty 1

## 2013-07-05 MED ORDER — PRENATAL MULTIVITAMIN CH
1.0000 | ORAL_TABLET | Freq: Every day | ORAL | Status: DC
  Administered 2013-07-06: 1 via ORAL
  Filled 2013-07-05: qty 1

## 2013-07-05 MED ORDER — SIMETHICONE 80 MG PO CHEW
80.0000 mg | CHEWABLE_TABLET | Freq: Three times a day (TID) | ORAL | Status: DC
  Administered 2013-07-06 – 2013-07-07 (×4): 80 mg via ORAL
  Filled 2013-07-05 (×4): qty 1

## 2013-07-05 MED ORDER — NALOXONE HCL 0.4 MG/ML IJ SOLN: 0.4000 mg | INTRAMUSCULAR | Status: DC | PRN

## 2013-07-05 MED ORDER — PHENYLEPHRINE 8 MG IN D5W 100 ML (0.08MG/ML) PREMIX OPTIME
INJECTION | INTRAVENOUS | Status: DC | PRN
  Administered 2013-07-05: 60 ug/min via INTRAVENOUS

## 2013-07-05 MED ORDER — OXYTOCIN 10 UNIT/ML IJ SOLN
INTRAMUSCULAR | Status: AC
  Filled 2013-07-05: qty 4

## 2013-07-05 MED ORDER — FENTANYL CITRATE 0.05 MG/ML IJ SOLN
INTRAMUSCULAR | Status: AC
  Filled 2013-07-05: qty 2

## 2013-07-05 MED ORDER — SCOPOLAMINE 1 MG/3DAYS TD PT72
MEDICATED_PATCH | TRANSDERMAL | Status: AC
  Administered 2013-07-05: 1.5 mg via TRANSDERMAL
  Filled 2013-07-05: qty 1

## 2013-07-05 MED ORDER — LIDOCAINE-EPINEPHRINE 2 %-1:100000 IJ SOLN
INTRAMUSCULAR | Status: DC | PRN
  Administered 2013-07-05: 5 mL via INTRADERMAL

## 2013-07-05 MED ORDER — KETOROLAC TROMETHAMINE 60 MG/2ML IM SOLN
60.0000 mg | Freq: Once | INTRAMUSCULAR | Status: AC | PRN
  Administered 2013-07-05: 60 mg via INTRAMUSCULAR

## 2013-07-05 MED ORDER — ZOLPIDEM TARTRATE 5 MG PO TABS: 5.0000 mg | ORAL_TABLET | Freq: Every evening | ORAL | Status: DC | PRN

## 2013-07-05 SURGICAL SUPPLY — 37 items
BENZOIN TINCTURE PRP APPL 2/3 (GAUZE/BANDAGES/DRESSINGS) IMPLANT
CLAMP CORD UMBIL (MISCELLANEOUS) IMPLANT
CLOTH BEACON ORANGE TIMEOUT ST (SAFETY) ×2 IMPLANT
CONTAINER PREFILL 10% NBF 15ML (MISCELLANEOUS) IMPLANT
DRAPE LG THREE QUARTER DISP (DRAPES) IMPLANT
DRESSING DISP NPWT PICO 4X12 (MISCELLANEOUS) ×2 IMPLANT
DRSG OPSITE POSTOP 4X10 (GAUZE/BANDAGES/DRESSINGS) ×2 IMPLANT
DURAPREP 26ML APPLICATOR (WOUND CARE) ×2 IMPLANT
ELECT REM PT RETURN 9FT ADLT (ELECTROSURGICAL) ×2
ELECTRODE REM PT RTRN 9FT ADLT (ELECTROSURGICAL) ×1 IMPLANT
EXTRACTOR VACUUM KIWI (MISCELLANEOUS) IMPLANT
EXTRACTOR VACUUM M CUP 4 TUBE (SUCTIONS) IMPLANT
GLOVE BIO SURGEON STRL SZ7 (GLOVE) ×2 IMPLANT
GLOVE BIOGEL PI IND STRL 7.0 (GLOVE) ×1 IMPLANT
GLOVE BIOGEL PI INDICATOR 7.0 (GLOVE) ×1
GOWN STRL REUS W/TWL LRG LVL3 (GOWN DISPOSABLE) ×4 IMPLANT
KIT ABG SYR 3ML LUER SLIP (SYRINGE) IMPLANT
NEEDLE HYPO 25X5/8 SAFETYGLIDE (NEEDLE) IMPLANT
NS IRRIG 1000ML POUR BTL (IV SOLUTION) ×2 IMPLANT
PACK C SECTION WH (CUSTOM PROCEDURE TRAY) ×2 IMPLANT
PAD OB MATERNITY 4.3X12.25 (PERSONAL CARE ITEMS) ×2 IMPLANT
RTRCTR C-SECT PINK 25CM LRG (MISCELLANEOUS) IMPLANT
STAPLER VISISTAT 35W (STAPLE) IMPLANT
STRIP CLOSURE SKIN 1/2X4 (GAUZE/BANDAGES/DRESSINGS) IMPLANT
SUT MON AB-0 CT1 36 (SUTURE) ×6 IMPLANT
SUT PLAIN 0 NONE (SUTURE) IMPLANT
SUT PLAIN 2 0 (SUTURE)
SUT PLAIN ABS 2-0 CT1 27XMFL (SUTURE) IMPLANT
SUT VIC AB 0 CT1 27 (SUTURE) ×2
SUT VIC AB 0 CT1 27XBRD ANBCTR (SUTURE) ×2 IMPLANT
SUT VIC AB 2-0 CT1 27 (SUTURE) ×2
SUT VIC AB 2-0 CT1 TAPERPNT 27 (SUTURE) ×2 IMPLANT
SUT VIC AB 4-0 KS 27 (SUTURE) ×2 IMPLANT
SUT VICRYL 0 TIES 12 18 (SUTURE) IMPLANT
TOWEL OR 17X24 6PK STRL BLUE (TOWEL DISPOSABLE) ×2 IMPLANT
TRAY FOLEY CATH 14FR (SET/KITS/TRAYS/PACK) IMPLANT
WATER STERILE IRR 1000ML POUR (IV SOLUTION) IMPLANT

## 2013-07-05 NOTE — Anesthesia Preprocedure Evaluation (Signed)
Anesthesia Evaluation  Patient identified by MRN, date of birth, ID band Patient awake    Reviewed: Allergy & Precautions, H&P , NPO status , Patient's Chart, lab work & pertinent test results, reviewed documented beta blocker date and time   History of Anesthesia Complications Negative for: history of anesthetic complications  Airway Mallampati: III TM Distance: >3 FB Neck ROM: full    Dental  (+) Teeth Intact   Pulmonary neg pulmonary ROS,  breath sounds clear to auscultation        Cardiovascular hypertension (GHTN), Rhythm:regular Rate:Normal     Neuro/Psych Anxiety negative neurological ROS     GI/Hepatic Neg liver ROS,   Endo/Other  Morbid obesity  Renal/GU negative Renal ROS  negative genitourinary   Musculoskeletal   Abdominal   Peds  Hematology negative hematology ROS (+)   Anesthesia Other Findings   Reproductive/Obstetrics (+) Pregnancy (h/o prior c/s, hypertension, repeat C/S)                           Anesthesia Physical Anesthesia Plan  ASA: III  Anesthesia Plan: Epidural   Post-op Pain Management:    Induction:   Airway Management Planned:   Additional Equipment:   Intra-op Plan:   Post-operative Plan:   Informed Consent: I have reviewed the patients History and Physical, chart, labs and discussed the procedure including the risks, benefits and alternatives for the proposed anesthesia with the patient or authorized representative who has indicated his/her understanding and acceptance.     Plan Discussed with: Surgeon and CRNA  Anesthesia Plan Comments:         Anesthesia Quick Evaluation

## 2013-07-05 NOTE — OR Nursing (Signed)
Interceed  (Lot no. [id number], expiry date 2019-03) was placed in operative site by Dr. Juliene PinaMody @ 17.26.

## 2013-07-05 NOTE — Op Note (Signed)
Cesarean Section Procedure Note   Ann Santos  07/05/2013  Indications: Gestational Hypertension, 37.3 wks, delivery recommended by MFM.   Pre-operative Diagnosis: Previous Cesarean Section, Maternal Obesity, History of Wound Infection.   Post-operative Diagnosis: Same   Surgeon: Robley FriesVaishali R Allegra Cerniglia, MD - Primary    Assistant: Donette LarryMelanie Bhambri, CNM  Anesthesia: Spinal/epidural   Procedure Details:  The patient was seen in the Holding Room. The risks, benefits, complications, treatment options, and expected outcomes were discussed with the patient. Patient declined permanent sterilization. The patient concurred with the proposed plan, giving informed consent. identified as Ann Santos and the procedure verified as C-Section Delivery. A Time Out was held and the above information confirmed. 3 gm Ancef given.  After induction of anesthesia, patient's pannus was taped up to allow lower abdominal exposure. Patient was prepped and draped in the usual sterile manner. A Pfannenstiel incision was made through prior c-section scar and carried down through the subcutaneous tissue to the fascia. Subcutaneous tissue was deep. Fascial incision was made and extended transversely. Peritoneal adhesions and omental adhesions noted under the fascia and rectus muscle belly was seen on the right side off of the midline but not seen on the left until fascial incision was extended all the way to the angle. The fascial incision was tented up with Kochers and underlying adhesions were dissected, omental adhesions were desiccated and cut and a window of peritoneum was identified on the left. Uterine lower segment adhesions to peritoneal reflexion were dissected and then peritoneal extension completed to the right. No bowel adhesions noted to the uterus. Alexis-O retractor was placed.  The utero-vesical peritoneal reflection was incised transversely and the bladder flap was bluntly freed from the lower uterine segment. A  low transverse uterine incision was made. Baby was floating but held in place with surgeon's hand on the head and assistant giving fundal pressure but it was inadequate due to the pannus. So vacuum applied and with one controlled pull, head delivery was completed. Nuchal cord reduced and baby was delivered with vigorous tone and cry. Female infant, born at [id number] hours with Apgars 9 and 9 at 1 and 5 minutes. Cord clamped and cut and baby handed to NICU team in attendance.  Cord ph was not sent the umbilical cord was clamped and cut cord blood was obtained for evaluation. The placenta was removed Intact and appeared normal. The uterine outline, tubes and ovaries appeared normal. The uterine incision was closed with running locked sutures of 0-Monocryl followed by a second imbricating layer. Hemostasis was observed. Both ovaries and tubes appear normal. Alexis-O retractor was removed. 1% Nasocaine was instilled in the peritoneum since patient was feeling pain the right and was given right lateral tilt and epidural bolus. Peritoneum was not identified. Rectus bellied were very divaricated and could not be seen well. The fascia was then reapproximated with running sutures of 0Vicryl. The subcuticular closure was performed using 2-0plain gut. The skin was closed with 4-0Vicryl. PICO- negative pressure wound dressing placed and then honey-comb dressing applied.   Instrument, sponge, and needle counts were correct prior the abdominal closure and were correct at the conclusion of the case.  Patient brought to PACU stable, baby stayed with mother.    Findings: Female, born at 17.08 hrs with Apgars 9 and 9 and weight 8 lb 9 oz. Placenta normal, large, sent to path. Normal tubes and ovaries. Omental adhesions to under surface of fascia with no peritoneum and rectus belly seen well.  Estimated Blood Loss: 750 cc   Total IV Fluids: 1900 ml  LR  Urine Output: 100 CC OF clear urine  Specimens: Cord blood and placenta    Complications: no complications  Disposition: PACU - hemodynamically stable.   Maternal Condition: stable   Baby condition / location:  Couplet care / Skin to Skin  Attending Attestation: I performed the procedure.   Signed: Surgeon(s): Robley Fries, MD

## 2013-07-05 NOTE — H&P (Signed)
Ann Santos is a 33 y.o. female G2P1001 at 37.3 wks with gestational HTN who is admitted for repeat C-section as delivery is recommended by MFM for [facility] after 37 wks.  Patient BP is noted to be elevated since last week, she has h/o Preeclampsia. PIH labs 1 week back that were normal. She denies symptoms of HA/SOB/CP. She was seen by MFM for growth sono due to morbid obesity at regular intervals and was seen on 3/11 by Dr Melene Muller who recommended delivery. Patient desired repeat C/section.  Ob care - Rocco Pauls, started with CNM care and transferred to MD since high risk due to morbid obesity and was planning to have repeat C/s. She had nroaml anatomy sono with MFM f/by interval growth scans, EFW 99%, AC99%. Patient does not have GDM but has maternal obesity. Prior c/s was complicated by wound infection and separation needing wound vac.   History OB History   Grav Para Term Preterm Abortions TAB SAB Ect Mult Living   2 1 1       1      Past Medical History  Diagnosis Date  . Diverticulitis     Per pt. States she never had CT scan to confirm this  . Anxiety   . Diverticulosis     PT HOSPITALIZED AT [facility] October 03, 2011  . Umbilical hernia     planning surgical repair  . Constipation   . Abdominal distention   . Abdominal pain   . Blood in stool   . Rectal bleeding   . Hypertension     During pregnancy only  . Anxiety    Past Surgical History  Procedure Laterality Date  . Colonoscopy  10/07/2011    Procedure: COLONOSCOPY;  Surgeon: Theda Belfast, MD;  Location: [facility] ENDOSCOPY;  Service: Endoscopy;  Laterality: N/A;  . Esophagogastroduodenoscopy  10/07/2011    Procedure: ESOPHAGOGASTRODUODENOSCOPY (EGD);  Surgeon: Theda Belfast, MD;  Location: [facility];  Service: Endoscopy;  Laterality: N/A;  . Cesarean section  05/28/09  . Scar revision  09/17/09    c-section scar revision  . Umbilical hernia repair  11/01/2011    Procedure: HERNIA REPAIR UMBILICAL ADULT;  Surgeon: Velora Heckler,  MD;  Location: [facility] ORS;  Service: General;  Laterality: N/A;   Family History: family history includes Cancer in her maternal grandmother; Diabetes in her mother; Hypertension in her father and mother; Ulcerative colitis in her father. Social History:  reports that she has never smoked. She has never used smokeless tobacco. She reports that she drinks alcohol. She reports that she does not use illicit drugs.   Prenatal Transfer Tool  Maternal Diabetes: No Genetic Screening: Declined Maternal Ultrasounds/Referrals: Normal  Fetal Ultrasounds or other Referrals:  Other:  MFM sono with interval growth sono, macrosomia suspected Maternal Substance Abuse:  No Significant Maternal Medications:  Trazodone  Significant Maternal Lab Results:  Lab values include: Group B Strep negative Other Comments:  S/p TDaP., rubella equivocal  ROS  neg    Last menstrual period 10/16/2012. Exam Physical Exam :  A&O x 3, no acute distress. Pleasant HEENT neg, no thyromegaly Lungs CTA bilat CV RRR, S1S2 normal Abdo soft, non tender, non acute, gravid uterus, large abdominal pannus Extr no edema/ tenderness Pelvic cx closed, long FHT  130s Toco none reported   Prenatal labs: ABO, Rh:  A(+) Antibody:  neg Rubella:  equivocal RPR:   neg  HBsAg:   neg  HIV:   neg GBS:   neg  GLucola normal  Assessment/Plan: 33 yo G2P1 at 37.3 wks with gestational HTN since 36 wks, maternal obesity, prior C/s x1 with wound complications. Here for repeat C/s due to Gestational HTN.  Risks/complications of surgery reviewed incl infection, bleeding, damage to internal organs including bladder, bowels, ureters, blood vessels, other risks from anesthesia, VTE and delayed complications of any surgery, complications in future surgery reviewed. Also discussed neonatal complications incl difficult delivery, laceration, vacuum assistance, TTN etc. Pt understands and agrees, all concerns addressed.   Permanent sterilization  reviewed.    Yunis Voorheis R 07/05/2013, 2:31 PM

## 2013-07-05 NOTE — Anesthesia Procedure Notes (Signed)
Spinal  Patient location during procedure: OR Start time: 07/05/2013 4:49 PM Staffing Performed by: anesthesiologist  Preanesthetic Checklist Completed: patient identified, site marked, surgical consent, pre-op evaluation, timeout performed, IV checked, risks and benefits discussed and monitors and equipment checked Spinal Block Patient position: sitting Prep: site prepped and draped and DuraPrep Patient monitoring: blood pressure, continuous pulse ox and heart rate Approach: midline Location: L3-4 Injection technique: catheter Needle Needle type: Pencan and Tuohy  Needle gauge: 27 G Needle length: 12.7 cm Needle insertion depth: 10 cm Catheter type: closed end flexible Catheter size: 19 g Catheter at skin depth: 15 cm Assessment Sensory level: T4 Additional Notes CSE for morbid obesity, repeat C/S and h/o wound infection.  LOR at 10 cm at skin.  Pencan via tuohy, immediate return of clear free flow CSF, SAB dose given.  Pencan removed.  Saline 2 ml flushed, epidural cath threaded easily and secured at 15 cm at skin.  EPIDURAL CATHETER NOT TESTED.  Patient tolerated procedure well with no apparent complications.  Jasmine DecemberA. Cassidy, MD

## 2013-07-06 LAB — RPR: RPR Ser Ql: NONREACTIVE

## 2013-07-06 LAB — CBC
HCT: 32.1 % — ABNORMAL LOW (ref 36.0–46.0)
Hemoglobin: 10.6 g/dL — ABNORMAL LOW (ref 12.0–15.0)
MCH: 28.7 pg (ref 26.0–34.0)
MCHC: 33 g/dL (ref 30.0–36.0)
MCV: 87 fL (ref 78.0–100.0)
Platelets: 233 10*3/uL (ref 150–400)
RBC: 3.69 MIL/uL — ABNORMAL LOW (ref 3.87–5.11)
RDW: 14.2 % (ref 11.5–15.5)
WBC: 9.5 10*3/uL (ref 4.0–10.5)

## 2013-07-06 NOTE — Progress Notes (Signed)

## 2013-07-06 NOTE — Transfer of Care (Signed)
Immediate Anesthesia Transfer of Care Note  Patient: Ann Santos  Procedure(s) Performed: Procedure(s) with comments: REPEAT CESAREAN SECTION (With Special Wound Vac) (N/A) -  EDD: 07/23/13  Patient Location: PACU  Anesthesia Type:Spinal and Epidural  Level of Consciousness: awake, alert  and oriented  Airway & Oxygen Therapy: Patient Spontanous Breathing and Patient connected to nasal cannula oxygen  Post-op Assessment: Report given to PACU RN and Post -op Vital signs reviewed and stable  Post vital signs: Reviewed and stable  Complications: No apparent anesthesia complications

## 2013-07-06 NOTE — Progress Notes (Signed)
Patient ID: Ann Santos, female   DOB: December 23, 1980, 33 y.o.   MRN: [id number] POD # 1  Subjective: Pt reports feeling "ok".  Sore / Pain controlled with ibuprofen and percocet Denies HA, epigastric pain, SOB, CP, scotoma Tolerating po/ Foley d/c'ed approx an hour ago.  Has not voided / No n/v/Flatus pos Activity: up with assistance and currently up in chair Bleeding is light Newborn info:  Information for the patient's newborn:  Katerin, Negrete [[id number]]  female  / circ to be performed later today by Dr Benjie Karvonen / Feeding: breast   Objective: VS: Blood pressure 116/77, pulse 90, temperature 97.8 F (36.6 C), temperature source Oral, resp. rate 20,.    Intake/Output Summary (Last 24 hours) at 07/06/13 1044 Last data filed at 07/06/13 0450  Gross per 24 hour  Intake 2162.5 ml  Output    810 ml  Net 1352.5 ml      Recent Labs  07/05/13 1450 07/06/13 0600  WBC 11.4* 9.5  HGB 12.6 10.6*  HCT 37.5 32.1*  PLT 282 233    Blood type: A POS Rubella:   Indeterminate   Physical Exam:  General: alert, cooperative and no distress CV: Regular rate and rhythm Resp: clear Abdomen: mod dependent edema, soft, NT, hypoactive BS Incision: Covered with Tegaderm and honeycomb dressing; PICO neg pressure wound dressing in place Uterine Fundus: firm, below umbilicus, nontender Lochia: minimal Ext: edema +2 and Homans sign is negative, no sign of DVT    A/P: POD # 1/ G2P2002 S/P Repeat C/Section @ 37 wks d/t worsening gest HTN  Hx wound infection with previous C/S BP's stable post op without antHTN meds MMR prior to D/C home Doing well Continue routine post op orders   Signed: Darleen Crocker, MSN, [facility] 07/06/2013, 10:44 AM

## 2013-07-06 NOTE — Anesthesia Postprocedure Evaluation (Signed)
Anesthesia Post Note  Patient: Ann Santos  Procedure(s) Performed: Procedure(s) (LRB): REPEAT CESAREAN SECTION (With Special Wound Vac) (N/A)  Anesthesia type: Spinal  Patient location: Mother/Baby  Post pain: Pain level controlled  Post assessment: Post-op Vital signs reviewed  Last Vitals:  Filed Vitals:   07/06/13 0845  BP: 116/77  Pulse: 90  Temp: 36.6 C  Resp: 20    Post vital signs: Reviewed  Level of consciousness: awake  Complications: No apparent anesthesia complications

## 2013-07-07 MED ORDER — SENNOSIDES-DOCUSATE SODIUM 8.6-50 MG PO TABS
2.0000 | ORAL_TABLET | ORAL | Status: DC
Start: 2013-07-07 — End: 2014-08-12

## 2013-07-07 MED ORDER — OXYCODONE-ACETAMINOPHEN 5-325 MG PO TABS: 1.0000 | ORAL_TABLET | ORAL | Status: DC | PRN

## 2013-07-07 MED ORDER — IBUPROFEN 600 MG PO TABS: 600.0000 mg | ORAL_TABLET | Freq: Four times a day (QID) | ORAL | Status: DC

## 2013-07-07 NOTE — Progress Notes (Signed)
Patient ID: Ann Santos, female   DOB: 06/09/1980, 33 y.o.   MRN: [id number] POD # 2  Subjective: Pt reports feeling well and eager for d/c home / Pain controlled with ibuprofen and percocet Tolerating po/Voiding without problems/ No n/v/Flatus pos Activity: out of bed and ambulate Bleeding is light Newborn info:  Information for the patient's newborn:  Ann Santos, Boy Ann Santos [[id number]]  female  / circ performed this am per Dr Juliene PinaMody / Feeding: breast   Objective: VS: Blood pressure 141/75, pulse 94, temperature 97.6 F (36.4 C), temperature source Oral, resp. rate 19   LABS:  Recent Labs  07/05/13 1450 07/06/13 0600  WBC 11.4* 9.5  HGB 12.6 10.6*  PLT 282 233                             Physical Exam:  General: alert, cooperative and no distress CV: Regular rate and rhythm Resp: clear Abdomen: soft, nontender, normal bowel sounds Incision: closed with subcuticular closure and internal wound vac in place.  Covered with Tegaderm and honeycomb dressing; well approximated. Uterine Fundus: firm, below umbilicus, nontender Lochia: minimal Ext: edema +1 and Homans sign is negative, no sign of DVT    A/P: POD # 3/ G2P2002/ S/P Rpt C/Section d/t gestHTN and morbid obesity BP's stable pp; no medications needed at this point. Hx wound infection w/previous C/S.  Internal wound vac in place.  Return to office thurs 07/11/13 for evaluation and removal of internal vac ABL anemia; urged iron rich foods Doing well and stable for discharge home RX's: Ibuprofen 600mg  po Q 6 hrs prn pain #30 Refill x 1 Percocet 5/325 1 - 2 tabs po every 6 hrs prn pain  #50 No refill Colace 100mg  po up to TID prn #30 Ref x 1    Signed: Demetrius RevelFISHER,Chaston Bradburn K, MSN, [facility] 07/07/2013, 10:07 AM

## 2013-07-07 NOTE — Discharge Summary (Signed)
Reviewed and agree with note and plan. V.Alfons Sulkowski, MD  

## 2013-07-07 NOTE — Discharge Summary (Signed)
Obstetric Discharge Summary Reason for Admission: G2 P1 for repeat C/S @ 37wks, per recommendation of MFM for hx gest HTN and morbid obesity.    BP elevations since 36wks, with h/o preeclampsia.  PIH WNL 1 week ago.    Normal anatomy sono with MFM.  Interval growth scans, EFW 99%, AC99%. Patient does not have GDM but has maternal obesity.    Prior c/s was complicated by wound infection and separation needing wound vac.    Hx anxiety, stable on Zoloft    Ob care - [employer], started with CNM care and transferred to MD since high risk due to morbid obesity and was planning to have repeat C/s.    Prenatal Procedures: NST and ultrasound Intrapartum Procedures: cesarean: low cervical, transverse Postpartum Procedures: MMR Complications-Operative and Postpartum: none Hemoglobin  Date Value Ref Range Status  07/06/2013 10.6* 12.0 - 15.0 g/dL Final  01/30/2012 14.6  12.2 - 16.2 g/dL Final     HCT  Date Value Ref Range Status  07/06/2013 32.1* 36.0 - 46.0 % Final     HCT, POC  Date Value Ref Range Status  01/30/2012 45.5  37.7 - 47.9 % Final    Physical Exam:  General: alert, cooperative and no distress Lochia: appropriate Uterine Fundus: firm Incision: Covered with Tegaderm and honeycomb dressing; well approximated. Internal wound vac in place and intact DVT Evaluation: No evidence of DVT seen on physical exam. Negative Homan's sign.  Discharge Diagnoses: G2 P2 s/p repeat C/S @ 37wks d/t hx gestHTN and morbid obesity.    BPs have remained stable and WNL since delivery.  No antiHTN meds at this time.    Hx wound infection with previous C/S.  Internal wound vac in place, intact.    Hx Anxiety, stable  Discharge Information: Date: 07/07/2013 Activity: pelvic rest Diet: routine Medications: PNV, Ibuprofen, Colace and Percocet Condition: stable Instructions: refer to practice specific booklet Discharge to: home Follow-up Information   Follow up with MODY,VAISHALI R, MD In 4  days. (Incision evaluation and removal of internal wound VAC Thurs 07/11/13.)    Specialty:  Obstetrics and Gynecology   Contact information:   [place] [address] [phone]       Newborn Data: Live born female on 07/05/13 Birth Weight: 8 lb 9.2 oz (3890 g) APGAR: 9, 9  Home with mother.  Annise Boran K 07/07/2013, 10:04 AM

## 2013-07-08 ENCOUNTER — Encounter (HOSPITAL_COMMUNITY): Payer: Self-pay | Admitting: Obstetrics & Gynecology

## 2013-07-11 ENCOUNTER — Other Ambulatory Visit: Payer: Self-pay | Admitting: Family Medicine

## 2013-07-15 ENCOUNTER — Other Ambulatory Visit (HOSPITAL_COMMUNITY): Payer: BC Managed Care – PPO

## 2013-07-17 ENCOUNTER — Emergency Department (HOSPITAL_COMMUNITY): Payer: BC Managed Care – PPO

## 2013-07-17 ENCOUNTER — Encounter (HOSPITAL_COMMUNITY): Payer: Self-pay | Admitting: Emergency Medicine

## 2013-07-17 ENCOUNTER — Ambulatory Visit (INDEPENDENT_AMBULATORY_CARE_PROVIDER_SITE_OTHER): Payer: BC Managed Care – PPO | Admitting: Emergency Medicine

## 2013-07-17 ENCOUNTER — Emergency Department (HOSPITAL_COMMUNITY)
Admission: EM | Admit: 2013-07-17 | Discharge: 2013-07-17 | Disposition: A | Discharge status: Home / Self Care | Payer: BC Managed Care – PPO | Attending: Emergency Medicine | Admitting: Emergency Medicine

## 2013-07-17 VITALS — BP 138/88 | HR 86 | Temp 98.1°F | Resp 20 | Ht 62.0 in | Wt 293.0 lb

## 2013-07-17 DIAGNOSIS — R0602 Shortness of breath: Secondary | ICD-10-CM | POA: Insufficient documentation

## 2013-07-17 DIAGNOSIS — IMO0002 Reserved for concepts with insufficient information to code with codable children: Secondary | ICD-10-CM | POA: Insufficient documentation

## 2013-07-17 DIAGNOSIS — R079 Chest pain, unspecified: Secondary | ICD-10-CM

## 2013-07-17 DIAGNOSIS — F411 Generalized anxiety disorder: Secondary | ICD-10-CM | POA: Insufficient documentation

## 2013-07-17 DIAGNOSIS — R0789 Other chest pain: Secondary | ICD-10-CM | POA: Diagnosis present

## 2013-07-17 DIAGNOSIS — M549 Dorsalgia, unspecified: Secondary | ICD-10-CM | POA: Insufficient documentation

## 2013-07-17 DIAGNOSIS — I10 Essential (primary) hypertension: Secondary | ICD-10-CM | POA: Insufficient documentation

## 2013-07-17 DIAGNOSIS — Z79899 Other long term (current) drug therapy: Secondary | ICD-10-CM | POA: Insufficient documentation

## 2013-07-17 DIAGNOSIS — R51 Headache: Secondary | ICD-10-CM | POA: Insufficient documentation

## 2013-07-17 LAB — URINALYSIS, ROUTINE W REFLEX MICROSCOPIC
Bilirubin Urine: NEGATIVE
Glucose, UA: NEGATIVE mg/dL
Ketones, ur: NEGATIVE mg/dL
Nitrite: NEGATIVE
Protein, ur: NEGATIVE mg/dL
Specific Gravity, Urine: <1.005 — ABNORMAL LOW (ref 1.005–1.030)
Urobilinogen, UA: 0.2 mg/dL (ref 0.0–1.0)
pH: 5.5 (ref 5.0–8.0)

## 2013-07-17 LAB — CBC WITH DIFFERENTIAL/PLATELET
Basophils Absolute: 0 10*3/uL (ref 0.0–0.1)
Basophils Relative: 0 % (ref 0–1)
Eosinophils Absolute: 0.2 10*3/uL (ref 0.0–0.7)
Eosinophils Relative: 2 % (ref 0–5)
HCT: 41.2 % (ref 36.0–46.0)
Hemoglobin: 13.7 g/dL (ref 12.0–15.0)
Lymphocytes Relative: 29 % (ref 12–46)
Lymphs Abs: 2.7 10*3/uL (ref 0.7–4.0)
MCH: 28.8 pg (ref 26.0–34.0)
MCHC: 33.3 g/dL (ref 30.0–36.0)
MCV: 86.7 fL (ref 78.0–100.0)
Monocytes Absolute: 0.4 10*3/uL (ref 0.1–1.0)
Monocytes Relative: 5 % (ref 3–12)
Neutro Abs: 6 10*3/uL (ref 1.7–7.7)
Neutrophils Relative %: 64 % (ref 43–77)
Platelets: 396 10*3/uL (ref 150–400)
RBC: 4.75 MIL/uL (ref 3.87–5.11)
RDW: 13.9 % (ref 11.5–15.5)
WBC: 9.4 10*3/uL (ref 4.0–10.5)

## 2013-07-17 LAB — URINE MICROSCOPIC-ADD ON

## 2013-07-17 LAB — COMPREHENSIVE METABOLIC PANEL
ALT: 17 U/L (ref 0–35)
AST: 21 U/L (ref 0–37)
Albumin: 2.9 g/dL — ABNORMAL LOW (ref 3.5–5.2)
Alkaline Phosphatase: 91 U/L (ref 39–117)
BUN: 13 mg/dL (ref 6–23)
CO2: 24 mEq/L (ref 19–32)
Calcium: 9.2 mg/dL (ref 8.4–10.5)
Chloride: 101 mEq/L (ref 96–112)
Creatinine, Ser: 0.48 mg/dL — ABNORMAL LOW (ref 0.50–1.10)
GFR calc Af Amer: >90 mL/min (ref >90)
GFR calc non Af Amer: >90 mL/min (ref >90)
Glucose, Bld: 80 mg/dL (ref 70–99)
Potassium: 3.9 mEq/L (ref 3.7–5.3)
Sodium: 141 mEq/L (ref 137–147)
Total Bilirubin: 0.2 mg/dL — ABNORMAL LOW (ref 0.3–1.2)
Total Protein: 6.9 g/dL (ref 6.0–8.3)

## 2013-07-17 LAB — URIC ACID: Uric Acid, Serum: 6.7 mg/dL (ref 2.4–7.0)

## 2013-07-17 LAB — TROPONIN I: Troponin I: <0.3 ng/mL (ref <0.30)

## 2013-07-17 LAB — D-DIMER, QUANTITATIVE (NOT AT ARMC): D-Dimer, Quant: 2.88 ug/mL-FEU — ABNORMAL HIGH (ref 0.00–0.48)

## 2013-07-17 LAB — PROTEIN / CREATININE RATIO, URINE
Creatinine, Urine: 49.94 mg/dL
Protein Creatinine Ratio: 0.12 (ref 0.00–0.15)
Total Protein, Urine: 5.8 mg/dL

## 2013-07-17 MED ORDER — IOHEXOL 350 MG/ML SOLN
100.0000 mL | Freq: Once | INTRAVENOUS | Status: AC | PRN
  Administered 2013-07-17: 100 mL via INTRAVENOUS

## 2013-07-17 MED ORDER — HYDROMORPHONE HCL PF 1 MG/ML IJ SOLN
0.5000 mg | INTRAMUSCULAR | Status: AC
  Administered 2013-07-17: 0.5 mg via INTRAVENOUS
  Filled 2013-07-17: qty 1

## 2013-07-17 MED ORDER — SODIUM CHLORIDE 0.9 % IV BOLUS (SEPSIS)
500.0000 mL | Freq: Once | INTRAVENOUS | Status: AC
  Administered 2013-07-17: 500 mL via INTRAVENOUS

## 2013-07-17 NOTE — Progress Notes (Deleted)
Subjective:    Patient ID: Ann Santos, female    DOB: 11-Oct-1980, 33 y.o.   MRN: [id number]  HPI This chart was scribed for Viviann Spare Daub-MD, by Ladona Ridgel Chontel Warning, Scribe. This patient was seen in room 7 and the patient's care was started at 3:35 PM.  HPI Comments: Ann Santos is a 33 y.o. female who presents to the [facility] for pressure like left sided CP which began about 3 hours ago. She is s/p C-section delivery x2 weeks ago, no complications with delivery, pt stayed in the hospital for x2 nights. G2 P2 A0. She saw her OB/GYN last week for recheck of surgical incision which she states is fine but has been checking her BP recently and states in last trimester, her BP was becoming chronically elevated. She reports her CP today is pressure like and having pain w/deep breaths. She also c/o back pain and nausea. She denies any diaphoresis or SOB.   Past Medical History  Diagnosis Date  . Diverticulitis     Per pt. States she never had CT scan to confirm this  . Anxiety   . Diverticulosis     PT HOSPITALIZED AT [facility] October 03, 2011  . Umbilical hernia     planning surgical repair  . Constipation   . Abdominal distention   . Abdominal pain   . Blood in stool   . Rectal bleeding   . Hypertension     During pregnancy only  . Anxiety   . Gestational hypertension 07/05/2013  . Maternal obesity, antepartum 07/05/2013    No Known Allergies  No orders of the defined types were placed in this encounter.    Triage Vitals: BP 138/88  Pulse 86  Temp(Src) 98.1 F (36.7 C) (Oral)  Resp 20  Ht 5\' 2"  (1.575 m)  Wt 293 lb (132.904 kg)  BMI 53.58 kg/m2  SpO2 98%  LMP 10/16/2012  Review of Systems  Constitutional: Negative for fever and chills.  Respiratory: Negative for cough and shortness of breath.   Cardiovascular: Positive for chest pain.  Gastrointestinal: Positive for nausea. Negative for abdominal pain.  Musculoskeletal: Negative for back pain.        Objective:   Physical Exam Physical Exam  Nursing note and vitals reviewed. Constitutional: Patient is oriented to person, place, and time. Patient appears well-developed and well-nourished. No distress.  HENT:  Head: Normocephalic and atraumatic.  Neck: Neck supple. No tracheal deviation present.  Cardiovascular: Normal rate, regular rhythm and normal heart sounds.   No murmur heard. Pulmonary/Chest: Effort normal and breath sounds normal. No respiratory distress. Patient has no wheezes. Patient has no rales.  Musculoskeletal: Normal range of motion.  Neurological: Patient is alert and oriented to person, place, and time.  Skin: Skin is warm and dry.  Psychiatric: Patient has a normal mood and affect. Patient's behavior is normal.  EKG no acute changes    Assessment & Plan:  DIAGNOSTIC STUDIES: Oxygen Saturation is 98% on room air, normal by my interpretation.    COORDINATION OF CARE: At 330 PM Discussed treatment plan with patient which includes emergent EMS transport to ED for further evlauation, concerning high risk of clots. Patient agrees nitroglycerin was given prior to transfer to see if this changed her pain.   I personally performed the services described in this documentation, which was scribed in my presence. The recorded information has been reviewed and is accurate.   1 sublingual nitroglycerin at 3:35 p.m. 81mg  x  4 given at 3:35 pm

## 2013-07-17 NOTE — ED Notes (Signed)
MD at bedside. 

## 2013-07-17 NOTE — ED Notes (Signed)
Pt from [facility] via [facility] with c/o central chest pain starting at noon today.  Pt is 2 weeks postpartum.  Was started of HTN medication x 1 week ago.  Given 324 mg aspirin and 1 nitro.  Nitro did not help with her chest pain, but symptoms have resolved since leaving the UC.  Pt in NAD, A&O.

## 2013-07-17 NOTE — Discharge Instructions (Signed)
Chest Pain (Nonspecific) °Chest pain has many causes. Your pain could be caused by something serious, such as a heart attack or a blood clot in the lungs. It could also be caused by something less serious, such as a chest bruise or a virus. Follow up with your doctor. More lab tests or other studies may be needed to find the cause of your pain. Most of the time, nonspecific chest pain will improve within 2 to 3 days of rest and mild pain medicine. °HOME CARE °· For chest bruises, you may put ice on the sore area for 15-20 minutes, 03-04 times a day. Do this only if it makes you feel better. °· Put ice in a plastic bag. °· Place a towel between the skin and the bag. °· Rest for the next 2 to 3 days. °· Go back to work if the pain improves. °· See your doctor if the pain lasts longer than 1 to 2 weeks. °· Only take medicine as told by your doctor. °· Quit smoking if you smoke. °GET HELP RIGHT AWAY IF:  °· There is more pain or pain that spreads to the arm, neck, jaw, back, or belly (abdomen). °· You have shortness of breath. °· You cough more than usual or cough up blood. °· You have very bad back or belly pain, feel sick to your stomach (nauseous), or throw up (vomit). °· You have very bad weakness. °· You pass out (faint). °· You have a fever. °Any of these problems may be serious and may be an emergency. Do not wait to see if the problems will go away. Get medical help right away. Call your local emergency services 911 in [place] Do not drive yourself to the hospital. °MAKE SURE YOU:  °· Understand these instructions. °· Will watch this condition. °· Will get help right away if you or your child is not doing well or gets worse. °Document Released: 09/28/2007 Document Revised: 07/04/2011 Document Reviewed: 09/28/2007 °[employer]® Patient Information ©2014 [employer]. ° °

## 2013-07-17 NOTE — Progress Notes (Signed)
Subjective:    Patient ID: Ann Santos, female    DOB: 10/08/1980, 33 y.o.   MRN: [id number]  HPI  This chart was scribed for Viviann SpareSteven Demetrice Combes-MD, by Ladona Ridgelaylor Day, Scribe. This patient was seen in room 7 and the patient's care was started at 3:35 PM.  HPI Comments:   Ann Santos is a 33 y.o. female who presents [facility] the [facility] for pressure like left sided CP which began about 3 hours ago. She is s/p C-section delivery x2 weeks ago, no complications with delivery, pt stayed in the hospital for x2 nights. G2 P2 A0. She saw her OB/GYN last week for recheck of surgical incision which she states is fine but has been checking her BP recently and states in last trimester, her BP was becoming chronically elevated. She reports her CP today is pressure like and having pain w/deep breaths. She also c/o back pain and nausea. She denies any diaphoresis or SOB.   Past Medical History   Diagnosis  Date   .  Diverticulitis      Per pt. States she never had CT scan to confirm this   .  Anxiety    .  Diverticulosis      [facility]   .  Umbilical hernia      planning surgical repair   .  Constipation    .  Abdominal distention    .  Abdominal pain    .  Blood in stool    .  Rectal bleeding    .  Hypertension      During pregnancy only   .  Anxiety    .  Gestational hypertension  07/05/2013   .  Maternal obesity, antepartum  07/05/2013    No Known Allergies  No orders of the defined types were placed in this encounter.   Review of Systems Triage Vitals: BP 138/88  Pulse 86  Temp(Src) 98.1 F (36.7 C) (Oral)  Resp 20  Ht 5\' 2"  (1.575 m)  Wt 293 lb (132.904 kg)  BMI 53.58 kg/m2  SpO2 98%  LMP 10/16/2012  Review of Systems  Constitutional: Negative for fever and chills.  Respiratory: Negative for cough and shortness of breath.  Cardiovascular: Positive for chest pain.  Gastrointestinal: Positive for nausea. Negative for abdominal pain.    Musculoskeletal: Negative for back pain.      Objective:   Physical Exam  Nursing note and vitals reviewed.  Constitutional: Patient is oriented to person, place, and time. Patient appears well-developed and well-nourished. No distress.  HENT:  Head: Normocephalic and atraumatic.  Neck: Neck supple. No tracheal deviation present.  Cardiovascular: Normal rate, regular rhythm and normal heart sounds.  No murmur heard.  Pulmonary/Chest: Effort normal and breath sounds normal. No respiratory distress. Patient has no wheezes. Patient has no rales.  Musculoskeletal: Normal range of motion.  Neurological: Patient is alert and oriented to person, place, and time.  Skin: Skin is warm and dry.  Psychiatric: Patient has a normal mood and affect. Patient's behavior is normal.  EKG no acute changes      Assessment & Plan:  DIAGNOSTIC STUDIES:  Oxygen Saturation is 98% on room air, normal by my interpretation.   COORDINATION OF CARE:   At 330 PM Discussed treatment plan with patient which includes emergent EMS transport to ED for further evlauation, concerning high risk of clots. Patient agrees nitroglycerin was given prior to transfer to see  if this changed her pain.   I personally performed the services described in this documentation, which was scribed in my presence. The recorded information has been reviewed and is accurate.   1 sublingual nitroglycerin at 3:35 p.m. 81mg  x 4 given at 3:35 pm

## 2013-07-17 NOTE — ED Provider Notes (Signed)
CSN: [id number]     Arrival date & time 07/17/13  1604 History   First MD Initiated Contact with Patient 07/17/13 1605     Chief Complaint  Patient presents with  . Chest Pain  . Shortness of Breath     (Consider location/radiation/quality/duration/timing/severity/associated sxs/prior Treatment) Patient is a 33 y.o. female presenting with chest pain and shortness of breath. The history is provided by the patient.  Chest Pain Chest pain location: initially across the chest, now in the left chest. Pain quality: pressure   Pain radiates to:  Does not radiate Pain radiates to the back: no   Pain severity:  Mild Onset quality:  Sudden Duration:  5 hours Timing:  Intermittent Progression:  Waxing and waning Chronicity:  New Context: breathing and at rest   Relieved by:  Nothing Worsened by:  Nothing tried Ineffective treatments: asa and nitro at UC. Associated symptoms: back pain, headache and shortness of breath   Associated symptoms: no abdominal pain, no cough, no dizziness, no fatigue, no fever, no nausea and not vomiting   Shortness of breath:    Severity:  Mild   Onset quality:  Sudden   Duration:  5 hours   Timing:  Constant   Progression:  Unchanged Shortness of Breath Associated symptoms: chest pain and headaches   Associated symptoms: no abdominal pain, no cough, no fever, no neck pain and no vomiting     Past Medical History  Diagnosis Date  . Diverticulitis     Per pt. States she never had CT scan to confirm this  . Anxiety   . Diverticulosis     PT HOSPITALIZED AT [facility] October 03, 2011  . Umbilical hernia     planning surgical repair  . Constipation   . Abdominal distention   . Abdominal pain   . Blood in stool   . Rectal bleeding   . Hypertension     During pregnancy only  . Anxiety   . Gestational hypertension 07/05/2013  . Maternal obesity, antepartum 07/05/2013   Past Surgical History  Procedure Laterality Date  . Colonoscopy  10/07/2011     Procedure: COLONOSCOPY;  Surgeon: Theda Belfast, MD;  Location: [facility] ENDOSCOPY;  Service: Endoscopy;  Laterality: N/A;  . Esophagogastroduodenoscopy  10/07/2011    Procedure: ESOPHAGOGASTRODUODENOSCOPY (EGD);  Surgeon: Theda Belfast, MD;  Location: [facility];  Service: Endoscopy;  Laterality: N/A;  . Cesarean section  05/28/09  . Scar revision  09/17/09    c-section scar revision  . Umbilical hernia repair  11/01/2011    Procedure: HERNIA REPAIR UMBILICAL ADULT;  Surgeon: Velora Heckler, MD;  Location: [facility] ORS;  Service: General;  Laterality: N/A;  . Cesarean section N/A 07/05/2013    Procedure: REPEAT CESAREAN SECTION (With Special Wound Vac);  Surgeon: Robley Fries, MD;  Location: [facility] ORS;  Service: Obstetrics;  Laterality: N/A;   EDD: 07/23/13   Family History  Problem Relation Age of Onset  . Diabetes Mother   . Hypertension Mother   . Ulcerative colitis Father   . Hypertension Father   . Cancer Maternal Grandmother     lung   History  Substance Use Topics  . Smoking status: Never Smoker   . Smokeless tobacco: Never Used  . Alcohol Use: 0.0 oz/week     Comment: twice a month   OB History   Grav Para Term Preterm Abortions TAB SAB Ect Mult Living   2 2 2        2  Review of Systems  Constitutional: Negative for fever and fatigue.       Elevated BP's  HENT: Negative for congestion and drooling.   Eyes: Negative for pain.  Respiratory: Positive for shortness of breath. Negative for cough.   Cardiovascular: Positive for chest pain.  Gastrointestinal: Negative for nausea, vomiting, abdominal pain and diarrhea.  Genitourinary: Negative for dysuria and hematuria.  Musculoskeletal: Positive for back pain. Negative for gait problem and neck pain.  Skin: Negative for color change.  Neurological: Positive for headaches. Negative for dizziness.  Hematological: Negative for adenopathy.  Psychiatric/Behavioral: Negative for behavioral problems.  All other systems reviewed and are  negative.      Allergies  Review of patient's allergies indicates no known allergies.  Home Medications   Current Outpatient Rx  Name  Route  Sig  Dispense  Refill  . acetaminophen (TYLENOL) 500 MG tablet   Oral   Take 500 mg by mouth every 6 (six) hours as needed for moderate pain.         . calcium carbonate (TUMS EX) 750 MG chewable tablet   Oral   Chew 2 tablets by mouth 2 (two) times daily as needed for heartburn.         . Docosanol (ABREVA) 10 % CREA   Apply externally   Apply 1 application topically 2 (two) times daily as needed.         . fluticasone (FLONASE) 50 MCG/ACT nasal spray   Each Nare   Place 1 spray into both nostrils daily as needed for allergies or rhinitis.         Marland Kitchen. ibuprofen (ADVIL,MOTRIN) 600 MG tablet   Oral   Take 1 tablet (600 mg total) by mouth every 6 (six) hours.   30 tablet   1   . oxyCODONE-acetaminophen (PERCOCET/ROXICET) 5-325 MG per tablet   Oral   Take 1-2 tablets by mouth every 4 (four) hours as needed for severe pain (moderate - severe pain).   50 tablet   0   . Prenatal Vit-Fe Fumarate-FA (MULTIVITAMIN-PRENATAL) 27-0.8 MG TABS tablet   Oral   Take 1 tablet by mouth daily at 12 noon.         . Probiotic Product (PROBIOTIC COLON SUPPORT PO)   Oral   Take 1 tablet by mouth daily.         Marland Kitchen. senna-docusate (SENOKOT-S) 8.6-50 MG per tablet   Oral   Take 2 tablets by mouth daily.   60 tablet   1   . sertraline (ZOLOFT) 100 MG tablet   Oral   Take 1 tablet (100 mg total) by mouth daily. PATIENT NEEDS CHECK UP FOR ADDITIONAL REFILLS   30 tablet   0   . sertraline (ZOLOFT) 50 MG tablet   Oral   Take 50 mg by mouth at bedtime.         . traZODone (DESYREL) 100 MG tablet   Oral   Take 100 mg by mouth at bedtime.          SpO2 99%  LMP 10/16/2012 Physical Exam  Nursing note and vitals reviewed. Constitutional: She is oriented to person, place, and time. She appears well-developed and well-nourished.   HENT:  Head: Normocephalic and atraumatic.  Mouth/Throat: Oropharynx is clear and moist. No oropharyngeal exudate.  Eyes: Conjunctivae and EOM are normal. Pupils are equal, round, and reactive to light.  Neck: Normal range of motion. Neck supple.  Cardiovascular: Normal rate, regular rhythm, normal heart sounds and  intact distal pulses.  Exam reveals no gallop and no friction rub.   No murmur heard. Pulmonary/Chest: Effort normal and breath sounds normal. No respiratory distress. She has no wheezes.  Abdominal: Soft. Bowel sounds are normal. There is no tenderness. There is no rebound and no guarding.  Musculoskeletal: Normal range of motion. She exhibits no edema and no tenderness.  No tenderness to palpation of the back.  Normal appearing and symmetric lower extremities without tenderness to palpation.  2+ distal pulses.  Neurological: She is alert and oriented to person, place, and time.  Reflex Scores:      Tricep reflexes are 2+ on the right side and 2+ on the left side.      Bicep reflexes are 2+ on the right side and 2+ on the left side.      Brachioradialis reflexes are 2+ on the right side and 2+ on the left side.      Patellar reflexes are 2+ on the right side and 2+ on the left side.      Achilles reflexes are 2+ on the right side and 2+ on the left side. No hyper-reflexia noted.   Skin: Skin is warm and dry.  Psychiatric: She has a normal mood and affect. Her behavior is normal.    ED Course  Procedures (including critical care time) Labs Review Labs Reviewed  COMPREHENSIVE METABOLIC PANEL - Abnormal; Notable for the following:    Creatinine, Ser 0.48 (*)    Albumin 2.9 (*)    Total Bilirubin 0.2 (*)    All other components within normal limits  D-DIMER, QUANTITATIVE - Abnormal; Notable for the following:    D-Dimer, Quant 2.88 (*)    All other components within normal limits  URINALYSIS, ROUTINE W REFLEX MICROSCOPIC - Abnormal; Notable for the following:     Specific Gravity, Urine <1.005 (*)    Hgb urine dipstick LARGE (*)    Leukocytes, UA SMALL (*)    All other components within normal limits  CBC WITH DIFFERENTIAL  TROPONIN I  URIC ACID  PROTEIN / CREATININE RATIO, URINE  URINE MICROSCOPIC-ADD ON   Imaging Review Dg Chest 2 View  07/17/2013   CLINICAL DATA:  Chest pain and shortness of breath  EXAM: CHEST  2 VIEW  COMPARISON:  01/10/2012  FINDINGS: The heart size and mediastinal contours are within normal limits. Both lungs are clear. The visualized skeletal structures are unremarkable.  IMPRESSION: No active cardiopulmonary disease.   Electronically Signed   By: Alcide Clever M.D.   On: 07/17/2013 17:59   Ct Angio Chest Pe W/cm &/or Wo Cm  07/17/2013   CLINICAL DATA:  Central chest pain 2 weeks postpartum. History of hypertension. Question pulmonary embolism.  EXAM: CT ANGIOGRAPHY CHEST WITH CONTRAST  TECHNIQUE: Multidetector CT imaging of the chest was performed using the standard protocol during bolus administration of intravenous contrast. Multiplanar CT image reconstructions and MIPs were obtained to evaluate the vascular anatomy.  CONTRAST:  OMNIPAQUE IOHEXOL 350 MG/ML SOLN  COMPARISON:  DG CHEST 2 VIEW dated 07/17/2013; DG CHEST 2V dated 01/10/2012; CT ABD/PELVIS W CM dated 06/18/2012  FINDINGS: The pulmonary arteries are inadequately but not optimally opacified with contrast. No acute pulmonary emboli are demonstrated. The thoracic aorta and great vessels appear normal. There are no enlarged mediastinal, hilar or axillary lymph nodes.  There is mild asymmetric enlargement of the left thyroid lobe without significant tracheal deviation or apparent focal abnormality. There is no pleural or pericardial effusion.  The lungs are clear.  The visualized upper abdomen appears unremarkable. There is a thoracic scoliosis convex to the right. No acute osseous findings are evident.  Review of the MIP images confirms the above findings.  IMPRESSION: 1.  No evidence of acute pulmonary embolism or other acute chest process. 2. Mild asymmetric enlargement of the left thyroid lobe without apparent focal nodularity. 3. Mild thoracic scoliosis.   Electronically Signed   By: Roxy Horseman M.D.   On: 07/17/2013 20:07     EKG Interpretation   Date/Time:  Wednesday July 17 2013 16:13:16 EDT Ventricular Rate:  76 PR Interval:  142 QRS Duration: 105 QT Interval:  394 QTC Calculation: 443 R Axis:   75 Text Interpretation:  Sinus rhythm Confirmed by Aswad Wandrey  MD, Ashely Joshua  ([id number]) on 07/17/2013 4:17:50 PM      MDM   Final diagnoses:  Atypical chest pain    4:52 PM 33 y.o. female G2P2 s/p scheduled C-section (37 weeks) on March 13 who presents with chest pain and shortness of breath. She notes that her symptoms began after eating lunch today around name. She states that her symptoms have waxed and waned. She currently has 4/10 chest pain on exam. She describes as a pressure and has pain with inspiration. She notes that she had mildly high blood pressures near the end of her pregnancy and continued to have some postpartum. She states that her OB/GYN started her on blood pressure medicine last Wednesday. She states that despite starting this medicine she has continued to have elevated blood pressures in the 160s over 100s. She states that she awoke with a 6/10 headache this morning consistent with previous headaches. She does have a history of headaches. She states that the headache resolved on sun and currently denies any headache. She also complains of some low back pain which she noticed this morning which has persisted. She is afebrile and vital signs are unremarkable here. Low risk per Well's PE criteria. Will get labs, pain control, d-dimer.   D-dimer elevated. Ordered CTA.   CTA neg. Discussed case w/ on call provider for [facility]. She recommends adding on uric acid and prot/cr ratio. Will order.   Labs resulted. Discussed w/ on call provider  w/ [facility] who recommends d/c home and they will call to f/u w/ pt.   11:27 PM: Pt remains asx and appears well on exam. Low risk for MACE per HEART score.  I have discussed the diagnosis/risks/treatment options with the patient and believe the pt to be eligible for discharge home to follow-up with obgyn as scheduled. We also discussed returning to the ED immediately if new or worsening sx occur. We discussed the sx which are most concerning (e.g., return of sob/cp, fever) that necessitate immediate return. Medications administered to the patient during their visit and any new prescriptions provided to the patient are listed below.  Medications given during this visit Medications  HYDROmorphone (DILAUDID) injection 0.5 mg (0.5 mg Intravenous Given 07/17/13 1728)  sodium chloride 0.9 % bolus 500 mL (0 mLs Intravenous Stopped 07/17/13 1933)  iohexol (OMNIPAQUE) 350 MG/ML injection 100 mL (100 mLs Intravenous Contrast Given 07/17/13 1950)    New Prescriptions   No medications on file     Junius Argyle, MD 07/18/13 [phone]

## 2013-07-26 ENCOUNTER — Other Ambulatory Visit: Payer: Self-pay | Admitting: Family Medicine

## 2013-07-26 NOTE — Telephone Encounter (Signed)
I called the pharmacy at 6:23 pm and waited to speak w/ a pharmacist for 5 minutes. Message line was not accepting call-ins. Will try again tomorrow.

## 2013-07-26 NOTE — Telephone Encounter (Signed)
Dr Audria NineMcPherson it looks like you were the last provider to see pt for this med but has been since 10/2012.

## 2013-07-26 NOTE — Telephone Encounter (Signed)
I attempted to phone in this medication again at 7:40 pm and was not able to leave a message nor speak w/ pharmacy staff. I will contact pt to find out if Tramadol refill can be called to a different pharmacy.  I called [facility]ill to [facility] [address] and left pt a message letting her know about the [facility] line difficulties [facility] [address].

## 2013-08-09 ENCOUNTER — Ambulatory Visit (INDEPENDENT_AMBULATORY_CARE_PROVIDER_SITE_OTHER): Payer: BC Managed Care – PPO | Admitting: Physician Assistant

## 2013-08-09 VITALS — BP 126/78 | HR 93 | Temp 98.9°F | Resp 16 | Ht 62.0 in | Wt 288.0 lb

## 2013-08-09 DIAGNOSIS — R5381 Other malaise: Secondary | ICD-10-CM

## 2013-08-09 DIAGNOSIS — J029 Acute pharyngitis, unspecified: Secondary | ICD-10-CM

## 2013-08-09 DIAGNOSIS — R5383 Other fatigue: Secondary | ICD-10-CM

## 2013-08-09 DIAGNOSIS — Z79899 Other long term (current) drug therapy: Secondary | ICD-10-CM

## 2013-08-09 DIAGNOSIS — G4700 Insomnia, unspecified: Secondary | ICD-10-CM

## 2013-08-09 DIAGNOSIS — J329 Chronic sinusitis, unspecified: Secondary | ICD-10-CM

## 2013-08-09 LAB — POCT CBC
Granulocyte percent: 80.3 %G — AB (ref 37–80)
HCT, POC: 44.5 % (ref 37.7–47.9)
Hemoglobin: 14.4 g/dL (ref 12.2–16.2)
Lymph, poc: 1.3 (ref 0.6–3.4)
MCH, POC: 28.6 pg (ref 27–31.2)
MCHC: 32.4 g/dL (ref 31.8–35.4)
MCV: 88.4 fL (ref 80–97)
MID (cbc): 0.4 (ref 0–0.9)
MPV: 7.4 fL (ref 0–99.8)
POC Granulocyte: 7.1 — AB (ref 2–6.9)
POC LYMPH PERCENT: 14.8 %L (ref 10–50)
POC MID %: 4.9 %M (ref 0–12)
Platelet Count, POC: 352 10*3/uL (ref 142–424)
RBC: 5.03 M/uL (ref 4.04–5.48)
RDW, POC: 14.5 %
WBC: 8.8 10*3/uL (ref 4.6–10.2)

## 2013-08-09 LAB — POCT INFLUENZA A/B
Influenza A, POC: NEGATIVE
Influenza B, POC: NEGATIVE

## 2013-08-09 LAB — POCT RAPID STREP A (OFFICE): Rapid Strep A Screen: NEGATIVE

## 2013-08-09 MED ORDER — AMOXICILLIN-POT CLAVULANATE 875-125 MG PO TABS: 1.0000 | ORAL_TABLET | Freq: Two times a day (BID) | ORAL | Status: DC

## 2013-08-09 MED ORDER — FLUCONAZOLE 150 MG PO TABS: 150.0000 mg | ORAL_TABLET | Freq: Once | ORAL | Status: DC

## 2013-08-09 MED ORDER — IPRATROPIUM BROMIDE 0.06 % NA SOLN: 2.0000 | Freq: Three times a day (TID) | NASAL | Status: DC

## 2013-08-09 MED ORDER — TRAZODONE HCL 50 MG PO TABS: 100.0000 mg | ORAL_TABLET | Freq: Every day | ORAL | Status: DC

## 2013-08-09 NOTE — Progress Notes (Signed)
Subjective:    Patient ID: Ann Santos, female    DOB: 02/21/1981, 33 y.o.   MRN: [id number]  HPI Primary Physician: Elvina SidleLAUENSTEIN,KURT, MD  Chief Complaint: ST x 5 days  HPI: 33 y.o. female with history below presents with "I feel like I've been hit by a truck." 5 day history of ST, bilateral otalgia, mild rhinorrhea, sinus pressure, nasal congestion, and sinus headache. Some chills. No fever. No cough. Last night was the worst her symptoms have been.   Has a newborn and has been at home with him. Daughter goes to a pre-K and she went to an open house this week. Husband also has a sore throat. She has tried Zyrtec and phenylephrine. Currently breast feeding at home.  She also asks for a refill of her Trazodone. She reports being on this for her entire pregnancy. She would like to decrease the mg from 100 mg to 50 mg. States her OB is aware she takes this. Has follow up with her OB next Thursday.    Past Medical History  Diagnosis Date  . Diverticulitis     Per pt. States she never had CT scan to confirm this  . Anxiety   . Diverticulosis     [facility], 2011  . Umbilical hernia     planning surgical repair  . Constipation   . Abdominal distention   . Abdominal pain   . Blood in stool   . Rectal bleeding   . Hypertension     During pregnancy only  . Anxiety   . Gestational hypertension 07/05/2013  . Maternal obesity, antepartum 07/05/2013     Home Meds: Prior to Admission medications   Medication Sig Start Date End Date Taking? Authorizing Provider  acetaminophen (TYLENOL) 500 MG tablet Take 500 mg by mouth every 6 (six) hours as needed for moderate pain.   Yes Historical Provider, MD  fluticasone (FLONASE) 50 MCG/ACT nasal spray Place 1 spray into both nostrils daily as needed for allergies or rhinitis.   Yes Historical Provider, MD  ibuprofen (ADVIL,MOTRIN) 600 MG tablet Take 1 tablet (600 mg total) by mouth every 6 (six) hours. 07/07/13  Yes Demetrius RevelJulie K  Fisher, NP  NIFEdipine (PROCARDIA-XL/ADALAT-CC/NIFEDICAL-XL) 30 MG 24 hr tablet Take 30 mg by mouth daily.   Yes Historical Provider, MD  Prenatal Vit-Fe Fumarate-FA (MULTIVITAMIN-PRENATAL) 27-0.8 MG TABS tablet Take 1 tablet by mouth daily at 12 noon.   Yes Historical Provider, MD  Probiotic Product (PROBIOTIC COLON SUPPORT PO) Take 1 tablet by mouth daily.   Yes Historical Provider, MD  senna-docusate (SENOKOT-S) 8.6-50 MG per tablet Take 2 tablets by mouth daily. 07/07/13  Yes Demetrius RevelJulie K Fisher, NP  sertraline (ZOLOFT) 50 MG tablet Take 50 mg by mouth at bedtime.   Yes Historical Provider, MD  traMADol (ULTRAM) 50 MG tablet TAKE 1 TABLET EVERY 8 HOURS AS NEEDED 07/26/13  Yes Maurice MarchBarbara B McPherson, MD  traZODone (DESYREL) 100 MG tablet Take 100 mg by mouth at bedtime.   Yes Historical Provider, MD    Allergies: No Known Allergies  History   Social History  . Marital Status: Married    Spouse Name: N/A    Number of Children: N/A  . Years of Education: N/A   Occupational History  . Not on file.   Social History Main Topics  . Smoking status: Never Smoker   . Smokeless tobacco: Never Used  . Alcohol Use: 0.0 oz/week     Comment: twice  a month  . Drug Use: No  . Sexual Activity: Yes   Other Topics Concern  . Not on file   Social History Narrative  . No narrative on file     Review of Systems  Constitutional: Positive for chills, appetite change and fatigue. Negative for fever.       Night sweats.   HENT: Positive for congestion, ear pain, postnasal drip, rhinorrhea, sinus pressure and sore throat. Negative for hearing loss and sneezing.        Nasal congestion.  Bilateral otalgia. Post nasal drip increased in the morning.    Respiratory: Negative for cough, shortness of breath and wheezing.   Gastrointestinal: Negative for nausea, vomiting and diarrhea.  Musculoskeletal: Positive for myalgias and neck pain. Negative for neck stiffness.  Skin: Negative for rash.  Neurological:  Positive for headaches.       Sinus headache.        Objective:   Physical Exam  Physical Exam: Blood pressure 126/78, pulse 93, temperature 98.9 F (37.2 C), temperature source Oral, resp. rate 16, height 5\' 2"  (1.575 m), weight 288 lb (130.636 kg), SpO2 97.00%, unknown if currently breastfeeding., Body mass index is 52.66 kg/(m^2). General: Well developed, well nourished, in no acute distress. Not toxic appearing.  Head: Normocephalic, atraumatic, eyes without discharge, sclera non-icteric, nares are dry and without discharge. Bilateral auditory canals clear, TM's are without perforation, pearly grey and translucent with reflective cone of light bilaterally. Serous effusion behind right TM. Oral cavity moist, posterior pharynx erythematous with post nasal drip. No exudate or peritonsillar abscess. Uvula midline.   Neck: Supple. No thyromegaly. Full ROM. No lymphadenopathy. No nuchal rigidity.  Lungs: Clear bilaterally to auscultation without wheezes, rales, or rhonchi. Breathing is unlabored. Heart: RRR with S1 S2. No murmurs, rubs, or gallops appreciated. Msk:  Strength and tone normal for age. Extremities/Skin: Warm and dry. No clubbing or cyanosis. No edema. No rashes or suspicious lesions. Neuro: Alert and oriented X 3. Moves all extremities spontaneously. Gait is normal. CNII-XII grossly in tact. Psych:  Responds to questions appropriately with a normal affect.   Labs: Results for orders placed in visit on 08/09/13  POCT CBC      Result Value Ref Range   WBC 8.8  4.6 - 10.2 K/uL   Lymph, poc 1.3  0.6 - 3.4   POC LYMPH PERCENT 14.8  10 - 50 %L   MID (cbc) 0.4  0 - 0.9   POC MID % 4.9  0 - 12 %M   POC Granulocyte 7.1 (*) 2 - 6.9   Granulocyte percent 80.3 (*) 37 - 80 %G   RBC 5.03  4.04 - 5.48 M/uL   Hemoglobin 14.4  12.2 - 16.2 g/dL   HCT, POC 16.144.5  09.637.7 - 47.9 %   MCV 88.4  80 - 97 fL   MCH, POC 28.6  27 - 31.2 pg   MCHC 32.4  31.8 - 35.4 g/dL   RDW, POC 04.514.5      Platelet Count, POC 352  142 - 424 K/uL   MPV 7.4  0 - 99.8 fL  POCT INFLUENZA A/B      Result Value Ref Range   Influenza A, POC Negative     Influenza B, POC Negative    POCT RAPID STREP A (OFFICE)      Result Value Ref Range   Rapid Strep A Screen Negative  Negative    Throat culture pending     Assessment &  Plan:  33 year old female with sinusitis, serous otitis media, pharyngitis, myalgias, fatigue, lactation, and insomnia  1) Sinusitis/serous otitis/pharyngitis/myalgias/fatigue  -Augmentin 875/125 mg 1 po bid #20 no RF -Atrovent NS 0.06% 2 sprays each nare bid prn #1 no RF -Diflucan 1 po if needed #2 RF 1 -Rest/fluids -RTC precautions  2) Lactation -Medication risks discussed -Consider pump and dump   3) Insomnia -Trazodone 50 mg 1 po qhs #30 RF 2 -Patient reports OB is aware she is taking this -"You can even see my prior Rx" -She reports being on this her entire pregnancy under the knowledge of her OB   Eula Listen, MHS, PA-C [facility] [address] [place], [place] [address] [phone] [facility] 08/09/2013 8:39 AM

## 2013-08-11 LAB — CULTURE, GROUP A STREP: Organism ID, Bacteria: NORMAL

## 2013-10-04 ENCOUNTER — Other Ambulatory Visit: Payer: Self-pay | Admitting: Family Medicine

## 2013-10-20 ENCOUNTER — Other Ambulatory Visit: Payer: Self-pay | Admitting: Physician Assistant

## 2013-10-20 ENCOUNTER — Other Ambulatory Visit: Payer: Self-pay | Admitting: Family Medicine

## 2013-10-21 NOTE — Telephone Encounter (Signed)
Spoke to pt- transferred to make an appt.

## 2013-10-23 ENCOUNTER — Ambulatory Visit (INDEPENDENT_AMBULATORY_CARE_PROVIDER_SITE_OTHER): Payer: BC Managed Care – PPO | Admitting: Family Medicine

## 2013-10-23 ENCOUNTER — Encounter: Payer: Self-pay | Admitting: Family Medicine

## 2013-10-23 VITALS — BP 112/70 | HR 76 | Temp 98.2°F | Resp 16 | Wt 291.0 lb

## 2013-10-23 DIAGNOSIS — F411 Generalized anxiety disorder: Secondary | ICD-10-CM

## 2013-10-23 DIAGNOSIS — F419 Anxiety disorder, unspecified: Secondary | ICD-10-CM

## 2013-10-23 DIAGNOSIS — G4700 Insomnia, unspecified: Secondary | ICD-10-CM

## 2013-10-23 MED ORDER — SERTRALINE HCL 50 MG PO TABS: 75.0000 mg | ORAL_TABLET | Freq: Every day | ORAL | Status: DC

## 2013-10-23 MED ORDER — TRAZODONE HCL 50 MG PO TABS: 100.0000 mg | ORAL_TABLET | Freq: Every day | ORAL | Status: DC

## 2013-10-25 NOTE — Progress Notes (Signed)
S: This 33 y.o. Cauc female is her for medication refills. She has chronic depression treated w/ sertraline which she has been taking throughout her pregnancy. She is nursing an infant who is 243 1/2 months old. Pt also has insomnia and takes trazodone as needed. She denies adverse medication effects and has no thoughts of self harm or SI/HI. Her husband is in a treatment program for substance abuse; he has taken her Tramadol in the past. She does not need that medication as present.  Patient Active Problem List   Diagnosis Date Noted  . Atypical chest pain 07/17/2013  . Gestational hypertension 07/05/2013  . Maternal obesity, antepartum 07/05/2013  . Postpartum care following cesarean delivery (3/13) 07/05/2013  . S/P cesarean section 07/05/2013  . Diverticular disease 10/23/2012  . Umbilical hernia 10/26/2011  . Anxiety 07/26/2011    PMHx, Surg Hx, Soc and Fam Hx reviewed.  ROS; Noncontributory.  O: Filed Vitals:   10/23/13 1206  BP: 112/70  Pulse: 76  Temp: 98.2 F (36.8 C)  Resp: 16   GEN: In NAD; WN,WD HENT: [place]/AT; EOMI w/ clear conj/sclerae. Otherwise normal. COR; RRR. LUNGS: Unlabored resp. SKIN: W&D; intact w/o diaphoresis or paloor. NEURO: A&O x 3; CNs intact. Nonfocal. PSYCH: Pleasant and calm; attentive w/ normal speech and thought pattern. Judgement sound.  A/P: Anxiety- Continue sertraline at current dose.  Insomnia - Plan: traZODone (DESYREL) 50 MG tablet  Meds ordered this encounter  Medications  . traZODone (DESYREL) 50 MG tablet    Sig: Take 2 tablets (100 mg total) by mouth at bedtime.    Dispense:  30 tablet    Refill:  5  . sertraline (ZOLOFT) 50 MG tablet    Sig: Take 1.5 tablets (75 mg total) by mouth at bedtime.    Dispense:  45 tablet    Refill:  5

## 2013-11-25 ENCOUNTER — Other Ambulatory Visit: Payer: Self-pay | Admitting: Family Medicine

## 2014-02-24 ENCOUNTER — Encounter: Payer: Self-pay | Admitting: Family Medicine

## 2014-02-26 IMAGING — US US OB DETAIL+14 WK
1 series · 12 of 28 positions shown · non-contrast
Comparison: none

[Series 1: us ob detail+14 wk · 0.25mm/px · 12 of 80 slices shown]
[im 3/80]
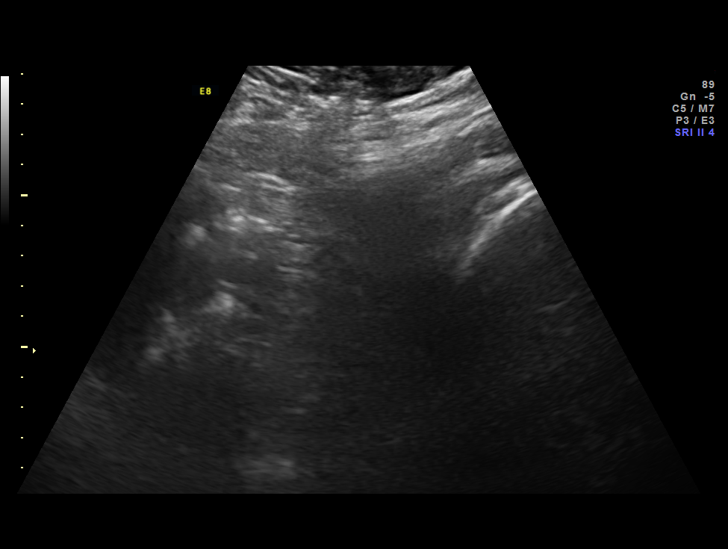
[im 9/80]
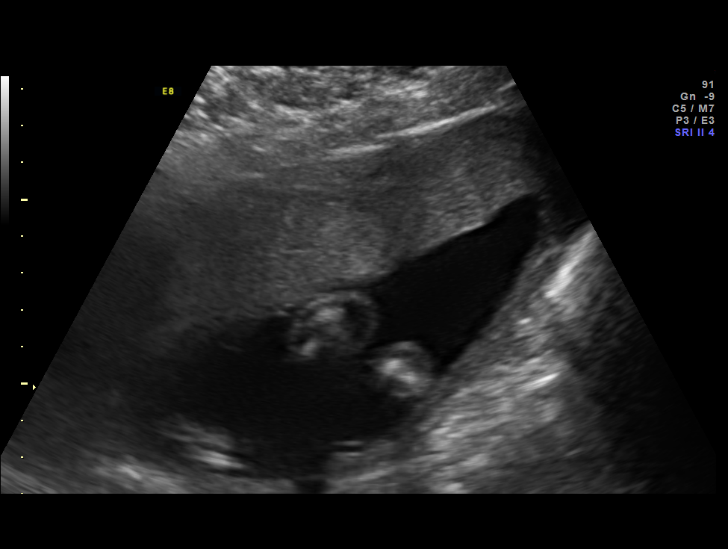
[im 15/80]
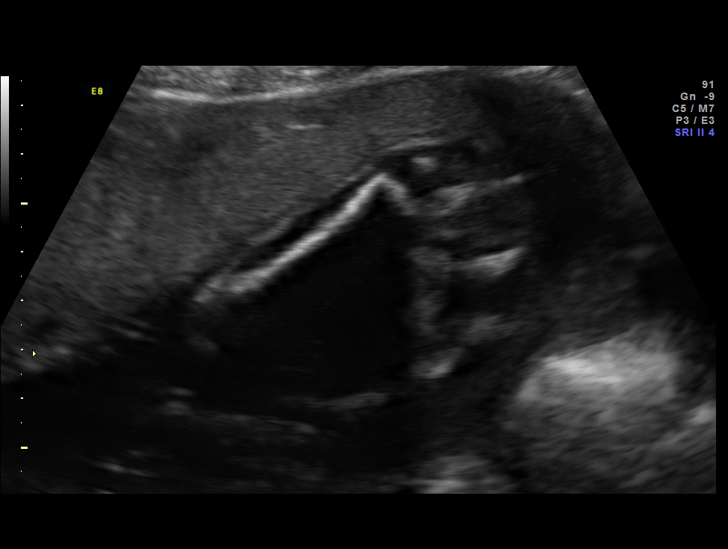
[im 24/80]
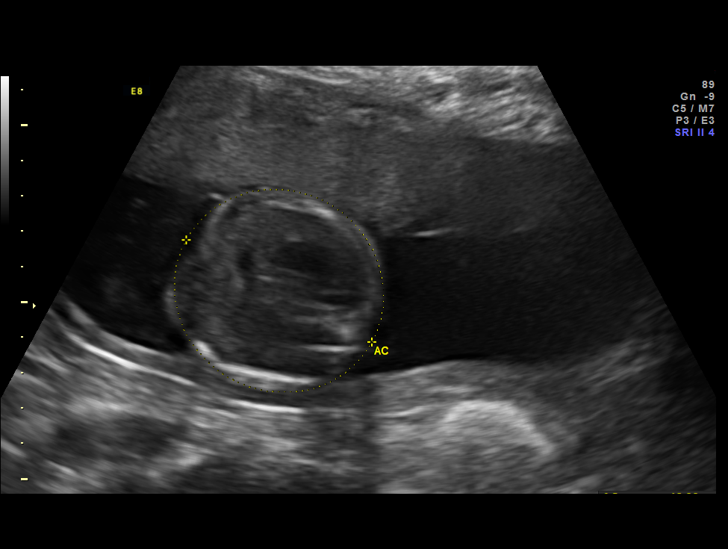
[im 30/80]
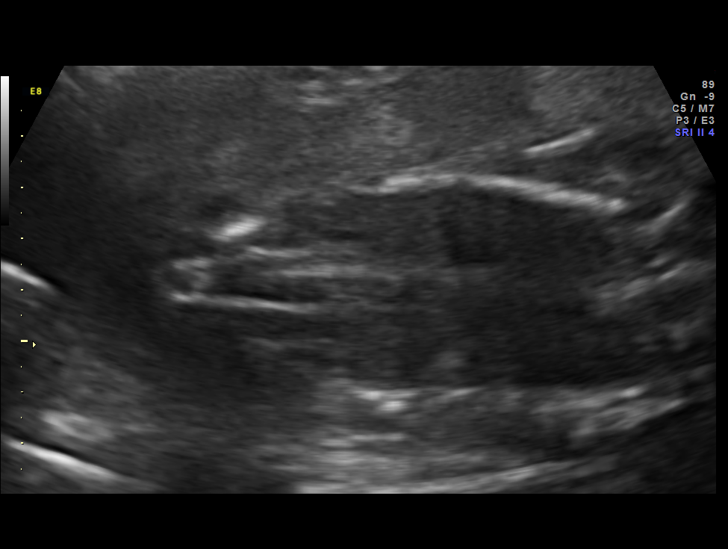
[im 36/80]
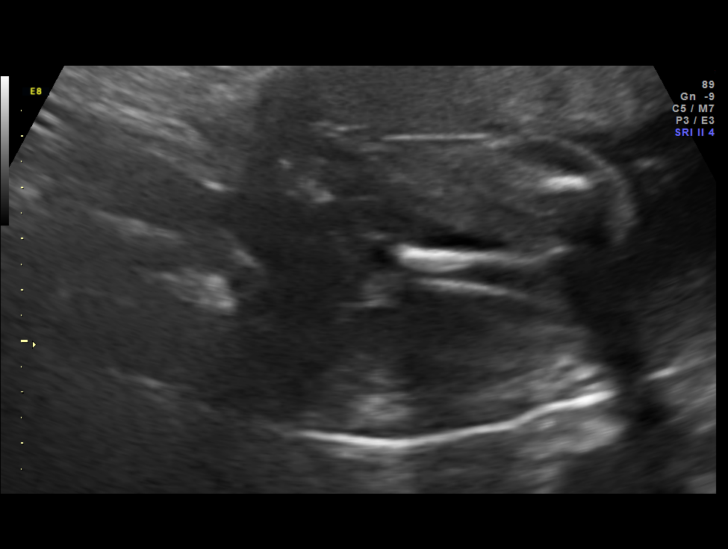
[im 44/80]
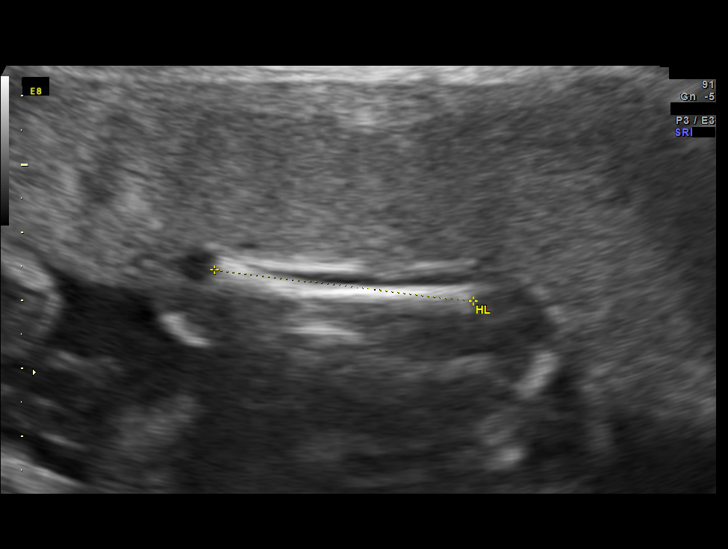
[im 50/80]
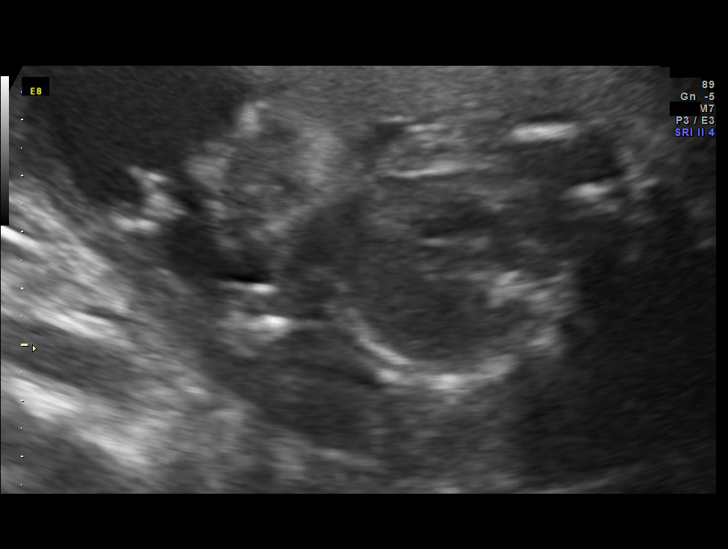
[im 56/80]
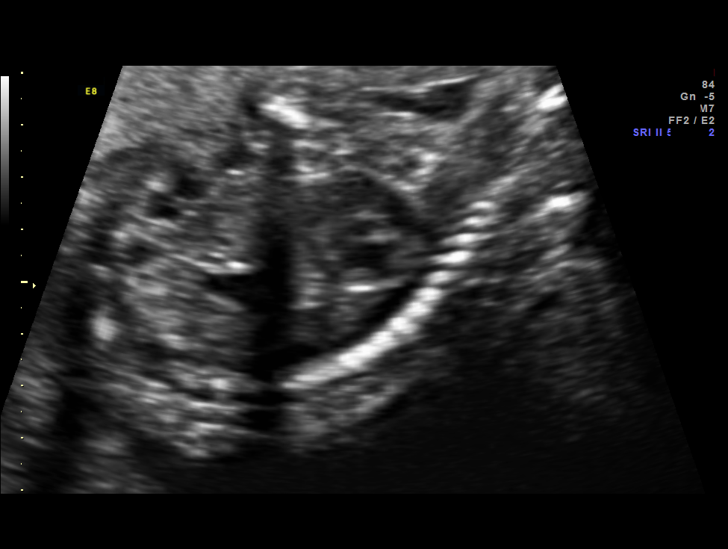
[im 65/80]
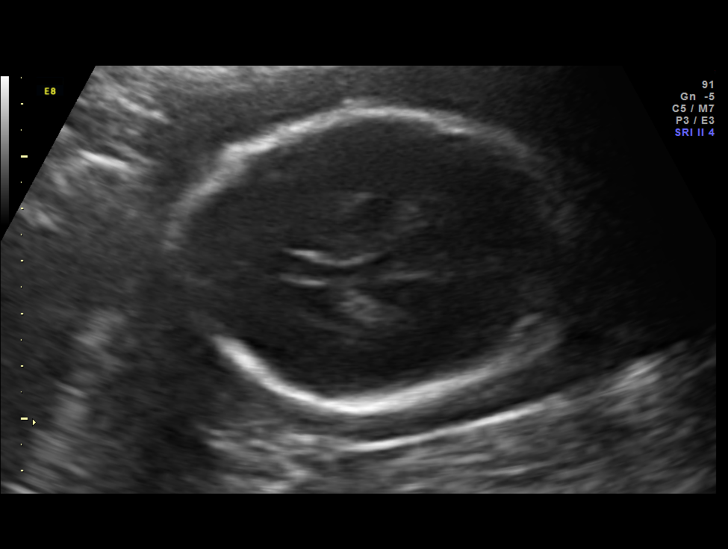
[im 71/80]
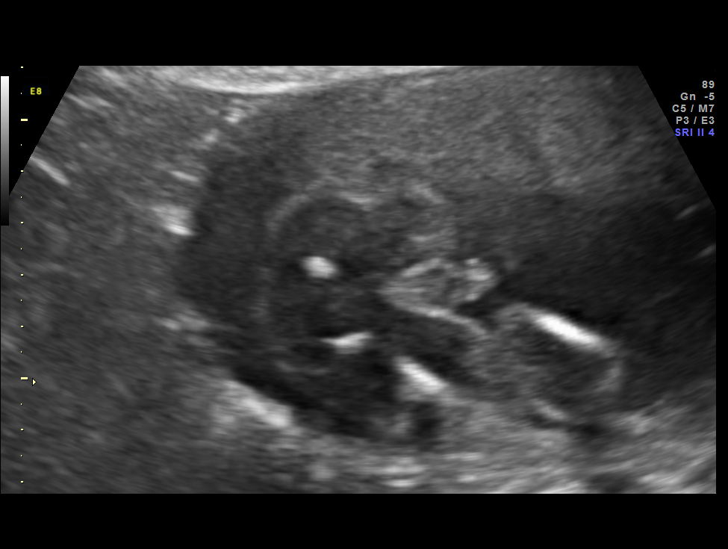
[im 77/80]
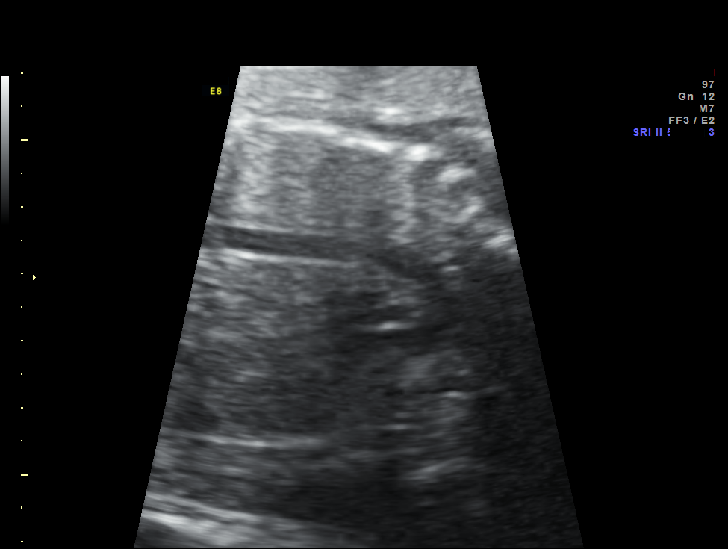

[12 of 28 positions shown; findings below may reference images not displayed]

OBSTETRICS REPORT
                      (Signed Final 03/27/2013 [DATE])

Service(s) Provided

 US OB DETAIL + 14 WK                                  76811.0
Indications

 Detailed fetal anatomic survey
 Maternal morbid obesity
 Previous cesarean section
 Poor obstetric history: Previous preeclampsia /
 eclampsia/gestational HTN
Fetal Evaluation

 Num Of Fetuses:    1
 Fetal Heart Rate:  163                          bpm
 Cardiac Activity:  Observed
 Presentation:      Breech
 Placenta:          Anterior, above cervical os
 P. Cord            Visualized
 Insertion:

 Amniotic Fluid
 AFI FV:      Subjectively within normal limits
                                             Larg Pckt:     5.0  cm
Biometry

 BPD:     54.8  mm     G. Age:  22w 5d                CI:         68.8   70 - 86
 OFD:     79.6  mm                                    FL/HC:      19.4   19.2 -

 HC:     216.3  mm     G. Age:  23w 4d       56  %    HC/AC:      1.16   1.05 -

 AC:     186.3  mm     G. Age:  23w 3d       51  %    FL/BPD:     76.6   71 - 87
 FL:        42  mm     G. Age:  23w 5d       57  %    FL/AC:      22.5   20 - 24
 HUM:     38.3  mm     G. Age:  23w 4d       50  %

 Est. FW:     601  gm      1 lb 5 oz     59  %
Gestational Age

 LMP:           23w 1d        Date:  10/16/12                 EDD:   07/23/13
 U/S Today:     23w 2d                                        EDD:   07/22/13
 Best:          23w 1d     Det. By:  LMP  (10/16/12)          EDD:   07/23/13
Anatomy

 Cranium:          Appears normal         Aortic Arch:      Appears normal
 Fetal Cavum:      Appears normal         Ductal Arch:      Appears normal
 Ventricles:       Appears normal         Diaphragm:        Appears normal
 Choroid Plexus:   Appears normal         Stomach:          Appears normal, left
                                                            sided
 Cerebellum:       Appears normal         Abdomen:          Appears normal
 Posterior Fossa:  Appears normal         Abdominal Wall:   Appears nml (cord
                                                            insert, abd wall)
 Nuchal Fold:      Not applicable (>20    Cord Vessels:     Appears normal (3
                   wks GA)                                  vessel cord)
 Face:             Appears normal         Kidneys:          Appear normal
                   (orbits and profile)
 Lips:             Appears normal         Bladder:          Appears normal
 Palate:           Appears normal         Spine:            Appears normal
 Heart:            Appears normal         Lower             Appears normal
                   (4CH, axis, and        Extremities:
                   situs)
 RVOT:             Appears normal         Upper             Appears normal
                                          Extremities:
 LVOT:             Appears normal

 Other:  Fetus appears to be a male. Heels appears normal.
Cervix Uterus Adnexa

 Cervical Length:    4.2      cm

 Cervix:       Normal appearance by transabdominal scan. Appears
               closed, without funnelling.
 Left Ovary:    Not visualized. No adnexal mass visualized.
 Right Ovary:   Not visualized. No adnexal mass visualized.
Impression

 IUP at 23+1 weeks
 Normal detailed fetal anatomy
 Normal amniotic fluid volume
 Measurements consistent with LMP dating; EFW at the 59th
 %tile
Recommendations

 Serial [place] for growth
 We would be pleased to perform these exams.  If desired,
 call to schedule.

 questions or concerns.

## 2014-04-06 ENCOUNTER — Ambulatory Visit (INDEPENDENT_AMBULATORY_CARE_PROVIDER_SITE_OTHER): Payer: BC Managed Care – PPO | Admitting: Physician Assistant

## 2014-04-06 VITALS — BP 128/68 | HR 78 | Temp 97.8°F | Resp 16 | Ht 62.75 in | Wt 289.0 lb

## 2014-04-06 DIAGNOSIS — F419 Anxiety disorder, unspecified: Secondary | ICD-10-CM

## 2014-04-06 DIAGNOSIS — J029 Acute pharyngitis, unspecified: Secondary | ICD-10-CM

## 2014-04-06 DIAGNOSIS — G4700 Insomnia, unspecified: Secondary | ICD-10-CM

## 2014-04-06 LAB — POCT RAPID STREP A (OFFICE): Rapid Strep A Screen: NEGATIVE

## 2014-04-06 LAB — POCT CBC
Granulocyte percent: 66.7 %G (ref 37–80)
HCT, POC: 45.8 % (ref 37.7–47.9)
Hemoglobin: 14.6 g/dL (ref 12.2–16.2)
Lymph, poc: 2.3 (ref 0.6–3.4)
MCH, POC: 28.9 pg (ref 27–31.2)
MCHC: 31.9 g/dL (ref 31.8–35.4)
MCV: 90.5 fL (ref 80–97)
MID (cbc): 0.5 (ref 0–0.9)
MPV: 6.4 fL (ref 0–99.8)
POC Granulocyte: 5.5 (ref 2–6.9)
POC LYMPH PERCENT: 27.5 %L (ref 10–50)
POC MID %: 5.8 %M (ref 0–12)
Platelet Count, POC: 377 10*3/uL (ref 142–424)
RBC: 5.06 M/uL (ref 4.04–5.48)
RDW, POC: 12.8 %
WBC: 8.3 10*3/uL (ref 4.6–10.2)

## 2014-04-06 MED ORDER — FLUTICASONE PROPIONATE 50 MCG/ACT NA SUSP: 2.0000 | Freq: Every day | NASAL | Status: DC | PRN

## 2014-04-06 MED ORDER — TRAZODONE HCL 100 MG PO TABS: 100.0000 mg | ORAL_TABLET | Freq: Every day | ORAL | Status: DC

## 2014-04-06 MED ORDER — NAPROXEN 500 MG PO TABS: 500.0000 mg | ORAL_TABLET | Freq: Two times a day (BID) | ORAL | Status: DC

## 2014-04-06 MED ORDER — SERTRALINE HCL 50 MG PO TABS: 75.0000 mg | ORAL_TABLET | Freq: Every day | ORAL | Status: DC

## 2014-04-06 NOTE — Progress Notes (Signed)
IDENTIFYING INFORMATION  Ann Santos / DOB: 11/09/1980 / MRN: [id number]  The patient has Anxiety; Umbilical hernia; Diverticular disease; and Atypical chest pain on her problem list.  SUBJECTIVE  CC: Sore Throat and Medication Refill   HPI: Ann Santos is a 33 y.o. y.o. female presenting with roughly 20 days of throat pain that is typically worse at night, at which point she says her throat hurst so bad that she does not want to talk or swallow. However, she is eating and drinking without difficulty.  She [facility] around the [place] holiday and was told she had a bacterial infection, and was prescribed Augmentin 875 and completed this course.  She does not think it helped.  She has  Along history of allergies, which she treats with Zyrtec, and this helps her symptoms. She reports her symptoms do get worse this time of year.  She does not use an intranasal corticosteroid.  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  She is negative for anhedonia, dysthymia, sleep and concentration changes.    She denies a history of GERD, and does not take an medications for this.   She would like a refill of her trazodone today, and says that she would like 100 mg (vs. the 50 mg tabs) as this will give her the option to take 50 mg or 100 mg and she will not have to worry about running out of medications.   She  has a past medical history of Diverticulitis; Anxiety; Diverticulosis; Umbilical hernia; Constipation; Abdominal distention; Abdominal pain; Blood in stool; Rectal bleeding; Hypertension; Anxiety; Gestational hypertension (07/05/2013); Maternal obesity, antepartum (07/05/2013); Gestational hypertension (07/05/2013); and Maternal obesity, antepartum (07/05/2013).    She has a current medication list which includes the following prescription(s): acetaminophen, fluticasone, ibuprofen,  ipratropium, nifedipine, multivitamin-prenatal, probiotic product, rizatriptan, senna-docusate, sertraline, tramadol, trazodone, and naproxen.  Ann Santos has No Known Allergies. She  reports that she has never smoked. She has never used smokeless tobacco. She reports that she drinks alcohol. She reports that she does not use illicit drugs. She  reports that she currently engages in sexual activity.  The patient  has past surgical history that includes Colonoscopy (10/07/2011); Esophagogastroduodenoscopy (10/07/2011); Cesarean section (05/28/09); Scar revision (09/17/09); Umbilical hernia repair (11/01/2011); and Cesarean section (N/A, 07/05/2013).  Her family history includes Cancer in her maternal grandmother; Diabetes in her mother; Hypertension in her father and mother; Ulcerative colitis in her father.  Review of Systems  Constitutional: Positive for malaise/fatigue. Negative for fever, chills, weight loss and diaphoresis.  HENT: Positive for congestion (mild) and sore throat.   Respiratory: Negative for cough and shortness of breath.   Cardiovascular: Negative for chest pain, orthopnea and PND.  Gastrointestinal: Positive for nausea. Negative for vomiting and abdominal pain.  Genitourinary: Negative for dysuria, urgency and frequency.  Musculoskeletal: Negative for myalgias.  Skin: Negative.   Neurological: Positive for headaches. Negative for dizziness and weakness.  Endo/Heme/Allergies: Negative for polydipsia.  Psychiatric/Behavioral: Negative for depression. The patient is nervous/anxious.     OBJECTIVE  Blood pressure 128/68, pulse 78, temperature 97.8 F (36.6 C), temperature source Oral, resp. rate 16, height 5' 2.75" (1.594 m), weight 289 lb (131.09 kg), SpO2 96 %, currently breastfeeding. The patient's body mass index is 51.59 kg/(m^2).  Physical Exam  Results for orders placed or performed in  visit on 04/06/14 (from the past 24 hour(s))  POCT CBC     Status: None   Collection  Time: 04/06/14  9:09 AM  Result Value Ref Range   WBC 8.3 4.6 - 10.2 K/uL   Lymph, poc 2.3 0.6 - 3.4   POC LYMPH PERCENT 27.5 10 - 50 %L   MID (cbc) 0.5 0 - 0.9   POC MID % 5.8 0 - 12 %M   POC Granulocyte 5.5 2 - 6.9   Granulocyte percent 66.7 37 - 80 %G   RBC 5.06 4.04 - 5.48 M/uL   Hemoglobin 14.6 12.2 - 16.2 g/dL   HCT, POC 16.145.8 09.637.7 - 47.9 %   MCV 90.5 80 - 97 fL   MCH, POC 28.9 27 - 31.2 pg   MCHC 31.9 31.8 - 35.4 g/dL   RDW, POC 04.512.8 %   Platelet Count, POC 377 142 - 424 K/uL   MPV 6.4 0 - 99.8 fL  POCT rapid strep A     Status: None   Collection Time: 04/06/14  9:09 AM  Result Value Ref Range   Rapid Strep A Screen Negative Negative    ASSESSMENT & PLAN  Milea was seen today for sore throat and medication refill.  Diagnoses and associated orders for this visit:  Sore throat:  Not likely viral or bacterial in nature given lack of proliferation in lymph and granulocyte percents.  Will treat for pain with NSAID and prescribe Flonase to rule out allergies.  Patient is to stop taking her NSAIDS two days before follow up.  Should this plan fail to alleviate her pain, will pursue GERD/LPR and will f/u.  Should that fail will obtain HENT radiograph and likely refer to ENT.   - POCT CBC - POCT rapid strep A - Culture, Group A Strep -     Naproxen 500 mg bid.  -     Flonase intranasal  Insomnia - traZODone (DESYREL) 100 MG tablet; Take 1 tablet (100 mg total) by mouth at bedtime. -     Several resources checked and the availability to this drug to the infant was found to be           insignificant.    Depression -     Refilled sertraline at previous dose.  Patient left her prescription in [place].   The patient was instructed to to call or comeback to clinic as needed, or should symptoms warrant.  Deliah BostonMichael Kathee Tumlin, MHS, PA-C [facility] 04/06/2014 9:17 AM

## 2014-04-08 LAB — CULTURE, GROUP A STREP: Organism ID, Bacteria: NORMAL

## 2014-04-10 IMAGING — US US OB FOLLOW-UP
1 series · 12 of 28 positions shown · non-contrast
Comparison: none

[Series 1: us ob follow-up · 0.27mm/px · 12 of 38 slices shown]
[im 2/38]
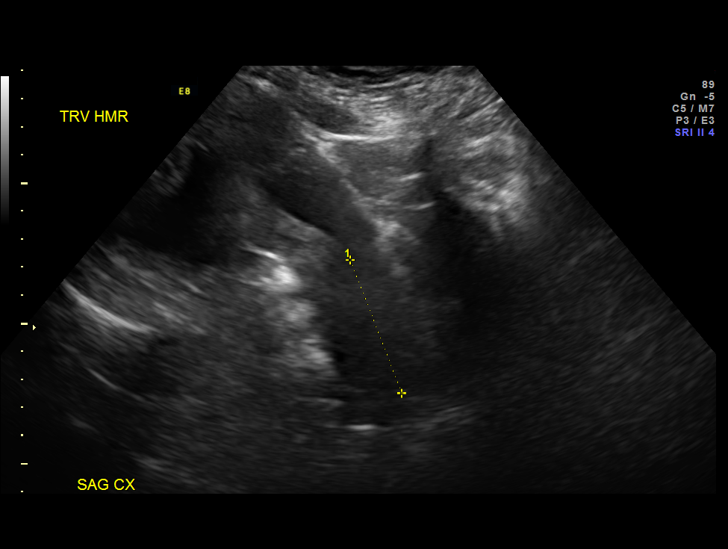
[im 5/38]
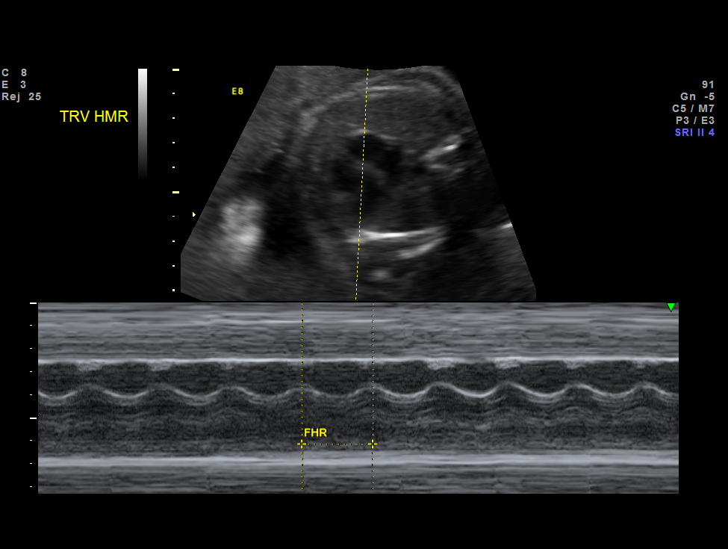
[im 7/38]
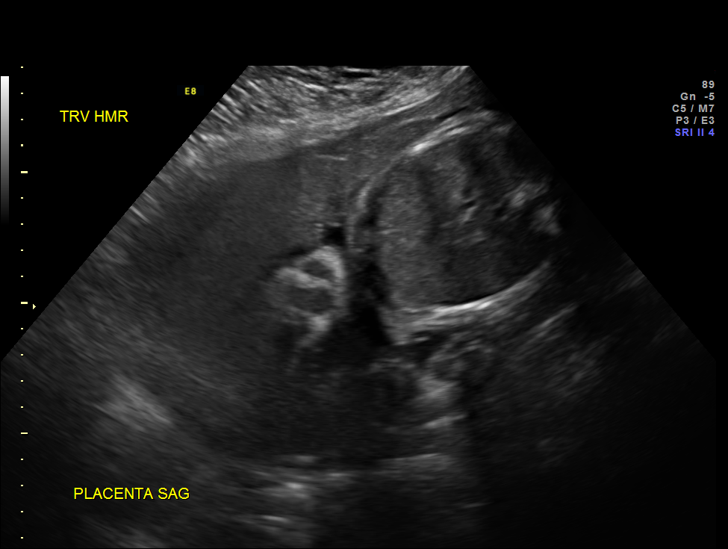
[im 11/38]
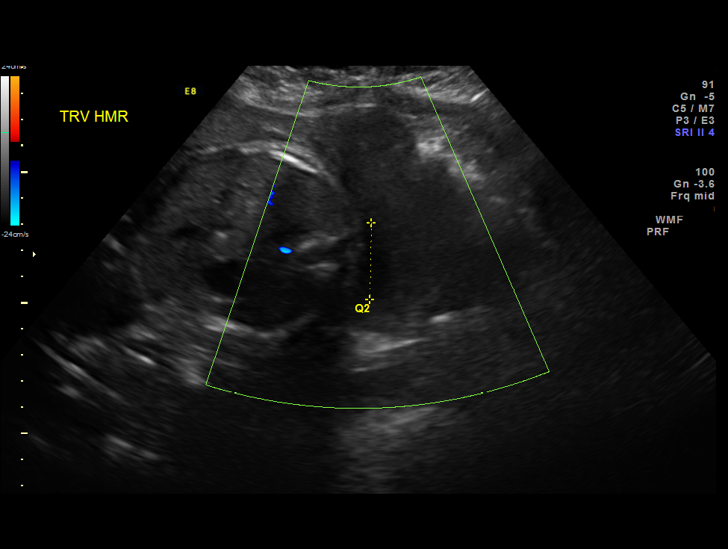
[im 14/38]
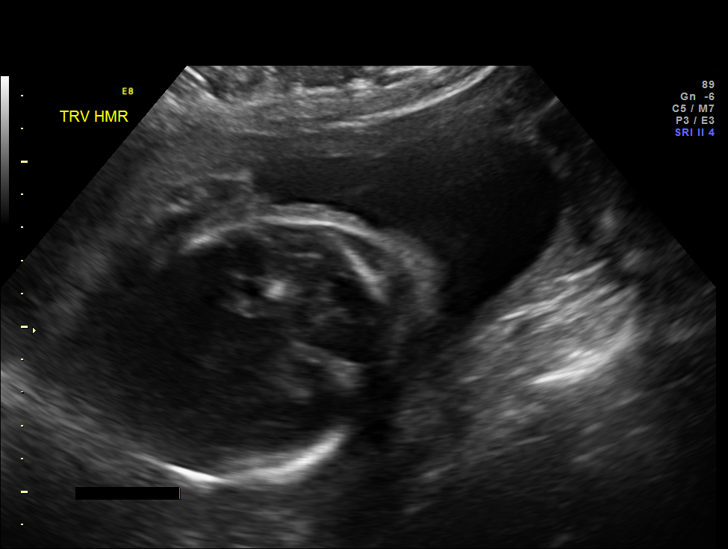
[im 17/38]
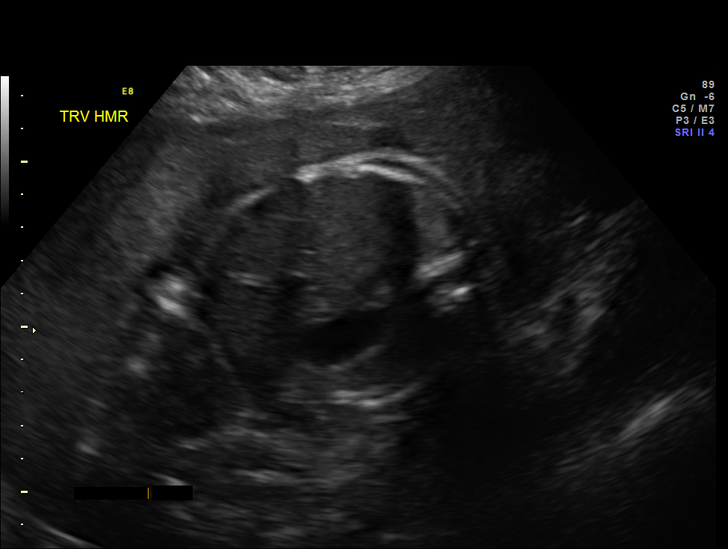
[im 21/38]
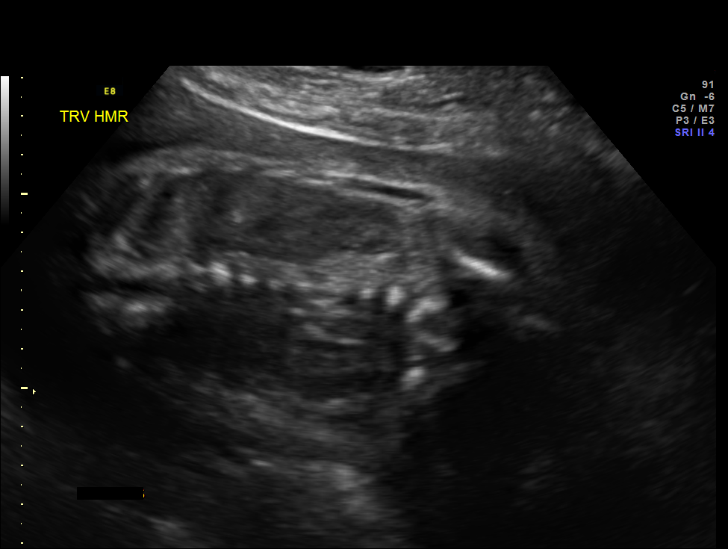
[im 24/38]
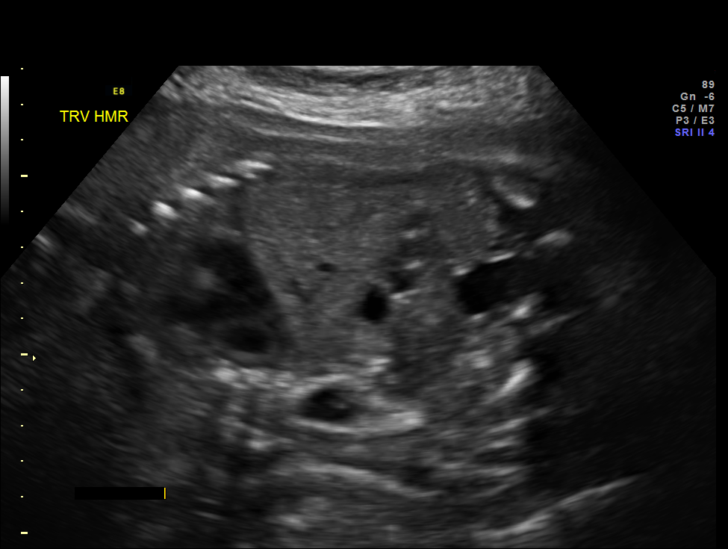
[im 27/38]
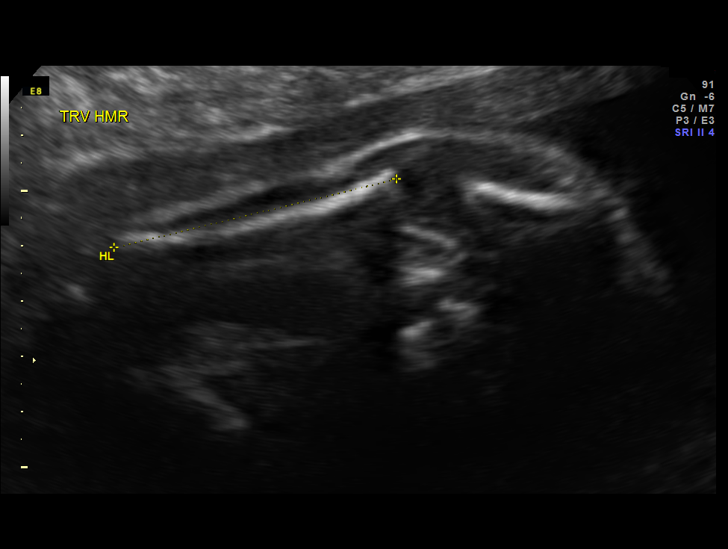
[im 31/38]
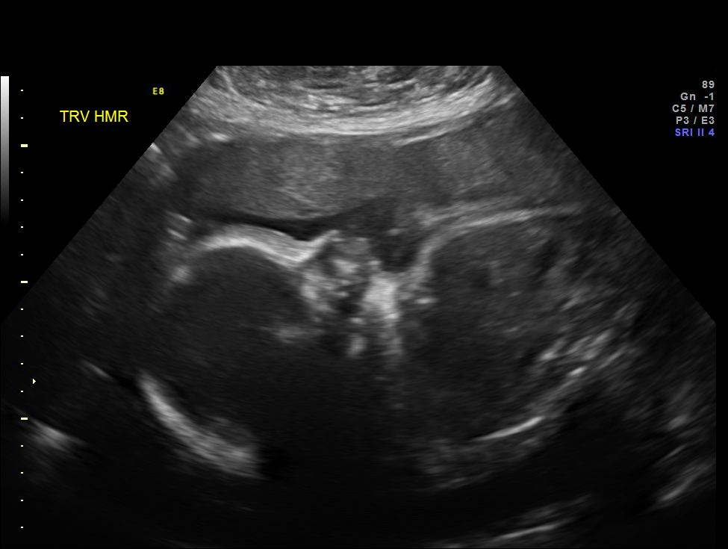
[im 33/38]
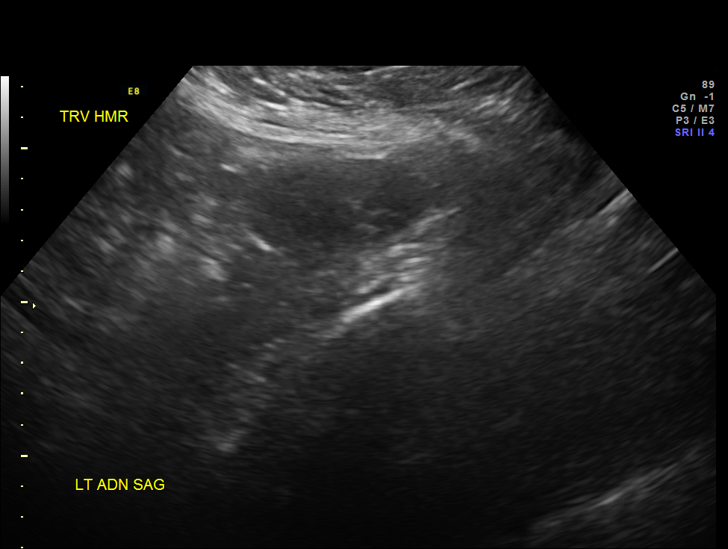
[im 36/38]
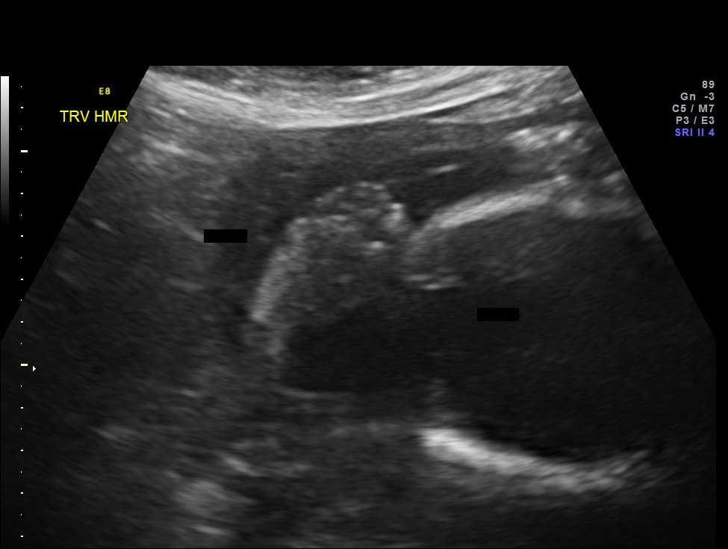

[12 of 28 positions shown; findings below may reference images not displayed]

OBSTETRICS REPORT
                      (Signed Final 05/09/2013 [DATE])

Service(s) Provided

 US OB FOLLOW UP                                       76816.1
Indications

 Poor obstetric history: Previous preeclampsia /
 eclampsia/gestational HTN
 Maternal morbid obesity (303 lb)
 Previous cesarean section
Fetal Evaluation

 Num Of Fetuses:    1
 Fetal Heart Rate:  142                          bpm
 Cardiac Activity:  Observed
 Presentation:      Transverse, head to
                    maternal right
 Placenta:          Anterior, above cervical os
 P. Cord            Previously Visualized
 Insertion:

 Amniotic Fluid
 AFI FV:      Subjectively within normal limits
 AFI Sum:     12.54   cm       34  %Tile     Larg Pckt:    5.51  cm
 RUQ:   5.51    cm   RLQ:    1.65   cm    LUQ:   2.94    cm   LLQ:    2.44   cm
Biometry

 BPD:       77  mm     G. Age:  30w 6d                CI:         76.8   70 - 86
 OFD:    100.3  mm                                    FL/HC:      20.1   19.6 -

 HC:     285.3  mm     G. Age:  31w 2d       76  %    HC/AC:      1.05   0.99 -

 AC:       271  mm     G. Age:  31w 1d       91  %    FL/BPD:     74.4   71 - 87
 FL:      57.3  mm     G. Age:  30w 0d       56  %    FL/AC:      21.1   20 - 24
 HUM:     51.9  mm     G. Age:  30w 2d       67  %
 CER:     34.3  mm     G. Age:  29w 5d       58  %

 Est. FW:    7469  gm    3 lb 10 oz      78  %
Gestational Age

 LMP:           29w 2d        Date:  10/16/12                 EDD:   07/23/13
 U/S Today:     30w 6d                                        EDD:   07/12/13
 Best:          29w 2d     Det. By:  LMP  (10/16/12)          EDD:   07/23/13
Anatomy

 Cranium:          Appears normal         Aortic Arch:      Previously seen
 Fetal Cavum:      Appears normal         Ductal Arch:      Previously seen
 Ventricles:       Appears normal         Diaphragm:        Appears normal
 Choroid Plexus:   Previously seen        Stomach:          Appears normal, left
                                                            sided
 Cerebellum:       Previously seen        Abdomen:          Appears normal
 Posterior Fossa:  Previously seen        Abdominal Wall:   Previously seen
 Nuchal Fold:      Not applicable (>20    Cord Vessels:     Previously seen
                   wks GA)
 Face:             Orbits and profile     Kidneys:          Appear normal
                   previously seen
 Lips:             Previously seen        Bladder:          Appears normal
 Palate:           Previously seen        Spine:            Previously seen
 Heart:            Appears normal         Lower             Previously seen
                   (4CH, axis, and        Extremities:
                   situs)
 RVOT:             Previously seen        Upper             Previously seen
                                          Extremities:
 LVOT:             Previously seen

 Other:  Male gender previously seen. Heels previously seen.
Targeted Anatomy

 Fetal Central Nervous System
 Cisterna Magna:
Cervix Uterus Adnexa

 Cervical Length:    5        cm

 Cervix:       Normal appearance by transabdominal scan.

 Adnexa:     No abnormality visualized.
Impression

 Single IUP at 29 [DATE] weeks
 Hx of preeclampsia with previous pregnancy
 Interval growth is appropriate (78th %tile)
 Normal amniotic fluid volme

 BPs: 131/93, 132/94 (new finding)
Recommendations

 [facility] was sent to [facility] for [facility] blood pressures and
 preeclamspia labs - discussed with Dr. Heredia
 Recommend serial growth scans - follow up in 4 weeks for
 interval growth

 questions or concerns.

## 2014-06-26 ENCOUNTER — Ambulatory Visit: Payer: Self-pay | Admitting: Family Medicine

## 2014-06-26 ENCOUNTER — Other Ambulatory Visit (INDEPENDENT_AMBULATORY_CARE_PROVIDER_SITE_OTHER): Payer: Self-pay | Admitting: Surgery

## 2014-07-21 ENCOUNTER — Other Ambulatory Visit: Payer: Self-pay

## 2014-07-21 ENCOUNTER — Ambulatory Visit (HOSPITAL_COMMUNITY)
Admission: RE | Admit: 2014-07-21 | Discharge: 2014-07-21 | Disposition: A | Discharge status: Home / Self Care | Payer: BLUE CROSS/BLUE SHIELD | Source: Ambulatory Visit | Attending: Surgery | Admitting: Surgery

## 2014-07-21 DIAGNOSIS — K449 Diaphragmatic hernia without obstruction or gangrene: Secondary | ICD-10-CM | POA: Diagnosis not present

## 2014-07-21 DIAGNOSIS — G4700 Insomnia, unspecified: Secondary | ICD-10-CM

## 2014-07-21 MED ORDER — TRAZODONE HCL 100 MG PO TABS: 100.0000 mg | ORAL_TABLET | Freq: Every day | ORAL | Status: DC

## 2014-07-22 ENCOUNTER — Ambulatory Visit (HOSPITAL_COMMUNITY)
Admission: RE | Admit: 2014-07-22 | Discharge: 2014-07-22 | Disposition: A | Discharge status: Home / Self Care | Payer: BLUE CROSS/BLUE SHIELD | Source: Ambulatory Visit | Attending: Surgery | Admitting: Surgery

## 2014-07-22 ENCOUNTER — Encounter (HOSPITAL_COMMUNITY)
Admission: RE | Disposition: A | Discharge status: Home / Self Care | Payer: BLUE CROSS/BLUE SHIELD | Source: Ambulatory Visit | Attending: Surgery

## 2014-07-22 HISTORY — PX: BREATH TEK H PYLORI: SHX5422

## 2014-07-22 SURGERY — BREATH TEST, FOR HELICOBACTER PYLORI

## 2014-07-22 NOTE — Progress Notes (Signed)
   07/22/14 1003  BREATH TEK ASSESSMENT  Referring MD Ezzard StandingNewman  Time of Last PO Intake 2000  The Patient Had The Following Meds In The Last Two Weeks Antibiotics (Amoxicillian 07/08/14)  Baseline Breath At: 0839  Pranactin Given At: 0843  Post-Dose Breath At: 0858  Sample 1 4.5%  Sample 2 2.6%  Test Negative

## 2014-07-23 ENCOUNTER — Encounter (HOSPITAL_COMMUNITY): Payer: Self-pay | Admitting: Surgery

## 2014-08-12 ENCOUNTER — Ambulatory Visit (INDEPENDENT_AMBULATORY_CARE_PROVIDER_SITE_OTHER): Payer: BLUE CROSS/BLUE SHIELD | Admitting: Family Medicine

## 2014-08-12 ENCOUNTER — Encounter: Payer: Self-pay | Admitting: Family Medicine

## 2014-08-13 ENCOUNTER — Encounter: Payer: Self-pay | Admitting: Family Medicine

## 2014-08-13 NOTE — Progress Notes (Signed)
S:  This 34 y.o. Female has chronic obesity and has been attempting weight loss for at least 6 months. She has managed some weight loss w/ Weight [employer], short-term phentermine and a regular exercise program. Pt needs documentation for weight loss efforts for  Weight-loss management plan and pre-surgery for gastric sleeve; she must demonstrate medical care and follow-up with documented weights (has forms ro be completed). Forms need to show weight loss management plans for at least 6 months.  Patient Active Problem List   Diagnosis Date Noted  . Atypical chest pain 07/17/2013  . Diverticular disease 10/23/2012  . Umbilical hernia 10/26/2011  . Anxiety 07/26/2011    Prior to Admission medications   Medication Sig Start Date End Date Taking? Authorizing Provider  acetaminophen (TYLENOL) 500 MG tablet Take 500 mg by mouth every 6 (six) hours as needed for moderate pain.   Yes Historical Provider, MD  fluticasone (FLONASE) 50 MCG/ACT nasal spray Place 2 sprays into both nostrils daily as needed for allergies or rhinitis. 04/06/14  Yes Ofilia NeasMichael L Clark, PA-C  ibuprofen (ADVIL,MOTRIN) 600 MG tablet Take 1 tablet (600 mg total) by mouth every 6 (six) hours. 07/07/13  Yes Arlana LindauJulie Fisher, NP  naproxen (NAPROSYN) 500 MG tablet Take 1 tablet (500 mg total) by mouth 2 (two) times daily with a meal. 04/06/14  Yes Ofilia NeasMichael L Clark, PA-C  phentermine 37.5 MG capsule Take 37.5 mg by mouth every morning.   Yes Historical Provider, MD  Probiotic Product (PROBIOTIC COLON SUPPORT PO) Take 1 tablet by mouth daily.   Yes Historical Provider, MD  sertraline (ZOLOFT) 50 MG tablet Take 1.5 tablets (75 mg total) by mouth at bedtime. 04/06/14  Yes Ofilia NeasMichael L Clark, PA-C  traZODone (DESYREL) 100 MG tablet Take 1 tablet (100 mg total) by mouth at bedtime. 07/21/14  Yes Ofilia NeasMichael L Clark, PA-C  traMADol (ULTRAM) 50 MG tablet TAKE 1 TABLET EVERY 8 HOURS AS NEEDED Patient not taking: Reported on 08/12/2014 07/26/13   Maurice MarchBarbara B  Anelia Carriveau, MD    SURG, SOC and FAM HX reviewed.  ROS: As per HPI.  O: Filed Vitals:   08/12/14 1607  BP: 129/83  Pulse: 89  Temp: 98.8 F (37.1 C)  Resp: 16    GEN: In NAD; Wn,WD. Weight was 289 lbs in Dec 2015. HENT: [place]/AT. EOMI w/ clear conj/sclerae. Otherwise unremarkable. COR: RRR. LUNGS; Normal resp rate and effort. SKIN; W&D. Intact w/o pallor. NEURO: A&O x 3; CNs intact. Nonfocal.  A/P: Severe obesity (BMI >= 40)- Forms completed for pt documenting weight loss plan,etc. Forms scanned nto [employer].

## 2014-08-30 ENCOUNTER — Ambulatory Visit: Payer: Self-pay | Admitting: Dietician

## 2014-09-08 ENCOUNTER — Ambulatory Visit: Payer: Self-pay | Admitting: Dietician

## 2014-09-10 ENCOUNTER — Encounter: Payer: Self-pay | Admitting: Dietician

## 2014-09-10 ENCOUNTER — Encounter: Payer: BLUE CROSS/BLUE SHIELD | Attending: Surgery | Admitting: Dietician

## 2014-09-10 VITALS — Ht 62.0 in | Wt 296.2 lb

## 2014-09-10 DIAGNOSIS — Z6841 Body Mass Index (BMI) 40.0 and over, adult: Secondary | ICD-10-CM | POA: Diagnosis not present

## 2014-09-10 DIAGNOSIS — E669 Obesity, unspecified: Secondary | ICD-10-CM | POA: Insufficient documentation

## 2014-09-10 DIAGNOSIS — Z713 Dietary counseling and surveillance: Secondary | ICD-10-CM | POA: Insufficient documentation

## 2014-09-10 NOTE — Progress Notes (Signed)
  Pre-Op Assessment Visit:  Pre-Operative Sleeve Gastrectomy Surgery  Medical Nutrition Therapy:  Appt start time: 1445  End time:  1530.  Patient was seen on 09/10/2014 for Pre-Operative Nutrition Assessment. Assessment [facility] on 09/10/2014.   Preferred Learning Style:   No preference indicated   Learning Readiness:   Ready  Handouts given during visit include:  Pre-Op Goals Bariatric Surgery Protein Shakes   During the appointment today the following Pre-Op Goals were reviewed with the patient: Maintain or lose weight as instructed by your surgeon Make healthy food choices Begin to limit portion sizes Limited concentrated sugars and fried foods Keep fat/sugar in the single digits per serving on   food labels Practice CHEWING your food  (aim for 30 chews per bite or until applesauce consistency) Practice not drinking 15 minutes before, during, and 30 minutes after each meal/snack Avoid all carbonated beverages  Avoid/limit caffeinated beverages  Avoid all sugar-sweetened beverages Consume 3 meals per day; eat every 3-5 hours Make a list of non-food related activities Aim for 64-100 ounces of FLUID daily  Aim for at least 60-80 grams of PROTEIN daily Look for a liquid protein source that contain ?15 g protein and ?5 g carbohydrate  (ex: shakes, drinks, shots)  Patient-Centered Goals: She would like to help her entire family and improve their healthy lifestyle and increase activities.  10 level of confidence/10 level of importance   Demonstrated degree of understanding via:  Teach Back  Teaching Method Utilized:  Visual Auditory Hands on  Barriers to learning/adherence to lifestyle change: none  Patient to call the [facility] to enroll in Pre-Op and Post-Op Nutrition Education when surgery date is scheduled.

## 2014-09-10 NOTE — Patient Instructions (Signed)

## 2014-10-02 ENCOUNTER — Ambulatory Visit (INDEPENDENT_AMBULATORY_CARE_PROVIDER_SITE_OTHER): Payer: BLUE CROSS/BLUE SHIELD | Admitting: Family Medicine

## 2014-10-02 VITALS — BP 110/72 | HR 91 | Temp 99.0°F | Resp 18 | Ht 62.0 in | Wt 296.0 lb

## 2014-10-02 DIAGNOSIS — J029 Acute pharyngitis, unspecified: Secondary | ICD-10-CM

## 2014-10-02 DIAGNOSIS — R5383 Other fatigue: Secondary | ICD-10-CM

## 2014-10-02 DIAGNOSIS — L309 Dermatitis, unspecified: Secondary | ICD-10-CM

## 2014-10-02 DIAGNOSIS — F419 Anxiety disorder, unspecified: Secondary | ICD-10-CM

## 2014-10-02 LAB — COMPREHENSIVE METABOLIC PANEL
ALT: 19 U/L (ref 0–35)
AST: 19 U/L (ref 0–37)
Albumin: 4.2 g/dL (ref 3.5–5.2)
Alkaline Phosphatase: 66 U/L (ref 39–117)
BUN: 11 mg/dL (ref 6–23)
CO2: 25 mEq/L (ref 19–32)
Calcium: 9.2 mg/dL (ref 8.4–10.5)
Chloride: 102 mEq/L (ref 96–112)
Creat: 0.51 mg/dL (ref 0.50–1.10)
Glucose, Bld: 89 mg/dL (ref 70–99)
Potassium: 3.9 mEq/L (ref 3.5–5.3)
Sodium: 137 mEq/L (ref 135–145)
Total Bilirubin: 0.7 mg/dL (ref 0.2–1.2)
Total Protein: 7.3 g/dL (ref 6.0–8.3)

## 2014-10-02 LAB — POCT CBC
Granulocyte percent: 78.5 %G (ref 37–80)
HCT, POC: 45.6 % (ref 37.7–47.9)
Hemoglobin: 14.6 g/dL (ref 12.2–16.2)
Lymph, poc: 1.6 (ref 0.6–3.4)
MCH, POC: 28.4 pg (ref 27–31.2)
MCHC: 32 g/dL (ref 31.8–35.4)
MCV: 88.8 fL (ref 80–97)
MID (cbc): 1.3 — AB (ref 0–0.9)
MPV: 6.5 fL (ref 0–99.8)
POC Granulocyte: 10.8 — AB (ref 2–6.9)
POC LYMPH PERCENT: 11.9 %L (ref 10–50)
POC MID %: 9.6 %M (ref 0–12)
Platelet Count, POC: 326 10*3/uL (ref 142–424)
RBC: 5.14 M/uL (ref 4.04–5.48)
RDW, POC: 13.7 %
WBC: 13.7 10*3/uL — AB (ref 4.6–10.2)

## 2014-10-02 LAB — POCT SKIN KOH: Skin KOH, POC: NEGATIVE

## 2014-10-02 LAB — TSH: TSH: 1.723 u[IU]/mL (ref 0.350–4.500)

## 2014-10-02 LAB — POCT RAPID STREP A (OFFICE): Rapid Strep A Screen: NEGATIVE

## 2014-10-02 MED ORDER — AMOXICILLIN 875 MG PO TABS: 875.0000 mg | ORAL_TABLET | Freq: Two times a day (BID) | ORAL | Status: DC

## 2014-10-02 MED ORDER — SERTRALINE HCL 100 MG PO TABS
100.0000 mg | ORAL_TABLET | Freq: Every day | ORAL | Status: DC
Start: 2014-10-02 — End: 2015-09-03

## 2014-10-02 MED ORDER — FLUCONAZOLE 150 MG PO TABS: 150.0000 mg | ORAL_TABLET | Freq: Once | ORAL | Status: DC

## 2014-10-02 NOTE — Patient Instructions (Signed)
Take the amoxicillin one twice daily for infection  We will try and let you know in a few days the results of your throat culture. If it is positive you should continue the antibiotic for a full 10 days. If it is negative you can just discontinue the amoxicillin  If you develop yeastlike symptoms then get your Diflucan prescription filled. If you do not use it just destroy it, it is not best just to keep prescriptions around.  Take Tylenol ibuprofen for the sore throat and discomfort  Use over-the-counter hydrocortisone cream on the skin rash 2 or 3 times daily. If it develops more of a fungal look with the central clearing and active border of the lesion, then get some over-the-counter clotrimazole cream or terbinafine cream and use it 2 or 3 times daily  Return if further problems

## 2014-10-02 NOTE — Progress Notes (Signed)
  Subjective:  Patient ID: Ann Santos, female    DOB: 1980-07-31  Age: 34 y.o. MRN: [id number]  34 year old lady who's not been feeling well the last week. 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 She is on a prostaglandin antianxiety medication. She doesn't sleep well though she usually has been able to sleep well the past when she takes her trazodone. She is tired. She's had a sore throat. She feels like she is sick. Her child has been ill and irritable. She has a skin rash on her left arm she wants me look at. She has the sore throat is noted.  She is going to have a gastric sleeve this summer.   Objective:   Obese lady pleasant in no major distress. TMs normal. Throat only a little bit edematous looking but not erythematous. Neck supple without significant nodes though she thought she had some nodes. Her chest is clear. Heart regular without murmurs. She has a small area of dermatitis about 1.5 cm in diameter left arm that looks fungal. She says is only been there a few days and started with some little blisters. Her skin is otherwise normal. Abdomen soft without mass or tenderness.  Results for orders placed or performed in visit on 10/02/14  POCT CBC  Result Value Ref Range   WBC 13.7 (A) 4.6 - 10.2 K/uL   Lymph, poc 1.6 0.6 - 3.4   POC LYMPH PERCENT 11.9 10 - 50 %L   MID (cbc) 1.3 (A) 0 - 0.9   POC MID % 9.6 0 - 12 %M   POC Granulocyte 10.8 (A) 2 - 6.9   Granulocyte percent 78.5 37 - 80 %G   RBC 5.14 4.04 - 5.48 M/uL   Hemoglobin 14.6 12.2 - 16.2 g/dL   HCT, POC 18.8 41.6 - 47.9 %   MCV 88.8 80 - 97 fL   MCH, POC 28.4 27 - 31.2 pg   MCHC 32.0 31.8 - 35.4 g/dL   RDW, POC 60.6 %   Platelet Count, POC 326 142 - 424 K/uL   MPV 6.5 0 - 99.8 fL  POCT rapid strep A  Result Value Ref Range   Rapid Strep A Screen Negative Negative  POCT Skin KOH  Result Value Ref Range   Skin KOH, POC Negative      Assessment & Plan:   Assessment: Generalized malaise Sore  throat Fatigue Leukocytosis  Plan: Decided to go ahead and place her on amoxicillin pending the culture report Patient Instructions  Take the amoxicillin one twice daily for infection  We will try and let you know in a few days the results of your throat culture. If it is positive you should continue the antibiotic for a full 10 days. If it is negative you can just discontinue the amoxicillin  If you develop yeastlike symptoms then get your Diflucan prescription filled. If you do not use it just destroy it, it is not best just to keep prescriptions around.  Take Tylenol ibuprofen for the sore throat and discomfort  Use over-the-counter hydrocortisone cream on the skin rash 2 or 3 times daily. If it develops more of a fungal look with the central clearing and active border of the lesion, then get some over-the-counter clotrimazole cream or terbinafine cream and use it 2 or 3 times daily  Return if further problems     Sister Carbone, MD 10/02/2014

## 2014-10-04 LAB — CULTURE, GROUP A STREP: Organism ID, Bacteria: NORMAL

## 2014-10-13 ENCOUNTER — Encounter: Payer: Self-pay | Admitting: Dietician

## 2014-10-13 ENCOUNTER — Encounter: Payer: BLUE CROSS/BLUE SHIELD | Attending: Surgery | Admitting: Dietician

## 2014-10-13 DIAGNOSIS — Z6841 Body Mass Index (BMI) 40.0 and over, adult: Secondary | ICD-10-CM | POA: Insufficient documentation

## 2014-10-13 DIAGNOSIS — Z713 Dietary counseling and surveillance: Secondary | ICD-10-CM | POA: Diagnosis not present

## 2014-10-13 DIAGNOSIS — E669 Obesity, unspecified: Secondary | ICD-10-CM | POA: Insufficient documentation

## 2014-10-13 NOTE — Progress Notes (Signed)
  Supervised Weight Loss Visit:   Pre-Operative Sleeve Gastrectomy Surgery  Medical Nutrition Therapy:  Appt start time: 1540 end time:  1555.  Primary concerns today: Supervised Weight Loss Visit. Returns with a 1 lb weight loss. Has had a lot of stress d/t the end of the school year. 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 Has been trying to chew well and not drinking during meals.  This should be her last supervised weight loss visit since she got other appointments with her doctor. Likes Dispensing optician. Eating 3 meals a day. Still having some diet soda with caffeine and having having some coffee. Scared to get a caffeine heading.   Weight: 295.0 lbs BMI: 54.9  Patient-Centered Goals: She would like to help her entire family and improve their healthy lifestyle and increase activities.  10 level of confidence/10 level of importance   Preferred Learning Style:   No preference indicated   Learning Readiness:   Ready   Recent physical activity:  Swimming, active job  Progress Towards Goal(s):  In progress.   Nutritional Diagnosis:  [place]-3.3 Obesity related to past poor dietary habits and physical inactivity as evidenced by patient attending supervised weight loss for insurance approval of bariatric surgery.    Intervention:  Nutrition counseling provided.  Teaching Method Utilized:  Visual Auditory Hands on  Barriers to learning/adherence to lifestyle change: none  Demonstrated degree of understanding via:  Teach Back   Monitoring/Evaluation:  Dietary intake, exercise, and body weight. Follow up for Pre Op Class

## 2014-10-13 NOTE — Patient Instructions (Signed)
Keeping working on chewing well and not drinking during meal. Practice waiting 30 minutes to drink after meal. Keep phasing out caffeine and carbonation (diet soda/coffee). Try Powerade Zero.  Start eating mostly protein and vegetables. Increase water intake.

## 2014-10-16 ENCOUNTER — Telehealth: Payer: Self-pay

## 2014-10-16 DIAGNOSIS — J029 Acute pharyngitis, unspecified: Secondary | ICD-10-CM

## 2014-10-16 MED ORDER — FLUCONAZOLE 150 MG PO TABS: 150.0000 mg | ORAL_TABLET | Freq: Once | ORAL | Status: DC

## 2014-10-16 NOTE — Telephone Encounter (Signed)
Rx sent.  Pt notified. 

## 2014-10-16 NOTE — Telephone Encounter (Signed)
Can we replace? 

## 2014-10-16 NOTE — Telephone Encounter (Signed)
Yes

## 2014-10-16 NOTE — Telephone Encounter (Signed)
Patient lost her script for diflucan.  [id number] [place],  - [address]   [phone] (H)

## 2014-10-16 NOTE — Addendum Note (Signed)
Addended by: Johnnette Litter on: 10/16/2014 02:58 PM   Modules accepted: Orders

## 2014-10-17 ENCOUNTER — Other Ambulatory Visit: Payer: Self-pay | Admitting: Physician Assistant

## 2014-11-03 ENCOUNTER — Telehealth: Payer: Self-pay

## 2014-11-03 ENCOUNTER — Encounter: Payer: BLUE CROSS/BLUE SHIELD | Attending: Surgery

## 2014-11-03 DIAGNOSIS — E669 Obesity, unspecified: Secondary | ICD-10-CM | POA: Diagnosis present

## 2014-11-03 DIAGNOSIS — Z713 Dietary counseling and surveillance: Secondary | ICD-10-CM | POA: Insufficient documentation

## 2014-11-03 DIAGNOSIS — Z6841 Body Mass Index (BMI) 40.0 and over, adult: Secondary | ICD-10-CM | POA: Diagnosis not present

## 2014-11-03 NOTE — Telephone Encounter (Signed)
Pt states she saw dr hopper for what appeared to be ringworm,it has come back and she Needs RX for same,she is also requesting Tramadol refill  Best phone for pt is [phone]   [facility] [address]

## 2014-11-04 NOTE — Telephone Encounter (Signed)
It should not have returned this quickly. Needs to be rechecked. Can discuss the tramadol need at that time. She should come back in.

## 2014-11-06 ENCOUNTER — Other Ambulatory Visit: Payer: Self-pay | Admitting: Surgery

## 2014-11-06 ENCOUNTER — Other Ambulatory Visit: Payer: Self-pay | Admitting: Family Medicine

## 2014-11-06 MED ORDER — TRAMADOL HCL 50 MG PO TABS: 50.0000 mg | ORAL_TABLET | Freq: Three times a day (TID) | ORAL | Status: DC | PRN

## 2014-11-06 NOTE — Telephone Encounter (Signed)
Rx called in to [facility] [address]. Left voicemail letting pt know.

## 2014-11-06 NOTE — Telephone Encounter (Signed)
I have refilled her Ultram

## 2014-11-06 NOTE — Telephone Encounter (Signed)
Spoke with pt, she is going out of town today. She would like a partial refill until she sees Deliah BostonMichael Clark on the 18th at 3:00pm. Can someone help? Dr Alwyn RenHopper is not here until 5:00

## 2014-11-07 ENCOUNTER — Other Ambulatory Visit: Payer: Self-pay | Admitting: Family Medicine

## 2014-11-07 NOTE — Progress Notes (Signed)
  Pre-Operative Nutrition Class:  Appt start time: 7824   End time:  1830.  Patient was seen on 11/03/2014 for Pre-Operative Bariatric Surgery Education at [facility].   Surgery date:  Surgery type: Sleeve Gastrectomy Start weight at [facility]: 296 lbs on 09/10/14 Weight today: 294.5 lbs  TANITA  BODY COMP RESULTS  11/03/14   BMI (kg/m^2) 53.9   Fat Mass (lbs) 157   Fat Free Mass (lbs) 137.5   Total Body Water (lbs) 100.5   Samples given per MNT protocol. Patient educated on appropriate usage: Unjury protein powder (qty 1 - chicken soup) Lot #: [id number] Exp: 10/2015  Celebrate Vitamins Multivitamin (qty 1 - orange) Lot #: [id number] Exp: 06/2016  Celebrate Vitamins Calcium Citrate chew (qty 1 -chocolate) Lot #: [id number] Exp: 06/2016  Premier protein shake (qty 1 - strawberry) Lot #: [id number] Exp: 12/2014    The following the learning objectives were met by the patient during this course:  Identify Pre-Op Dietary Goals and will begin 2 weeks pre-operatively  Identify appropriate sources of fluids and proteins   State protein recommendations and appropriate sources pre and post-operatively  Identify Post-Operative Dietary Goals and will follow for 2 weeks post-operatively  Identify appropriate multivitamin and calcium sources  Describe the need for physical activity post-operatively and will follow MD recommendations  State when to call healthcare provider regarding medication questions or post-operative complications  Handouts given during class include:  Pre-Op Bariatric Surgery Diet Handout  Protein Shake Handout  Post-Op Bariatric Surgery Nutrition Handout  BELT Program Information Flyer  Support Group Information Flyer  [facility] Bariatric Supplements Price List  Follow-Up Plan: Patient will follow-up at [facility] 2 weeks post operatively for diet advancement per MD.

## 2014-11-10 ENCOUNTER — Ambulatory Visit: Payer: BLUE CROSS/BLUE SHIELD | Admitting: Dietician

## 2014-11-10 ENCOUNTER — Ambulatory Visit: Payer: Self-pay | Admitting: Physician Assistant

## 2014-11-13 ENCOUNTER — Encounter (HOSPITAL_COMMUNITY)
Admission: RE | Admit: 2014-11-13 | Discharge: 2014-11-13 | Disposition: A | Discharge status: Home / Self Care | Payer: BLUE CROSS/BLUE SHIELD | Source: Ambulatory Visit | Attending: Surgery | Admitting: Surgery

## 2014-11-13 ENCOUNTER — Encounter (HOSPITAL_COMMUNITY): Payer: Self-pay

## 2014-11-13 DIAGNOSIS — Z01812 Encounter for preprocedural laboratory examination: Secondary | ICD-10-CM | POA: Diagnosis not present

## 2014-11-13 DIAGNOSIS — Z6841 Body Mass Index (BMI) 40.0 and over, adult: Secondary | ICD-10-CM | POA: Diagnosis not present

## 2014-11-13 HISTORY — DX: Personal history of other infectious and parasitic diseases: Z86.19

## 2014-11-13 HISTORY — DX: Reserved for inherently not codable concepts without codable children: IMO0001

## 2014-11-13 HISTORY — DX: Personal history of other diseases of the digestive system: Z87.19

## 2014-11-13 HISTORY — DX: Anesthesia of skin: R20.0

## 2014-11-13 LAB — CBC
HCT: 44.7 % (ref 36.0–46.0)
Hemoglobin: 15.3 g/dL — ABNORMAL HIGH (ref 12.0–15.0)
MCH: 29.8 pg (ref 26.0–34.0)
MCHC: 34.2 g/dL (ref 30.0–36.0)
MCV: 87 fL (ref 78.0–100.0)
Platelets: 362 10*3/uL (ref 150–400)
RBC: 5.14 MIL/uL — ABNORMAL HIGH (ref 3.87–5.11)
RDW: 12.2 % (ref 11.5–15.5)
WBC: 9.3 10*3/uL (ref 4.0–10.5)

## 2014-11-13 LAB — BASIC METABOLIC PANEL
Anion gap: 7 (ref 5–15)
BUN: 16 mg/dL (ref 6–20)
CO2: 25 mmol/L (ref 22–32)
Calcium: 9.1 mg/dL (ref 8.9–10.3)
Chloride: 107 mmol/L (ref 101–111)
Creatinine, Ser: 0.47 mg/dL (ref 0.44–1.00)
GFR calc Af Amer: >60 mL/min (ref >60)
GFR calc non Af Amer: >60 mL/min (ref >60)
Glucose, Bld: 104 mg/dL — ABNORMAL HIGH (ref 65–99)
Potassium: 3.9 mmol/L (ref 3.5–5.1)
Sodium: 139 mmol/L (ref 135–145)

## 2014-11-13 LAB — HCG, SERUM, QUALITATIVE: Preg, Serum: NEGATIVE

## 2014-11-13 NOTE — Patient Instructions (Addendum)
Ann Santos  11/13/2014   Your procedure is scheduled on: Monday November 17, 2014   Report to [facility] Main  Entrance take [place]  elevators to 3rd floor to  Short Stay Center at 7:30 AM.  Call this number if you have problems the morning of surgery [phone]   Remember: ONLY 1 PERSON MAY GO WITH YOU TO SHORT STAY TO GET  READY MORNING OF YOUR SURGERY.  Do not eat food or drink liquids :After Midnight.     Take these medicines the morning of surgery with A SIP OF WATER: Ceterizine (Zyrtec) if needed; Flonase if needed                                You may not have any metal on your body including hair pins and              piercings  Do not wear jewelry, make-up, lotions, powders or perfumes, deodorant             Do not wear nail polish.  Do not shave  48 hours prior to surgery.               Do not bring valuables to the hospital. [facility] IS NOT             RESPONSIBLE   FOR VALUABLES.  Contacts, dentures or bridgework may not be worn into surgery.  Leave suitcase in the car. After surgery it may be brought to your room.     _____________________________________________________________________             [facility] - Preparing for Surgery Before surgery, you can play an important role.  Because skin is not sterile, your skin needs to be as free of germs as possible.  You can reduce the number of germs on your skin by washing with CHG (chlorahexidine gluconate) soap before surgery.  CHG is an antiseptic cleaner which kills germs and bonds with the skin to continue killing germs even after washing. Please DO NOT use if you have an allergy to CHG or antibacterial soaps.  If your skin becomes reddened/irritated stop using the CHG and inform your nurse when you arrive at Short Stay. Do not shave (including legs and underarms) for at least 48 hours prior to the first CHG shower.  You may shave your face/neck. Please follow these instructions  carefully:  1.  Shower with CHG Soap the night before surgery and the  morning of Surgery.  2.  If you choose to wash your hair, wash your hair first as usual with your  normal  shampoo.  3.  After you shampoo, rinse your hair and body thoroughly to remove the  shampoo.                           4.  Use CHG as you would any other liquid soap.  You can apply chg directly  to the skin and wash                       Gently with a scrungie or clean washcloth.  5.  Apply the CHG Soap to your body ONLY FROM THE NECK DOWN.   Do not use on face/ open  Wound or open sores. Avoid contact with eyes, ears mouth and genitals (private parts).                       Wash face,  Genitals (private parts) with your normal soap.             6.  Wash thoroughly, paying special attention to the area where your surgery  will be performed.  7.  Thoroughly rinse your body with warm water from the neck down.  8.  DO NOT shower/wash with your normal soap after using and rinsing off  the CHG Soap.                9.  Pat yourself dry with a clean towel.            10.  Wear clean pajamas.            11.  Place clean sheets on your bed the night of your first shower and do not  sleep with pets. Day of Surgery : Do not apply any lotions/deodorants the morning of surgery.  Please wear clean clothes to the hospital/surgery center.  FAILURE TO FOLLOW THESE INSTRUCTIONS MAY RESULT IN THE CANCELLATION OF YOUR SURGERY PATIENT SIGNATURE_________________________________  NURSE SIGNATURE__________________________________  ________________________________________________________________________

## 2014-11-13 NOTE — Progress Notes (Addendum)
Spoke with Dr Salena Saner Moser/anesthesia in regards to pt taking phentermine. Discussed that pt stated her last dose was around 11/03/2014. No new orders per anesthesia. Anesthesia to see pt day of surgery. Dr Maple Hudson / anesthesia also reviewed pts EKG from 07/21/2014 (epic). Again anesthesia to see pt day of surgery.

## 2014-11-16 NOTE — H&P (Signed)
Ann Santos 11/13/2014 11:37 AM Location: [facility] Patient #: [id number] DOB: May 17, 1980 Married / Language: English / Race: White Female  History of Present Illness  Patient words: preop bari.   The patient is a 34 year old female who presents for a bariatric surgery evaluation.  She sees Dr. Dory Horn as her PCP. She sees Dr. Shea Evans from [facility] and she has helped her more with the weight loss. She comes by herself.   There was a mix up in scheduling - but she is now on for Monday, 7/25. She has lost about 12 pounds on the diet, which is good. Again, we reviewed the sleeve gastrectomy. I reviewed the post op course. She knows about walking from her C section. I also reviewed with her the hiatal hernia. That, if found, I will repair this. It adds time and risk to the surgery, but I think necessary.  She is on the pre op diet. I gave her her post op pain meds.  UGI - 07/21/2014 - small HH [place] - 07/21/2014 - normal saw Dr. Cyndia Skeeters - 08/06/2014 - approved for surgery  History of weight issues; Her intial weight is 296. Her BMI is 54.4 She heard me at an information session. She is interested in a sleeve gastrectomy. One of her coworkers had a sleeve gastrectomy in [place] and has done well.  She has been obese her entire life. She's tried multiple diets, including: Weight Watchers, Vickey Sages, Doylene Bode, [place], and Slim fast. She is also tried [employer], since was expensive. She tried a bariatric diet included B12, but this was expensive. She has some Adipex to try. She is concerned about losing too much weight, I thought this would not be a problem and told her this. She says that psychologically "she just likes to eat".   Per the 1991 NIH Consensus Statement, the patient is a candidate for bariatric surgery. The patient attended our initial information session and reviewed the types of bariatric surgery.  The  patient is interested in the sleeve gastrectomy. I discussed with the patient the indications and risks of bariatric surgery. The potential risks of surgery include, but are not limited to, bleeding, infection, leak from the bowel, DVT and PE, open surgery, long term nutrition consequences, and death. The patient understands the importance of compliance and long term follow-up with our group after surgery.  Past Medical History 1. Preeclampsia with pregnancy. No hypertension since delivery 2. Right worse that left heel spur She goes to [facility] - she had a cortison injection which helped some 3. C section for both children 4. Umbilical hernia by dr. Gerrit Friends - 11/11/2011 5. Colonoscopy after umbiilcal hernia repair for some GI symptoms - P. Hung 6. Saw Dr. Cyndia Skeeters  Social History Married. Her husband does Airline pilot at [employer]. 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 She is still nursing.   Allergies (Ammie Eversole, LPN; 5/78/4696 29:52 AM) No Known Drug Allergies03/06/2014  Medication History (Ammie Eversole, LPN; 8/41/3244 01:02 AM) TraZODone HCl (  Tablet, Oral daily) Active. Zoloft (  Tablet, Oral daily) Active. Probiotic (Oral daily) Active. Medications Reconciled  Review of Systems Onalee Hua H. Ezzard Standing MD; 11/13/2014 11:40 AM) General Not Present- Appetite Loss, Chills, Fatigue, Fever, Night Sweats, Weight Gain and Weight Loss. Skin Not Present- Change in Wart/Mole, Dryness, Hives, Jaundice, New Lesions, Non-Healing Wounds, Rash and Ulcer. HEENT Present-  Seasonal Allergies. Not Present- Earache, Hearing Loss, Hoarseness, Nose Bleed, Oral Ulcers, Ringing in the Ears, Sinus Pain, Sore Throat, Visual Disturbances, Wears glasses/contact lenses and Yellow Eyes. Respiratory Not Present- Bloody sputum, Chronic Cough, Difficulty Breathing, Snoring and Wheezing. Breast  Not Present- Breast Mass, Breast Pain, Nipple Discharge and Skin Changes. Cardiovascular Not Present- Chest Pain, Difficulty Breathing Lying Down, Leg Cramps, Palpitations, Rapid Heart Rate, Shortness of Breath and Swelling of Extremities. Gastrointestinal Present- Hemorrhoids. Not Present- Abdominal Pain, Bloating, Bloody Stool, Change in Bowel Habits, Chronic diarrhea, Constipation, Difficulty Swallowing, Excessive gas, Gets full quickly at meals, Indigestion, Nausea, Rectal Pain and Vomiting. Female Genitourinary Not Present- Frequency, Nocturia, Painful Urination, Pelvic Pain and Urgency. Neurological Not Present- Decreased Memory, Fainting, Headaches, Numbness, Seizures, Tingling, Tremor, Trouble walking and Weakness. Psychiatric Present- Anxiety. Not Present- Bipolar, Change in Sleep Pattern, Depression, Fearful and Frequent crying. Endocrine Not Present- Cold Intolerance, Excessive Hunger, Hair Changes, Heat Intolerance, Hot flashes and New Diabetes. Hematology Not Present- Easy Bruising, Excessive bleeding, Gland problems, HIV and Persistent Infections.   Vitals (Ammie Eversole LPN; 1/61/0960 45:40 AM) 11/13/2014 11:37 AM Weight: 284.4 lb Height: 62in Body Surface Area: 2.38 m Body Mass Index: 52.02 kg/m Temp.: 98.65F(Oral)  Pulse: 82 (Regular)  Resp.: 12 (Unlabored)  BP: 132/82 (Sitting, Left Arm, Standard)    Physical Exam Onalee Hua H. Ezzard Standing MD; 11/13/2014 12:32 PM) The physical exam findings are as follows: Note:General: Obese WF alert and generally healthy appearing. HEENT: Normal. Pupils equal. Good dentition.  Neck: Supple. No mass. No thyroid mass.  Lymph Nodes: No supraclavicular or cervical nodes.  Lungs: Clear to auscultation and symmetric breath sounds. Heart: RRR. No murmur or rub.  Abdomen: Soft. No mass. No tenderness. No hernia. Normal bowel sounds.  Pfanennstiel incision (this was revised once) and umbilical incision (from Dr. Gerrit Friends)  She has a moderate pannus.She is more apple that pear.  Extremities: Good strength and ROM in upper and lower extremities.  Neurologic: Grossly intact to motor and sensory function. Psychiatric: Has normal mood and affect. Behavior is normal.    Assessment & Plan Onalee Hua H. Ezzard Standing MD;  1.  MORBID OBESITY WITH BMI OF 50.0-59.9, ADULT (278.01  E66.01)  Impression: She is scheduled for a sleeve gastrectomy on Monday, 7/25.  We discussed possible hiatal hernia repair.  Given OxyCODONE HCl 5MG /5ML, 5-10 Milliliter every six hours, as needed, 200 Milliliter, 11/13/2014, No Refill.  This is for post op pain management.   2.  Small Hiatal hernia on UGI  3. Preeclampsia with pregnancy. No hypertension since delivery 4. Right worse that left heel spur She goes to [facility] - she had a cortison injection which helped some 5. Umbilical hernia by Dr. Gerrit Friends - 11/11/2011 6. Colonoscopy after umbiilcal hernia repair for some GI symptoms - P. Nicholos Johns, MD, [facility] Pager: [phone] Office phone:  [phone]

## 2014-11-17 ENCOUNTER — Other Ambulatory Visit: Payer: Self-pay | Admitting: Physician Assistant

## 2014-11-17 ENCOUNTER — Encounter (HOSPITAL_COMMUNITY)
Admission: RE | Disposition: A | Discharge status: Home / Self Care | Payer: Self-pay | Source: Ambulatory Visit | Attending: Surgery

## 2014-11-17 ENCOUNTER — Inpatient Hospital Stay (HOSPITAL_COMMUNITY): Payer: BLUE CROSS/BLUE SHIELD | Admitting: Registered Nurse

## 2014-11-17 ENCOUNTER — Encounter (HOSPITAL_COMMUNITY): Payer: Self-pay | Admitting: *Deleted

## 2014-11-17 ENCOUNTER — Inpatient Hospital Stay (HOSPITAL_COMMUNITY)
Admission: RE | Admit: 2014-11-17 | Discharge: 2014-11-19 | DRG: 621 | Disposition: A | Discharge status: Home / Self Care | Payer: BLUE CROSS/BLUE SHIELD | Source: Ambulatory Visit | Attending: Surgery | Admitting: Surgery

## 2014-11-17 DIAGNOSIS — I10 Essential (primary) hypertension: Secondary | ICD-10-CM | POA: Diagnosis present

## 2014-11-17 DIAGNOSIS — Z903 Acquired absence of stomach [part of]: Secondary | ICD-10-CM

## 2014-11-17 DIAGNOSIS — Z6841 Body Mass Index (BMI) 40.0 and over, adult: Secondary | ICD-10-CM | POA: Diagnosis not present

## 2014-11-17 DIAGNOSIS — K439 Ventral hernia without obstruction or gangrene: Secondary | ICD-10-CM | POA: Diagnosis present

## 2014-11-17 DIAGNOSIS — M7732 Calcaneal spur, left foot: Secondary | ICD-10-CM | POA: Diagnosis present

## 2014-11-17 DIAGNOSIS — K449 Diaphragmatic hernia without obstruction or gangrene: Secondary | ICD-10-CM | POA: Diagnosis present

## 2014-11-17 HISTORY — PX: LAPAROSCOPIC GASTRIC SLEEVE RESECTION WITH HIATAL HERNIA REPAIR: SHX6512

## 2014-11-17 LAB — HEMOGLOBIN AND HEMATOCRIT, BLOOD
HCT: 40.9 % (ref 36.0–46.0)
Hemoglobin: 13.5 g/dL (ref 12.0–15.0)

## 2014-11-17 SURGERY — GASTRECTOMY, SLEEVE, LAPAROSCOPIC, WITH HIATAL HERNIA REPAIR
Anesthesia: General | Site: Abdomen

## 2014-11-17 MED ORDER — PROMETHAZINE HCL 25 MG/ML IJ SOLN
6.2500 mg | INTRAMUSCULAR | Status: AC | PRN
  Administered 2014-11-17 (×2): 6.25 mg via INTRAVENOUS

## 2014-11-17 MED ORDER — PROPOFOL 10 MG/ML IV BOLUS
INTRAVENOUS | Status: DC | PRN
  Administered 2014-11-17: 250 mg via INTRAVENOUS

## 2014-11-17 MED ORDER — EPHEDRINE SULFATE 50 MG/ML IJ SOLN
INTRAMUSCULAR | Status: DC | PRN
  Administered 2014-11-17: 5 mg via INTRAVENOUS

## 2014-11-17 MED ORDER — TISSEEL VH 10 ML EX KIT
PACK | CUTANEOUS | Status: DC | PRN
  Administered 2014-11-17: 1

## 2014-11-17 MED ORDER — PROPOFOL 10 MG/ML IV BOLUS
INTRAVENOUS | Status: AC
  Filled 2014-11-17: qty 20

## 2014-11-17 MED ORDER — MIDAZOLAM HCL 2 MG/2ML IJ SOLN
INTRAMUSCULAR | Status: AC
Start: 2014-11-17 — End: 2014-11-17
  Filled 2014-11-17: qty 2

## 2014-11-17 MED ORDER — DEXTROSE 5 % IV SOLN
2.0000 g | INTRAVENOUS | Status: AC
  Administered 2014-11-17: 2 g via INTRAVENOUS

## 2014-11-17 MED ORDER — GLYCOPYRROLATE 0.2 MG/ML IJ SOLN
INTRAMUSCULAR | Status: DC | PRN
  Administered 2014-11-17: .8 mg via INTRAVENOUS

## 2014-11-17 MED ORDER — DEXAMETHASONE SODIUM PHOSPHATE 10 MG/ML IJ SOLN
INTRAMUSCULAR | Status: AC
  Filled 2014-11-17: qty 1

## 2014-11-17 MED ORDER — ROCURONIUM BROMIDE 100 MG/10ML IV SOLN
INTRAVENOUS | Status: DC | PRN
  Administered 2014-11-17: 40 mg via INTRAVENOUS
  Administered 2014-11-17: 10 mg via INTRAVENOUS

## 2014-11-17 MED ORDER — MORPHINE SULFATE 2 MG/ML IJ SOLN
2.0000 mg | INTRAMUSCULAR | Status: DC | PRN
  Administered 2014-11-17 – 2014-11-18 (×9): 4 mg via INTRAVENOUS
  Filled 2014-11-17 (×9): qty 2

## 2014-11-17 MED ORDER — POTASSIUM CHLORIDE IN NACL 20-0.45 MEQ/L-% IV SOLN
INTRAVENOUS | Status: DC
Start: 2014-11-17 — End: 2014-11-19
  Administered 2014-11-17: 1000 mL via INTRAVENOUS
  Administered 2014-11-17: 14:00:00 via INTRAVENOUS
  Administered 2014-11-18: 1000 mL via INTRAVENOUS
  Administered 2014-11-18: 17:00:00 via INTRAVENOUS
  Administered 2014-11-19: 1000 mL via INTRAVENOUS
  Filled 2014-11-17 (×10): qty 1000

## 2014-11-17 MED ORDER — UNJURY VANILLA POWDER
2.0000 [oz_av] | Freq: Four times a day (QID) | ORAL | Status: DC
  Administered 2014-11-19: 2 [oz_av] via ORAL

## 2014-11-17 MED ORDER — HYDROMORPHONE HCL 1 MG/ML IJ SOLN
INTRAMUSCULAR | Status: AC
  Filled 2014-11-17: qty 1

## 2014-11-17 MED ORDER — DEXAMETHASONE SODIUM PHOSPHATE 10 MG/ML IJ SOLN
INTRAMUSCULAR | Status: DC | PRN
  Administered 2014-11-17: 10 mg via INTRAVENOUS

## 2014-11-17 MED ORDER — ACETAMINOPHEN 160 MG/5ML PO SOLN: 325.0000 mg | ORAL | Status: DC | PRN

## 2014-11-17 MED ORDER — LACTATED RINGERS IR SOLN
Status: DC | PRN
Start: 2014-11-17 — End: 2014-11-17
  Administered 2014-11-17 (×2): 1000 mL

## 2014-11-17 MED ORDER — ONDANSETRON HCL 4 MG/2ML IJ SOLN
INTRAMUSCULAR | Status: AC
  Filled 2014-11-17: qty 2

## 2014-11-17 MED ORDER — GLYCOPYRROLATE 0.2 MG/ML IJ SOLN
INTRAMUSCULAR | Status: AC
  Filled 2014-11-17: qty 4

## 2014-11-17 MED ORDER — ONDANSETRON HCL 4 MG/2ML IJ SOLN
4.0000 mg | INTRAMUSCULAR | Status: DC | PRN
  Administered 2014-11-17 – 2014-11-18 (×6): 4 mg via INTRAVENOUS
  Filled 2014-11-17 (×5): qty 2

## 2014-11-17 MED ORDER — NEOSTIGMINE METHYLSULFATE 10 MG/10ML IV SOLN
INTRAVENOUS | Status: AC
  Filled 2014-11-17: qty 1

## 2014-11-17 MED ORDER — BUPIVACAINE-EPINEPHRINE 0.25% -1:200000 IJ SOLN
INTRAMUSCULAR | Status: DC | PRN
  Administered 2014-11-17: 30 mL

## 2014-11-17 MED ORDER — SUCCINYLCHOLINE CHLORIDE 20 MG/ML IJ SOLN
INTRAMUSCULAR | Status: DC | PRN
  Administered 2014-11-17: 100 mg via INTRAVENOUS

## 2014-11-17 MED ORDER — 0.9 % SODIUM CHLORIDE (POUR BTL) OPTIME
TOPICAL | Status: DC | PRN
  Administered 2014-11-17: 1000 mL

## 2014-11-17 MED ORDER — NEOSTIGMINE METHYLSULFATE 10 MG/10ML IV SOLN
INTRAVENOUS | Status: DC | PRN
  Administered 2014-11-17: 5 mg via INTRAVENOUS

## 2014-11-17 MED ORDER — LACTATED RINGERS IV SOLN: INTRAVENOUS | Status: DC

## 2014-11-17 MED ORDER — UNJURY CHOCOLATE CLASSIC POWDER: 2.0000 [oz_av] | Freq: Four times a day (QID) | ORAL | Status: DC

## 2014-11-17 MED ORDER — LACTATED RINGERS IV SOLN
INTRAVENOUS | Status: DC | PRN
  Administered 2014-11-17: 09:00:00 via INTRAVENOUS

## 2014-11-17 MED ORDER — PHENYLEPHRINE 40 MCG/ML (10ML) SYRINGE FOR IV PUSH (FOR BLOOD PRESSURE SUPPORT)
PREFILLED_SYRINGE | INTRAVENOUS | Status: AC
  Filled 2014-11-17: qty 10

## 2014-11-17 MED ORDER — HYDROMORPHONE HCL 1 MG/ML IJ SOLN
0.2500 mg | INTRAMUSCULAR | Status: DC | PRN
  Administered 2014-11-17 (×4): 0.5 mg via INTRAVENOUS

## 2014-11-17 MED ORDER — MIDAZOLAM HCL 5 MG/5ML IJ SOLN
INTRAMUSCULAR | Status: DC | PRN
  Administered 2014-11-17: 2 mg via INTRAVENOUS

## 2014-11-17 MED ORDER — OXYCODONE HCL 5 MG/5ML PO SOLN
5.0000 mg | ORAL | Status: DC | PRN
Start: 2014-11-18 — End: 2014-11-19
  Administered 2014-11-18 – 2014-11-19 (×4): 10 mg via ORAL
  Filled 2014-11-17 (×4): qty 10

## 2014-11-17 MED ORDER — ROCURONIUM BROMIDE 100 MG/10ML IV SOLN
INTRAVENOUS | Status: AC
  Filled 2014-11-17: qty 1

## 2014-11-17 MED ORDER — HEPARIN SODIUM (PORCINE) 5000 UNIT/ML IJ SOLN
5000.0000 [IU] | Freq: Once | INTRAMUSCULAR | Status: AC
  Administered 2014-11-17: 5000 [IU] via SUBCUTANEOUS
  Filled 2014-11-17: qty 1

## 2014-11-17 MED ORDER — FENTANYL CITRATE (PF) 250 MCG/5ML IJ SOLN
INTRAMUSCULAR | Status: AC
  Filled 2014-11-17: qty 5

## 2014-11-17 MED ORDER — CHLORHEXIDINE GLUCONATE 4 % EX LIQD: 1.0000 "application " | Freq: Once | CUTANEOUS | Status: DC

## 2014-11-17 MED ORDER — TISSEEL VH 10 ML EX KIT
PACK | CUTANEOUS | Status: AC
  Filled 2014-11-17: qty 2

## 2014-11-17 MED ORDER — FENTANYL CITRATE (PF) 100 MCG/2ML IJ SOLN
INTRAMUSCULAR | Status: DC | PRN
  Administered 2014-11-17 (×7): 50 ug via INTRAVENOUS

## 2014-11-17 MED ORDER — PROMETHAZINE HCL 25 MG/ML IJ SOLN
6.2500 mg | INTRAMUSCULAR | Status: AC | PRN
Start: 2014-11-17 — End: 2014-11-17
  Administered 2014-11-17 (×2): 6.25 mg via INTRAVENOUS

## 2014-11-17 MED ORDER — PROMETHAZINE HCL 25 MG/ML IJ SOLN
INTRAMUSCULAR | Status: AC
  Filled 2014-11-17: qty 1

## 2014-11-17 MED ORDER — ONDANSETRON HCL 4 MG/2ML IJ SOLN
INTRAMUSCULAR | Status: DC | PRN
  Administered 2014-11-17: 4 mg via INTRAVENOUS

## 2014-11-17 MED ORDER — HEPARIN SODIUM (PORCINE) 5000 UNIT/ML IJ SOLN
5000.0000 [IU] | Freq: Three times a day (TID) | INTRAMUSCULAR | Status: DC
Start: 2014-11-17 — End: 2014-11-19
  Administered 2014-11-17 – 2014-11-19 (×6): 5000 [IU] via SUBCUTANEOUS
  Filled 2014-11-17 (×9): qty 1

## 2014-11-17 MED ORDER — FENTANYL CITRATE (PF) 100 MCG/2ML IJ SOLN
INTRAMUSCULAR | Status: AC
  Filled 2014-11-17: qty 2

## 2014-11-17 MED ORDER — DEXTROSE 5 % IV SOLN
INTRAVENOUS | Status: AC
  Filled 2014-11-17: qty 2

## 2014-11-17 MED ORDER — LIDOCAINE HCL (CARDIAC) 20 MG/ML IV SOLN
INTRAVENOUS | Status: DC | PRN
  Administered 2014-11-17: 100 mg via INTRAVENOUS

## 2014-11-17 MED ORDER — PHENYLEPHRINE HCL 10 MG/ML IJ SOLN
INTRAMUSCULAR | Status: DC | PRN
  Administered 2014-11-17: 40 ug via INTRAVENOUS

## 2014-11-17 MED ORDER — UNJURY CHICKEN SOUP POWDER: 2.0000 [oz_av] | Freq: Four times a day (QID) | ORAL | Status: DC

## 2014-11-17 MED ORDER — ACETAMINOPHEN 160 MG/5ML PO SOLN: 650.0000 mg | ORAL | Status: DC | PRN

## 2014-11-17 MED ORDER — BUPIVACAINE-EPINEPHRINE (PF) 0.25% -1:200000 IJ SOLN
INTRAMUSCULAR | Status: AC
  Filled 2014-11-17: qty 30

## 2014-11-17 MED ORDER — LIDOCAINE HCL (CARDIAC) 20 MG/ML IV SOLN
INTRAVENOUS | Status: AC
  Filled 2014-11-17: qty 5

## 2014-11-17 MED ORDER — HYDROMORPHONE HCL 1 MG/ML IJ SOLN
INTRAMUSCULAR | Status: AC
Start: 2014-11-17 — End: 2014-11-18
  Filled 2014-11-17: qty 1

## 2014-11-17 SURGICAL SUPPLY — 64 items
APPLICATOR COTTON TIP 6IN STRL (MISCELLANEOUS) IMPLANT
APPLIER CLIP ROT 10 11.4 M/L (STAPLE) ×2
APPLIER CLIP ROT 13.4 12 LRG (CLIP)
BLADE SURG 15 STRL LF DISP TIS (BLADE) ×1 IMPLANT
BLADE SURG 15 STRL SS (BLADE) ×1
CABLE HIGH FREQUENCY MONO STRZ (ELECTRODE) ×2 IMPLANT
CHLORAPREP W/TINT 26ML (MISCELLANEOUS) ×4 IMPLANT
CLIP APPLIE ROT 10 11.4 M/L (STAPLE) ×1 IMPLANT
CLIP APPLIE ROT 13.4 12 LRG (CLIP) IMPLANT
COVER SURGICAL LIGHT HANDLE (MISCELLANEOUS) IMPLANT
DEVICE SUTURE ENDOST 10MM (ENDOMECHANICALS) IMPLANT
DEVICE TROCAR PUNCTURE CLOSURE (ENDOMECHANICALS) IMPLANT
DISSECTOR BLUNT TIP ENDO 5MM (MISCELLANEOUS) IMPLANT
DRAPE CAMERA CLOSED 9X96 (DRAPES) ×2 IMPLANT
DRAPE UTILITY XL STRL (DRAPES) ×4 IMPLANT
ELECT REM PT RETURN 9FT ADLT (ELECTROSURGICAL) ×2
ELECTRODE REM PT RTRN 9FT ADLT (ELECTROSURGICAL) ×1 IMPLANT
GAUZE SPONGE 4X4 12PLY STRL (GAUZE/BANDAGES/DRESSINGS) IMPLANT
GLOVE BIOGEL M 8.0 STRL (GLOVE) ×2 IMPLANT
GLOVE BIOGEL PI IND STRL 6.5 (GLOVE) ×1 IMPLANT
GLOVE BIOGEL PI IND STRL 7.0 (GLOVE) ×2 IMPLANT
GLOVE BIOGEL PI INDICATOR 6.5 (GLOVE) ×1
GLOVE BIOGEL PI INDICATOR 7.0 (GLOVE) ×2
GLOVE SURG SIGNA 7.5 PF LTX (GLOVE) ×2 IMPLANT
GLOVE SURG SS PI 6.5 STRL IVOR (GLOVE) ×2 IMPLANT
GOWN STRL REUS W/ TWL LRG LVL3 (GOWN DISPOSABLE) ×1 IMPLANT
GOWN STRL REUS W/TWL LRG LVL3 (GOWN DISPOSABLE) ×1
GOWN STRL REUS W/TWL XL LVL3 (GOWN DISPOSABLE) ×6 IMPLANT
HOVERMATT SINGLE USE (MISCELLANEOUS) ×2 IMPLANT
KIT BASIN OR (CUSTOM PROCEDURE TRAY) ×2 IMPLANT
LIQUID BAND (GAUZE/BANDAGES/DRESSINGS) ×2 IMPLANT
NEEDLE SPNL 22GX3.5 QUINCKE BK (NEEDLE) ×2 IMPLANT
PACK UNIVERSAL I (CUSTOM PROCEDURE TRAY) ×2 IMPLANT
PEN SKIN MARKING BROAD (MISCELLANEOUS) ×2 IMPLANT
RELOAD STAPLER BLUE 60MM (STAPLE) ×3 IMPLANT
RELOAD STAPLER GOLD 60MM (STAPLE) ×1 IMPLANT
RELOAD STAPLER GREEN 60MM (STAPLE) ×2 IMPLANT
SCISSORS LAP 5X35 DISP (ENDOMECHANICALS) IMPLANT
SEALANT SURGICAL APPL DUAL CAN (MISCELLANEOUS) ×2 IMPLANT
SET IRRIG TUBING LAPAROSCOPIC (IRRIGATION / IRRIGATOR) ×2 IMPLANT
SHEARS CURVED HARMONIC AC 45CM (MISCELLANEOUS) ×2 IMPLANT
SLEEVE ADV FIXATION 5X100MM (TROCAR) ×2 IMPLANT
SLEEVE GASTRECTOMY 36FR VISIGI (MISCELLANEOUS) ×2 IMPLANT
SOLUTION ANTI FOG 6CC (MISCELLANEOUS) ×2 IMPLANT
SPONGE LAP 18X18 X RAY DECT (DISPOSABLE) ×2 IMPLANT
STAPLER ECHELON LONG 60 440 (INSTRUMENTS) ×2 IMPLANT
STAPLER RELOAD BLUE 60MM (STAPLE) ×6
STAPLER RELOAD GOLD 60MM (STAPLE) ×2
STAPLER RELOAD GREEN 60MM (STAPLE) ×4
SUT ETHILON 2 0 PS N (SUTURE) IMPLANT
SUT MNCRL AB 4-0 PS2 18 (SUTURE) ×2 IMPLANT
SYR 10ML ECCENTRIC (SYRINGE) ×2 IMPLANT
SYR 20CC LL (SYRINGE) ×2 IMPLANT
TOWEL OR 17X26 10 PK STRL BLUE (TOWEL DISPOSABLE) ×2 IMPLANT
TOWEL OR NON WOVEN STRL DISP B (DISPOSABLE) ×2 IMPLANT
TRAY FOLEY W/METER SILVER 14FR (SET/KITS/TRAYS/PACK) ×2 IMPLANT
TROCAR ADV FIXATION 12X100MM (TROCAR) ×2 IMPLANT
TROCAR ADV FIXATION 5X100MM (TROCAR) ×2 IMPLANT
TROCAR BLADELESS 15MM (ENDOMECHANICALS) ×2 IMPLANT
TROCAR BLADELESS OPT 5 100 (ENDOMECHANICALS) ×2 IMPLANT
TROCAR XCEL 12X100 BLDLESS (ENDOMECHANICALS) ×2 IMPLANT
TUBING CONNECTING 10 (TUBING) ×4 IMPLANT
TUBING ENDO SMARTCAP PENTAX (MISCELLANEOUS) ×2 IMPLANT
TUBING FILTER THERMOFLATOR (ELECTROSURGICAL) ×2 IMPLANT

## 2014-11-17 NOTE — Op Note (Addendum)
PATIENT:   Ann Santos DOB:   [birth year] MRN:   [id number]  DATE OF PROCEDURE: 11/17/2014                   FACILITY:  [facility]  OPERATIVE REPORT  PREOPERATIVE DIAGNOSIS:  Morbid obesity.  POSTOPERATIVE DIAGNOSIS:  Morbid obesity (weight 296, BMI of 54.4), recurrent ventral abdominal wall hernia at prior umbilical incision.  PROCEDURE:  Laparoscopic Sleeve gastrectomy (intraoperative upper endoscopy by Dr. Andrey Campanile)  SURGEON:  Sandria Bales. Ezzard Standing, MD  FIRST ASSISTANTFredonia Highland, MD  ANESTHESIA:  General endotracheal.  Anesthesiologist: Sherrian Divers, MD CRNA: Elisabeth Cara, CRNA  General  ESTIMATED BLOOD LOSS:  Minimal.  LOCAL ANESTHESIA:  30 cc of 1/4% Marcaine  COMPLICATIONS:  None.  INDICATION FOR SURGERY:  Ann Santos is a 34 y.o. white  female who sees LAUENSTEIN,KURT, MD as her primary care doctor.  She has completed our preoperative bariatric program and now comes for a laparoscopic Sleeve gastrectomy.  The indications, potential complications of surgery were explained to the patient.  Potential complications of the surgery include, but are not limited to, bleeding, infection, DVT, open surgery, and long-term nutritional consequences.  OPERATIVE NOTE:  The patient taken to room #1 at [facility] where Ms. Mallick underwent a general endotracheal anesthetic, supervised by Anesthesiologist: Sherrian Divers, MD CRNA: Elisabeth Cara, CRNA.  The patient was given 2 g of cefoxitin at the beginning of the procedure.  A time-out was held and surgical checklist run.  I accessed her abdominal cavity through the left upper quadrant with a 5 mm Optiview. I did an abdominal exploration.   Her omentum and bowel were unremarkable. The right and left lobes of the liver unremarkable. Gallbladder was normal. Her stomach was unremarkable.  She does have a umbilical/ventral hernia.  There is omentum in the hernia.  The hernia is about 5 cm in diamter.  I placed a total of 6 trocars. I placed a 5 mm  left lateral trocar, a 5 mm left paramedian trocar (for the scope), a 12 mm right paramedian torcar, a 5 mm right subcostal trocar that I converted to a 15 mm to extract the stomach and 5 mm subxiphoid trocar for the liver retractor.  I started out taking down the greater curvature attachments of the stomach. I measured approximately 6 cm proximal from the pylorus and mobilized the greater curvature of the stomach with the Harmonic Scalpel. I took this dissection cranially around the greater curvature of her stomach to the angle of His and the left crus.   After I had mobilized the greater curvature of the stomach, I then passed the 36 French ViSiGi bougie which was used to suck the stomach up against the lesser curvature and placed into the antrum. During the staple firing,  I tried to give the ViSiGi a cuff at least about 1 cm. I tried to avoid narrowing the incisura. I used a total of 7 staple firings.  From antrum to the angle of His I used 2 green, 1 gold and 3 blue Eschelon 60 mm Ethicon staplers. I did not use staple line reinforcement.   At each firing of the EndoGIA stapler, I inspected the stomach, anterior wall of the stomach, and underneath to make sure there was no compromise or impingement on to the ViSiGi bougie.   The staple line seemed linear without any corkscrewing of the stomach. Hemostasis was good. I did not use any reinforcement. She did have at least  1 area of bleeding which I used clips to clip on the new greater curvature of the stomach.  Because I thought we had a good staple line, I then had the ViSiGi was converted to insufflate the pouch. A new stomach pouch was placed under water. There was no bubbling or leak noted.   At this point, Dr. Andrey Campanile broke scrub and passed an upper endoscope down through the esophagus into the stomach pouch. The stomach was tubular. There was no narrowing of the stomach pouch or angulation. We were easily able to pass the endoscope into the antrum  and again put air pressure on the staple line. I irrigated the upper abdomen with saline. There was no bubbling or evidence of air leak. The mucosa looked viable. Dr. Andrey Campanile decompressed the stomach with the endoscopy.   I converted to right subcostal trocar to a 15 trocar and extracted the stomach remnant through this intact and sent this to Pathology. I then placed 5 cc of Tisseel along the new greater curvature staple line and covered the entire staple line with the Tisseel.  I aspirated out the saline that I had irrigated because I thought the staple line looked viable and complete. There was no evidence of leak. I did not leave a drain in place.   Then, I closed the trocar sites. I placed 2-0 Vicryl sutures at the 15-mm port site in the right upper quadrant. The other port sites seemed smaller not requiring sutures. I closed the skin at each site with a 4-0 Monocryl, painted each wound with LiquiBand.   The patient was transported to recovery room in good condition. Sponge and needle count were correct at the end of the case.    Ovidio Kin, MD, [facility] Surgery Pager: [phone] Office phone:  [phone]

## 2014-11-17 NOTE — Progress Notes (Signed)
Patient alert and oriented op day.  Provided support and encouragement.  Encouraged pulmonary toilet and  Ambulation.  All questions answered.  Will continue to monitor.

## 2014-11-17 NOTE — Op Note (Signed)
SHAWAN TOSH [id number] 1980-05-23 11/17/2014  Preoperative diagnosis: morbid obesity  Postoperative diagnosis: Same   Procedure: upper endoscopy   Surgeon: Mary Sella. Iven Earnhart M.D., FACS   Anesthesia: Gen.   Indications for procedure: 34year old female undergoing Laparoscopic Gastric Sleeve Resection and an EGD was requested to evaluate the new gastric sleeve.   Description of procedure: After we have completed the sleeve resection, I scrubbed out and obtained the Olympus endoscope. I gently placed endoscope in the patient's oropharynx and gently glided it down the esophagus without any difficulty under direct visualization. Once I was in the gastric sleeve, I insufflated the stomach with air. I was able to cannulate and advanced the scope through the gastric sleeve. I was able to cannulate the pylorus with ease. Dr. Ezzard Standing had placed saline in the upper abdomen. Upon further insufflation of the gastric sleeve there was no evidence of bubbles.   Upon further inspection of the gastric sleeve, the mucosa appeared normal. There is no evidence of any mucosal abnormality. The sleeve was widely patent at the angularis. There was no evidence of bleeding. The gastric sleeve was decompressed. The scope was withdrawn. The patient tolerated this portion of the procedure well. Please see Dr Allene Pyo operative note for details regarding the laparoscopic gastric sleeve resection.   Mary Sella. Andrey Campanile, MD, FACS  General, Bariatric, & Minimally Invasive Surgery  [facility], [place]

## 2014-11-17 NOTE — Anesthesia Preprocedure Evaluation (Addendum)
Anesthesia Evaluation  Patient identified by MRN, date of birth, ID band Patient awake    Reviewed: Allergy & Precautions, NPO status , Patient's Chart, lab work & pertinent test results  Airway Mallampati: II  TM Distance: >3 FB Neck ROM: Full    Dental no notable dental hx.    Pulmonary shortness of breath,  breath sounds clear to auscultation  Pulmonary exam normal       Cardiovascular hypertension, Normal cardiovascular examRhythm:Regular Rate:Normal     Neuro/Psych Anxiety negative neurological ROS     GI/Hepatic Neg liver ROS, hiatal hernia,   Endo/Other  Morbid obesity  Renal/GU negative Renal ROS  negative genitourinary   Musculoskeletal negative musculoskeletal ROS (+)   Abdominal (+) + obese,   Peds negative pediatric ROS (+)  Hematology negative hematology ROS (+)   Anesthesia Other Findings   Reproductive/Obstetrics negative OB ROS                           Anesthesia Physical Anesthesia Plan  ASA: III  Anesthesia Plan: General   Post-op Pain Management:    Induction: Intravenous  Airway Management Planned: Oral ETT  Additional Equipment:   Intra-op Plan:   Post-operative Plan: Extubation in OR  Informed Consent: I have reviewed the patients History and Physical, chart, labs and discussed the procedure including the risks, benefits and alternatives for the proposed anesthesia with the patient or authorized representative who has indicated his/her understanding and acceptance.   Dental advisory given  Plan Discussed with: CRNA  Anesthesia Plan Comments:         Anesthesia Quick Evaluation

## 2014-11-17 NOTE — Anesthesia Postprocedure Evaluation (Signed)
  Anesthesia Post-op Note  Patient: [name]  Procedure(s) Performed: Procedure(s) (LRB): LAPAROSCOPIC GASTRIC SLEEVE RESECTION  (N/A)  Patient Location: PACU  Anesthesia Type: General  Level of Consciousness: awake and alert   Airway and Oxygen Therapy: Patient Spontanous Breathing  Post-op Pain: mild  Post-op Assessment: Post-op Vital signs reviewed, Patient's Cardiovascular Status Stable, Respiratory Function Stable, Patent Airway and No signs of Nausea or vomiting. OSA orders written for floor. Nausea refractory, but now improved after multiple doses of phenergan. No apnea noted in PACU.  Last Vitals:  Filed Vitals:   11/17/14 1236  BP: 162/78  Pulse: 65  Temp: 36.7 C  Resp: 15    Post-op Vital Signs: stable   Complications: No apparent anesthesia complications

## 2014-11-17 NOTE — Anesthesia Procedure Notes (Signed)
Procedure Name: Intubation Date/Time: 11/17/2014 9:12 AM Performed by: Jarvis Newcomer A Pre-anesthesia Checklist: Patient identified, Emergency Drugs available, Suction available, Patient being monitored and Timeout performed Patient Re-evaluated:Patient Re-evaluated prior to inductionOxygen Delivery Method: Circle system utilized Preoxygenation: Pre-oxygenation with 100% oxygen Intubation Type: IV induction Ventilation: Mask ventilation without difficulty Laryngoscope Size: Mac and 4 Grade View: Grade I Tube type: Oral Tube size: 7.5 mm Number of attempts: 1 Airway Equipment and Method: Stylet Placement Confirmation: ETT inserted through vocal cords under direct vision,  positive ETCO2 and breath sounds checked- equal and bilateral Secured at: 21 cm Tube secured with: Tape Dental Injury: Teeth and Oropharynx as per pre-operative assessment

## 2014-11-17 NOTE — Transfer of Care (Signed)
Immediate Anesthesia Transfer of Care Note  Patient: Ann Santos  Procedure(s) Performed: Procedure(s): LAPAROSCOPIC GASTRIC SLEEVE RESECTION  (N/A)  Patient Location: PACU  Anesthesia Type:General  Level of Consciousness: awake, alert , oriented and patient cooperative  Airway & Oxygen Therapy: Patient Spontanous Breathing and Patient connected to face mask oxygen  Post-op Assessment: Report given to RN, Post -op Vital signs reviewed and stable and Patient moving all extremities  Post vital signs: Reviewed and stable  Last Vitals:  Filed Vitals:   11/17/14 0723  BP: 144/91  Pulse: 86  Temp: 36.6 C  Resp: 16    Complications: No apparent anesthesia complications

## 2014-11-18 ENCOUNTER — Inpatient Hospital Stay (HOSPITAL_COMMUNITY): Payer: BLUE CROSS/BLUE SHIELD

## 2014-11-18 ENCOUNTER — Encounter (HOSPITAL_COMMUNITY): Payer: Self-pay | Admitting: Surgery

## 2014-11-18 LAB — CBC WITH DIFFERENTIAL/PLATELET
Basophils Absolute: 0 10*3/uL (ref 0.0–0.1)
Basophils Relative: 0 % (ref 0–1)
Eosinophils Absolute: 0 10*3/uL (ref 0.0–0.7)
Eosinophils Relative: 0 % (ref 0–5)
HCT: 38.2 % (ref 36.0–46.0)
Hemoglobin: 12.7 g/dL (ref 12.0–15.0)
Lymphocytes Relative: 26 % (ref 12–46)
Lymphs Abs: 2.5 10*3/uL (ref 0.7–4.0)
MCH: 29.7 pg (ref 26.0–34.0)
MCHC: 33.2 g/dL (ref 30.0–36.0)
MCV: 89.3 fL (ref 78.0–100.0)
Monocytes Absolute: 0.6 10*3/uL (ref 0.1–1.0)
Monocytes Relative: 6 % (ref 3–12)
Neutro Abs: 6.5 10*3/uL (ref 1.7–7.7)
Neutrophils Relative %: 68 % (ref 43–77)
Platelets: 302 10*3/uL (ref 150–400)
RBC: 4.28 MIL/uL (ref 3.87–5.11)
RDW: 12.5 % (ref 11.5–15.5)
WBC: 9.5 10*3/uL (ref 4.0–10.5)

## 2014-11-18 LAB — HEMOGLOBIN AND HEMATOCRIT, BLOOD
HCT: 38.4 % (ref 36.0–46.0)
Hemoglobin: 12.7 g/dL (ref 12.0–15.0)

## 2014-11-18 MED ORDER — IOHEXOL 300 MG/ML  SOLN
50.0000 mL | Freq: Once | INTRAMUSCULAR | Status: AC | PRN
  Administered 2014-11-18: 50 mL via ORAL

## 2014-11-18 NOTE — Progress Notes (Signed)
General Surgery Note  LOS: 1 day  POD -  1 Day Post-Op  Assessment/Plan: 1.  LAPAROSCOPIC GASTRIC SLEEVE RESECTION - 11/17/2014 - D. Helen Cuff  For morbid obesity - Weight 296, BMI - 54.4  For swallow today  2.  Recurrent umbilical/ventral hernia 3. Preeclampsia with pregnancy. No hypertension since delivery 4. Right worse that left heel spur 5.  DVT prophylaxis - SQ Heparin  Active Problems:   Morbid obesity  Subjective:  Slept okay.  Some nausea.  Walked last PM.  Discussed recurrent umbilical/ventral hernia.  Husband sleeping in room.  Objective:   Filed Vitals:   11/18/14 0523  BP: 124/70  Pulse: 60  Temp: 98.4 F (36.9 C)  Resp: 18     Intake/Output from previous day:  07/25 0701 - 07/26 0700 In: 5267.5 [I.V.:5217.5; IV Piggyback:50] Out: 3800 [Urine:3780; Blood:20]  Intake/Output this shift:      Physical Exam:   General: Obese WF who is alert and oriented.    HEENT: Normal. Pupils equal. .   Lungs: Clear   Abdomen: Soft   Wound: Clean   Lab Results:    Recent Labs  11/17/14 1720 11/18/14 0610  WBC  --  9.5  HGB 13.5 12.7  HCT 40.9 38.2  PLT  --  302    BMET  No results for input(s): NA, K, CL, CO2, GLUCOSE, BUN, CREATININE, CALCIUM in the last 72 hours.  PT/INR  No results for input(s): LABPROT, INR in the last 72 hours.  ABG  No results for input(s): PHART, HCO3 in the last 72 hours.  Invalid input(s): PCO2, PO2   Studies/Results:  No results found.   Anti-infectives:   Anti-infectives    Start     Dose/Rate Route Frequency Ordered Stop   11/17/14 0754  cefOXitin (MEFOXIN) 2 g in dextrose 5 % 50 mL IVPB     2 g 100 mL/hr over 30 Minutes Intravenous On call to O.R. 11/17/14 0754 11/17/14 0915      Ovidio Kin, MD, FACS Pager: [phone] [facility] Office: [phone] 11/18/2014

## 2014-11-19 LAB — CBC WITH DIFFERENTIAL/PLATELET
Basophils Absolute: 0 10*3/uL (ref 0.0–0.1)
Basophils Relative: 0 % (ref 0–1)
Eosinophils Absolute: 0.1 10*3/uL (ref 0.0–0.7)
Eosinophils Relative: 1 % (ref 0–5)
HCT: 37.7 % (ref 36.0–46.0)
Hemoglobin: 12.3 g/dL (ref 12.0–15.0)
Lymphocytes Relative: 38 % (ref 12–46)
Lymphs Abs: 2.7 10*3/uL (ref 0.7–4.0)
MCH: 29.1 pg (ref 26.0–34.0)
MCHC: 32.6 g/dL (ref 30.0–36.0)
MCV: 89.1 fL (ref 78.0–100.0)
Monocytes Absolute: 0.5 10*3/uL (ref 0.1–1.0)
Monocytes Relative: 6 % (ref 3–12)
Neutro Abs: 3.9 10*3/uL (ref 1.7–7.7)
Neutrophils Relative %: 55 % (ref 43–77)
Platelets: 258 10*3/uL (ref 150–400)
RBC: 4.23 MIL/uL (ref 3.87–5.11)
RDW: 12.4 % (ref 11.5–15.5)
WBC: 7.1 10*3/uL (ref 4.0–10.5)

## 2014-11-19 NOTE — Discharge Instructions (Addendum)
[facility] - DISCHARGE INSTRUCTIONS TO PATIENT  Activity:  Driving - May drive in 2 to 4 days, if doing well and off pain meds   Lifting - Take it easy for 7 to 10 days (no lifting more than 15 pounds), then no limit  Wound Care:   May start showers tonight  Diet:  Post op protein drinks  Follow up appointment:  Call Dr. Allene Pyo office [facility]) at [phone] for an appointment in 2 weeks  Medications and dosages:  Resume your home medications.  You have a prescription for:  Oxycodone (she has prescription)  Call Dr. Ezzard Standing or his office  [phone]) if you have:  Temperature greater than 100.4,  Persistent nausea and vomiting,  Severe uncontrolled pain,  Redness, tenderness, or signs of infection (pain, swelling, redness, odor or green/yellow discharge around the site),  Difficulty breathing, headache or visual disturbances,  Any other questions or concerns you may have after discharge.  In an emergency, call 911 or go to an Emergency Department at a nearby hospital.       GASTRIC BYPASS/SLEEVE  Home Care Instructions   These instructions are to help you care for yourself when you go home.  Call: If you have any problems. Call [phone] and ask for the surgeon on call If you need immediate assistance come to the ER at [facility]. Tell the ER staff you are a new post-op gastric bypass or gastric sleeve patient  Signs and symptoms to report: Severe  vomiting or nausea If you cannot handle clear liquids for longer than 1 day, call your surgeon Abdominal pain which does not get better after taking your pain medication Fever greater than 100.4  F and chills Heart rate over 100 beats a minute Trouble breathing Chest pain Redness,  swelling, drainage, or foul odor at incision (surgical) sites If your incisions open or pull apart Swelling or pain in calf (lower leg) Diarrhea (Loose bowel movements that happen often), frequent watery,  uncontrolled bowel movements Constipation, (no bowel movements for 3 days) if this happens: Take Milk of Magnesia, 2 tablespoons by mouth, 3 times a day for 2 days if needed Stop taking Milk of Magnesia once you have had a bowel movement Call your doctor if constipation continues Or Take Miralax  (instead of Milk of Magnesia) following the label instructions Stop taking Miralax once you have had a bowel movement Call your doctor if constipation continues Anything you think is abnormal for you   Normal side effects after surgery: Unable to sleep at night or unable to concentrate Irritability Being tearful (crying) or depressed  These are common complaints, possibly related to your anesthesia, stress of surgery, and change in lifestyle, that usually go away a few weeks after surgery. If these feelings continue, call your medical doctor.  Wound Care: You may have surgical glue, steri-strips, or staples over your incisions after surgery Surgical glue: Looks like clear film over your incisions and will wear off a little at a time Steri-strips: Adhesive strips of tape over your incisions. You may notice a yellowish color on skin under the steri-strips. This is used to make the steri-strips stick better. Do not pull the steri-strips off - let them fall off Staples: Staples may be removed before you leave the hospital If you go home with staples, call [facility] for an appointment with your surgeons nurse to have staples removed 10 days after surgery, [phone] Showering: You may shower two (2) days after  your surgery unless your surgeon tells you differently Wash gently around incisions with warm soapy water, rinse well, and gently pat dry If you have a drain (tube from your incision), you may need someone to hold this while you shower No tub baths until staples are removed and incisions are healed   Medications: Medications should be liquid or crushed if larger than the size  of a dime Extended release pills (medication that releases a little bit at a time through the  day) should not be crushed Depending on the size and number of medications you take, you may need to space (take a few throughout the day)/change the time you take your medications so that you do not over-fill your pouch (smaller stomach) Make sure you follow-up with you primary care physician to make medication changes needed during rapid weight loss and life -style changes If you have diabetes, follow up with your doctor that orders your diabetes medication(s) within one week after surgery and check your blood sugar regularly  Do not drive while taking narcotics (pain medications)  Do not take acetaminophen (Tylenol) and Roxicet or Lortab Elixir at the same time since these pain medications contain acetaminophen   Diet:  First 2 Weeks You will see the nutritionist about two (2) weeks after your surgery. The nutritionist will increase the types of foods you can eat if you are handling liquids well: If you have severe vomiting or nausea and cannot handle clear liquids lasting longer than 1 day call your surgeon Protein Shake Drink at least 2 ounces of shake 5-6 times per day Each serving of protein shakes (usually 8-12 ounces) should have a minimum of: 15 grams of protein And no more than 5 grams of carbohydrate Goal for protein each day: Men = 80 grams per day Women = 60 grams per day    Protein powder may be added to fluids such as non-fat milk or Lactaid milk or Soy milk (limit to 35 grams added protein powder per serving)  Hydration Slowly increase the amount of water and other clear liquids as tolerated (See Acceptable Fluids) Slowly increase the amount of protein shake as tolerated Sip fluids slowly and throughout the day May use sugar substitutes in small amounts (no more than 6-8 packets per day; i.e. Splenda)  Fluid Goal The first goal is to drink at least 8 ounces of protein  shake/drink per day (or as directed by the nutritionist); some examples of protein shakes are [employer], [employer], EAS Edge HP, and Unjury. - See handout from pre-op Bariatric Education Class: Slowly increase the amount of protein shake you drink as tolerated You may find it easier to slowly sip shakes throughout the day It is important to get your proteins in first Your fluid goal is to drink 64-100 ounces of fluid daily It may take a few weeks to build up to this  32 oz. (or more) should be clear liquids And 32 oz. (or more) should be full liquids (see below for examples) Liquids should not contain sugar, caffeine, or carbonation  Clear Liquids: Water of Sugar-free flavored water (i.e. Fruit HO, Propel) Decaffeinated coffee or tea (sugar-free) Crystal lite, Wylers Lite, Minute Maid Lite Sugar-free Jell-O Bouillon or broth Sugar-free Popsicle:    - Less than 20 calories each; Limit 1 per day  Full Liquids:                   Protein Shakes/Drinks + 2 choices per day of other full  liquids Full liquids must be: No More Than 12 grams of Carbs per serving No More Than 3 grams of Fat per serving Strained low-fat cream soup Non-Fat milk Fat-free Lactaid Milk Sugar-free yogurt (Dannon Lite & Fit, Greek yogurt)    Vitamins and Minerals Start 1 day after surgery unless otherwise directed by your surgeon 2 Chewable Multivitamin / Multimineral Supplement with iron (i.e. Centrum for Adults) Vitamin B-12, 350-500 micrograms sub-lingual (place tablet under the tongue) each day Chewable Calcium Citrate with Vitamin D-3 (Example: 3 Chewable Calcium  Plus 600 with Vitamin D-3) Take 500 mg three (3) times a day for a total of 1500 mg each day Do not take all 3 doses of calcium at one time as it may cause constipation, and you can only absorb 500 mg at a time Do not mix multivitamins containing iron with calcium supplements;  take 2 hours apart Do not substitute Tums (calcium  carbonate) for your calcium Menstruating women and those at risk for anemia ( a blood disease that causes weakness) may need extra iron Talk to your doctor to see if you need more iron If you need extra iron: Total daily Iron recommendation (including Vitamins) is 50 to 100 mg Iron/day Do not stop taking or change any vitamins or minerals until you talk to your nutritionist or surgeon Your nutritionist and/or surgeon must approve all vitamin and mineral supplements   Activity and Exercise: It is important to continue walking at home. Limit your physical activity as instructed by your doctor. During this time, use these guidelines: Do not lift anything greater than ten  (10) pounds for at least two (2) weeks Do not go back to work or drive until Designer, industrial/product says you can You may have sex when you feel comfortable It is VERY important for female patients to use a reliable birth control method; fertility often increase after surgery Do not get pregnant for at least 18 months Start exercising as soon as your doctor tells you that you can Make sure your doctor approves any physical activity Start with a simple walking program Walk 5-15 minutes each day, 7 days per week Slowly increase until you are walking 30-45 minutes per day Consider joining our BELT program. [phone] or email [email or web address]@[employer] .[email or web address]   Special Instructions Things to remember: Free counseling is available for you and your family through collaboration between [facility] and [place]. Please call [phone] and leave a message Use your CPAP when sleeping if this applies to you Consider buying a medical alert bracelet that says you had lap-band surgery     You will likely have your first fill (fluid added to your band) 6 - 8 weeks after surgery [facility] has a free Bariatric Surgery Support Group that meets monthly, the 3rd Thursday, 6pm. Calvert Cantor. You can see classes online at  [email or web address] It is very important to keep all follow up appointments with your surgeon, nutritionist, primary care physician, and behavioral health practitioner After the first year, please follow up with your bariatric surgeon and nutritionist at least once a year in order to maintain best weight loss results                    [facility]:  [phone]               [facility]: [phone]               Bariatric  Nurse Coordinator: [phone]  Gastric Bypass/Sleeve Home Care Instructions  Rev. 05/2012                                                         Reviewed and Endorsed                                                    by [facility] Patient Education Committee, Jan, 2014

## 2014-11-19 NOTE — Discharge Summary (Signed)
Physician Discharge Summary  Patient ID:  Ann Santos  MRN: [id number]  DOB/AGE: [birth year] 34 y.o.  Admit date: 11/17/2014 Discharge date: 11/19/2014  Discharge Diagnoses:  1. Morbid obesity - Weight 296, BMI - 54.4  2. Recurrent umbilical/ventral hernia 3. Preeclampsia with pregnancy. No hypertension since delivery 4. Right worse that left heel spur   Active Problems:   Morbid obesity  Operation: Procedure(s): LAPAROSCOPIC GASTRIC SLEEVE RESECTION  -  11/17/2014 - D. Ezzard Standing  Discharged Condition: good  Hospital Course: Ann Santos is an 34 y.o. female whose primary care physician is Elvina Sidle, MD and who was admitted 11/17/2014 with a chief complaint of No chief complaint on file.   She was brought to the operating room on 11/17/2014 and underwent  LAPAROSCOPIC GASTRIC SLEEVE RESECTION.   At the time of surgery, I found a recurrent umbilical/ventral hernia.  This will be addressed when she has lost weight from her surgery, unless it becomes symptomatic.  She had a swallow on 11/19/2014 which showed normal appearing post op stomach with no leak.  She was started on water, which she has done well with. She will get protein drinks today and then is ready to go home.  The discharge instructions were reviewed with the patient.  Consults: None  Significant Diagnostic Studies: Results for orders placed or performed during the hospital encounter of 11/17/14  Hemoglobin and hematocrit, blood  Result Value Ref Range   Hemoglobin 13.5 12.0 - 15.0 g/dL   HCT 40.9 81.1 - 91.4 %  CBC WITH DIFFERENTIAL  Result Value Ref Range   WBC 9.5 4.0 - 10.5 K/uL   RBC 4.28 3.87 - 5.11 MIL/uL   Hemoglobin 12.7 12.0 - 15.0 g/dL   HCT 78.2 95.6 - 21.3 %   MCV 89.3 78.0 - 100.0 fL   MCH 29.7 26.0 - 34.0 pg   MCHC 33.2 30.0 - 36.0 g/dL   RDW 08.6 57.8 - 46.9 %   Platelets 302 150 - 400 K/uL   Neutrophils Relative % 68 43 - 77 %   Neutro Abs 6.5 1.7 - 7.7 K/uL   Lymphocytes Relative  26 12 - 46 %   Lymphs Abs 2.5 0.7 - 4.0 K/uL   Monocytes Relative 6 3 - 12 %   Monocytes Absolute 0.6 0.1 - 1.0 K/uL   Eosinophils Relative 0 0 - 5 %   Eosinophils Absolute 0.0 0.0 - 0.7 K/uL   Basophils Relative 0 0 - 1 %   Basophils Absolute 0.0 0.0 - 0.1 K/uL  Hemoglobin and hematocrit, blood  Result Value Ref Range   Hemoglobin 12.7 12.0 - 15.0 g/dL   HCT 62.9 52.8 - 41.3 %  CBC with Differential  Result Value Ref Range   WBC 7.1 4.0 - 10.5 K/uL   RBC 4.23 3.87 - 5.11 MIL/uL   Hemoglobin 12.3 12.0 - 15.0 g/dL   HCT 24.4 01.0 - 27.2 %   MCV 89.1 78.0 - 100.0 fL   MCH 29.1 26.0 - 34.0 pg   MCHC 32.6 30.0 - 36.0 g/dL   RDW 53.6 64.4 - 03.4 %   Platelets 258 150 - 400 K/uL   Neutrophils Relative % 55 43 - 77 %   Neutro Abs 3.9 1.7 - 7.7 K/uL   Lymphocytes Relative 38 12 - 46 %   Lymphs Abs 2.7 0.7 - 4.0 K/uL   Monocytes Relative 6 3 - 12 %   Monocytes Absolute 0.5 0.1 - 1.0 K/uL   Eosinophils Relative  1 0 - 5 %   Eosinophils Absolute 0.1 0.0 - 0.7 K/uL   Basophils Relative 0 0 - 1 %   Basophils Absolute 0.0 0.0 - 0.1 K/uL    Dg Ugi W/water Sol Cm  11/18/2014   CLINICAL DATA:  Morbid obesity. Postop day 1 from sleeve gastrectomy.  EXAM: WATER SOLUBLE UPPER GI SERIES  TECHNIQUE: Single-column upper GI series was performed using water soluble contrast.  CONTRAST:  50mL OMNIPAQUE IOHEXOL 300 MG/ML  SOLN  COMPARISON:  07/21/2014  FLUOROSCOPY TIME:  Fluoroscopy Time (in minutes and seconds): 1 minutes 2 seconds  Number of Acquired Images:  8  FINDINGS: Upper GI series shows no evidence of esophageal mass or obstruction. Expected postoperative appearance of stomach is seen status post sleeve gastrectomy. No evidence of contrast leak or extravasation. No evidence of obstruction. Prompt gastric emptying is noted. Duodenum is normal in appearance.  IMPRESSION: Expected postoperative appearance status post sleeve gastrectomy. No evidence of contrast leak or obstruction.   Electronically  Signed   By: Myles Rosenthal M.D.   On: 11/18/2014 11:47    Discharge Exam:  Filed Vitals:   11/19/14 0600  BP: 117/83  Pulse: 67  Temp: 98.6 F (37 C)  Resp: 18    General: Obese WF who is alert and generally healthy appearing.  Lungs: Clear to auscultation and symmetric breath sounds. Heart:  RRR. No murmur or rub. Abdomen: Soft. No hernia. Normal bowel sounds.   Incisions look good.  Discharge Medications:     Medication List    TAKE these medications        acetaminophen 500 MG tablet  Commonly known as:  TYLENOL  Take 500 mg by mouth every 6 (six) hours as needed for moderate pain.     cetirizine 10 MG tablet  Commonly known as:  ZYRTEC  Take 10 mg by mouth daily as needed for allergies.     CRYSELLE-28 0.3-30 MG-MCG tablet  Generic drug:  norgestrel-ethinyl estradiol  Take 1 tablet by mouth daily.     fluticasone 50 MCG/ACT nasal spray  Commonly known as:  FLONASE  Place 2 sprays into both nostrils daily as needed for allergies or rhinitis.     phentermine 37.5 MG capsule  Take 37.5 mg by mouth every morning.     PROBIOTIC COLON SUPPORT PO  Take 1 tablet by mouth daily.     pseudoephedrine 120 MG 12 hr tablet  Commonly known as:  SUDAFED  Take 120 mg by mouth every 12 (twelve) hours as needed for congestion.     sertraline 100 MG tablet  Commonly known as:  ZOLOFT  Take 1 tablet (100 mg total) by mouth at bedtime.     traMADol 50 MG tablet  Commonly known as:  ULTRAM  Take 1 tablet (50 mg total) by mouth every 8 (eight) hours as needed.     traZODone 100 MG tablet  Commonly known as:  DESYREL  TAKE 1 TABLET (100 MG TOTAL) BY MOUTH AT BEDTIME.        Disposition: 01-Home or Self Care      Discharge Instructions    Ambulate hourly while awake    Complete by:  As directed      Call MD for:  difficulty breathing, headache or visual disturbances    Complete by:  As directed      Call MD for:  persistant dizziness or light-headedness    Complete  by:  As directed  Call MD for:  persistant nausea and vomiting    Complete by:  As directed      Call MD for:  redness, tenderness, or signs of infection (pain, swelling, redness, odor or green/yellow discharge around incision site)    Complete by:  As directed      Call MD for:  severe uncontrolled pain    Complete by:  As directed      Call MD for:  temperature >101 F    Complete by:  As directed      Diet bariatric full liquid    Complete by:  As directed      Incentive spirometry    Complete by:  As directed   Perform hourly while awake           Follow-up Information    Follow up with [facility], MD. Go on 11/27/2014.   Specialty:  General Surgery   Why:  For Post-Op Check at 9:30 AM   Contact information:   [address] [place] [address] [phone]      Activity:  Driving - May drive in 2 to 4 days, if doing well and off pain meds   Lifting - Take it easy for 7 to 10 days (no lifting more than 15 pounds), then no limit  Wound Care:   May start showers tonight  Diet:  Post op protein drinks  Follow up appointment:  Call Dr. Allene Pyo office [facility]) at [phone] for an appointment in 2 weeks  Medications and dosages:  Resume your home medications.  You have a prescription for:  Oxycodone (she has prescription)   Signed: Ovidio Kin, M.D., [facility] Office:  [phone]  11/19/2014, 7:43 AM

## 2014-11-19 NOTE — Progress Notes (Signed)
Patient alert and oriented, pain is controlled. Patient is tolerating fluids,  advanced to protein shake today, patient tolerated well.  Reviewed Gastric sleeve discharge instructions with patient and patient is able to articulate understanding.  Provided information on BELT program, Support Group and [facility] outpatient pharmacy. All questions answered, will continue to monitor.  

## 2014-11-19 NOTE — Progress Notes (Signed)
Discussed discharge instructions with patient and husband. Had no questions nor concerns. Discharged to home.

## 2014-11-21 NOTE — Telephone Encounter (Signed)
Called in RF 

## 2014-11-26 ENCOUNTER — Telehealth (HOSPITAL_COMMUNITY): Payer: Self-pay

## 2014-11-26 NOTE — Telephone Encounter (Signed)
Made discharge phone call to patient per DROP protocol. Asking the following questions.    1. Do you have someone to care for you now that you are home?  yes 2. Are you having pain now that is not relieved by your pain medication?  no 3. Are you able to drink the recommended daily amount of fluids (48 ounces minimum/day) and protein (60-80 grams/day) as prescribed by the dietitian or nutritional counselor?  yes 4. Are you taking the vitamins and minerals as prescribed?  yes 5. Do you have the "on call" number to contact your surgeon if you have a problem or question?  yes 6. Are your incisions free of redness, swelling or drainage? (If steri strips, address that these can fall off, shower as tolerated) yes 7. Have your bowels moved since your surgery?  If not, are you passing gas?  yes 8. Are you up and walking 3-4 times per day?  yes    1. Do you have an appointment made to see your surgeon in the next month?  yes 2. Were you provided your discharge medications before your surgery or before you were discharged from the hospital and are you taking them without problem?  yes 3. Were you provided phone numbers to the clinic/surgeon's office?  yes 4. Did you watch the patient education video module in the (clinic, surgeon's office, etc.) before your surgery? yes 5. Do you have a discharge checklist that was provided to you in the hospital to reference with instructions on how to take care of yourself after surgery?  yes 6. Did you see a dietitian or nutritional counselor while you were in the hospital?  yes 7. Do you have an appointment to see a dietitian or nutritional counselor in the next month?  yes   

## 2014-12-02 ENCOUNTER — Encounter: Payer: BLUE CROSS/BLUE SHIELD | Attending: Surgery

## 2014-12-02 DIAGNOSIS — E669 Obesity, unspecified: Secondary | ICD-10-CM | POA: Diagnosis not present

## 2014-12-02 DIAGNOSIS — Z713 Dietary counseling and surveillance: Secondary | ICD-10-CM | POA: Insufficient documentation

## 2014-12-02 DIAGNOSIS — Z6841 Body Mass Index (BMI) 40.0 and over, adult: Secondary | ICD-10-CM | POA: Diagnosis not present

## 2014-12-02 NOTE — Progress Notes (Signed)
Bariatric Class:  Appt start time: 1530 end time:  1630.  2 Week Post-Operative Nutrition Class  Patient was seen on 12/02/14 for Post-Operative Nutrition education at the [facility].   Surgery date: 11/17/14 Surgery type: Sleeve Gastrectomy Start weight at [facility]: 296 lbs on 09/10/14 Weight today: 274.0 lbs  Weight change: 20.5 lbs  TANITA  BODY COMP RESULTS  11/03/14 12/02/14   BMI (kg/m^2) 53.9 50.1   Fat Mass (lbs) 157 138.0   Fat Free Mass (lbs) 137.5 136.0   Total Body Water (lbs) 100.5 99.5    The following the learning objectives were met by the patient during this course:  Identifies Phase 3A (Soft, High Proteins) Dietary Goals and will begin from 2 weeks post-operatively to 2 months post-operatively  Identifies appropriate sources of fluids and proteins   States protein recommendations and appropriate sources post-operatively  Identifies the need for appropriate texture modifications, mastication, and bite sizes when consuming solids  Identifies appropriate multivitamin and calcium sources post-operatively  Describes the need for physical activity post-operatively and will follow MD recommendations  States when to call healthcare provider regarding medication questions or post-operative complications  Handouts given during class include:  Phase 3A: Soft, High Protein Diet Handout  Follow-Up Plan: Patient will follow-up at [facility] in 6 weeks for 2 month post-op nutrition visit for diet advancement per MD.

## 2014-12-19 ENCOUNTER — Other Ambulatory Visit: Payer: Self-pay

## 2014-12-19 MED ORDER — TRAZODONE HCL 100 MG PO TABS: ORAL_TABLET | ORAL | Status: DC

## 2014-12-19 NOTE — Telephone Encounter (Signed)
Sent in corrected sig on Rx for trazodone according to last OV notes discussing dosage.

## 2014-12-22 ENCOUNTER — Other Ambulatory Visit: Payer: Self-pay

## 2014-12-22 MED ORDER — TRAZODONE HCL 100 MG PO TABS: ORAL_TABLET | ORAL | Status: DC

## 2015-01-12 ENCOUNTER — Encounter: Payer: BLUE CROSS/BLUE SHIELD | Attending: Surgery | Admitting: Dietician

## 2015-01-12 ENCOUNTER — Encounter: Payer: Self-pay | Admitting: Dietician

## 2015-01-12 DIAGNOSIS — E669 Obesity, unspecified: Secondary | ICD-10-CM | POA: Insufficient documentation

## 2015-01-12 DIAGNOSIS — Z713 Dietary counseling and surveillance: Secondary | ICD-10-CM | POA: Insufficient documentation

## 2015-01-12 DIAGNOSIS — Z6841 Body Mass Index (BMI) 40.0 and over, adult: Secondary | ICD-10-CM | POA: Insufficient documentation

## 2015-01-12 NOTE — Progress Notes (Signed)
  Follow-up visit:  8 Weeks Post-Operative Sleeve Gastrectomy Surgery  Medical Nutrition Therapy:  Appt start time: 330 end time:  400  Primary concerns today: Post-operative Bariatric Surgery Nutrition Management.  Lucill returns having lost another 15 lbs since post op class. She has not eaten to the point where she has felt uncomfortably full. Has started having more vegetables and tolerating. Still feels like she sometimes eats too fast.    Surgery date: 11/17/14 Surgery type: Sleeve Gastrectomy Start weight at [facility]: 296 lbs on 09/10/14 Weight today: 258.5 lbs Weight change: 15.5 lbs Total weight lost: 37.5 lbs  TANITA  BODY COMP RESULTS  11/03/14 12/02/14 01/12/15   BMI (kg/m^2) 53.9 50.1 47.3   Fat Mass (lbs) 157 138.0 128.5   Fat Free Mass (lbs) 137.5 136.0 130   Total Body Water (lbs) 100.5 99.5 95    Preferred Learning Style:   No preference indicated  Learning Readiness:   Ready  24-hr recall: B (AM): Premier protein shake and cheese OR cheese and egg on a tortilla (8-30g) Snk (AM):   L (PM): 6-8 pieces meat and 2 slices cheese rollup (21g) Snk (PM): cheese stick (7g)  D (PM): 2-3 oz shrimp or fish or chicken with vegetable (14-21g) Snk (PM): part of a protein shake sometimes  Fluid intake: diet tea, crystal light, water, diet Snapple (64+ oz per day per patient)  Estimated total protein intake: ~60g per day  Medications: see list Supplementation: taking  Using straws: yes, no gas pain Drinking while eating: no Hair loss: unsure Carbonated beverages: tried Diet Coke N/V/D/C: nausea after having a glass of milk Dumping syndrome: no  Recent physical activity:  Inconsistent, walking and gym on the weekends  Progress Towards Goal(s):  In progress.  Handouts given during visit include:  Phase 3B lean protein + non starchy vegetables   Nutritional Diagnosis:  [place]-3.3 Overweight/obesity related to past poor dietary habits and physical inactivity as evidenced by  patient w/ recent sleeve gastrectomy surgery following dietary guidelines for continued weight loss.     Intervention:  Nutrition counseling provided.  Teaching Method Utilized:  Visual Auditory Hands on  Barriers to learning/adherence to lifestyle change: busy schedule  Demonstrated degree of understanding via:  Teach Back   Monitoring/Evaluation:  Dietary intake, exercise, and body weight. Follow up in 2 months for 4 month post-op visit.

## 2015-01-12 NOTE — Patient Instructions (Addendum)
Goals:  Follow Phase 3B: High Protein + Non-Starchy Vegetables  Eat 3-6 small meals/snacks, every 3-5 hrs  Increase lean protein foods to meet 60g goal  Increase fluid intake to 64oz +  Avoid drinking 15 minutes before, during and 30 minutes after eating  Aim for >30 min of physical activity daily  Try having Vilma Meckel [place] sausage or yogurt for breakfast  Continue to keep protein shakes on hand just in case  Weigh only 1x a week (focus on non scale victories)  Surgery date: 11/17/14 Surgery type: Sleeve Gastrectomy Start weight at [facility]: 296 lbs on 09/10/14 Weight today: 258.5 lbs Weight change: 15.5 lbs Total weight lost: 37.5 lbs  TANITA  BODY COMP RESULTS  11/03/14 12/02/14 01/12/15   BMI (kg/m^2) 53.9 50.1 47.3   Fat Mass (lbs) 157 138.0 128.5   Fat Free Mass (lbs) 137.5 136.0 130   Total Body Water (lbs) 100.5 99.5 95

## 2015-03-24 ENCOUNTER — Ambulatory Visit: Payer: BLUE CROSS/BLUE SHIELD | Admitting: Dietician

## 2015-04-22 ENCOUNTER — Other Ambulatory Visit: Payer: Self-pay | Admitting: Physician Assistant

## 2015-06-05 ENCOUNTER — Other Ambulatory Visit: Payer: Self-pay | Admitting: Family Medicine

## 2015-06-05 ENCOUNTER — Ambulatory Visit (INDEPENDENT_AMBULATORY_CARE_PROVIDER_SITE_OTHER): Payer: BLUE CROSS/BLUE SHIELD | Admitting: Family Medicine

## 2015-06-05 VITALS — BP 138/94 | HR 70 | Temp 98.4°F | Resp 16 | Ht 62.0 in | Wt 247.6 lb

## 2015-06-05 DIAGNOSIS — F988 Other specified behavioral and emotional disorders with onset usually occurring in childhood and adolescence: Secondary | ICD-10-CM

## 2015-06-05 DIAGNOSIS — R05 Cough: Secondary | ICD-10-CM | POA: Diagnosis not present

## 2015-06-05 DIAGNOSIS — F909 Attention-deficit hyperactivity disorder, unspecified type: Secondary | ICD-10-CM | POA: Diagnosis not present

## 2015-06-05 DIAGNOSIS — R059 Cough, unspecified: Secondary | ICD-10-CM

## 2015-06-05 MED ORDER — ATOMOXETINE HCL 18 MG PO CAPS: 18.0000 mg | ORAL_CAPSULE | Freq: Every day | ORAL | Status: DC

## 2015-06-05 MED ORDER — AZITHROMYCIN 250 MG PO TABS: ORAL_TABLET | ORAL | Status: DC

## 2015-06-05 MED ORDER — ALBUTEROL SULFATE 108 (90 BASE) MCG/ACT IN AEPB: 2.0000 | INHALATION_SPRAY | Freq: Four times a day (QID) | RESPIRATORY_TRACT | Status: DC | PRN

## 2015-06-05 NOTE — Progress Notes (Signed)
 @  By signing my name below, I, Raven Small, attest that this documentation has been prepared under the direction and in the presence of Elvina Sidle, MD.  Electronically Signed: Andrew Au, ED Scribe. 06/05/2015. 10:15 AM.  Patient ID: Ann Santos MRN: [id number], DOB: 02/01/1981, 35 y.o. Date of Encounter: 06/05/2015, 10:09 AM  Primary Physician: Elvina Sidle, MD  Chief Complaint:  Chief Complaint  Patient presents with   Cough    some SOB, fatigue x 1 1/2 week   Headache    HPI: 35 y.o. year old female with history below presents with cough for 1.5 weeks. Cough is worse at night. Pt reports associated body aches, fatigue, headaches, chest congestion consisting of green mucous. She has been taking mucinex and delsym. 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 She denies hx of asthma.  Pt reports trouble focusing at work. She feels like she has become a mess and that coworkers are beginning to notice which she states is embarrassing. She reports trouble focusing issues are only at work and that she has multiple task that she has to do and that she is not doing her best.   Past Medical History  Diagnosis Date   Diverticulitis     Per pt. States she never had CT scan to confirm this   Diverticulosis     PT HOSPITALIZED AT [facility] October 03, 2011   Umbilical hernia     planning surgical repair   Constipation    Abdominal distention    Abdominal pain    Blood in stool    Rectal bleeding    Hypertension     During pregnancy only   Gestational hypertension 07/05/2013   Maternal obesity, antepartum 07/05/2013   Gestational hypertension 07/05/2013   Maternal obesity, antepartum 07/05/2013   Shortness of breath dyspnea     with exercise   Anxiety    Numbness     first two fingers right hand    History of hiatal hernia    History of chicken pox     childhood     Home Meds: Prior to Admission medications   Medication Sig Start Date End  Date Taking? Authorizing Provider  acetaminophen (TYLENOL) 500 MG tablet Take 500 mg by mouth every 6 (six) hours as needed for moderate pain.   Yes Historical Provider, MD  cetirizine (ZYRTEC) 10 MG tablet Take 10 mg by mouth daily as needed for allergies.   Yes Historical Provider, MD  CRYSELLE-28 0.3-30 MG-MCG tablet Take 1 tablet by mouth daily. 10/30/14  Yes Historical Provider, MD  fluticasone (FLONASE) 50 MCG/ACT nasal spray Place 2 sprays into both nostrils daily as needed for allergies or rhinitis. 04/06/14  Yes Ofilia Neas, PA-C  Probiotic Product (PROBIOTIC COLON SUPPORT PO) Take 1 tablet by mouth daily.   Yes Historical Provider, MD  pseudoephedrine (SUDAFED) 120 MG 12 hr tablet Take 120 mg by mouth every 12 (twelve) hours as needed for congestion.   Yes Historical Provider, MD  sertraline (ZOLOFT) 100 MG tablet Take 1 tablet (100 mg total) by mouth at bedtime. 10/02/14  Yes Peyton Najjar, MD  traMADol (ULTRAM) 50 MG tablet TAKE 1 TABLET BY MOUTH EVERY 8 HOURS AS NEEDED FOR PAIN 11/20/14  Yes Ofilia Neas, PA-C  traZODone (DESYREL) 100 MG tablet TAKE 1 TABLET (100 MG TOTAL) BY MOUTH AT BEDTIME. PATIENT NEEDS OFFICE VISIT FOR ADDITIONAL REFILLS 12/22/14  Yes Peyton Najjar, MD    Allergies: No Known Allergies  Social History   Social History   Marital Status: Married    Spouse Name: N/A   Number of Children: N/A   Years of Education: N/A   Occupational History   Not on file.   Social History Main Topics   Smoking status: Never Smoker    Smokeless tobacco: Never Used   Alcohol Use: No   Drug Use: No   Sexual Activity: Yes   Other Topics Concern   Not on file   Social History Narrative    Review of Systems: Constitutional: negative for chills, fever, night sweats, weight changes, or fatigue  HEENT: negative for vision changes, hearing loss, congestion, rhinorrhea, ST, epistaxis, or sinus pressure Cardiovascular: negative for chest pain or  palpitations Respiratory: negative for hemoptysis, wheezing, shortness of breath. Abdominal: negative for abdominal pain, nausea, vomiting, diarrhea, or constipation Dermatological: negative for rash Neurologic: negative for dizziness, or syncope All other systems reviewed and are otherwise negative with the exception to those above and in the HPI.   Physical Exam: Blood pressure 138/94, pulse 70, temperature 98.4 F (36.9 C), temperature source Oral, resp. rate 16, height  (1.575 m), weight 247 lb 9.6 oz (112.311 kg), SpO2 98 %, not currently breastfeeding., Body mass index is 45.28 kg/(m^2). General: Well developed, well nourished, in no acute distress. Head: Normocephalic, atraumatic, eyes without discharge, sclera non-icteric, nares are without discharge. Bilateral auditory canals clear, TM's are without perforation, pearly grey and translucent with reflective cone of light bilaterally. Oral cavity moist, posterior pharynx without exudate, erythema, peritonsillar abscess, or post nasal drip.  Neck: Supple. No thyromegaly. Full ROM. No lymphadenopathy. Lungs: Clear bilaterally to auscultation without rales, or rhonchi. Breathing is unlabored. Faint expiratory wheezes. Heart: RRR with S1 S2. No murmurs, rubs, or gallops appreciated. Msk:  Strength and tone normal for age. Extremities/Skin: Warm and dry.  No edema. No rashes or suspicious lesions. Neuro: Alert and oriented X 3. Moves all extremities spontaneously. Gait is normal. CNII-XII grossly in tact. Psych:  Responds to questions appropriately with a normal affect.    ASSESSMENT AND PLAN:  35 y.o. year old female with     ICD-9-CM ICD-10-CM   1. Cough 786.2 R05 azithromycin (ZITHROMAX) 250 MG tablet     Albuterol Sulfate (PROAIR RESPICLICK) 108 (90 Base) MCG/ACT AEPB  2. ADD (attention deficit disorder) 314.00 F90.9 atomoxetine (STRATTERA) 18 MG capsule    Signed, Elvina Sidle, MD 06/05/2015 10:09 AM

## 2015-06-05 NOTE — Patient Instructions (Signed)
Please return in one month to review how work is going.  Also, please let me know if your cough is not resolving by Monday

## 2015-06-08 NOTE — Telephone Encounter (Signed)
Dr L, you just saw pt last week, but don't see trazadone discussed. OK to RF?

## 2015-06-10 ENCOUNTER — Other Ambulatory Visit: Payer: Self-pay | Admitting: Family Medicine

## 2015-06-10 ENCOUNTER — Telehealth: Payer: Self-pay

## 2015-06-10 DIAGNOSIS — F988 Other specified behavioral and emotional disorders with onset usually occurring in childhood and adolescence: Secondary | ICD-10-CM

## 2015-06-10 MED ORDER — BUPROPION HCL ER (XL) 150 MG PO TB24: 150.0000 mg | ORAL_TABLET | Freq: Every day | ORAL | Status: DC

## 2015-06-10 NOTE — Telephone Encounter (Signed)
Pt states she has had Wellbutrin in the past and did not like it. She says she is already taking Zoloft and did not want to add Wellbutrin with it.  Any other options for patient?   Message from Elvina Sidle, MD sent at 06/10/2015 4:13 PM EST -----    I discontinued the Strattera and ordered Wellbutrin which is another medication for ADD

## 2015-06-10 NOTE — Telephone Encounter (Signed)
PA approved for Strattera on covermymeds. Notified pharm.

## 2015-06-10 NOTE — Telephone Encounter (Signed)
Spoke to pt and advised that I was able to get PA approved, and asked if she had talked w/pharm since. Pt reported that the pharm did get the approval, and even with insurance coverage the Rx is still over $300. Dr Milus Glazier, do you want to try anything else? Stimulant, or wellbutrin?

## 2015-06-10 NOTE — Telephone Encounter (Signed)
I think that patient might benefit from Adderall or a related substance. Since this is a family practice, she would need to see a psychiatry specialist to get this ordered. Therefore I will refer her to [facility] where they have a specialist in ADD and ADHD

## 2015-06-10 NOTE — Telephone Encounter (Signed)
Pt is needing to talk with someone about changing the medication that dr Milus Glazier gave her this week it started with an S  It is too expensive and she needs something cheaper   Best number [phone]

## 2015-06-11 NOTE — Telephone Encounter (Signed)
Relayed message to patient when she called in this morning.   She is okay with going to [facility] and would like Dr. Elbert Ewings to proceed with the referral.

## 2015-07-14 ENCOUNTER — Telehealth: Payer: Self-pay

## 2015-07-20 ENCOUNTER — Ambulatory Visit (INDEPENDENT_AMBULATORY_CARE_PROVIDER_SITE_OTHER): Payer: BLUE CROSS/BLUE SHIELD | Admitting: Emergency Medicine

## 2015-07-20 VITALS — BP 142/80 | HR 73 | Temp 98.9°F | Resp 20 | Ht 63.0 in | Wt 246.0 lb

## 2015-07-20 DIAGNOSIS — R05 Cough: Secondary | ICD-10-CM | POA: Diagnosis not present

## 2015-07-20 DIAGNOSIS — M609 Myositis, unspecified: Secondary | ICD-10-CM

## 2015-07-20 DIAGNOSIS — R059 Cough, unspecified: Secondary | ICD-10-CM

## 2015-07-20 DIAGNOSIS — M791 Myalgia: Secondary | ICD-10-CM | POA: Diagnosis not present

## 2015-07-20 DIAGNOSIS — J029 Acute pharyngitis, unspecified: Secondary | ICD-10-CM

## 2015-07-20 DIAGNOSIS — IMO0001 Reserved for inherently not codable concepts without codable children: Secondary | ICD-10-CM

## 2015-07-20 LAB — POCT CBC
Granulocyte percent: 47 %G (ref 37–80)
HCT, POC: 38.3 % (ref 37.7–47.9)
Hemoglobin: 13.9 g/dL (ref 12.2–16.2)
Lymph, poc: 2.3 (ref 0.6–3.4)
MCH, POC: 31.8 pg — AB (ref 27–31.2)
MCHC: 36.2 g/dL — AB (ref 31.8–35.4)
MCV: 87.9 fL (ref 80–97)
MID (cbc): 0.5 (ref 0–0.9)
MPV: 6.2 fL (ref 0–99.8)
POC Granulocyte: 2.4 (ref 2–6.9)
POC LYMPH PERCENT: 43.4 %L (ref 10–50)
POC MID %: 9.6 %M (ref 0–12)
Platelet Count, POC: 331 10*3/uL (ref 142–424)
RBC: 4.36 M/uL (ref 4.04–5.48)
RDW, POC: 11.6 %
WBC: 5.2 10*3/uL (ref 4.6–10.2)

## 2015-07-20 LAB — POCT INFLUENZA A/B
Influenza A, POC: NEGATIVE
Influenza B, POC: NEGATIVE

## 2015-07-20 LAB — POCT RAPID STREP A (OFFICE): Rapid Strep A Screen: NEGATIVE

## 2015-07-20 LAB — CK: Total CK: 36 U/L (ref 7–177)

## 2015-07-20 MED ORDER — OSELTAMIVIR PHOSPHATE 75 MG PO CAPS: 75.0000 mg | ORAL_CAPSULE | Freq: Two times a day (BID) | ORAL | Status: DC

## 2015-07-20 NOTE — Patient Instructions (Addendum)
   IF you received an x-ray today, you will receive an invoice from [facility]. Please contact [facility] at [phone] with questions or concerns regarding your invoice.   IF you received labwork today, you will receive an invoice from [facility]. Please contact [facility] at [phone] with questions or concerns regarding your invoice.   Our billing staff will not be able to assist you with questions regarding bills from these companies.  You will be contacted with the lab results as soon as they are available. The fastest way to get your results is to activate your My Chart account. Instructions are located on the last page of this paperwork. If you have not heard from us regarding the results in 2 weeks, please contact this office.    Impingement Syndrome, Rotator Cuff, Bursitis With Rehab Impingement syndrome is a condition that involves inflammation of the tendons of the rotator cuff and the subacromial bursa, that causes pain in the shoulder. The rotator cuff consists of four tendons and muscles that control much of the shoulder and upper arm function. The subacromial bursa is a fluid filled sac that helps reduce friction between the rotator cuff and one of the bones of the shoulder (acromion). Impingement syndrome is usually an overuse injury that causes swelling of the bursa (bursitis), swelling of the tendon (tendonitis), and/or a tear of the tendon (strain). Strains are classified into three categories. Grade 1 strains cause pain, but the tendon is not lengthened. Grade 2 strains include a lengthened ligament, due to the ligament being stretched or partially ruptured. With grade 2 strains there is still function, although the function may be decreased. Grade 3 strains include a complete tear of the tendon or muscle, and function is usually impaired. SYMPTOMS   Pain around the shoulder, often at the outer portion of the upper arm.  Pain  that gets worse with shoulder function, especially when reaching overhead or lifting.  Sometimes, aching when not using the arm.  Pain that wakes you up at night.  Sometimes, tenderness, swelling, warmth, or redness over the affected area.  Loss of strength.  Limited motion of the shoulder, especially reaching behind the back (to the back pocket or to unhook bra) or across your body.  Crackling sound (crepitation) when moving the arm.  Biceps tendon pain and inflammation (in the front of the shoulder). Worse when bending the elbow or lifting. CAUSES  Impingement syndrome is often an overuse injury, in which chronic (repetitive) motions cause the tendons or bursa to become inflamed. A strain occurs when a force is paced on the tendon or muscle that is greater than it can withstand. Common mechanisms of injury include: Stress from sudden increase in duration, frequency, or intensity of training.  Direct hit (trauma) to the shoulder.  Aging, erosion of the tendon with normal use.  Bony bump on shoulder (acromial spur). RISK INCREASES WITH:  Contact sports (football, wrestling, boxing).  Throwing sports (baseball, tennis, volleyball).  Weightlifting and bodybuilding.  Heavy labor.  Previous injury to the rotator cuff, including impingement.  Poor shoulder strength and flexibility.  Failure to warm up properly before activity.  Inadequate protective equipment.  Old age.  Bony bump on shoulder (acromial spur). PREVENTION   Warm up and stretch properly before activity.  Allow for adequate recovery between workouts.  Maintain physical fitness:  Strength, flexibility, and endurance.  Cardiovascular fitness.  Learn and use proper exercise technique. PROGNOSIS  If treated properly, impingement syndrome usually goes   goes away within 6 weeks. Sometimes surgery is required.  RELATED COMPLICATIONS   Longer healing time if not properly treated, or if not given enough time to  heal.  Recurring symptoms, that result in a chronic condition.  Shoulder stiffness, frozen shoulder, or loss of motion.  Rotator cuff tendon tear.  Recurring symptoms, especially if activity is resumed too soon, with overuse, with a direct blow, or when using poor technique. TREATMENT  Treatment first involves the use of ice and medicine, to reduce pain and inflammation. The use of strengthening and stretching exercises may help reduce pain with activity. These exercises may be performed at home or with a therapist. If non-surgical treatment is unsuccessful after more than 6 months, surgery may be advised. After surgery and rehabilitation, activity is usually possible in 3 months.  MEDICATION  If pain medicine is needed, nonsteroidal anti-inflammatory medicines (aspirin and ibuprofen), or other minor pain relievers (acetaminophen), are often advised.  Do not take pain medicine for 7 days before surgery.  Prescription pain relievers may be given, if your caregiver thinks they are needed. Use only as directed and only as much as you need.  Corticosteroid injections may be given by your caregiver. These injections should be reserved for the most serious cases, because they may only be given a certain number of times. HEAT AND COLD  Cold treatment (icing) should be applied for 10 to 15 minutes every 2 to 3 hours for inflammation and pain, and immediately after activity that aggravates your symptoms. Use ice packs or an ice massage.  Heat treatment may be used before performing stretching and strengthening activities prescribed by your caregiver, physical therapist, or athletic trainer. Use a heat pack or a warm water soak. SEEK MEDICAL CARE IF:   Symptoms get worse or do not improve in 4 to 6 weeks, despite treatment.  New, unexplained symptoms develop. (Drugs used in treatment may produce side effects.)

## 2015-07-20 NOTE — Addendum Note (Signed)
Addended by: Lesle ChrisAUB, STEVEN A on: 07/20/2015 11:00 AM   Modules accepted: Orders

## 2015-07-20 NOTE — Progress Notes (Signed)
Patient ID: Ann PatchesHetty R Santos, female   DOB: 04/15/1981, 35 y.o.   MRN: [id number]     By signing my name below, I, Littie Deedsichard Sun, attest that this documentation has been prepared under the direction and in the presence of Lesle ChrisSteven Shariff Lasky, MD.  Electronically Signed: Littie Deedsichard Sun, Medical Scribe. 07/20/2015. 9:37 AM.   Chief Complaint:  Chief Complaint  Patient presents with  . Headache    x 1 week  . Cough  . Generalized Body Aches    HPI: Ann Santos is a 35 y.o. female who [facility] today complaining of gradual onset cough. Patient describes the cough as itchy and aggressive. She also reports having headache, generalized myalgias, fatigue, neck pain, and back pain. She has tried Tylenol. Patient denies sore throat and fever. She notes she has been exercising at the gym more and believes she pulled a muscle in her right shoulder while working out. She is unsure if this is related to her other pains. Patient was in [place] over the weekend with her husband, but she had to stay in the hotel most of the time because she was feeling ill.  She did not receive the flu shot this year.  Past Medical History  Diagnosis Date  . Diverticulitis     Per pt. States she never had CT scan to confirm this  . Diverticulosis     [facility], [facility]  . Umbilical hernia     planning surgical repair  . Constipation   . Abdominal distention   . Abdominal pain   . Blood in stool   . Rectal bleeding   . Hypertension     During pregnancy only  . Gestational hypertension 07/05/2013  . Maternal obesity, antepartum 07/05/2013  . Gestational hypertension 07/05/2013  . Maternal obesity, antepartum 07/05/2013  . Shortness of breath dyspnea     with exercise  . Anxiety   . Numbness     first two fingers right hand   . History of hiatal hernia   . History of chicken pox     childhood   Past Surgical History  Procedure Laterality Date  . Colonoscopy  10/07/2011    Procedure:  COLONOSCOPY;  Surgeon: Theda BelfastPatrick D Hung, MD;  Location: [facility];  Service: Endoscopy;  Laterality: N/A;  . Esophagogastroduodenoscopy  10/07/2011    Procedure: ESOPHAGOGASTRODUODENOSCOPY (EGD);  Surgeon: Theda BelfastPatrick D Hung, MD;  [facility];  Service: Endoscopy;  Laterality: N/A;  . Cesarean section  05/28/09  . Scar revision  09/17/09    c-section scar revision  . Umbilical hernia repair  11/01/2011    Procedure: HERNIA REPAIR UMBILICAL ADULT;  Surgeon: Velora Hecklerodd M Gerkin, MD;  Location: [facility];  Service: General;  Laterality: N/A;  . Cesarean section N/A 07/05/2013    Procedure: REPEAT CESAREAN SECTION (With Special Wound Vac);  Surgeon: Robley FriesVaishali R Mody, MD;  Location: [facility];  Service: Obstetrics;  Laterality: N/A;   EDD: 07/23/13  . Breath tek h pylori N/A 07/22/2014    Procedure: BREATH TEK H PYLORI;  Surgeon: Ovidio Kinavid Newman, MD;  [facility];  Service: General;  Laterality: N/A;  . Laparoscopic gastric sleeve resection with hiatal hernia repair N/A 11/17/2014    Procedure: LAPAROSCOPIC GASTRIC SLEEVE RESECTION ;  Surgeon: Ovidio Kinavid Newman, MD;  Location: [facility];  Service: General;  Laterality: N/A;   Social History   Social History  . Marital Status: Married    Spouse Name: N/A  . Number  of Children: N/A  . Years of Education: N/A   Social History Main Topics  . Smoking status: Never Smoker   . Smokeless tobacco: Never Used  . Alcohol Use: No  . Drug Use: No  . Sexual Activity: Yes   Other Topics Concern  . None   Social History Narrative   Family History  Problem Relation Age of Onset  . Diabetes Mother   . Hypertension Mother   . Ulcerative colitis Father   . Hypertension Father   . Cancer Maternal Grandmother     lung   No Known Allergies Prior to Admission medications   Medication Sig Start Date End Date Taking? Authorizing Provider  fluticasone (FLONASE) 50 MCG/ACT nasal spray Place 2 sprays into both nostrils daily as needed for allergies or rhinitis.  04/06/14  Yes Ofilia Neas, PA-C  Probiotic Product (PROBIOTIC COLON SUPPORT PO) Take 1 tablet by mouth daily.   Yes Historical Provider, MD  pseudoephedrine (SUDAFED) 120 MG 12 hr tablet Take 120 mg by mouth every 12 (twelve) hours as needed for congestion.   Yes Historical Provider, MD  sertraline (ZOLOFT) 100 MG tablet Take 1 tablet (100 mg total) by mouth at bedtime. 10/02/14  Yes Peyton Najjar, MD  traMADol (ULTRAM) 50 MG tablet TAKE 1 TABLET BY MOUTH EVERY 8 HOURS AS NEEDED FOR PAIN 11/20/14  Yes Ofilia Neas, PA-C  traZODone (DESYREL) 100 MG tablet Take 1 tablet (100 mg total) by mouth at bedtime as needed for sleep. 06/08/15  Yes Elvina Sidle, MD  acetaminophen (TYLENOL) 500 MG tablet Take 500 mg by mouth every 6 (six) hours as needed for moderate pain. Reported on 07/20/2015    Historical Provider, MD  Albuterol Sulfate (PROAIR RESPICLICK) 108 (90 Base) MCG/ACT AEPB Inhale 2 puffs into the lungs every 6 (six) hours as needed. Patient not taking: Reported on 07/20/2015 06/05/15   Elvina Sidle, MD  azithromycin (ZITHROMAX) 250 MG tablet Take 2 tabs PO x 1 dose, then 1 tab PO QD x 4 days Patient not taking: Reported on 07/20/2015 06/05/15   Elvina Sidle, MD  buPROPion (WELLBUTRIN XL) 150 MG 24 hr tablet Take 1 tablet (150 mg total) by mouth daily. Patient not taking: Reported on 07/20/2015 06/10/15   Elvina Sidle, MD  cetirizine (ZYRTEC) 10 MG tablet Take 10 mg by mouth daily as needed for allergies. Reported on 07/20/2015    Historical Provider, MD  CRYSELLE-28 0.3-30 MG-MCG tablet Take 1 tablet by mouth daily. Reported on 07/20/2015 10/30/14   Historical Provider, MD     ROS: The patient denies fevers, chills, night sweats, unintentional weight loss, chest pain, palpitations, wheezing, dyspnea on exertion, nausea, vomiting, abdominal pain, dysuria, hematuria, melena, numbness, weakness, or tingling.   All other systems have been reviewed and were otherwise negative with the exception  of those mentioned in the HPI and as above.    PHYSICAL EXAM: Filed Vitals:   07/20/15 0903  BP: 142/80  Pulse: 73  Temp: 98.9 F (37.2 C)  Resp: 20   Body mass index is 43.59 kg/(m^2).   General: Alert, no acute distress HEENT:  Normocephalic, atraumatic, oropharynx patent. Eye: Nonie Hoyer [facility] Cardiovascular:  Regular rate and rhythm, no rubs murmurs or gallops.  No Carotid bruits, radial pulse intact. No pedal edema.  Respiratory: Clear to auscultation bilaterally.  No wheezes, rales, or rhonchi.  No cyanosis, no use of accessory musculature Abdominal: No organomegaly, abdomen is soft and non-tender, positive bowel sounds.  No masses. Musculoskeletal:  Gait intact. No edema, tenderness Skin: No rashes. Neurologic: Facial musculature symmetric. Psychiatric: Patient acts appropriately throughout our interaction. Lymphatic: No cervical or submandibular lymphadenopathy    LABS: Results for orders placed or performed in visit on 07/20/15  POCT Influenza A/B  Result Value Ref Range   Influenza A, POC Negative Negative   Influenza B, POC Negative Negative  POCT rapid strep A  Result Value Ref Range   Rapid Strep A Screen Negative Negative  POCT CBC  Result Value Ref Range   WBC 5.2 4.6 - 10.2 K/uL   Lymph, poc 2.3 0.6 - 3.4   POC LYMPH PERCENT 43.4 10 - 50 %L   MID (cbc) 0.5 0 - 0.9   POC MID % 9.6 0 - 12 %M   POC Granulocyte 2.4 2 - 6.9   Granulocyte percent 47.0 37 - 80 %G   RBC 4.36 4.04 - 5.48 M/uL   Hemoglobin 13.9 12.2 - 16.2 g/dL   HCT, POC 16.1 09.6 - 47.9 %   MCV 87.9 80 - 97 fL   MCH, POC 31.8 (A) 27 - 31.2 pg   MCHC 36.2 (A) 31.8 - 35.4 g/dL   RDW, POC 04.5 %   Platelet Count, POC 331 142 - 424 K/uL   MPV 6.2 0 - 99.8 fL     EKG/XRAY:   Primary read interpreted by Dr. Cleta Alberts at [facility].   ASSESSMENT/PLAN: Patient having a lot of right shoulder discomfort as well as diffuse myalgias. Her blood count is normal small like flu. Strep and flu tests are  negative. A CK level was done she does have crepitation around the right shoulder. She will treat this with ice and rest for now. She will be placed on Tamiflu twice a day for 5 days.I personally performed the services described in this documentation, which was scribed in my presence. The recorded information has been reviewed and is accurate.    Gross sideeffects, risk and benefits, and alternatives of medications d/w patient. Patient is aware that all medications have potential sideeffects and we are unable to predict every sideeffect or drug-drug interaction that may occur.  Lesle Chris MD 07/20/2015 9:37 AM

## 2015-07-28 DIAGNOSIS — F4323 Adjustment disorder with mixed anxiety and depressed mood: Secondary | ICD-10-CM | POA: Diagnosis not present

## 2015-08-11 DIAGNOSIS — F4323 Adjustment disorder with mixed anxiety and depressed mood: Secondary | ICD-10-CM | POA: Diagnosis not present

## 2015-08-14 ENCOUNTER — Encounter (INDEPENDENT_AMBULATORY_CARE_PROVIDER_SITE_OTHER): Payer: Self-pay

## 2015-08-14 ENCOUNTER — Encounter (HOSPITAL_COMMUNITY): Payer: Self-pay | Admitting: Psychiatry

## 2015-08-14 ENCOUNTER — Ambulatory Visit (INDEPENDENT_AMBULATORY_CARE_PROVIDER_SITE_OTHER): Payer: BLUE CROSS/BLUE SHIELD | Admitting: Psychiatry

## 2015-08-14 VITALS — BP 128/80 | HR 77 | Ht 62.0 in | Wt 239.0 lb

## 2015-08-14 DIAGNOSIS — F909 Attention-deficit hyperactivity disorder, unspecified type: Secondary | ICD-10-CM

## 2015-08-14 DIAGNOSIS — F988 Other specified behavioral and emotional disorders with onset usually occurring in childhood and adolescence: Secondary | ICD-10-CM

## 2015-08-14 MED ORDER — LISDEXAMFETAMINE DIMESYLATE 20 MG PO CAPS: 20.0000 mg | ORAL_CAPSULE | Freq: Every day | ORAL | Status: DC

## 2015-08-14 NOTE — Progress Notes (Signed)
[facility] Initial Assessment Note  Ann Santos [id number] 35 y.o.  08/14/2015 12:23 PM  Chief Complaint:  I have trouble in focusing things.  I have difficulty multitasking.  I'm afraid I will lose my job.  I cannot finish my task on time.  Ann Santos some help.  History of Present Illness:  Patient is 35 year old Caucasian, employed, married female who is self-referred for seeking management for his underlying psychiatric illness.  Patient notices most of her life she has difficulty in her attention, focus and multitasking.  However lately the symptoms are getting worse.  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  She easily forgets.  She gets easily irritable and emotional.  She is working multiple jobs but she has noticed that her coworker noticing that she was unable to finish the job on time.  She is very anxious and worried.  She admitted getting easily emotional, crying spells, distracted.  Patient endorsed multiple stressors as she has to work multiple jobs, husband has a history of drug addiction, financial distress and paying the bills on time.  She endorse decreased energy, racing thoughts, anxious about her future, getting overwhelmed and feels sometimes hopeless and helpless.  She is taking Zoloft and trazodone prescribed by her primary care physician which is helping her depression but she does not see any improvement in her attention and focus.  Recently she tried Strattera prescribed by primary care physician however she slapped all day and she did not like it.  Patient denies any mania, psychosis, hallucination, suicidal thoughts, homicidal thought, aggressive behavior or any self abusive behavior.  She lives with her husband and 2 children who are 35-year-old and 35-year-old.  Her job is stressful and she's been working for more than 10 years.  Patient remember having issues at school with  attention and focus but she never had tested for ADD.  She remember not having good grades in math but she able to finish graduation.  Patient denies drinking or using any illegal substances.  She like to try a stimulant to help her focus.  Patient denies any grandiosity, aggressive behavior, delusions or any impulsive behavior.  However admitted history of irritability and impulsive buying the do not have issues with the law.  Patient denies any OCD or any PTSD symptoms.  Patient is seeing therapist for marriage counseling with her husband and that seems to be working well.    Suicidal Ideation: No Plan Formed: No Patient has means to carry out plan: No  Homicidal Ideation: No Plan Formed: No Patient has means to carry out plan: No  Past Psychiatric History/hospitalization; Patient is stopped taking antidepressant 8 years ago when she was in [place].  At that time her husband was going through drug addiction and she was very depressed.  She had tried Celexa, Wellbutrin, Prozac and recently Strattera.  She's been taking Zoloft and trazodone for quite a few years but is working very well.  She had briefly seen psychiatrist when she [place] but most of the time her medicines were given by her primary care physician.  Patient denies any history of psychiatric inpatient treatment.  Patient denies any history of mania, psychosis, hallucination or any suicidal attempt. Anxiety: Yes Bipolar Disorder: No Depression: Yes Mania: No Psychosis: No Schizophrenia: No Personality Disorder: No Hospitalization for psychiatric illness: No History of Electroconvulsive Shock Therapy: No Prior Suicide Attempts: No  Family History;  Patient endorse family member has schizophrenia and bipolar disorder.  Medical history. Patient has history of diverticulitis, B12 deficiency, hypertension, GERD, obesity and history of gastric bypass.  She had lost 70 pounds since she had gastric bypass surgery in 2016.  Patient  denies any history of traumatic brain injury.    Psychosocial history . Patient born in [place] and raised in [place], [place], [place].  Her parents divorced when she was young.  Her mom lives with her sister and patient has very close contact with her mom and her aunt.  Patient also see her father on a regular basis.  She has 2 children from her current husband.  Her husband used to have addiction with heroine and pain medication but claims to be sober in recent years.  Traumatic brain injury. Patient denies any history of traumatic brain injury.  Education and work history. 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  She is also doing after hours to support the family.  Legal history. Patient denies any legal issues.    History Of Abuse; Patient denies any history of physical sexual verbal abuse.  Substance abuse history. Patient denies any history of current use but admitted history of drinking and using drugs when she was in the college.  She had tried cocaine and marijuana and active see.  She denies any intravenous drug use.  Review of Systems: Psychiatric: Agitation: No Hallucination: No Depressed Mood: Yes Insomnia: No Hypersomnia: No Altered Concentration: No Feels Worthless: No Grandiose Ideas: No Belief In Special Powers: No New/Increased Substance Abuse: No Compulsions: No  Neurologic: Headache: No Seizure: No Paresthesias: No   Outpatient Encounter Prescriptions as of 08/14/2015  Medication Sig  . acetaminophen (TYLENOL) 500 MG tablet Take 500 mg by mouth every 6 (six) hours as needed for moderate pain. Reported on 07/20/2015  . calcium acetate (PHOSLO) 667 MG capsule Take by mouth 3 (three) times daily with meals.  . cetirizine (ZYRTEC) 10 MG tablet Take 10 mg by mouth daily as needed for allergies. Reported on 07/20/2015  . CRYSELLE-28 0.3-30 MG-MCG tablet Take 1 tablet by mouth daily. Reported on  07/20/2015  . fluticasone (FLONASE) 50 MCG/ACT nasal spray Place 2 sprays into both nostrils daily as needed for allergies or rhinitis.  . Multiple Vitamins-Minerals (MULTIVITAMIN WITH MINERALS) tablet Take 1 tablet by mouth daily.  . Probiotic Product (PROBIOTIC COLON SUPPORT PO) Take 1 tablet by mouth daily.  . pseudoephedrine (SUDAFED) 120 MG 12 hr tablet Take 120 mg by mouth every 12 (twelve) hours as needed for congestion.  . sertraline (ZOLOFT) 100 MG tablet Take 1 tablet (100 mg total) by mouth at bedtime.  . traZODone (DESYREL) 100 MG tablet Take 1 tablet (100 mg total) by mouth at bedtime as needed for sleep.  . vitamin B-12 (CYANOCOBALAMIN) 100 MCG tablet Take 100 mcg by mouth daily.  . [DISCONTINUED] traMADol (ULTRAM) 50 MG tablet TAKE 1 TABLET BY MOUTH EVERY 8 HOURS AS NEEDED FOR PAIN  . lisdexamfetamine (VYVANSE) 20 MG capsule Take 1 capsule (20 mg total) by mouth daily.  . [DISCONTINUED] Albuterol Sulfate (PROAIR RESPICLICK) 108 (90 Base) MCG/ACT AEPB Inhale 2 puffs into the lungs every 6 (six) hours as needed. (Patient not taking: Reported on 07/20/2015)  . [DISCONTINUED] azithromycin (ZITHROMAX) 250 MG tablet Take 2 tabs PO x 1 dose, then 1 tab PO QD x 4 days (Patient not taking: Reported on 07/20/2015)  . [DISCONTINUED] buPROPion (WELLBUTRIN XL) 150 MG  24 hr tablet Take 1 tablet (150 mg total) by mouth daily. (Patient not taking: Reported on 07/20/2015)  . [DISCONTINUED] oseltamivir (TAMIFLU) 75 MG capsule Take 1 capsule (75 mg total) by mouth 2 (two) times daily. (Patient not taking: Reported on 08/14/2015)   No facility-administered encounter medications on file as of 08/14/2015.    Recent Results (from the past 2160 hour(s))  CK     Status: None   Collection Time: 07/20/15  9:59 AM  Result Value Ref Range   Total CK 36 7 - 177 U/L  POCT rapid strep A     Status: None   Collection Time: 07/20/15 10:03 AM  Result Value Ref Range   Rapid Strep A Screen Negative Negative  POCT  CBC     Status: Abnormal   Collection Time: 07/20/15 10:03 AM  Result Value Ref Range   WBC 5.2 4.6 - 10.2 K/uL   Lymph, poc 2.3 0.6 - 3.4   POC LYMPH PERCENT 43.4 10 - 50 %L   MID (cbc) 0.5 0 - 0.9   POC MID % 9.6 0 - 12 %M   POC Granulocyte 2.4 2 - 6.9   Granulocyte percent 47.0 37 - 80 %G   RBC 4.36 4.04 - 5.48 M/uL   Hemoglobin 13.9 12.2 - 16.2 g/dL   HCT, POC 16.1 09.6 - 47.9 %   MCV 87.9 80 - 97 fL   MCH, POC 31.8 (A) 27 - 31.2 pg   MCHC 36.2 (A) 31.8 - 35.4 g/dL   RDW, POC 04.5 %   Platelet Count, POC 331 142 - 424 K/uL   MPV 6.2 0 - 99.8 fL  POCT Influenza A/B     Status: None   Collection Time: 07/20/15 10:04 AM  Result Value Ref Range   Influenza A, POC Negative Negative   Influenza B, POC Negative Negative      Constitutional:  BP 128/80 mmHg  Pulse 77  Ht  (1.575 m)  Wt 239 lb (108.41 kg)  BMI 43.70 kg/m2  LMP 07/18/2015   Musculoskeletal: Strength & Muscle Tone: within normal limits Gait & Station: normal Patient leans: N/A  Psychiatric Specialty Exam: General Appearance: Casual  Eye Contact::  Good  Speech:  Normal Rate  Volume:  Normal  Mood:  Anxious  Affect:  Congruent  Thought Process:  Circumstantial  Orientation:  Full (Time, Place, and Person)  Thought Content:  Rumination  Suicidal Thoughts:  No  Homicidal Thoughts:  No  Memory:  Immediate;   Fair Recent;   Fair Remote;   Fair  Judgement:  Intact  Insight:  Fair  Psychomotor Activity:  Increased  Concentration:  Fair  Recall:  [employer] of Knowledge:  Fair  Language:  Fair  Akathisia:  No  Handed:  Right  AIMS (if indicated):     Assets:  Communication Skills Desire for Improvement Housing Physical Health  ADL's:  Intact  Cognition:  WNL  Sleep:        New problem, with additional work up planned, Review of Psycho-Social Stressors (1), Review or order clinical lab tests (1), Decision to obtain old records (1), Review and summation of old records (2), Established  Problem, Worsening (2), New Problem, with no additional work-up planned (3), Review of Medication Regimen & Side Effects (2) and Review of New Medication or Change in Dosage (2)   Assessment: Axis I: Major depressive disorder, mild recurrent.  Attention deficit disorder by history   Axis II: Deferred  Axis III:  Past Medical History  Diagnosis Date  . Diverticulitis     Per pt. States she never had CT scan to confirm this  . Diverticulosis     PT [facility]ITALIZED AT [facility] October 03, 2011  . Umbilical hernia     planning surgical repair  . Constipation   . Abdominal distention   . Abdominal pain   . Blood in stool   . Rectal bleeding   . Hypertension     During pregnancy only  . Gestational hypertension 07/05/2013  . Maternal obesity, antepartum 07/05/2013  . Gestational hypertension 07/05/2013  . Maternal obesity, antepartum 07/05/2013  . Shortness of breath dyspnea     with exercise  . Anxiety   . Numbness     first two fingers right hand   . History of hiatal hernia   . History of chicken pox     childhood     Plan:  I review her symptoms, history, current medication and recent blood work results, psychosocial stressors and collateral information.  Patient is taking Zoloft 100 mg and trazodone 50 mg at bedtime.  Her depression is well controlled but she continues to have trouble in focus, attention and multitasking.  I suggested low-dose Vyvanse however explained that any stimulant may cause worsening of anxiety, insomnia and panic attacks.  Patient realized the side effects and she like to try low-dose Vyvanse.  I also suggested that she should think about psychological testing if stimulant does not help her.  We will try Vyvanse 20 mg daily.  She will continue Zoloft and trazodone prescribed by her primary care physician.  Recommended to call us back if she has any question, concern she feels worsening of the symptom.  She is seeing therapist with her husband for a marriage counseling  and that seems to be going very well.  Recommended to call us back if she is any question, concern she feels worsening of symptoms.  I will see her again in 3 weeks.  Discuss safety plan that anytime having active suicidal thoughts or homicidal thoughts and she need to call 911 or go to the local emergency room.  Ayrabella Labombard T., MD 08/14/2015

## 2015-08-18 DIAGNOSIS — F4323 Adjustment disorder with mixed anxiety and depressed mood: Secondary | ICD-10-CM | POA: Diagnosis not present

## 2015-08-25 DIAGNOSIS — F4323 Adjustment disorder with mixed anxiety and depressed mood: Secondary | ICD-10-CM | POA: Diagnosis not present

## 2015-09-01 DIAGNOSIS — F4323 Adjustment disorder with mixed anxiety and depressed mood: Secondary | ICD-10-CM | POA: Diagnosis not present

## 2015-09-03 ENCOUNTER — Encounter (HOSPITAL_COMMUNITY): Payer: Self-pay | Admitting: Psychiatry

## 2015-09-03 ENCOUNTER — Ambulatory Visit (INDEPENDENT_AMBULATORY_CARE_PROVIDER_SITE_OTHER): Payer: BLUE CROSS/BLUE SHIELD | Admitting: Psychiatry

## 2015-09-03 VITALS — BP 128/80 | HR 77 | Ht 62.0 in | Wt 237.0 lb

## 2015-09-03 DIAGNOSIS — F330 Major depressive disorder, recurrent, mild: Secondary | ICD-10-CM

## 2015-09-03 DIAGNOSIS — F909 Attention-deficit hyperactivity disorder, unspecified type: Secondary | ICD-10-CM

## 2015-09-03 DIAGNOSIS — F419 Anxiety disorder, unspecified: Secondary | ICD-10-CM | POA: Diagnosis not present

## 2015-09-03 DIAGNOSIS — F988 Other specified behavioral and emotional disorders with onset usually occurring in childhood and adolescence: Secondary | ICD-10-CM

## 2015-09-03 MED ORDER — TRAZODONE HCL 100 MG PO TABS: ORAL_TABLET | ORAL | Status: DC

## 2015-09-03 MED ORDER — LISDEXAMFETAMINE DIMESYLATE 30 MG PO CAPS: 30.0000 mg | ORAL_CAPSULE | Freq: Every day | ORAL | Status: DC

## 2015-09-03 MED ORDER — SERTRALINE HCL 100 MG PO TABS: 100.0000 mg | ORAL_TABLET | Freq: Every day | ORAL | Status: DC

## 2015-09-03 NOTE — Progress Notes (Signed)
[facility] [address] Progress Note  Ann Santos [id number] 35 y.o.  09/03/2015 2:25 PM  Chief Complaint:  I am feeling better with new medication.  I was feeling very good until last week I felt that medicine is no more working.   History of Present Illness:  Ann Santos is a 35 year old Caucasian employed married female who was seen first time on April 21 for initial evaluation.  She was self referred for seeking management for ADD symptoms.  She had a history of depression and anxiety but lately her ADD symptoms are getting worse.  We started her on low-dose Vyvanse and she has noticed improvement in first week.  She was able to finish her job on time.  She was able to do multitasking.  Her attention and concentration also improved however last week she noticed slipping back into symptoms.  She started to get very tired again.  She is relieved that her husband finally got a good job in [place] and now moving next week.  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  She admitted her work is very challenging and she has difficulty doing tasks on time.  However Vyvanse helped finishing her task.  She denies any side effects.  She denies any tremors, shakes or any worsening of anxiety.  Her sleep is better.  There are times that she only took half trazodone and it helped.  She's also taking Zoloft prescribed by primary care physician for depression.  Her energy level is improved.  She has no chest pain, shakes or tremors.  She is wondering if the dose can be further increase .  Patient denies drinking alcohol or using any illegal substances.  She lives with her husband and 2 children who are clear and 105-year-old.  Her appetite is okay.  Her vitals are stable.  Suicidal Ideation: No Plan Formed: No Patient has means to carry out plan: No  Homicidal Ideation: No Plan Formed: No Patient has means to carry out plan: No  Past Psychiatric  History/hospitalization; Patient started taking antidepressant 8 years ago when she was in [place].  At that time her husband was going through drug addiction and she was very depressed.  She had tried Celexa, Wellbutrin, Prozac and recently Strattera.  She's been taking Zoloft and trazodone for quite a few years but is working very well.  She had briefly seen psychiatrist when she was [facility] but most of the time her medicines were given by her primary care physician.  Patient denies any history of psychiatric inpatient treatment.  Patient denies any history of mania, psychosis, hallucination or any suicidal attempt. Anxiety: Yes Bipolar Disorder: No Depression: Yes Mania: No Psychosis: No Schizophrenia: No Personality Disorder: No Hospitalization for psychiatric illness: No History of Electroconvulsive Shock Therapy: No Prior Suicide Attempts: No  Family History; Patient reported family member has schizophrenia and bipolar disorder.  Medical history. Patient has history of diverticulitis, B12 deficiency, hypertension, GERD, obesity and history of gastric bypass.  She had lost 70 pounds since she had gastric bypass surgery in 2016.  Patient denies any history of traumatic brain injury.    Psychosocial history . Patient born in [place] and raised in [place], [place], [place].  Her parents divorced when she was young.  Her mom lives with her sister and patient has very close contact with her mom and her aunt.  Patient also see her father on a regular basis.  She has 2 children  from her current husband.  Her husband used to have addiction with heroine and pain medication but claims to be sober in recent years.  Review of Systems: Psychiatric: Agitation: No Hallucination: No Depressed Mood: No Insomnia: No Hypersomnia: No Altered Concentration: No Feels Worthless: No Grandiose Ideas: No Belief In Special Powers: No New/Increased Substance Abuse: No Compulsions:  No  Neurologic: Headache: No Seizure: No Paresthesias: No   Outpatient Encounter Prescriptions as of 09/03/2015  Medication Sig  . acetaminophen (TYLENOL) 500 MG tablet Take 500 mg by mouth every 6 (six) hours as needed for moderate pain. Reported on 07/20/2015  . calcium acetate (PHOSLO) 667 MG capsule Take by mouth 3 (three) times daily with meals.  . cetirizine (ZYRTEC) 10 MG tablet Take 10 mg by mouth daily as needed for allergies. Reported on 07/20/2015  . CRYSELLE-28 0.3-30 MG-MCG tablet Take 1 tablet by mouth daily. Reported on 07/20/2015  . fluticasone (FLONASE) 50 MCG/ACT nasal spray Place 2 sprays into both nostrils daily as needed for allergies or rhinitis.  Marland Kitchen. lisdexamfetamine (VYVANSE) 30 MG capsule Take 1 capsule (30 mg total) by mouth daily.  . Multiple Vitamins-Minerals (MULTIVITAMIN WITH MINERALS) tablet Take 1 tablet by mouth daily.  . Probiotic Product (PROBIOTIC COLON SUPPORT PO) Take 1 tablet by mouth daily.  . sertraline (ZOLOFT) 100 MG tablet Take 1 tablet (100 mg total) by mouth at bedtime.  . traZODone (DESYREL) 100 MG tablet Take 1/2 to 1 tab at bed time  . vitamin B-12 (CYANOCOBALAMIN) 100 MCG tablet Take 100 mcg by mouth daily.  . [DISCONTINUED] lisdexamfetamine (VYVANSE) 20 MG capsule Take 1 capsule (20 mg total) by mouth daily.  . [DISCONTINUED] lisdexamfetamine (VYVANSE) 30 MG capsule Take 1 capsule (30 mg total) by mouth daily.  . [DISCONTINUED] lisdexamfetamine (VYVANSE) 30 MG capsule Take 1 capsule (30 mg total) by mouth daily.  . [DISCONTINUED] pseudoephedrine (SUDAFED) 120 MG 12 hr tablet Take 120 mg by mouth every 12 (twelve) hours as needed for congestion.  . [DISCONTINUED] sertraline (ZOLOFT) 100 MG tablet Take 1 tablet (100 mg total) by mouth at bedtime.  . [DISCONTINUED] traZODone (DESYREL) 100 MG tablet Take 1 tablet (100 mg total) by mouth at bedtime as needed for sleep.   No facility-administered encounter medications on file as of 09/03/2015.     Recent Results (from the past 2160 hour(s))  CK     Status: None   Collection Time: 07/20/15  9:59 AM  Result Value Ref Range   Total CK 36 7 - 177 U/L  POCT rapid strep A     Status: None   Collection Time: 07/20/15 10:03 AM  Result Value Ref Range   Rapid Strep A Screen Negative Negative  POCT CBC     Status: Abnormal   Collection Time: 07/20/15 10:03 AM  Result Value Ref Range   WBC 5.2 4.6 - 10.2 K/uL   Lymph, poc 2.3 0.6 - 3.4   POC LYMPH PERCENT 43.4 10 - 50 %L   MID (cbc) 0.5 0 - 0.9   POC MID % 9.6 0 - 12 %M   POC Granulocyte 2.4 2 - 6.9   Granulocyte percent 47.0 37 - 80 %G   RBC 4.36 4.04 - 5.48 M/uL   Hemoglobin 13.9 12.2 - 16.2 g/dL   HCT, POC 40.938.3 81.137.7 - 47.9 %   MCV 87.9 80 - 97 fL   MCH, POC 31.8 (A) 27 - 31.2 pg   MCHC 36.2 (A) 31.8 - 35.4 g/dL  RDW, POC 11.6 %   Platelet Count, POC 331 142 - 424 K/uL   MPV 6.2 0 - 99.8 fL  POCT Influenza A/B     Status: None   Collection Time: 07/20/15 10:04 AM  Result Value Ref Range   Influenza A, POC Negative Negative   Influenza B, POC Negative Negative      Constitutional:  BP 128/80 mmHg  Pulse 77  Ht  (1.575 m)  Wt 237 lb (107.502 kg)  BMI 43.34 kg/m2   Musculoskeletal: Strength & Muscle Tone: within normal limits Gait & Station: normal Patient leans: N/A  Psychiatric Specialty Exam: General Appearance: Casual  Eye Contact::  Good  Speech:  Normal Rate  Volume:  Normal  Mood:  Anxious  Affect:  Congruent  Thought Process:  Circumstantial  Orientation:  Full (Time, Place, and Person)  Thought Content:  Rumination  Suicidal Thoughts:  No  Homicidal Thoughts:  No  Memory:  Immediate;   Fair Recent;   Fair Remote;   Fair  Judgement:  Intact  Insight:  Fair  Psychomotor Activity:  Increased  Concentration:  Fair  Recall:  [employer] of Knowledge:  Fair  Language:  Fair  Akathisia:  No  Handed:  Right  AIMS (if indicated):     Assets:  Communication Skills Desire for  Improvement Housing Physical Health  ADL's:  Intact  Cognition:  WNL  Sleep:        Established Problem, Stable/Improving (1), Review of Psycho-Social Stressors (1), Review of Last Therapy Session (1), Review of Medication Regimen & Side Effects (2) and Review of New Medication or Change in Dosage (2)   Assessment: Axis I: Major depressive disorder, mild recurrent.  Attention deficit disorder by history   Axis II: Deferred  Axis III:  Past Medical History  Diagnosis Date  . Diverticulitis     Per pt. States she never had CT scan to confirm this  . Diverticulosis     PT HOSPITALIZED AT [facility] October 03, 2011  . Umbilical hernia     planning surgical repair  . Constipation   . Abdominal distention   . Abdominal pain   . Blood in stool   . Rectal bleeding   . Hypertension     During pregnancy only  . Gestational hypertension 07/05/2013  . Maternal obesity, antepartum 07/05/2013  . Gestational hypertension 07/05/2013  . Maternal obesity, antepartum 07/05/2013  . Shortness of breath dyspnea     with exercise  . Anxiety   . Numbness     first two fingers right hand   . History of hiatal hernia   . History of chicken pox     childhood     Plan:  Patient shown improvement with Vyvanse.  She reported no side effects.  We will try 30 mg to help her residual ADD symptoms.  However I explained that increasing dose of the Vyvanse may cause worsening anxiety.  Patient is aware about and will call us back if symptoms of anxiety get worse.  She like to get refills for her Zoloft and trazodone from this office.  We will write a prescription for Zoloft 100 mg daily and trazodone 100 mg half to one tablet as needed for insomnia.  Discussed medication side effects and benefits.  Recommended to call us back if she has any question, concern or if she feels worsening of the symptom.  I will see her again in 3 months area.   She is seeing  therapist with her husband for a marriage counseling and that  seems to be going very well. Discuss safety plan that anytime having active suicidal thoughts or homicidal thoughts and she need to call 911 or go to the local emergency room.  Sharnell Knight T., MD 09/03/2015

## 2015-09-22 DIAGNOSIS — F4323 Adjustment disorder with mixed anxiety and depressed mood: Secondary | ICD-10-CM | POA: Diagnosis not present

## 2015-10-06 DIAGNOSIS — F4323 Adjustment disorder with mixed anxiety and depressed mood: Secondary | ICD-10-CM | POA: Diagnosis not present

## 2015-10-26 DIAGNOSIS — F4323 Adjustment disorder with mixed anxiety and depressed mood: Secondary | ICD-10-CM | POA: Diagnosis not present

## 2015-11-07 ENCOUNTER — Ambulatory Visit (INDEPENDENT_AMBULATORY_CARE_PROVIDER_SITE_OTHER): Payer: BLUE CROSS/BLUE SHIELD | Admitting: Physician Assistant

## 2015-11-07 ENCOUNTER — Encounter: Payer: Self-pay | Admitting: Physician Assistant

## 2015-11-07 VITALS — BP 122/72 | HR 74 | Temp 98.4°F | Resp 17 | Ht 63.0 in | Wt 236.0 lb

## 2015-11-07 DIAGNOSIS — L200 Besnier's prurigo: Secondary | ICD-10-CM

## 2015-11-07 DIAGNOSIS — L239 Allergic contact dermatitis, unspecified cause: Secondary | ICD-10-CM

## 2015-11-07 DIAGNOSIS — R21 Rash and other nonspecific skin eruption: Secondary | ICD-10-CM | POA: Diagnosis not present

## 2015-11-07 LAB — POCT SKIN KOH: Skin KOH, POC: NEGATIVE

## 2015-11-07 MED ORDER — ZINC OXIDE 20 % EX OINT: 1.0000 "application " | TOPICAL_OINTMENT | CUTANEOUS | Status: DC | PRN

## 2015-11-07 MED ORDER — WHITE PETROLATUM GEL: 1.0000 "application " | Status: DC | PRN

## 2015-11-07 MED ORDER — CETIRIZINE HCL 10 MG PO TABS: 10.0000 mg | ORAL_TABLET | Freq: Every day | ORAL | Status: DC

## 2015-11-07 MED ORDER — PREDNISONE 20 MG PO TABS: 40.0000 mg | ORAL_TABLET | Freq: Every day | ORAL | Status: AC

## 2015-11-07 NOTE — Patient Instructions (Signed)
     IF you received an x-ray today, you will receive an invoice from [facility]. Please contact [facility] at [phone] with questions or concerns regarding your invoice.   IF you received labwork today, you will receive an invoice from [facility]. Please contact [facility] at [phone] with questions or concerns regarding your invoice.   Our billing staff will not be able to assist you with questions regarding bills from these companies.  You will be contacted with the lab results as soon as they are available. The fastest way to get your results is to activate your My Chart account. Instructions are located on the last page of this paperwork. If you have not heard from us regarding the results in 2 weeks, please contact this office.      

## 2015-11-07 NOTE — Progress Notes (Signed)
11/07/2015 10:02 AM   DOB: 28-Jul-1980 / MRN: [id number]  SUBJECTIVE:  Ann Santos is a 35 y.o. female presenting for rash that appeared after doing yard work two weeks ago.  States the rash started on her breast and since that time states the rash has spread to her inguinal creases, bilateral arms, and she states that her face started itching 3 days ago.  She denies changes in vision.  She has tried oatmeal baths, aloe, and multiple other cremas.  She does not know what else to do to stop the itching.   She has No Known Allergies.   She  has a past medical history of Diverticulitis; Diverticulosis; Umbilical hernia; Constipation; Abdominal distention; Abdominal pain; Blood in stool; Rectal bleeding; Hypertension; Gestational hypertension (07/05/2013); Maternal obesity, antepartum (07/05/2013); Gestational hypertension (07/05/2013); Maternal obesity, antepartum (07/05/2013); Shortness of breath dyspnea; Anxiety; Numbness; History of hiatal hernia; and History of chicken pox.    She  reports that she has never smoked. She has never used smokeless tobacco. She reports that she does not drink alcohol or use illicit drugs. She  reports that she currently engages in sexual activity. The patient  has past surgical history that includes Colonoscopy (10/07/2011); Esophagogastroduodenoscopy (10/07/2011); Cesarean section (05/28/09); Scar revision (09/17/09); Umbilical hernia repair (11/01/2011); Cesarean section (N/A, 07/05/2013); Breath tek h pylori (N/A, 07/22/2014); and Laparoscopic gastric sleeve resection with hiatal hernia repair (N/A, 11/17/2014).  Her family history includes Cancer in her maternal grandmother; Diabetes in her mother; Hypertension in her father and mother; Ulcerative colitis in her father.  Review of Systems  Constitutional: Negative for fever and chills.  Eyes: Negative for blurred vision and pain.  Respiratory: Negative for cough.   Cardiovascular: Negative for chest pain.  Gastrointestinal:  Negative for nausea.  Skin: Positive for itching and rash.  Neurological: Negative for dizziness and headaches.    Problem list and medications reviewed and updated by myself where necessary, and exist elsewhere in the encounter.   OBJECTIVE:  BP 122/72 mmHg  Pulse 74  Temp(Src) 98.4 F (36.9 C) (Oral)  Resp 17  Ht  (1.6 m)  Wt 236 lb (107.049 kg)  BMI 41.82 kg/m2  SpO2 98%  LMP 10/24/2015  No results found for: HGBA1C  Lab Results  Component Value Date   NA 139 11/13/2014   K 3.9 11/13/2014   CL 107 11/13/2014   CO2 25 11/13/2014   Last Glucose 89 on 10/02/14  Physical Exam  Constitutional: Vital signs are normal. She appears well-developed and well-nourished.  Cardiovascular: Normal rate and regular rhythm.   Pulmonary/Chest: Effort normal and breath sounds normal.  Abdominal: Soft.  Skin: Rash (generalized, present on the skin overlying the left zygomatic arch. ) noted. No burn, no lesion and no purpura noted. Rash is macular. Rash is not papular, not maculopapular, not nodular, not pustular, not vesicular and not urticarial.    Results for orders placed or performed in visit on 11/07/15 (from the past 72 hour(s))  POCT Skin KOH     Status: None   Collection Time: 11/07/15 10:00 AM  Result Value Ref Range   Skin KOH, POC Negative     No results found.  ASSESSMENT AND PLAN  Anvika was seen today for rash and poison ivy.  Diagnoses and all orders for this visit:  Rash and nonspecific skin eruption -     POCT Skin KOH  Allergic dermatitis: HPI consistent with poison IVY/OAK.  The rash has been present for two weeks.  She is miserable.  Will start a medium dose of pred to help her symptomatically and advised several otc preps that should also help.  -     predniSONE (DELTASONE) 20 MG tablet; Take 2 tablets (40 mg total) by mouth daily with breakfast. -     cetirizine (ZYRTEC ALLERGY) 10 MG tablet; Take 1 tablet (10 mg total) by mouth daily. -     white  petrolatum (VASELINE) GEL; Apply 1 application topically as needed for lip care or dry skin. -     zinc oxide ([employer] ZINC OXIDE) 20 % ointment; Apply 1 application topically as needed for irritation.    The patient was advised to call or return to clinic if she does not see an improvement in symptoms, or to seek the care of the closest emergency department if she worsens with the above plan.   Deliah BostonMichael Clark, MHS, PA-C [facility] 11/07/2015 10:02 AM

## 2015-11-10 DIAGNOSIS — F4323 Adjustment disorder with mixed anxiety and depressed mood: Secondary | ICD-10-CM | POA: Diagnosis not present

## 2015-11-12 DIAGNOSIS — Z903 Acquired absence of stomach [part of]: Secondary | ICD-10-CM | POA: Diagnosis not present

## 2015-11-12 DIAGNOSIS — K432 Incisional hernia without obstruction or gangrene: Secondary | ICD-10-CM | POA: Diagnosis not present

## 2015-11-12 DIAGNOSIS — Z6841 Body Mass Index (BMI) 40.0 and over, adult: Secondary | ICD-10-CM | POA: Diagnosis not present

## 2015-11-24 DIAGNOSIS — F4323 Adjustment disorder with mixed anxiety and depressed mood: Secondary | ICD-10-CM | POA: Diagnosis not present

## 2015-12-04 ENCOUNTER — Ambulatory Visit (HOSPITAL_COMMUNITY): Payer: Self-pay | Admitting: Psychiatry

## 2015-12-07 ENCOUNTER — Telehealth (HOSPITAL_COMMUNITY): Payer: Self-pay

## 2015-12-07 DIAGNOSIS — F988 Other specified behavioral and emotional disorders with onset usually occurring in childhood and adolescence: Secondary | ICD-10-CM

## 2015-12-07 NOTE — Telephone Encounter (Signed)
Ann Santos patient is calling for a refill on Vyvanse, patient is due and has a follow up at the beginning of Oct.

## 2015-12-08 ENCOUNTER — Telehealth (HOSPITAL_COMMUNITY): Payer: Self-pay

## 2015-12-08 MED ORDER — LISDEXAMFETAMINE DIMESYLATE 30 MG PO CAPS: 30.0000 mg | ORAL_CAPSULE | Freq: Every day | ORAL | 0 refills | Status: DC

## 2015-12-08 NOTE — Telephone Encounter (Signed)
Prescription is printed and signed, Lupita LeashDonna called patient to let her know it is ready for pick up

## 2015-12-08 NOTE — Telephone Encounter (Signed)
Yes we can refill 

## 2015-12-08 NOTE — Telephone Encounter (Signed)
Ann Santos picked up her prescription on 1/61/098/15/17 lic [id number]  dlo

## 2015-12-13 DIAGNOSIS — J029 Acute pharyngitis, unspecified: Secondary | ICD-10-CM | POA: Diagnosis not present

## 2015-12-13 DIAGNOSIS — J3089 Other allergic rhinitis: Secondary | ICD-10-CM | POA: Diagnosis not present

## 2015-12-15 DIAGNOSIS — F4323 Adjustment disorder with mixed anxiety and depressed mood: Secondary | ICD-10-CM | POA: Diagnosis not present

## 2015-12-29 DIAGNOSIS — F4323 Adjustment disorder with mixed anxiety and depressed mood: Secondary | ICD-10-CM | POA: Diagnosis not present

## 2016-01-04 ENCOUNTER — Other Ambulatory Visit (HOSPITAL_COMMUNITY): Payer: Self-pay

## 2016-01-04 DIAGNOSIS — F988 Other specified behavioral and emotional disorders with onset usually occurring in childhood and adolescence: Secondary | ICD-10-CM

## 2016-01-05 ENCOUNTER — Other Ambulatory Visit (HOSPITAL_COMMUNITY): Payer: Self-pay

## 2016-01-05 ENCOUNTER — Telehealth (HOSPITAL_COMMUNITY): Payer: Self-pay

## 2016-01-05 DIAGNOSIS — F988 Other specified behavioral and emotional disorders with onset usually occurring in childhood and adolescence: Secondary | ICD-10-CM

## 2016-01-05 MED ORDER — LISDEXAMFETAMINE DIMESYLATE 30 MG PO CAPS: 30.0000 mg | ORAL_CAPSULE | Freq: Every day | ORAL | 0 refills | Status: DC

## 2016-01-05 NOTE — Telephone Encounter (Signed)
4:51pm 01/05/16 ZO#[id number] Ann Santos pick-up rx script.Marland Kitchen.[name]/sh

## 2016-01-05 NOTE — Progress Notes (Signed)
Patient called for a refill on Vyvanse, refill is appropriate and patient has a follow up on  Dr. Ladona Ridgelaylor agreed to sign the rx and patient was called to come and pick up.

## 2016-01-19 DIAGNOSIS — F4323 Adjustment disorder with mixed anxiety and depressed mood: Secondary | ICD-10-CM | POA: Diagnosis not present

## 2016-01-27 ENCOUNTER — Ambulatory Visit: Payer: BLUE CROSS/BLUE SHIELD

## 2016-01-27 ENCOUNTER — Ambulatory Visit (HOSPITAL_COMMUNITY): Payer: Self-pay | Admitting: Psychiatry

## 2016-02-04 ENCOUNTER — Other Ambulatory Visit (HOSPITAL_COMMUNITY): Payer: Self-pay | Admitting: Psychiatry

## 2016-02-04 ENCOUNTER — Telehealth (HOSPITAL_COMMUNITY): Payer: Self-pay

## 2016-02-04 DIAGNOSIS — F900 Attention-deficit hyperactivity disorder, predominantly inattentive type: Secondary | ICD-10-CM

## 2016-02-04 MED ORDER — LISDEXAMFETAMINE DIMESYLATE 30 MG PO CAPS: 30.0000 mg | ORAL_CAPSULE | Freq: Every day | ORAL | 0 refills | Status: DC

## 2016-02-04 NOTE — Telephone Encounter (Signed)
Patient is calling for a refill on her Vyvanse, she has a follow up with you next week, but she is currently out of medication. Please review and advise, thank you

## 2016-02-08 ENCOUNTER — Telehealth (HOSPITAL_COMMUNITY): Payer: Self-pay

## 2016-02-08 NOTE — Telephone Encounter (Signed)
02/08/16 4:39pm Patient came and pickup rx script [id number].Marland Kitchen.[name]/sh

## 2016-02-09 DIAGNOSIS — F4323 Adjustment disorder with mixed anxiety and depressed mood: Secondary | ICD-10-CM | POA: Diagnosis not present

## 2016-02-11 ENCOUNTER — Ambulatory Visit (INDEPENDENT_AMBULATORY_CARE_PROVIDER_SITE_OTHER): Payer: BLUE CROSS/BLUE SHIELD | Admitting: Psychiatry

## 2016-02-11 ENCOUNTER — Encounter (HOSPITAL_COMMUNITY): Payer: Self-pay | Admitting: Psychiatry

## 2016-02-11 VITALS — BP 126/74 | HR 82 | Ht 62.0 in | Wt 229.6 lb

## 2016-02-11 DIAGNOSIS — F419 Anxiety disorder, unspecified: Secondary | ICD-10-CM

## 2016-02-11 DIAGNOSIS — F900 Attention-deficit hyperactivity disorder, predominantly inattentive type: Secondary | ICD-10-CM

## 2016-02-11 DIAGNOSIS — Z79899 Other long term (current) drug therapy: Secondary | ICD-10-CM

## 2016-02-11 DIAGNOSIS — F330 Major depressive disorder, recurrent, mild: Secondary | ICD-10-CM

## 2016-02-11 MED ORDER — LISDEXAMFETAMINE DIMESYLATE 30 MG PO CAPS: 30.0000 mg | ORAL_CAPSULE | Freq: Every day | ORAL | 0 refills | Status: DC

## 2016-02-11 MED ORDER — SERTRALINE HCL 100 MG PO TABS: 100.0000 mg | ORAL_TABLET | Freq: Every day | ORAL | 0 refills | Status: DC

## 2016-02-11 MED ORDER — TRAZODONE HCL 100 MG PO TABS: ORAL_TABLET | ORAL | 0 refills | Status: DC

## 2016-02-11 NOTE — Progress Notes (Signed)
[facility] [id number] Progress Note  Ann PatchesHetty R Brower [id number] 35 y.o.  02/11/2016 4:37 PM  Chief Complaint:  I'm taking care of my mother and on.  Sometime I gets very stressful.     History of Present Illness:  Megan came for her follow-up appointment.  She is taking Vyvanse for her ADD symptoms.  She is also taking Zoloft 100 mg daily and trazodone 50 mg half to one tablet at bedtime.  She continues to have chronic stressors .  She is not happy with her husband who is not very supportive .  She has been thinking a lot to leave him but she has not make any decision yet.  Her husband is working and his job is in [place] and he travels back and forth.  She is concerned because she believe her husband is using pain medication and addicted to narcotics.  Recently patient is helping her mother and her aunt who had surgery.  She is taking care of them and feeling easily burnout.  She is taking Vyvanse which is helping her attention focus and multitasking.  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  She admitted some time work is very challenging but she is glad that she is able to keep the job.  Patient denies any paranoia, hallucination, suicidal thoughts or homicidal thought.  She has 2 children who are 35-year-old and 312 and half-year-old.  Her appetite is okay.  Her energy level is fair.  Patient denies any paranoia or any hallucination.  She wants to continue Vyvanse and her current psychiatric medication.  She has not seen primary care physician in a while and she has no blood work in recent months.  Her vital signs are stable.  Patient denies drinking alcohol or using any illegal substances.  Suicidal Ideation: No Plan Formed: No Patient has means to carry out plan: No  Homicidal Ideation: No Plan Formed: No Patient has means to carry out plan: No  Past Psychiatric History/hospitalization; Patient started taking antidepressant 8 years ago when she  was in [place].  At that time her husband was going through drug addiction and she was very depressed.  She had tried Celexa, Wellbutrin, Prozac and recently Strattera.  She's been taking Zoloft and trazodone for quite a few years but is working very well.  She had [place] [facility] [place] [facility] but most of the time her medicines were given by her primary care physician.  Patient denies any history of psychiatric inpatient treatment.  Patient denies any history of mania, psychosis, hallucination or any suicidal attempt. Anxiety: Yes Bipolar Disorder: No Depression: Yes Mania: No Psychosis: No Schizophrenia: No Personality Disorder: No Hospitalization for psychiatric illness: No History of Electroconvulsive Shock Therapy: No Prior Suicide Attempts: No  Family History; Patient reported family member has schizophrenia and bipolar disorder.  Medical history. Patient has history of diverticulitis, B12 deficiency, hypertension, GERD, obesity and history of gastric bypass.  She had lost 70 pounds since she had gastric bypass surgery in 2016.  Patient denies any history of traumatic brain injury.    Psychosocial history . Patient born in [place] and raised in [place]  Her parents divorced when she was young.  Her mom lives with her sister and patient has very close contact with her mom and her aunt.  Patient also see her father on a regular basis.  She has 2 children from her current husband.  Her husband  used to have addiction with heroine and pain medication but claims to be sober in recent years.  Review of Systems: Psychiatric: Agitation: No Hallucination: No Depressed Mood: No Insomnia: No Hypersomnia: No Altered Concentration: No Feels Worthless: No Grandiose Ideas: No Belief In Special Powers: No New/Increased Substance Abuse: No Compulsions: No  Neurologic: Headache: No Seizure: No Paresthesias: No   Outpatient Encounter  Prescriptions as of 02/11/2016  Medication Sig  . acetaminophen (TYLENOL) 500 MG tablet Take 500 mg by mouth every 6 (six) hours as needed for moderate pain. Reported on 07/20/2015  . calcium acetate (PHOSLO) 667 MG capsule Take by mouth 3 (three) times daily with meals.  . cetirizine (ZYRTEC) 10 MG tablet Take 10 mg by mouth daily as needed for allergies. Reported on 07/20/2015  . CRYSELLE-28 0.3-30 MG-MCG tablet Take 1 tablet by mouth daily. Reported on 07/20/2015  . fluticasone (FLONASE) 50 MCG/ACT nasal spray Place 2 sprays into both nostrils daily as needed for allergies or rhinitis.  Marland Kitchen lisdexamfetamine (VYVANSE) 30 MG capsule Take 1 capsule (30 mg total) by mouth daily.  . Multiple Vitamins-Minerals (MULTIVITAMIN WITH MINERALS) tablet Take 1 tablet by mouth daily.  . Probiotic Product (PROBIOTIC COLON SUPPORT PO) Take 1 tablet by mouth daily.  . sertraline (ZOLOFT) 100 MG tablet Take 1 tablet (100 mg total) by mouth at bedtime.  . traZODone (DESYREL) 100 MG tablet Take 1/2 to 1 tab at bed time  . vitamin B-12 (CYANOCOBALAMIN) 100 MCG tablet Take 100 mcg by mouth daily. Reported on 11/07/2015  . white petrolatum (VASELINE) GEL Apply 1 application topically as needed for lip care or dry skin.  Marland Kitchen zinc oxide (CVS ZINC OXIDE) 20 % ointment Apply 1 application topically as needed for irritation.  . [DISCONTINUED] cetirizine (ZYRTEC ALLERGY) 10 MG tablet Take 1 tablet (10 mg total) by mouth daily.  . [DISCONTINUED] lisdexamfetamine (VYVANSE) 30 MG capsule Take 1 capsule (30 mg total) by mouth daily.  . [DISCONTINUED] lisdexamfetamine (VYVANSE) 30 MG capsule Take 1 capsule (30 mg total) by mouth daily.  . [DISCONTINUED] sertraline (ZOLOFT) 100 MG tablet Take 1 tablet (100 mg total) by mouth at bedtime.  . [DISCONTINUED] traZODone (DESYREL) 100 MG tablet Take 1/2 to 1 tab at bed time   No facility-administered encounter medications on file as of 02/11/2016.     No results found for this or any  previous visit (from the past 2160 hour(s)).    Constitutional:  BP 126/74   Pulse 82   Ht 5\' 2"  (1.575 m)   Wt 229 lb 9.6 oz (104.1 kg)   BMI 41.99 kg/m    Musculoskeletal: Strength & Muscle Tone: within normal limits Gait & Station: normal Patient leans: N/A  Psychiatric Specialty Exam: General Appearance: Casual  Eye Contact::  Good  Speech:  Normal Rate  Volume:  Normal  Mood:  Anxious  Affect:  Congruent  Thought Process:  Goal Directed  Orientation:  Full (Time, Place, and Person)  Thought Content:  Rumination  Suicidal Thoughts:  No  Homicidal Thoughts:  No  Memory:  Immediate;   Fair Recent;   Fair Remote;   Fair  Judgement:  Intact  Insight:  Fair  Psychomotor Activity:  Increased  Concentration:  Fair  Recall:  [employer] of Knowledge:  Fair  Language:  Fair  Akathisia:  No  Handed:  Right  AIMS (if indicated):     Assets:  Communication Skills Desire for Improvement Housing Physical Health  ADL's:  Intact  Cognition:  WNL  Sleep:        Established Problem, Stable/Improving (1), Review of Psycho-Social Stressors (1), Review or order clinical lab tests (1), Review of Last Therapy Session (1), Review of Medication Regimen & Side Effects (2) and Review of New Medication or Change in Dosage (2)   Assessment: Axis I: Major depressive disorder, mild recurrent.  Attention deficit disorder by history   Axis II: Deferred  Axis III:  Past Medical History:  Diagnosis Date  . Abdominal distention   . Abdominal pain   . Anxiety   . Blood in stool   . Constipation   . Diverticulitis    Per pt. States she never had CT scan to confirm this  . Diverticulosis    PT H[facility]IZED AT [facility] October 03, 2011  . Gestational hypertension 07/05/2013  . Gestational hypertension 07/05/2013  . History of chicken pox    childhood  . History of hiatal hernia   . Hypertension    During pregnancy only  . Maternal obesity, antepartum 07/05/2013  . Maternal obesity,  antepartum 07/05/2013  . Numbness    first two fingers right hand   . Rectal bleeding   . Shortness of breath dyspnea    with exercise  . Umbilical hernia    planning surgical repair     Plan:  I discussed psychosocial stressors and recommended counseling and therapy .  She is receiving therapy from Kyra Searles but sometimes she feels it is not working .  I offer intensive outpatient program but she needed more time to think about it.  She will call us if she needed more help.  I will continue Zoloft and milligrams daily, trazodone 100 mg half to one tablet as needed for insomnia and Vyvanse 30 mg daily.  Patient has no tremors, shakes, EPS.  Discussed stimulant abuse, withdrawal in length.  I will also order a CBC, CMP, hemoglobin A1c and TSH.Marland Kitchen Discuss safety plan that anytime having active suicidal thoughts or homicidal thoughts and she need to call 911 or go to the local emergency room.  Follow-up in 3 months.  Flecia Shutter T., MD 02/11/2016       Patient ID: Ann Santos, female   DOB: 11/04/1980, 35 y.o.   MRN: [id number]

## 2016-02-16 DIAGNOSIS — F4323 Adjustment disorder with mixed anxiety and depressed mood: Secondary | ICD-10-CM | POA: Diagnosis not present

## 2016-03-01 DIAGNOSIS — F4323 Adjustment disorder with mixed anxiety and depressed mood: Secondary | ICD-10-CM | POA: Diagnosis not present

## 2016-03-07 ENCOUNTER — Telehealth (HOSPITAL_COMMUNITY): Payer: Self-pay

## 2016-03-07 DIAGNOSIS — F4323 Adjustment disorder with mixed anxiety and depressed mood: Secondary | ICD-10-CM | POA: Diagnosis not present

## 2016-03-07 NOTE — Telephone Encounter (Signed)
Patient is calling for a replacement of 2 of her Vyvanse prescriptions. She was given 3 when she was here 10/19, but claims she accidentally shredded them with some other paperwork. She would like to know if those can be replaced. Please review and advise, thank you

## 2016-03-08 ENCOUNTER — Other Ambulatory Visit (HOSPITAL_COMMUNITY): Payer: Self-pay | Admitting: Psychiatry

## 2016-03-08 ENCOUNTER — Telehealth (HOSPITAL_COMMUNITY): Payer: Self-pay | Admitting: Psychiatry

## 2016-03-08 DIAGNOSIS — F900 Attention-deficit hyperactivity disorder, predominantly inattentive type: Secondary | ICD-10-CM

## 2016-03-08 MED ORDER — LISDEXAMFETAMINE DIMESYLATE 30 MG PO CAPS: 30.0000 mg | ORAL_CAPSULE | Freq: Every day | ORAL | 0 refills | Status: DC

## 2016-03-08 NOTE — Telephone Encounter (Signed)
Prescription printed by Dr. Lolly MustacheArfeen, I called patient to let her know it is ready for pick up

## 2016-03-08 NOTE — Telephone Encounter (Signed)
Patient picked up prescription on 16/01/9610/14/17 lic [id number] dlo

## 2016-03-15 DIAGNOSIS — F4323 Adjustment disorder with mixed anxiety and depressed mood: Secondary | ICD-10-CM | POA: Diagnosis not present

## 2016-03-29 DIAGNOSIS — F4323 Adjustment disorder with mixed anxiety and depressed mood: Secondary | ICD-10-CM | POA: Diagnosis not present

## 2016-04-04 ENCOUNTER — Telehealth (HOSPITAL_COMMUNITY): Payer: Self-pay

## 2016-04-04 NOTE — Telephone Encounter (Signed)
Patient called and is requesting a refill on Vyvanse, she has a follow up next month - please review and advise, thank you

## 2016-04-05 ENCOUNTER — Other Ambulatory Visit (HOSPITAL_COMMUNITY): Payer: Self-pay | Admitting: Psychiatry

## 2016-04-05 DIAGNOSIS — F900 Attention-deficit hyperactivity disorder, predominantly inattentive type: Secondary | ICD-10-CM

## 2016-04-05 MED ORDER — LISDEXAMFETAMINE DIMESYLATE 30 MG PO CAPS: 30.0000 mg | ORAL_CAPSULE | Freq: Every day | ORAL | 0 refills | Status: DC

## 2016-04-05 NOTE — Telephone Encounter (Signed)
Printed and signed by Dr. Lolly MustacheArfeen, I called patient to let her know it was ready for pick up

## 2016-04-06 ENCOUNTER — Telehealth (HOSPITAL_COMMUNITY): Payer: Self-pay | Admitting: Psychiatry

## 2016-04-06 NOTE — Telephone Encounter (Signed)
Alois picked up prescription on 16/01/9611/13/17 lic [id number] dlo

## 2016-04-14 DIAGNOSIS — F4323 Adjustment disorder with mixed anxiety and depressed mood: Secondary | ICD-10-CM | POA: Diagnosis not present

## 2016-04-20 DIAGNOSIS — F4323 Adjustment disorder with mixed anxiety and depressed mood: Secondary | ICD-10-CM | POA: Diagnosis not present

## 2016-04-23 ENCOUNTER — Ambulatory Visit (INDEPENDENT_AMBULATORY_CARE_PROVIDER_SITE_OTHER): Payer: BLUE CROSS/BLUE SHIELD | Admitting: Physician Assistant

## 2016-04-23 VITALS — BP 130/80 | HR 81 | Temp 98.2°F | Resp 17 | Ht 62.5 in | Wt 229.0 lb

## 2016-04-23 DIAGNOSIS — J019 Acute sinusitis, unspecified: Secondary | ICD-10-CM | POA: Diagnosis not present

## 2016-04-23 DIAGNOSIS — H9201 Otalgia, right ear: Secondary | ICD-10-CM

## 2016-04-23 MED ORDER — AZELASTINE HCL 0.15 % NA SOLN: 2.0000 | Freq: Two times a day (BID) | NASAL | 0 refills | Status: DC

## 2016-04-23 MED ORDER — AMOXICILLIN-POT CLAVULANATE 875-125 MG PO TABS: 1.0000 | ORAL_TABLET | Freq: Two times a day (BID) | ORAL | 0 refills | Status: AC

## 2016-04-23 MED ORDER — GUAIFENESIN ER 1200 MG PO TB12: 1.0000 | ORAL_TABLET | Freq: Two times a day (BID) | ORAL | 1 refills | Status: DC | PRN

## 2016-04-23 MED ORDER — FLUCONAZOLE 150 MG PO TABS: 150.0000 mg | ORAL_TABLET | Freq: Once | ORAL | 0 refills | Status: DC

## 2016-04-23 NOTE — Progress Notes (Signed)
Patient ID: Ann PatchesHetty R Meanor, female    DOB: 07/26/1980, 35 y.o.   MRN: [id number]  PCP: Elvina SidleKurt Lauenstein, MD  Chief Complaint  Patient presents with  . Sinusitis  . Cough  . Ear Pain  . Sore Throat  . Fatigue    Subjective:   Presents for evaluation of 10 days of RIGHT sided sinus pressure and pain, cough, sore throat. RIGHT ear and neck pain. Cough is non-productive. Feels really tired. Headache.  No nausea/vomiting/diarrhea. No fever. No urinary changes. No myalgias.  Also reports a "THING in my throat. Like a tooth stuck in my throat." Doesn't hurt. Always there, on the RIGHT.  Review of Systems As above.    Patient Active Problem List   Diagnosis Date Noted  . Morbid obesity (HCC) 11/17/2014  . Atypical chest pain 07/17/2013  . Diverticular disease 10/23/2012  . Umbilical hernia 10/26/2011  . Anxiety 07/26/2011     Prior to Admission medications   Medication Sig Start Date End Date Taking? Authorizing Provider  acetaminophen (TYLENOL) 500 MG tablet Take 500 mg by mouth every 6 (six) hours as needed for moderate pain. Reported on 07/20/2015   Yes Historical Provider, MD  calcium acetate (PHOSLO) 667 MG capsule Take by mouth 3 (three) times daily with meals.   Yes Historical Provider, MD  cetirizine (ZYRTEC) 10 MG tablet Take 10 mg by mouth daily as needed for allergies. Reported on 07/20/2015   Yes Historical Provider, MD  CRYSELLE-28 0.3-30 MG-MCG tablet Take 1 tablet by mouth daily. Reported on 07/20/2015 10/30/14  Yes Historical Provider, MD  fluticasone (FLONASE) 50 MCG/ACT nasal spray Place 2 sprays into both nostrils daily as needed for allergies or rhinitis. 04/06/14  Yes Ofilia NeasMichael L Clark, PA-C  lisdexamfetamine (VYVANSE) 30 MG capsule Take 1 capsule (30 mg total) by mouth daily. 04/05/16  Yes Cleotis NipperSyed T Arfeen, MD  Multiple Vitamins-Minerals (MULTIVITAMIN WITH MINERALS) tablet Take 1 tablet by mouth daily.   Yes Historical Provider, MD  Probiotic Product (PROBIOTIC  COLON SUPPORT PO) Take 1 tablet by mouth daily.   Yes Historical Provider, MD  sertraline (ZOLOFT) 100 MG tablet Take 1 tablet (100 mg total) by mouth at bedtime. 02/11/16  Yes Cleotis NipperSyed T Arfeen, MD  traZODone (DESYREL) 100 MG tablet Take 1/2 to 1 tab at bed time 02/11/16  Yes Cleotis NipperSyed T Arfeen, MD  vitamin B-12 (CYANOCOBALAMIN) 100 MCG tablet Take 100 mcg by mouth daily. Reported on 11/07/2015   Yes Historical Provider, MD  white petrolatum (VASELINE) GEL Apply 1 application topically as needed for lip care or dry skin. 11/07/15  Yes Ofilia NeasMichael L Clark, PA-C  zinc oxide (CVS ZINC OXIDE) 20 % ointment Apply 1 application topically as needed for irritation. 11/07/15  Yes Ofilia NeasMichael L Clark, PA-C     No Known Allergies     Objective:  Physical Exam  Constitutional: She is oriented to person, place, and time. She appears well-developed and well-nourished. No distress.  BP 130/80 (BP Location: Right Arm, Patient Position: Sitting, Cuff Size: Normal)   Pulse 81   Temp 98.2 F (36.8 C) (Oral)   Resp 17   Ht 5' 2.5" (1.588 m)   Wt 229 lb (103.9 kg)   SpO2 97%   BMI 41.22 kg/m    HENT:  Head: Normocephalic and atraumatic.  Right Ear: Hearing, external ear and ear canal normal. Tympanic membrane is injected. Tympanic membrane is not retracted and not bulging.  Left Ear: Hearing, tympanic membrane, external ear and ear canal  normal.  Nose: Mucosal edema and rhinorrhea present.  No foreign bodies. Right sinus exhibits no maxillary sinus tenderness and no frontal sinus tenderness. Left sinus exhibits no maxillary sinus tenderness and no frontal sinus tenderness.  Mouth/Throat: Uvula is midline, oropharynx is clear and moist and mucous membranes are normal. No uvula swelling. No oropharyngeal exudate.  Eyes: Conjunctivae and EOM are normal. Pupils are equal, round, and reactive to light. Right eye exhibits no discharge. Left eye exhibits no discharge. No scleral icterus.  Neck: Trachea normal, normal range of  motion and full passive range of motion without pain. Neck supple. No thyroid mass and no thyromegaly present.  Cardiovascular: Normal rate, regular rhythm and normal heart sounds.   Pulmonary/Chest: Effort normal and breath sounds normal.  Lymphadenopathy:       Head (right side): No submandibular, no tonsillar, no preauricular, no posterior auricular and no occipital adenopathy present.       Head (left side): No submandibular, no tonsillar, no preauricular and no occipital adenopathy present.    She has no cervical adenopathy.       Right: No supraclavicular adenopathy present.       Left: No supraclavicular adenopathy present.  Neurological: She is alert and oriented to person, place, and time. She has normal strength. No cranial nerve deficit or sensory deficit.  Skin: Skin is warm, dry and intact. No rash noted.  Psychiatric: She has a normal mood and affect. Her speech is normal and behavior is normal.           Assessment & Plan:   1. Acute sinusitis, recurrence not specified, unspecified location Age appropriate anticipatory guidance provided. - Azelastine HCl 0.15 % SOLN; Place 2 sprays into both nostrils 2 (two) times daily.  Dispense: 30 mL; Refill: 0 - Guaifenesin (MUCINEX MAXIMUM STRENGTH) 1200 MG TB12; Take 1 tablet (1,200 mg total) by mouth every 12 (twelve) hours as needed.  Dispense: 14 tablet; Refill: 1 - amoxicillin-clavulanate (AUGMENTIN) 875-125 MG tablet; Take 1 tablet by mouth 2 (two) times daily.  Dispense: 20 tablet; Refill: 0 - fluconazole (DIFLUCAN) 150 MG tablet; Take 1 tablet (150 mg total) by mouth once. Repeat if needed  Dispense: 2 tablet; Refill: 0  2. Acute pain of right ear Suspect evolving AOM. Treatment as above.   Fernande Brashelle S. Gittel Mccamish, PA-C Physician Assistant-Certified [facility]

## 2016-04-23 NOTE — Patient Instructions (Addendum)
Get plenty of rest and drink at least 64 ounces of water daily.    IF you received an x-ray today, you will receive an invoice from [facility]. Please contact [facility] at [phone] with questions or concerns regarding your invoice.   IF you received labwork today, you will receive an invoice from [facility]. Please contact [facility] at [phone] with questions or concerns regarding your invoice.   Our billing staff will not be able to assist you with questions regarding bills from these companies.  You will be contacted with the lab results as soon as they are available. The fastest way to get your results is to activate your My Chart account. Instructions are located on the last page of this paperwork. If you have not heard from us regarding the results in 2 weeks, please contact this office.      

## 2016-04-26 DIAGNOSIS — F4323 Adjustment disorder with mixed anxiety and depressed mood: Secondary | ICD-10-CM | POA: Diagnosis not present

## 2016-04-29 ENCOUNTER — Ambulatory Visit (INDEPENDENT_AMBULATORY_CARE_PROVIDER_SITE_OTHER): Payer: BLUE CROSS/BLUE SHIELD | Admitting: Physician Assistant

## 2016-04-29 VITALS — BP 122/80 | HR 80 | Temp 98.8°F | Resp 18 | Ht 62.0 in | Wt 232.2 lb

## 2016-04-29 DIAGNOSIS — H7291 Unspecified perforation of tympanic membrane, right ear: Secondary | ICD-10-CM | POA: Diagnosis not present

## 2016-04-29 DIAGNOSIS — H6591 Unspecified nonsuppurative otitis media, right ear: Secondary | ICD-10-CM

## 2016-04-29 MED ORDER — CIPROFLOXACIN-HYDROCORTISONE 0.2-1 % OT SUSP: 3.0000 [drp] | Freq: Two times a day (BID) | OTIC | 0 refills | Status: DC

## 2016-04-29 NOTE — Progress Notes (Signed)
  04/29/2016 5:18 PM   DOB: 11/29/1980 / MRN: [id number]  SUBJECTIVE:  Ann Santos is a 36 y.o. female presenting for "a right ear infection."  Complains of inability to hear and has been losing her balance. She complains of runny nose.  She has been taking amoxicillin for 6 days now from her last visit.   She has No Known Allergies.   Review of Systems  HENT: Positive for ear discharge and hearing loss. Negative for congestion and ear pain.     The problem list and medications were reviewed and updated by myself where necessary and exist elsewhere in the encounter.   OBJECTIVE:  BP 122/80 (BP Location: Right Arm, Patient Position: Sitting, Cuff Size: Large)   Pulse 80   Temp 98.8 F (37.1 C) (Oral)   Resp 18   Ht 5\' 2"  (1.575 m)   Wt 232 lb 3.2 oz (105.3 kg)   SpO2 98%   BMI 42.47 kg/m   Physical Exam  HENT:  Left Ear: Tympanic membrane normal.  Ears:  Cardiovascular: Normal rate and regular rhythm.   Pulmonary/Chest: Effort normal and breath sounds normal.    ASSESSMENT AND PLAN:  Ann Santos was seen today for follow-up.  Diagnoses and all orders for this visit:  Otitis media, serous, tm rupture, right; Uncomplicated.  She is taking augmentin now and has 3 days left.  Advised she keep the canal dry.  She has ear drop in the event she develops ear canal pain.  RTC as needed.  -     ciprofloxacin-hydrocortisone (CIPRO HC) otic suspension; Place 3 drops into the right ear 2 (two) times daily. No need to fill unless you begin to have pain about the affected ear.    The patient is advised to call or return to clinic if she does not see an improvement in symptoms, or to seek the care of the closest emergency department if she worsens with the above plan.   Deliah BostonMichael Jennings Stirling, MHS, PA-C [facility] 04/29/2016 5:18 PM

## 2016-04-29 NOTE — Patient Instructions (Signed)
     IF you received an x-ray today, you will receive an invoice from [facility]. Please contact [facility] at [phone] with questions or concerns regarding your invoice.   IF you received labwork today, you will receive an invoice from [facility]. Please contact [facility] at [phone] with questions or concerns regarding your invoice.   Our billing staff will not be able to assist you with questions regarding bills from these companies.  You will be contacted with the lab results as soon as they are available. The fastest way to get your results is to activate your My Chart account. Instructions are located on the last page of this paperwork. If you have not heard from us regarding the results in 2 weeks, please contact this office.     

## 2016-05-03 DIAGNOSIS — F4323 Adjustment disorder with mixed anxiety and depressed mood: Secondary | ICD-10-CM | POA: Diagnosis not present

## 2016-05-08 ENCOUNTER — Other Ambulatory Visit: Payer: Self-pay | Admitting: Physician Assistant

## 2016-05-08 DIAGNOSIS — J019 Acute sinusitis, unspecified: Secondary | ICD-10-CM

## 2016-05-10 ENCOUNTER — Other Ambulatory Visit (HOSPITAL_COMMUNITY): Payer: Self-pay | Admitting: Psychiatry

## 2016-05-10 DIAGNOSIS — F330 Major depressive disorder, recurrent, mild: Secondary | ICD-10-CM

## 2016-05-11 NOTE — Telephone Encounter (Signed)
04/23/16 last seen and given 2 doses rx

## 2016-05-12 NOTE — Telephone Encounter (Signed)
Meds ordered this encounter  Medications  . fluconazole (DIFLUCAN) 150 MG tablet    Sig: TAKE 1 TABLET (150 MG TOTAL) BY MOUTH ONCE. REPEAT IF NEEDED    Dispense:  2 tablet    Refill:  0

## 2016-05-13 ENCOUNTER — Other Ambulatory Visit (HOSPITAL_COMMUNITY): Payer: Self-pay | Admitting: Psychiatry

## 2016-05-13 ENCOUNTER — Telehealth (HOSPITAL_COMMUNITY): Payer: Self-pay

## 2016-05-13 DIAGNOSIS — F900 Attention-deficit hyperactivity disorder, predominantly inattentive type: Secondary | ICD-10-CM

## 2016-05-13 MED ORDER — LISDEXAMFETAMINE DIMESYLATE 30 MG PO CAPS: 30.0000 mg | ORAL_CAPSULE | Freq: Every day | ORAL | 0 refills | Status: DC

## 2016-05-13 NOTE — Telephone Encounter (Signed)
Patient is calling for a refill on Vyvanse - she has a  Follow up next week. Please review and advise, thank you

## 2016-05-13 NOTE — Telephone Encounter (Signed)
I called the patient and left voicemail letting her know it was ready for pick up

## 2016-05-17 DIAGNOSIS — F4323 Adjustment disorder with mixed anxiety and depressed mood: Secondary | ICD-10-CM | POA: Diagnosis not present

## 2016-05-18 ENCOUNTER — Telehealth (HOSPITAL_COMMUNITY): Payer: Self-pay

## 2016-05-18 ENCOUNTER — Telehealth (HOSPITAL_COMMUNITY): Payer: Self-pay | Admitting: *Deleted

## 2016-05-18 ENCOUNTER — Ambulatory Visit (INDEPENDENT_AMBULATORY_CARE_PROVIDER_SITE_OTHER): Payer: BLUE CROSS/BLUE SHIELD | Admitting: Psychiatry

## 2016-05-18 DIAGNOSIS — Z79899 Other long term (current) drug therapy: Secondary | ICD-10-CM

## 2016-05-18 DIAGNOSIS — Z833 Family history of diabetes mellitus: Secondary | ICD-10-CM

## 2016-05-18 DIAGNOSIS — F330 Major depressive disorder, recurrent, mild: Secondary | ICD-10-CM | POA: Diagnosis not present

## 2016-05-18 DIAGNOSIS — F419 Anxiety disorder, unspecified: Secondary | ICD-10-CM | POA: Diagnosis not present

## 2016-05-18 DIAGNOSIS — Z8249 Family history of ischemic heart disease and other diseases of the circulatory system: Secondary | ICD-10-CM

## 2016-05-18 DIAGNOSIS — Z9889 Other specified postprocedural states: Secondary | ICD-10-CM | POA: Diagnosis not present

## 2016-05-18 DIAGNOSIS — Z801 Family history of malignant neoplasm of trachea, bronchus and lung: Secondary | ICD-10-CM

## 2016-05-18 MED ORDER — SERTRALINE HCL 100 MG PO TABS: 100.0000 mg | ORAL_TABLET | Freq: Every day | ORAL | 0 refills | Status: DC

## 2016-05-18 NOTE — Telephone Encounter (Signed)
Prior authorization for Vyvanse received. Submitted online with Cover My Meds. Awaiting for response to be faxed to office.

## 2016-05-18 NOTE — Telephone Encounter (Signed)
Medication management - Telephone message left for patient her prior authorizaton for Vyvanse had been submitted and we were awaiting a decision. Will call patient back once response received.

## 2016-05-18 NOTE — Progress Notes (Signed)
BH MD/PA/NP OP Progress Note  05/18/2016 2:26 PM Ann Santos  MRN:  [id number]  Chief Complaint:  Subjective:  I'm doing good. I still have issues with my husband but am not having argument with him.  Her mother is doing well.  History of presenting illness; Ann Santos came for her follow-up appointment.  She is taking her medication as prescribed Lexapro 5 and she ran out 2 days ago.  She admitted difficulty getting filled which could be due to prior authorization.  Overall she described her mood is good.  Her mother is doing very well and she has been happy about her health.  She admitted chronic issues with her husband but lately she is not having any arguments with him.  Her attention and focus is good.  She denies any irritability, anger, mania or any psychosis.  Her energy level is good.  She is able to do multitasking.  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  She admitted some time work is very challenging that she is able to keep her job.  She has no tremors shakes or any EPS.  Her energy level is good.  She forgot blood work but promised to have it done very soon.  Patient denies drinking alcohol or using any illegal substances.  She is seeing therapist Jonni Sanger every week.  Visit Diagnosis:    ICD-9-CM ICD-10-CM   1. Encounter for long-term (current) use of medications V58.69 Z79.899 TSH     CBC with Differential/Platelet     COMPLETE METABOLIC PANEL WITH GFR     Hemoglobin A1c  2. Mild episode of recurrent major depressive disorder (HCC) 296.31 F33.0 sertraline (ZOLOFT) 100 MG tablet  3. Anxiety 300.00 F41.9 sertraline (ZOLOFT) 100 MG tablet    Past Psychiatric History: Reviewed.  Past Medical History:  Past Medical History:  Diagnosis Date  . Abdominal distention   . Abdominal pain   . Anxiety   . Blood in stool   . Constipation   . Diverticulitis    Per pt. States she never had CT scan to confirm this  . Diverticulosis     PT HOSPITALIZED AT [facility] October 03, 2011  . Gestational hypertension 07/05/2013  . Gestational hypertension 07/05/2013  . History of chicken pox    childhood  . History of hiatal hernia   . Hypertension    During pregnancy only  . Maternal obesity, antepartum 07/05/2013  . Maternal obesity, antepartum 07/05/2013  . Numbness    first two fingers right hand   . Rectal bleeding   . Shortness of breath dyspnea    with exercise  . Umbilical hernia    planning surgical repair    Past Surgical History:  Procedure Laterality Date  . BREATH TEK H PYLORI N/A 07/22/2014   Procedure: BREATH TEK H PYLORI;  Surgeon: Ovidio Kin, MD;  Location: [facility];  Service: General;  Laterality: N/A;  . CESAREAN SECTION  05/28/09  . CESAREAN SECTION N/A 07/05/2013   Procedure: REPEAT CESAREAN SECTION (With Special Wound Vac);  Surgeon: Robley Fries, MD;  Location: [facility];  Service: Obstetrics;  Laterality: N/A;   EDD: 07/23/13  . COLONOSCOPY  10/07/2011   Procedure: COLONOSCOPY;  Surgeon: Theda Belfast, MD;  Location: [facility];  Service: Endoscopy;  Laterality: N/A;  . ESOPHAGOGASTRODUODENOSCOPY  10/07/2011   Procedure: ESOPHAGOGASTRODUODENOSCOPY (EGD);  Surgeon: Theda Belfast, MD;  Location: [facility];  Service: Endoscopy;  Laterality: N/A;  . LAPAROSCOPIC  GASTRIC SLEEVE RESECTION WITH HIATAL HERNIA REPAIR N/A 11/17/2014   Procedure: LAPAROSCOPIC GASTRIC SLEEVE RESECTION ;  Surgeon: Ovidio Kinavid Newman, MD;  Location: [facility];  Service: General;  Laterality: N/A;  . SCAR REVISION  09/17/09   c-section scar revision  . UMBILICAL HERNIA REPAIR  11/01/2011   Procedure: HERNIA REPAIR UMBILICAL ADULT;  Surgeon: Velora Hecklerodd M Gerkin, MD;  Location: [facility];  Service: General;  Laterality: N/A;    Family Psychiatric History: Reviewed.  Family History:  Family History  Problem Relation Age of Onset  . Diabetes Mother   . Hypertension Mother   . Ulcerative colitis Father   . Hypertension Father   . Cancer Maternal  Grandmother     lung    Social History:  Social History   Social History  . Marital status: Married    Spouse name: N/A  . Number of children: N/A  . Years of education: N/A   Social History Main Topics  . Smoking status: Never Smoker  . Smokeless tobacco: Never Used  . Alcohol use No  . Drug use: No  . Sexual activity: Yes   Other Topics Concern  . Not on file   Social History Narrative  . No narrative on file    Allergies: No Known Allergies  Metabolic Disorder Labs: No results found for: HGBA1C, MPG No results found for: PROLACTIN Lab Results  Component Value Date   CHOL 182 10/26/2011   TRIG 140 10/26/2011   HDL 47 10/26/2011   CHOLHDL 3.9 10/26/2011   VLDL 28 10/26/2011   LDLCALC 107 (H) 10/26/2011     Current Medications: Current Outpatient Prescriptions  Medication Sig Dispense Refill  . acetaminophen (TYLENOL) 500 MG tablet Take 500 mg by mouth every 6 (six) hours as needed for moderate pain. Reported on 07/20/2015    . Azelastine HCl 0.15 % SOLN Place 2 sprays into both nostrils 2 (two) times daily. 30 mL 0  . calcium acetate (PHOSLO) 667 MG capsule Take by mouth 3 (three) times daily with meals.    . cetirizine (ZYRTEC) 10 MG tablet Take 10 mg by mouth daily as needed for allergies. Reported on 07/20/2015    . ciprofloxacin-hydrocortisone (CIPRO HC) otic suspension Place 3 drops into the right ear 2 (two) times daily. No need to fill unless you begin to have pain about the affected ear. 10 mL 0  . CRYSELLE-28 0.3-30 MG-MCG tablet Take 1 tablet by mouth daily. Reported on 07/20/2015  12  . fluticasone (FLONASE) 50 MCG/ACT nasal spray Place 2 sprays into both nostrils daily as needed for allergies or rhinitis. 15.8 g 0  . Guaifenesin (MUCINEX MAXIMUM STRENGTH) 1200 MG TB12 Take 1 tablet (1,200 mg total) by mouth every 12 (twelve) hours as needed. 14 tablet 1  . lisdexamfetamine (VYVANSE) 30 MG capsule Take 1 capsule (30 mg total) by mouth daily. 31 capsule 0   . Multiple Vitamins-Minerals (MULTIVITAMIN WITH MINERALS) tablet Take 1 tablet by mouth daily.    . Probiotic Product (PROBIOTIC COLON SUPPORT PO) Take 1 tablet by mouth daily.    . sertraline (ZOLOFT) 100 MG tablet Take 1 tablet (100 mg total) by mouth at bedtime. 90 tablet 0  . traZODone (DESYREL) 100 MG tablet Take 1/2 to 1 tab at bed time 90 tablet 0  . vitamin B-12 (CYANOCOBALAMIN) 100 MCG tablet Take 100 mcg by mouth daily. Reported on 11/07/2015     No current facility-administered medications for this visit.     Neurologic: Headache: No  Seizure: No Paresthesias: No  Musculoskeletal: Strength & Muscle Tone: within normal limits Gait & Station: normal Patient leans: N/A  Psychiatric Specialty Exam: Review of Systems  Constitutional: Negative.   HENT: Negative.   Musculoskeletal: Negative.   Skin: Negative.   Neurological: Negative.     There were no vitals taken for this visit.There is no height or weight on file to calculate BMI.  General Appearance: Casual  Eye Contact:  Good  Speech:  Clear and Coherent  Volume:  Normal  Mood:  Euthymic  Affect:  Appropriate  Thought Process:  Goal Directed  Orientation:  Full (Time, Place, and Person)  Thought Content: WDL and Logical   Suicidal Thoughts:  No  Homicidal Thoughts:  No  Memory:  Immediate;   Good Recent;   Good Remote;   Good  Judgement:  Good  Insight:  Good  Psychomotor Activity:  Normal  Concentration:  Concentration: Good and Attention Span: Good  Recall:  Good  Fund of Knowledge: Good  Language: Good  Akathisia:  No  Handed:  Right  AIMS (if indicated):  0  Assets:  Communication Skills Desire for Improvement Financial Resources/Insurance Housing Resilience  ADL's:  Intact  Cognition: WNL  Sleep:  good   Assessment: Major depressive disorder, recurrent.  Attention deficit disorder.  Plan: Patient is doing better on her current psychiatric medication.  I recommended to ask pharmacy if  medication requires poor authorization.  Continue Vyvanse 30 mg daily, Zoloft and milligrams daily.  She still has leftover trazodone but she is take half to one tablet.  Discussed medication side effects and benefits.  Discussed stimulant abuse tolerance and withdrawal.  She continues to see counselor Jonni Sanger.  Recommended to call us back if she is any question or any concern.  Follow-up in 3 months.       Kaena Santori T., MD 05/18/2016, 2:26 PM

## 2016-05-19 ENCOUNTER — Encounter (HOSPITAL_COMMUNITY): Payer: Self-pay | Admitting: Psychiatry

## 2016-05-22 ENCOUNTER — Ambulatory Visit (HOSPITAL_COMMUNITY)
Admission: EM | Admit: 2016-05-22 | Discharge: 2016-05-22 | Disposition: A | Discharge status: Home / Self Care | Payer: BLUE CROSS/BLUE SHIELD | Attending: Family Medicine | Admitting: Family Medicine

## 2016-05-22 ENCOUNTER — Encounter (HOSPITAL_COMMUNITY): Payer: Self-pay

## 2016-05-22 DIAGNOSIS — S0511XA Contusion of eyeball and orbital tissues, right eye, initial encounter: Secondary | ICD-10-CM | POA: Diagnosis not present

## 2016-05-22 MED ORDER — TOBRAMYCIN 0.3 % OP SOLN: 1.0000 [drp] | Freq: Four times a day (QID) | OPHTHALMIC | 0 refills | Status: DC
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
## 2016-05-22 NOTE — Discharge Instructions (Signed)
Warm cloth to eye before eyedrop, see doctorif further problems
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
## 2016-05-24 DIAGNOSIS — F4323 Adjustment disorder with mixed anxiety and depressed mood: Secondary | ICD-10-CM | POA: Diagnosis not present

## 2016-05-31 DIAGNOSIS — F4323 Adjustment disorder with mixed anxiety and depressed mood: Secondary | ICD-10-CM | POA: Diagnosis not present

## 2016-06-14 ENCOUNTER — Other Ambulatory Visit (HOSPITAL_COMMUNITY): Payer: Self-pay | Admitting: Psychiatry

## 2016-06-14 ENCOUNTER — Telehealth (HOSPITAL_COMMUNITY): Payer: Self-pay

## 2016-06-14 DIAGNOSIS — F900 Attention-deficit hyperactivity disorder, predominantly inattentive type: Secondary | ICD-10-CM

## 2016-06-14 DIAGNOSIS — F4323 Adjustment disorder with mixed anxiety and depressed mood: Secondary | ICD-10-CM | POA: Diagnosis not present

## 2016-06-14 MED ORDER — LISDEXAMFETAMINE DIMESYLATE 30 MG PO CAPS: 30.0000 mg | ORAL_CAPSULE | Freq: Every day | ORAL | 0 refills | Status: DC

## 2016-06-14 NOTE — Telephone Encounter (Signed)
Patient is calling for a refill on her Vyvanse, she has a follow up in April and she is due. Please advise, thank you

## 2016-06-15 NOTE — Telephone Encounter (Signed)
Dr Lolly MustacheArfeen printed prescription and I called patient to let her know it was ready for pick up

## 2016-06-16 ENCOUNTER — Telehealth (HOSPITAL_COMMUNITY): Payer: Self-pay | Admitting: Psychiatry

## 2016-06-16 NOTE — Telephone Encounter (Signed)
Majesti picked up prescription on 1/61/09 lic [id number] dlo

## 2016-06-30 DIAGNOSIS — Z903 Acquired absence of stomach [part of]: Secondary | ICD-10-CM | POA: Diagnosis not present

## 2016-06-30 DIAGNOSIS — Z9889 Other specified postprocedural states: Secondary | ICD-10-CM | POA: Diagnosis not present

## 2016-06-30 DIAGNOSIS — Z6841 Body Mass Index (BMI) 40.0 and over, adult: Secondary | ICD-10-CM | POA: Diagnosis not present

## 2016-07-05 DIAGNOSIS — F4323 Adjustment disorder with mixed anxiety and depressed mood: Secondary | ICD-10-CM | POA: Diagnosis not present

## 2016-07-07 DIAGNOSIS — Z79899 Other long term (current) drug therapy: Secondary | ICD-10-CM | POA: Diagnosis not present

## 2016-07-07 LAB — CBC WITH DIFFERENTIAL/PLATELET
Basophils Absolute: 0 cells/uL (ref 0–200)
Basophils Relative: 0 %
Eosinophils Absolute: 75 cells/uL (ref 15–500)
Eosinophils Relative: 1 %
HCT: 45.1 % — ABNORMAL HIGH (ref 35.0–45.0)
Hemoglobin: 15.3 g/dL (ref 11.7–15.5)
Lymphocytes Relative: 39 %
Lymphs Abs: 2925 cells/uL (ref 850–3900)
MCH: 30.8 pg (ref 27.0–33.0)
MCHC: 33.9 g/dL (ref 32.0–36.0)
MCV: 90.9 fL (ref 80.0–100.0)
MPV: 8.6 fL (ref 7.5–12.5)
Monocytes Absolute: 525 cells/uL (ref 200–950)
Monocytes Relative: 7 %
Neutro Abs: 3975 cells/uL (ref 1500–7800)
Neutrophils Relative %: 53 %
Platelets: 366 10*3/uL (ref 140–400)
RBC: 4.96 MIL/uL (ref 3.80–5.10)
RDW: 12.4 % (ref 11.0–15.0)
WBC: 7.5 10*3/uL (ref 3.8–10.8)

## 2016-07-07 LAB — COMPLETE METABOLIC PANEL WITH GFR
ALT: 20 U/L (ref 6–29)
AST: 21 U/L (ref 10–30)
Albumin: 4.1 g/dL (ref 3.6–5.1)
Alkaline Phosphatase: 54 U/L (ref 33–115)
BUN: 13 mg/dL (ref 7–25)
CO2: 25 mmol/L (ref 20–31)
Calcium: 9.5 mg/dL (ref 8.6–10.2)
Chloride: 103 mmol/L (ref 98–110)
Creat: 0.59 mg/dL (ref 0.50–1.10)
GFR, Est African American: >89 mL/min (ref >=60)
GFR, Est Non African American: >89 mL/min (ref >=60)
Glucose, Bld: 81 mg/dL (ref 65–99)
Potassium: 4 mmol/L (ref 3.5–5.3)
Sodium: 139 mmol/L (ref 135–146)
Total Bilirubin: 0.4 mg/dL (ref 0.2–1.2)
Total Protein: 6.7 g/dL (ref 6.1–8.1)

## 2016-07-07 LAB — TSH: TSH: 1.83 mIU/L

## 2016-07-08 LAB — HEMOGLOBIN A1C
Hgb A1c MFr Bld: 4.6 % (ref <5.7)
Mean Plasma Glucose: 85 mg/dL

## 2016-07-18 DIAGNOSIS — R87610 Atypical squamous cells of undetermined significance on cytologic smear of cervix (ASC-US): Secondary | ICD-10-CM | POA: Diagnosis not present

## 2016-07-18 DIAGNOSIS — Z6841 Body Mass Index (BMI) 40.0 and over, adult: Secondary | ICD-10-CM | POA: Diagnosis not present

## 2016-07-18 DIAGNOSIS — Z1151 Encounter for screening for human papillomavirus (HPV): Secondary | ICD-10-CM | POA: Diagnosis not present

## 2016-07-18 DIAGNOSIS — Z01419 Encounter for gynecological examination (general) (routine) without abnormal findings: Secondary | ICD-10-CM | POA: Diagnosis not present

## 2016-07-19 ENCOUNTER — Telehealth (HOSPITAL_COMMUNITY): Payer: Self-pay

## 2016-07-19 DIAGNOSIS — F4323 Adjustment disorder with mixed anxiety and depressed mood: Secondary | ICD-10-CM | POA: Diagnosis not present

## 2016-07-19 NOTE — Telephone Encounter (Signed)
Patient is calling for a refill on her Vyvanse, she has an appointment next month, but will need refill now. Please review and advise, thank you

## 2016-07-20 ENCOUNTER — Other Ambulatory Visit (HOSPITAL_COMMUNITY): Payer: Self-pay | Admitting: Psychiatry

## 2016-07-20 DIAGNOSIS — F900 Attention-deficit hyperactivity disorder, predominantly inattentive type: Secondary | ICD-10-CM

## 2016-07-20 MED ORDER — LISDEXAMFETAMINE DIMESYLATE 30 MG PO CAPS: 30.0000 mg | ORAL_CAPSULE | Freq: Every day | ORAL | 0 refills | Status: DC

## 2016-07-20 NOTE — Telephone Encounter (Signed)
Dr. Lolly MustacheArfeen printed and signed the prescription, I called patient to let her know that it is ready for pick up

## 2016-07-21 ENCOUNTER — Telehealth (HOSPITAL_COMMUNITY): Payer: Self-pay | Admitting: Psychiatry

## 2016-07-21 NOTE — Telephone Encounter (Signed)
07/21/16 Patient came and pick-up rx script UJ#[id number].Marland Kitchen.[name]/sh

## 2016-08-03 DIAGNOSIS — F4323 Adjustment disorder with mixed anxiety and depressed mood: Secondary | ICD-10-CM | POA: Diagnosis not present

## 2016-08-16 ENCOUNTER — Ambulatory Visit (HOSPITAL_COMMUNITY): Payer: Self-pay | Admitting: Psychiatry

## 2016-08-22 ENCOUNTER — Telehealth (HOSPITAL_COMMUNITY): Payer: Self-pay

## 2016-08-22 NOTE — Telephone Encounter (Signed)
Patient is calling, she is going to run out of her Vyvanse a few days before appointment, she would like a refill. Please review and advise, thank you

## 2016-08-23 ENCOUNTER — Telehealth (HOSPITAL_COMMUNITY): Payer: Self-pay | Admitting: Psychiatry

## 2016-08-23 ENCOUNTER — Other Ambulatory Visit (HOSPITAL_COMMUNITY): Payer: Self-pay | Admitting: Psychiatry

## 2016-08-23 DIAGNOSIS — F900 Attention-deficit hyperactivity disorder, predominantly inattentive type: Secondary | ICD-10-CM

## 2016-08-23 MED ORDER — LISDEXAMFETAMINE DIMESYLATE 30 MG PO CAPS: 30.0000 mg | ORAL_CAPSULE | Freq: Every day | ORAL | 0 refills | Status: DC

## 2016-08-23 NOTE — Telephone Encounter (Signed)
Brenlynn picked up prescription on 04/30/08 lic [id number] dlo

## 2016-08-30 DIAGNOSIS — F4323 Adjustment disorder with mixed anxiety and depressed mood: Secondary | ICD-10-CM | POA: Diagnosis not present

## 2016-09-01 ENCOUNTER — Ambulatory Visit (INDEPENDENT_AMBULATORY_CARE_PROVIDER_SITE_OTHER): Payer: BLUE CROSS/BLUE SHIELD | Admitting: Psychiatry

## 2016-09-01 ENCOUNTER — Encounter (HOSPITAL_COMMUNITY): Payer: Self-pay | Admitting: Psychiatry

## 2016-09-01 DIAGNOSIS — F411 Generalized anxiety disorder: Secondary | ICD-10-CM | POA: Diagnosis not present

## 2016-09-01 DIAGNOSIS — F330 Major depressive disorder, recurrent, mild: Secondary | ICD-10-CM

## 2016-09-01 DIAGNOSIS — F900 Attention-deficit hyperactivity disorder, predominantly inattentive type: Secondary | ICD-10-CM | POA: Diagnosis not present

## 2016-09-01 DIAGNOSIS — Z79899 Other long term (current) drug therapy: Secondary | ICD-10-CM

## 2016-09-01 DIAGNOSIS — F419 Anxiety disorder, unspecified: Secondary | ICD-10-CM

## 2016-09-01 MED ORDER — SERTRALINE HCL 100 MG PO TABS: 100.0000 mg | ORAL_TABLET | Freq: Every day | ORAL | 0 refills | Status: DC

## 2016-09-01 MED ORDER — LISDEXAMFETAMINE DIMESYLATE 30 MG PO CAPS: 30.0000 mg | ORAL_CAPSULE | Freq: Every day | ORAL | 0 refills | Status: DC

## 2016-09-01 MED ORDER — TRAZODONE HCL 100 MG PO TABS: ORAL_TABLET | ORAL | 0 refills | Status: DC

## 2016-09-01 NOTE — Progress Notes (Signed)
BH MD/PA/NP OP Progress Note  09/01/2016 3:00 PM Ann Santos  MRN:  [id number]  Chief Complaint:  Subjective:  I am doing better.  I'm taking my medication.  HPI: Patient came for her follow-up appointment.  She is compliant with medication and denies any side effects.  She is seeing her therapist [name] regularly.  She denies any irritability, anger, mania or any psychosis.  She is handling much better her in-laws.  Her in-laws lived in [place] but usually calm and visited them on a regular basis.  Patient told that husband is very supportive and their relationship is going very well.  Patient sleeping good with the trazodone.  She is taking trazodone as needed.  She denies any crying spells, suicidal thoughts or homicidal thought.  She likes her job.  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  Her energy level is good.  Her vital signs are stable.  She has no tremors shakes or any EPS.  Patient had blood work last month which is essentially normal.  Patient denies taking alcohol or using any illegal substances.  Visit Diagnosis:    ICD-9-CM ICD-10-CM   1. Mild episode of recurrent major depressive disorder (HCC) 296.31 F33.0 traZODone (DESYREL) 100 MG tablet     sertraline (ZOLOFT) 100 MG tablet  2. Attention deficit hyperactivity disorder (ADHD), predominantly inattentive type 314.00 F90.0 lisdexamfetamine (VYVANSE) 30 MG capsule     DISCONTINUED: lisdexamfetamine (VYVANSE) 30 MG capsule  3. Anxiety 300.00 F41.9 sertraline (ZOLOFT) 100 MG tablet    Past Psychiatric History: Reviewed. Patient started taking antidepressant 8 years ago when she was in [place].  At that time her husband was going through drug addiction and she was very depressed.  She had tried Celexa, Wellbutrin, Prozac and recently Strattera.  She's been taking Zoloft and trazodone for quite a few years but is working very well.  She [place] but most of  the time her medicines were given by her primary care physician.  Patient denies any history of psychiatric inpatient treatment.  Patient denies any history of mania, psychosis, hallucination or any suicidal attempt.  Past Medical History:  Past Medical History:  Diagnosis Date  . Abdominal distention   . Abdominal pain   . Anxiety   . Blood in stool   . Constipation   . Diverticulitis    Per pt. States she never had CT scan to confirm this  . Diverticulosis    [facility]  . Gestational hypertension 07/05/2013  . Gestational hypertension 07/05/2013  . History of chicken pox    childhood  . History of hiatal hernia   . Hypertension    During pregnancy only  . Maternal obesity, antepartum 07/05/2013  . Maternal obesity, antepartum 07/05/2013  . Numbness    first two fingers right hand   . Rectal bleeding   . Shortness of breath dyspnea    with exercise  . Umbilical hernia    planning surgical repair    Past Surgical History:  Procedure Laterality Date  . BREATH TEK H PYLORI N/A 07/22/2014   Procedure: BREATH TEK H PYLORI;  Surgeon: Ovidio Kinavid Newman, MD;  [facility];  Service: General;  Laterality: N/A;  . CESAREAN SECTION  05/28/09  . CESAREAN SECTION N/A 07/05/2013   Procedure: REPEAT CESAREAN SECTION (With Special Wound Vac);  Surgeon: Robley FriesVaishali R Mody, MD;  Location: [facility];  Service: Obstetrics;  Laterality: N/A;   EDD: 07/23/13  . COLONOSCOPY  10/07/2011   Procedure: COLONOSCOPY;  Surgeon: Theda Belfast, MD;  [facility];  Service: Endoscopy;  Laterality: N/A;  . ESOPHAGOGASTRODUODENOSCOPY  10/07/2011   Procedure: ESOPHAGOGASTRODUODENOSCOPY (EGD);  Surgeon: Theda Belfast, MD;  [facility];  Service: Endoscopy;  Laterality: N/A;  . LAPAROSCOPIC GASTRIC SLEEVE RESECTION WITH HIATAL HERNIA REPAIR N/A 11/17/2014   Procedure: LAPAROSCOPIC GASTRIC SLEEVE RESECTION ;  Surgeon: Ovidio Kin, MD;  [facility]: [facility];  Service: General;   Laterality: N/A;  . SCAR REVISION  09/17/09   c-section scar revision  . UMBILICAL HERNIA REPAIR  11/01/2011   Procedure: HERNIA REPAIR UMBILICAL ADULT;  Surgeon: Velora Heckler, MD;  [facility];  Service: General;  Laterality: N/A;    Family Psychiatric History: Reviewed.  Family History:  Family History  Problem Relation Age of Onset  . Diabetes Mother   . Hypertension Mother   . Ulcerative colitis Father   . Hypertension Father   . Cancer Maternal Grandmother        lung    Social History:  Social History   Social History  . Marital status: Married    Spouse name: N/A  . Number of children: N/A  . Years of education: N/A   Social History Main Topics  . Smoking status: Never Smoker  . Smokeless tobacco: Never Used  . Alcohol use No  . Drug use: No  . Sexual activity: Yes   Other Topics Concern  . Not on file   Social History Narrative  . No narrative on file    Allergies: No Known Allergies  Metabolic Disorder Labs: Lab Results  Component Value Date   HGBA1C 4.6 07/07/2016   MPG 85 07/07/2016   No results found for: PROLACTIN Lab Results  Component Value Date   CHOL 182 10/26/2011   TRIG 140 10/26/2011   HDL 47 10/26/2011   CHOLHDL 3.9 10/26/2011   VLDL 28 10/26/2011   LDLCALC 107 (H) 10/26/2011     Current Medications: Current Outpatient Prescriptions  Medication Sig Dispense Refill  . acetaminophen (TYLENOL) 500 MG tablet Take 500 mg by mouth every 6 (six) hours as needed for moderate pain. Reported on 07/20/2015    . Azelastine HCl 0.15 % SOLN Place 2 sprays into both nostrils 2 (two) times daily. 30 mL 0  . calcium acetate (PHOSLO) 667 MG capsule Take by mouth 3 (three) times daily with meals.    . cetirizine (ZYRTEC) 10 MG tablet Take 10 mg by mouth daily as needed for allergies. Reported on 07/20/2015    . CRYSELLE-28 0.3-30 MG-MCG tablet Take 1 tablet by mouth daily. Reported on 07/20/2015  12  . fluticasone (FLONASE) 50 MCG/ACT nasal  spray Place 2 sprays into both nostrils daily as needed for allergies or rhinitis. 15.8 g 0  . Guaifenesin (MUCINEX MAXIMUM STRENGTH) 1200 MG TB12 Take 1 tablet (1,200 mg total) by mouth every 12 (twelve) hours as needed. 14 tablet 1  . lisdexamfetamine (VYVANSE) 30 MG capsule Take 1 capsule (30 mg total) by mouth daily. 31 capsule 0  . Multiple Vitamins-Minerals (MULTIVITAMIN WITH MINERALS) tablet Take 1 tablet by mouth daily.    . Probiotic Product (PROBIOTIC COLON SUPPORT PO) Take 1 tablet by mouth daily.    . sertraline (ZOLOFT) 100 MG tablet Take 1 tablet (100 mg total) by mouth at bedtime. 90 tablet 0  . tobramycin (TOBREX) 0.3 % ophthalmic solution Place 1 drop into both  eyes every 6 (six) hours. 5 mL 0  . traZODone (DESYREL) 100 MG tablet Take 1/2 to 1 tab at bed time 90 tablet 0  . vitamin B-12 (CYANOCOBALAMIN) 100 MCG tablet Take 100 mcg by mouth daily. Reported on 11/07/2015     No current facility-administered medications for this visit.     Neurologic: Headache: No Seizure: No Paresthesias: No  Musculoskeletal: Strength & Muscle Tone: within normal limits Gait & Station: normal Patient leans: N/A  Psychiatric Specialty Exam: Review of Systems  Constitutional: Negative.   HENT: Negative.   Musculoskeletal: Negative.   Skin: Negative.   Neurological: Negative.     Blood pressure 130/68, pulse 77, height 5\' 1"  (1.549 m), weight 236 lb (107 kg).Body mass index is 44.59 kg/m.  General Appearance: Fairly Groomed  Eye Contact:  Good  Speech:  Clear and Coherent  Volume:  Normal  Mood:  Euthymic  Affect:  Congruent  Thought Process:  Goal Directed  Orientation:  Full (Time, Place, and Person)  Thought Content: WDL and Logical   Suicidal Thoughts:  No  Homicidal Thoughts:  No  Memory:  Immediate;   Good Recent;   Good Remote;   Good  Judgement:  Good  Insight:  Good  Psychomotor Activity:  Normal  Concentration:  Concentration: Good and Attention Span: Good   Recall:  Good  Fund of Knowledge: Good  Language: Good  Akathisia:  No  Handed:  Right  AIMS (if indicated):  0  Assets:  Communication Skills Desire for Improvement Housing Physical Health Resilience Social Support Talents/Skills Transportation  ADL's:  Intact  Cognition: WNL  Sleep:  good   Assessment: Major depressive disorder, recurrent mild.  Attention deficit disorder inattentive type.  Generalized anxiety disorder.   Plan: Patient is doing better on her current medication.  Continue Vyvanse 30 mg daily, Zoloft 100 mg daily and trazodone 100 mg half to one tablet as needed for insomnia.  Continue counseling with Tom.  Recommended to call us back if she has any question, concern or if she feels worsening of the symptom.  Follow-up in 3 months.  ARFEEN,SYED T., MD 09/01/2016, 3:00 PM

## 2016-09-14 DIAGNOSIS — F4323 Adjustment disorder with mixed anxiety and depressed mood: Secondary | ICD-10-CM | POA: Diagnosis not present

## 2016-10-06 DIAGNOSIS — F4323 Adjustment disorder with mixed anxiety and depressed mood: Secondary | ICD-10-CM | POA: Diagnosis not present

## 2016-10-11 ENCOUNTER — Other Ambulatory Visit (HOSPITAL_COMMUNITY): Payer: Self-pay | Admitting: Psychiatry

## 2016-10-11 DIAGNOSIS — F419 Anxiety disorder, unspecified: Secondary | ICD-10-CM

## 2016-10-11 DIAGNOSIS — F330 Major depressive disorder, recurrent, mild: Secondary | ICD-10-CM

## 2016-10-24 DIAGNOSIS — F4323 Adjustment disorder with mixed anxiety and depressed mood: Secondary | ICD-10-CM | POA: Diagnosis not present

## 2016-11-07 DIAGNOSIS — F4323 Adjustment disorder with mixed anxiety and depressed mood: Secondary | ICD-10-CM | POA: Diagnosis not present

## 2016-11-22 ENCOUNTER — Telehealth (HOSPITAL_COMMUNITY): Payer: Self-pay

## 2016-11-22 DIAGNOSIS — F4323 Adjustment disorder with mixed anxiety and depressed mood: Secondary | ICD-10-CM | POA: Diagnosis not present

## 2016-11-22 NOTE — Telephone Encounter (Signed)
Patient called, she is supposed to come in this week, but will need to reschedule due to having an appointment with her oral surgeon. Patient states she will need a refill on her Vyvanse. Please review and advise, thank you

## 2016-11-23 ENCOUNTER — Other Ambulatory Visit (HOSPITAL_COMMUNITY): Payer: Self-pay | Admitting: Psychiatry

## 2016-11-23 DIAGNOSIS — F900 Attention-deficit hyperactivity disorder, predominantly inattentive type: Secondary | ICD-10-CM

## 2016-11-23 MED ORDER — LISDEXAMFETAMINE DIMESYLATE 30 MG PO CAPS: 30.0000 mg | ORAL_CAPSULE | Freq: Every day | ORAL | 0 refills | Status: DC

## 2016-11-24 ENCOUNTER — Encounter (HOSPITAL_COMMUNITY): Payer: Self-pay | Admitting: Psychiatry

## 2016-11-24 ENCOUNTER — Ambulatory Visit (INDEPENDENT_AMBULATORY_CARE_PROVIDER_SITE_OTHER): Payer: BLUE CROSS/BLUE SHIELD | Admitting: Psychiatry

## 2016-11-24 DIAGNOSIS — F419 Anxiety disorder, unspecified: Secondary | ICD-10-CM

## 2016-11-24 DIAGNOSIS — F330 Major depressive disorder, recurrent, mild: Secondary | ICD-10-CM | POA: Diagnosis not present

## 2016-11-24 DIAGNOSIS — F900 Attention-deficit hyperactivity disorder, predominantly inattentive type: Secondary | ICD-10-CM | POA: Diagnosis not present

## 2016-11-24 MED ORDER — SERTRALINE HCL 100 MG PO TABS: 100.0000 mg | ORAL_TABLET | Freq: Every day | ORAL | 0 refills | Status: DC

## 2016-11-24 MED ORDER — LISDEXAMFETAMINE DIMESYLATE 30 MG PO CAPS: 30.0000 mg | ORAL_CAPSULE | Freq: Every day | ORAL | 0 refills | Status: DC

## 2016-11-24 MED ORDER — TRAZODONE HCL 100 MG PO TABS: ORAL_TABLET | ORAL | 0 refills | Status: DC

## 2016-11-24 NOTE — Progress Notes (Signed)
BH MD/PA/NP OP Progress Note  11/24/2016 11:12 AM Ann Santos  MRN:  [id number]  Chief Complaint:  Chief Complaint    Follow-up     Subjective:  I am doing good.  I'm concerned about 36-year-old daughter who have ADD but continued to have behavior problem.  HPI: Patient came for her follow-up appointment.  She is taking the medication and reported no side effects.  Lately she is concerned about her 36-year-old daughter who had ADD and given the stimulants but does not feel it is working.  She continues to have irritability and behavior problem and recently she shaved her head.  Patient is not happy with the pediatrician and liked to bring her daughter to see child psychiatry in this office.  Overall she described her mood is good.  She continues to see her therapist Ann Santos regularly.  She denies any irritability, anger, mania, psychosis.  She lives with her husband who is very supportive.  Patient denies any crying spells or any feeling of hopelessness or worthlessness.  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  Her energy level is good.  She is taking trazodone half to one tablet as needed.  Patient denies drinking alcohol or using any illegal substances.  Visit Diagnosis:    ICD-10-CM   1. Mild episode of recurrent major depressive disorder (HCC) F33.0 traZODone (DESYREL) 100 MG tablet    sertraline (ZOLOFT) 100 MG tablet  2. Anxiety F41.9 sertraline (ZOLOFT) 100 MG tablet  3. Attention deficit hyperactivity disorder (ADHD), predominantly inattentive type F90.0 lisdexamfetamine (VYVANSE) 30 MG capsule    DISCONTINUED: lisdexamfetamine (VYVANSE) 30 MG capsule    Past Psychiatric History: Reviewed. Patient started taking antidepressant 8 years ago when she was in [place]. At that time her husband was going through drug addiction and she was very depressed. She had tried Celexa, Wellbutrin, Prozac and recently Strattera. She's been taking Zoloft and  trazodone for quite a few years but is working very well. She had [place] but most of the time her medicines were given by her primary care physician. Patient denies any history of psychiatric inpatient treatment. Patient denies any history of mania, psychosis, hallucination or any suicidal attempt.  Past Medical History:  Past Medical History:  Diagnosis Date  . Abdominal distention   . Abdominal pain   . Anxiety   . Blood in stool   . Constipation   . Diverticulitis    Per pt. States she never had CT scan to confirm this  . Diverticulosis    [facility]  . Gestational hypertension 07/05/2013  . Gestational hypertension 07/05/2013  . History of chicken pox    childhood  . History of hiatal hernia   . Hypertension    During pregnancy only  . Maternal obesity, antepartum 07/05/2013  . Maternal obesity, antepartum 07/05/2013  . Numbness    first two fingers right hand   . Rectal bleeding   . Shortness of breath dyspnea    with exercise  . Umbilical hernia    planning surgical repair    Past Surgical History:  Procedure Laterality Date  . BREATH TEK H PYLORI N/A 07/22/2014   Procedure: BREATH TEK H PYLORI;  Surgeon: Ovidio Kinavid Newman, MD;  [facility];  Service: General;  Laterality: N/A;  . CESAREAN SECTION  05/28/09  . CESAREAN SECTION N/A 07/05/2013   Procedure: REPEAT CESAREAN SECTION (With Special Wound Vac);  Surgeon: Robley Fries, MD;  [facility];  Service: Obstetrics;  Laterality: N/A;   EDD: 07/23/13  . COLONOSCOPY  10/07/2011   Procedure: COLONOSCOPY;  Surgeon: Theda Belfast, MD;  [facility];  Service: Endoscopy;  Laterality: N/A;  . ESOPHAGOGASTRODUODENOSCOPY  10/07/2011   Procedure: ESOPHAGOGASTRODUODENOSCOPY (EGD);  Surgeon: Theda Belfast, MD;  [facility];  Service: Endoscopy;  Laterality: N/A;  . LAPAROSCOPIC GASTRIC SLEEVE RESECTION WITH HIATAL HERNIA REPAIR N/A  11/17/2014   Procedure: LAPAROSCOPIC GASTRIC SLEEVE RESECTION ;  Surgeon: Ovidio Kin, MD;  [facility];  Service: General;  Laterality: N/A;  . SCAR REVISION  09/17/09   c-section scar revision  . UMBILICAL HERNIA REPAIR  11/01/2011   Procedure: HERNIA REPAIR UMBILICAL ADULT;  Surgeon: Velora Heckler, MD;  [facility];  Service: General;  Laterality: N/A;    Family Psychiatric History: Reviewed.  Family History:  Family History  Problem Relation Age of Onset  . Diabetes Mother   . Hypertension Mother   . Ulcerative colitis Father   . Hypertension Father   . Cancer Maternal Grandmother        lung    Social History:  Social History   Social History  . Marital status: Married    Spouse name: N/A  . Number of children: N/A  . Years of education: N/A   Social History Main Topics  . Smoking status: Never Smoker  . Smokeless tobacco: Never Used  . Alcohol use No  . Drug use: No  . Sexual activity: Yes    Partners: Male    Birth control/ protection: Pill   Other Topics Concern  . None   Social History Narrative  . None    Allergies: No Known Allergies  Metabolic Disorder Labs: Lab Results  Component Value Date   HGBA1C 4.6 07/07/2016   MPG 85 07/07/2016   No results found for: PROLACTIN Lab Results  Component Value Date   CHOL 182 10/26/2011   TRIG 140 10/26/2011   HDL 47 10/26/2011   CHOLHDL 3.9 10/26/2011   VLDL 28 10/26/2011   LDLCALC 107 (H) 10/26/2011     Current Medications: Current Outpatient Prescriptions  Medication Sig Dispense Refill  . acetaminophen (TYLENOL) 500 MG tablet Take 500 mg by mouth every 6 (six) hours as needed for moderate pain. Reported on 07/20/2015    . Azelastine HCl 0.15 % SOLN Place 2 sprays into both nostrils 2 (two) times daily. 30 mL 0  . calcium acetate (PHOSLO) 667 MG capsule Take by mouth 3 (three) times daily with meals.    . cetirizine (ZYRTEC) 10 MG tablet Take 10 mg by mouth daily as needed for allergies.  Reported on 07/20/2015    . CRYSELLE-28 0.3-30 MG-MCG tablet Take 1 tablet by mouth daily. Reported on 07/20/2015  12  . fluticasone (FLONASE) 50 MCG/ACT nasal spray Place 2 sprays into both nostrils daily as needed for allergies or rhinitis. 15.8 g 0  . lisdexamfetamine (VYVANSE) 30 MG capsule Take 1 capsule (30 mg total) by mouth daily. 31 capsule 0  . Multiple Vitamins-Minerals (MULTIVITAMIN WITH MINERALS) tablet Take 1 tablet by mouth daily.    . Probiotic Product (PROBIOTIC COLON SUPPORT PO) Take 1 tablet by mouth daily.    . sertraline (ZOLOFT) 100 MG tablet Take 1 tablet (100 mg total) by mouth at bedtime. 90 tablet 0  . tobramycin (TOBREX) 0.3 % ophthalmic solution Place 1 drop into both eyes every 6 (six) hours. 5  mL 0  . traZODone (DESYREL) 100 MG tablet Take 1/2 to 1 tab at bed time 90 tablet 0  . vitamin B-12 (CYANOCOBALAMIN) 100 MCG tablet Take 100 mcg by mouth daily. Reported on 11/07/2015     No current facility-administered medications for this visit.     Neurologic: Headache: No Seizure: No Paresthesias: No  Musculoskeletal: Strength & Muscle Tone: within normal limits Gait & Station: normal Patient leans: N/A  Psychiatric Specialty Exam: ROS  Blood pressure 112/78, pulse 90, height 5' 2.5" (1.588 m), weight 234 lb (106.1 kg).Body mass index is 42.12 kg/m.  General Appearance: Casual  Eye Contact:  Good  Speech:  Normal Rate  Volume:  Normal  Mood:  Euthymic  Affect:  Appropriate  Thought Process:  Goal Directed  Orientation:  Full (Time, Place, and Person)  Thought Content: WDL   Suicidal Thoughts:  No  Homicidal Thoughts:  No  Memory:  Immediate;   Good Recent;   Fair Remote;   Fair  Judgement:  Good  Insight:  Good  Psychomotor Activity:  Normal  Concentration:  Concentration: Good and Attention Span: Good  Recall:  Good  Fund of Knowledge: Good  Language: Good  Akathisia:  No  Handed:  Right  AIMS (if indicated):  0  Assets:  Communication  Skills Desire for Improvement Housing  ADL's:  Intact  Cognition: WNL  Sleep:  fair    Assessment: Major depressive disorder, recurrent.  ADD, inattentive type.  Plan: Patient is doing better on her current medication.  Recommended to have her daughter seen chart psychiatry in this office.  Continue Vyvanse 30 mg daily, Zoloft 100 mg daily and trazodone 100 mg half to one tablet as needed for insomnia.  Encouraged to continue counseling with Tom.  Recommended to call us back if she has any question, concern or if she has worsening of the symptom.  Follow-up in 3 months.   Scarlet Abad T., MD 11/24/2016, 11:12 AM

## 2017-01-26 ENCOUNTER — Telehealth (HOSPITAL_COMMUNITY): Payer: Self-pay

## 2017-01-26 ENCOUNTER — Other Ambulatory Visit (HOSPITAL_COMMUNITY): Payer: Self-pay | Admitting: Psychiatry

## 2017-01-26 DIAGNOSIS — F900 Attention-deficit hyperactivity disorder, predominantly inattentive type: Secondary | ICD-10-CM

## 2017-01-26 MED ORDER — LISDEXAMFETAMINE DIMESYLATE 30 MG PO CAPS: 30.0000 mg | ORAL_CAPSULE | Freq: Every day | ORAL | 0 refills | Status: DC

## 2017-01-26 NOTE — Telephone Encounter (Signed)
Patient is calling for a Vyvanse refill - she has a follow up on 11/1. Please review and advise, thank you

## 2017-01-30 ENCOUNTER — Telehealth (HOSPITAL_COMMUNITY): Payer: Self-pay | Admitting: Psychiatry

## 2017-01-30 NOTE — Telephone Encounter (Signed)
Srihitha picked up prescription on 12/31/09 lic [id number]

## 2017-02-20 ENCOUNTER — Other Ambulatory Visit (HOSPITAL_COMMUNITY): Payer: Self-pay | Admitting: Psychiatry

## 2017-02-20 DIAGNOSIS — F419 Anxiety disorder, unspecified: Secondary | ICD-10-CM

## 2017-02-20 DIAGNOSIS — F330 Major depressive disorder, recurrent, mild: Secondary | ICD-10-CM

## 2017-02-23 ENCOUNTER — Ambulatory Visit (HOSPITAL_COMMUNITY): Payer: Self-pay | Admitting: Psychiatry

## 2017-02-23 ENCOUNTER — Telehealth (HOSPITAL_COMMUNITY): Payer: Self-pay

## 2017-02-23 DIAGNOSIS — F419 Anxiety disorder, unspecified: Secondary | ICD-10-CM

## 2017-02-23 DIAGNOSIS — F330 Major depressive disorder, recurrent, mild: Secondary | ICD-10-CM

## 2017-02-23 DIAGNOSIS — F900 Attention-deficit hyperactivity disorder, predominantly inattentive type: Secondary | ICD-10-CM

## 2017-02-23 MED ORDER — LISDEXAMFETAMINE DIMESYLATE 30 MG PO CAPS: 30.0000 mg | ORAL_CAPSULE | Freq: Every day | ORAL | 0 refills | Status: DC

## 2017-02-23 MED ORDER — SERTRALINE HCL 100 MG PO TABS: 100.0000 mg | ORAL_TABLET | Freq: Every day | ORAL | 0 refills | Status: DC

## 2017-02-23 MED ORDER — TRAZODONE HCL 100 MG PO TABS: ORAL_TABLET | ORAL | 0 refills | Status: DC

## 2017-02-23 NOTE — Telephone Encounter (Signed)
Called and left VM informing patient that the doctor sent in a 30 day supply of medication, but she has to pick up the prescription for the Vyvanse.

## 2017-02-23 NOTE — Telephone Encounter (Signed)
We can provide 30-day supply only

## 2017-02-23 NOTE — Telephone Encounter (Signed)
Patient called stating she had to reschedule appt due to her being sick. She rescheduled for 03/21/17 and is asking for a refill on medications. Please review and advise.

## 2017-02-27 ENCOUNTER — Telehealth (HOSPITAL_COMMUNITY): Payer: Self-pay | Admitting: Psychiatry

## 2017-02-27 NOTE — Telephone Encounter (Signed)
Ann Santos picked up prescription on 56/05/1309/5/18 lic [id number]

## 2017-03-07 ENCOUNTER — Encounter: Payer: Self-pay | Admitting: Urgent Care

## 2017-03-07 ENCOUNTER — Ambulatory Visit (INDEPENDENT_AMBULATORY_CARE_PROVIDER_SITE_OTHER): Payer: BLUE CROSS/BLUE SHIELD | Admitting: Urgent Care

## 2017-03-07 VITALS — BP 130/80 | HR 86 | Temp 98.5°F | Resp 17 | Ht 62.5 in | Wt 241.0 lb

## 2017-03-07 DIAGNOSIS — R0982 Postnasal drip: Secondary | ICD-10-CM

## 2017-03-07 DIAGNOSIS — R05 Cough: Secondary | ICD-10-CM | POA: Diagnosis not present

## 2017-03-07 DIAGNOSIS — R490 Dysphonia: Secondary | ICD-10-CM | POA: Diagnosis not present

## 2017-03-07 DIAGNOSIS — R059 Cough, unspecified: Secondary | ICD-10-CM

## 2017-03-07 DIAGNOSIS — J019 Acute sinusitis, unspecified: Secondary | ICD-10-CM

## 2017-03-07 MED ORDER — BENZONATATE 100 MG PO CAPS: 100.0000 mg | ORAL_CAPSULE | Freq: Three times a day (TID) | ORAL | 0 refills | Status: DC | PRN

## 2017-03-07 MED ORDER — AMOXICILLIN 500 MG PO CAPS: 500.0000 mg | ORAL_CAPSULE | Freq: Three times a day (TID) | ORAL | 0 refills | Status: DC

## 2017-03-07 MED ORDER — HYDROCODONE-HOMATROPINE 5-1.5 MG/5ML PO SYRP: 5.0000 mL | ORAL_SOLUTION | Freq: Every evening | ORAL | 0 refills | Status: DC | PRN

## 2017-03-07 NOTE — Patient Instructions (Addendum)
Sinusitis, Adult Sinusitis is soreness and inflammation of your sinuses. Sinuses are hollow spaces in the bones around your face. Your sinuses are located:  Around your eyes.  In the middle of your forehead.  Behind your nose.  In your cheekbones.  Your sinuses and nasal passages are lined with a stringy fluid (mucus). Mucus normally drains out of your sinuses. When your nasal tissues become inflamed or swollen, the mucus can become trapped or blocked so air cannot flow through your sinuses. This allows bacteria, viruses, and funguses to grow, which leads to infection. Sinusitis can develop quickly and last for 7?10 days (acute) or for more than 12 weeks (chronic). Sinusitis often develops after a cold. What are the causes? This condition is caused by anything that creates swelling in the sinuses or stops mucus from draining, including:  Allergies.  Asthma.  Bacterial or viral infection.  Abnormally shaped bones between the nasal passages.  Nasal growths that contain mucus (nasal polyps).  Narrow sinus openings.  Pollutants, such as chemicals or irritants in the air.  A foreign object stuck in the nose.  A fungal infection. This is rare.  What increases the risk? The following factors may make you more likely to develop this condition:  Having allergies or asthma.  Having had a recent cold or respiratory tract infection.  Having structural deformities or blockages in your nose or sinuses.  Having a weak immune system.  Doing a lot of swimming or diving.  Overusing nasal sprays.  Smoking.  What are the signs or symptoms? The main symptoms of this condition are pain and a feeling of pressure around the affected sinuses. Other symptoms include:  Upper toothache.  Earache.  Headache.  Bad breath.  Decreased sense of smell and taste.  A cough that may get worse at night.  Fatigue.  Fever.  Thick drainage from your nose. The drainage is often green and  it may contain pus (purulent).  Stuffy nose or congestion.  Postnasal drip. This is when extra mucus collects in the throat or back of the nose.  Swelling and warmth over the affected sinuses.  Sore throat.  Sensitivity to light.  How is this diagnosed? This condition is diagnosed based on symptoms, a medical history, and a physical exam. To find out if your condition is acute or chronic, your health care provider may:  Look in your nose for signs of nasal polyps.  Tap over the affected sinus to check for signs of infection.  View the inside of your sinuses using an imaging device that has a light attached (endoscope).  If your health care provider suspects that you have chronic sinusitis, you may also:  Be tested for allergies.  Have a sample of mucus taken from your nose (nasal culture) and checked for bacteria.  Have a mucus sample examined to see if your sinusitis is related to an allergy.  If your sinusitis does not respond to treatment and it lasts longer than 8 weeks, you may have an MRI or CT scan to check your sinuses. These scans also help to determine how severe your infection is. In rare cases, a bone biopsy may be done to rule out more serious types of fungal sinus disease. How is this treated? Treatment for sinusitis depends on the cause and whether your condition is chronic or acute. If a virus is causing your sinusitis, your symptoms will go away on their own within 10 days. You may be given medicines to relieve your symptoms,   including:  Topical nasal decongestants. They shrink swollen nasal passages and let mucus drain from your sinuses.  Antihistamines. These drugs block inflammation that is triggered by allergies. This can help to ease swelling in your nose and sinuses.  Topical nasal corticosteroids. These are nasal sprays that ease inflammation and swelling in your nose and sinuses.  Nasal saline washes. These rinses can help to get rid of thick mucus in  your nose.  If your condition is caused by bacteria, you will be given an antibiotic medicine. If your condition is caused by a fungus, you will be given an antifungal medicine. Surgery may be needed to correct underlying conditions, such as narrow nasal passages. Surgery may also be needed to remove polyps. Follow these instructions at home: Medicines  Take, use, or apply over-the-counter and prescription medicines only as told by your health care provider. These may include nasal sprays.  If you were prescribed an antibiotic medicine, take it as told by your health care provider. Do not stop taking the antibiotic even if you start to feel better. Hydrate and Humidify  Drink enough water to keep your urine clear or pale yellow. Staying hydrated will help to thin your mucus.  Use a cool mist humidifier to keep the humidity level in your home above 50%.  Inhale steam for 10-15 minutes, 3-4 times a day or as told by your health care provider. You can do this in the bathroom while a hot shower is running.  Limit your exposure to cool or dry air. Rest  Rest as much as possible.  Sleep with your head raised (elevated).  Make sure to get enough sleep each night. General instructions  Apply a warm, moist washcloth to your face 3-4 times a day or as told by your health care provider. This will help with discomfort.  Wash your hands often with soap and water to reduce your exposure to viruses and other germs. If soap and water are not available, use hand sanitizer.  Do not smoke. Avoid being around people who are smoking (secondhand smoke).  Keep all follow-up visits as told by your health care provider. This is important. Contact a health care provider if:  You have a fever.  Your symptoms get worse.  Your symptoms do not improve within 10 days. Get help right away if:  You have a severe headache.  You have persistent vomiting.  You have pain or swelling around your face or  eyes.  You have vision problems.  You develop confusion.  Your neck is stiff.  You have trouble breathing. This information is not intended to replace advice given to you by your health care provider. Make sure you discuss any questions you have with your health care provider. Document Released: 04/11/2005 Document Revised: 12/06/2015 Document Reviewed: 02/04/2015 Elsevier Interactive Patient Education  2017 [employer]     Cough, Adult A cough helps to clear your throat and lungs. A cough may last only 2-3 weeks (acute), or it may last longer than 8 weeks (chronic). Many different things can cause a cough. A cough may be a sign of an illness or another medical condition. Follow these instructions at home:  Pay attention to any changes in your cough.  Take medicines only as told by your doctor. ? If you were prescribed an antibiotic medicine, take it as told by your doctor. Do not stop taking it even if you start to feel better. ? Talk with your doctor before you try using a  cough medicine.  Drink enough fluid to keep your pee (urine) clear or pale yellow.  If the air is dry, use a cold steam vaporizer or humidifier in your home.  Stay away from things that make you cough at work or at home.  If your cough is worse at night, try using extra pillows to raise your head up higher while you sleep.  Do not smoke, and try not to be around smoke. If you need help quitting, ask your doctor.  Do not have caffeine.  Do not drink alcohol.  Rest as needed. Contact a doctor if:  You have new problems (symptoms).  You cough up yellow fluid (pus).  Your cough does not get better after 2-3 weeks, or your cough gets worse.  Medicine does not help your cough and you are not sleeping well.  You have pain that gets worse or pain that is not helped with medicine.  You have a fever.  You are losing weight and you do not know why.  You have night sweats. Get help right away  if:  You cough up blood.  You have trouble breathing.  Your heartbeat is very fast. This information is not intended to replace advice given to you by your health care provider. Make sure you discuss any questions you have with your health care provider. Document Released: 12/23/2010 Document Revised: 09/17/2015 Document Reviewed: 06/18/2014 Elsevier Interactive Patient Education  2018 [employer]. Sinusitis, Adult Sinusitis is soreness and inflammation of your sinuses. Sinuses are hollow spaces in the bones around your face. They are located:  Around your eyes.  In the middle of your forehead.  Behind your nose.  In your cheekbones.  Your sinuses and nasal passages are lined with a stringy fluid (mucus). Mucus normally drains out of your sinuses. When your nasal tissues get inflamed or swollen, the mucus can get trapped or blocked so air cannot flow through your sinuses. This lets bacteria, viruses, and funguses grow, and that leads to infection. Follow these instructions at home: Medicines  Take, use, or apply over-the-counter and prescription medicines only as told by your doctor. These may include nasal sprays.  If you were prescribed an antibiotic medicine, take it as told by your doctor. Do not stop taking the antibiotic even if you start to feel better. Hydrate and Humidify  Drink enough water to keep your pee (urine) clear or pale yellow.  Use a cool mist humidifier to keep the humidity level in your home above 50%.  Breathe in steam for 10-15 minutes, 3-4 times a day or as told by your doctor. You can do this in the bathroom while a hot shower is running.  Try not to spend time in cool or dry air. Rest  Rest as much as possible.  Sleep with your head raised (elevated).  Make sure to get enough sleep each night. General instructions  Put a warm, moist washcloth on your face 3-4 times a day or as told by your doctor. This will help with discomfort.  Wash your  hands often with soap and water. If there is no soap and water, use hand sanitizer.  Do not smoke. Avoid being around people who are smoking (secondhand smoke).  Keep all follow-up visits as told by your doctor. This is important. Contact a doctor if:  You have a fever.  Your symptoms get worse.  Your symptoms do not get better within 10 days. Get help right away if:  You have a very  bad headache.  You cannot stop throwing up (vomiting).  You have pain or swelling around your face or eyes.  You have trouble seeing.  You feel confused.  Your neck is stiff.  You have trouble breathing. This information is not intended to replace advice given to you by your health care provider. Make sure you discuss any questions you have with your health care provider. Document Released: 09/28/2007 Document Revised: 12/06/2015 Document Reviewed: 02/04/2015 Elsevier Interactive Patient Education  2018 [employer]     IF you received an x-ray today, you will receive an invoice from [facility]. Please contact [facility] at [phone] with questions or concerns regarding your invoice.   IF you received labwork today, you will receive an invoice from [facility]. Please contact [facility] at [phone] with questions or concerns regarding your invoice.   Our billing staff will not be able to assist you with questions regarding bills from these companies.  You will be contacted with the lab results as soon as they are available. The fastest way to get your results is to activate your My Chart account. Instructions are located on the last page of this paperwork. If you have not heard from us regarding the results in 2 weeks, please contact this office.

## 2017-03-07 NOTE — Progress Notes (Signed)
  MRN: [id number] DOB: 12/17/1980  Subjective:   Ann Santos is a 36 y.o. female presenting for 2 week history of hoarseness, nasal congestion. She is now having productive cough that elicits shob, chest pressure, has been gagging from this but not vomiting and does not have abdominal pain. Also has left sided ear pain sinus pain. Has tried Mucinex, Sudafed with minimal relief. She is using APAP, humidifier. Denies fever, chest pain. Denies smoking cigarettes. Denies history of asthma. Patient had tooth extraction on 02/23/2017, had one dose of antibiotic prior to that.  Ann Santos has a current medication list which includes the following prescription(s): acetaminophen, azelastine hcl, calcium acetate, cetirizine, cryselle-28, fluticasone, lisdexamfetamine, multivitamin with minerals, probiotic product, sertraline, tobramycin, trazodone, and vitamin b-12. Also has No Known Allergies.  Ann Santos  has a past medical history of Abdominal distention, Abdominal pain, Anxiety, Blood in stool, Constipation, Diverticulitis, Diverticulosis, Gestational hypertension (07/05/2013), Gestational hypertension (07/05/2013), History of chicken pox, History of hiatal hernia, Hypertension, Maternal obesity, antepartum (07/05/2013), Maternal obesity, antepartum (07/05/2013), Numbness, Rectal bleeding, Shortness of breath dyspnea, and Umbilical hernia. Also  has a past surgical history that includes Cesarean section (05/28/09); Scar revision (09/17/09); LAPAROSCOPIC GASTRIC SLEEVE RESECTION (N/A, 11/17/2014); BREATH TEK H PYLORI (N/A, 07/22/2014); REPEAT CESAREAN SECTION (With Special Wound Vac) (N/A, 07/05/2013); HERNIA REPAIR UMBILICAL ADULT (N/A, 11/01/2011); INSERTION OF MESH (N/A, 11/01/2011); COLONOSCOPY (N/A, 10/07/2011); and ESOPHAGOGASTRODUODENOSCOPY (EGD) (N/A, 10/07/2011).  Objective:   Vitals: BP 130/80   Pulse 86   Temp 98.5 F (36.9 C) (Oral)   Resp 17   Ht 5' 2.5" (1.588 m)   Wt 241 lb (109.3 kg)   LMP 02/21/2017 (Approximate)    SpO2 98%   BMI 43.38 kg/m   Physical Exam  Constitutional: She is oriented to person, place, and time. She appears well-developed and well-nourished.  HENT:  TM's intact bilaterally, no effusions or erythema. Nasal turbinates dry and erythematous, left-sided maxillary sinus tenderness. Oropharynx significant post-nasal drainage, mucous membranes moist.  Eyes: Right eye exhibits no discharge. Left eye exhibits no discharge.  Neck: Normal range of motion. Neck supple.  Cardiovascular: Normal rate, regular rhythm and intact distal pulses. Exam reveals no gallop and no friction rub.  No murmur heard. Pulmonary/Chest: No respiratory distress. She has no wheezes. She has no rales.  Lymphadenopathy:    She has no cervical adenopathy.  Neurological: She is alert and oriented to person, place, and time.  Skin: Skin is warm and dry.  Psychiatric: She has a normal mood and affect.   Assessment and Plan :   1. Acute sinusitis, recurrence not specified, unspecified location 2. Cough 3. Post-nasal drainage 4. Hoarseness - Will cover for infectious process with amoxicillin TID as per UpToDate. Supportive care with cough suppression medications recommended otherwise. Return-to-clinic precautions discussed, patient verbalized understanding.   Wallis BambergMario Angeliah Wisdom, PA-C [facility] at [facility] [phone] 03/07/2017  8:54 AM

## 2017-03-12 ENCOUNTER — Encounter (HOSPITAL_COMMUNITY): Payer: Self-pay | Admitting: Emergency Medicine

## 2017-03-12 ENCOUNTER — Emergency Department (HOSPITAL_COMMUNITY)
Admission: EM | Admit: 2017-03-12 | Discharge: 2017-03-12 | Disposition: A | Discharge status: Home / Self Care | Payer: BLUE CROSS/BLUE SHIELD | Attending: Emergency Medicine | Admitting: Emergency Medicine

## 2017-03-12 ENCOUNTER — Other Ambulatory Visit: Payer: Self-pay

## 2017-03-12 DIAGNOSIS — Z8719 Personal history of other diseases of the digestive system: Secondary | ICD-10-CM | POA: Diagnosis not present

## 2017-03-12 DIAGNOSIS — Z79899 Other long term (current) drug therapy: Secondary | ICD-10-CM | POA: Diagnosis not present

## 2017-03-12 DIAGNOSIS — R1032 Left lower quadrant pain: Secondary | ICD-10-CM | POA: Insufficient documentation

## 2017-03-12 LAB — URINALYSIS, ROUTINE W REFLEX MICROSCOPIC
Bilirubin Urine: NEGATIVE
Glucose, UA: NEGATIVE mg/dL
Hgb urine dipstick: NEGATIVE
Ketones, ur: NEGATIVE mg/dL
Leukocytes, UA: NEGATIVE
Nitrite: NEGATIVE
Protein, ur: NEGATIVE mg/dL
Specific Gravity, Urine: 1.024 (ref 1.005–1.030)
pH: 7 (ref 5.0–8.0)

## 2017-03-12 LAB — COMPREHENSIVE METABOLIC PANEL
ALT: 16 U/L (ref 14–54)
AST: 20 U/L (ref 15–41)
Albumin: 3.6 g/dL (ref 3.5–5.0)
Alkaline Phosphatase: 45 U/L (ref 38–126)
Anion gap: 9 (ref 5–15)
BUN: 10 mg/dL (ref 6–20)
CO2: 24 mmol/L (ref 22–32)
Calcium: 8.8 mg/dL — ABNORMAL LOW (ref 8.9–10.3)
Chloride: 105 mmol/L (ref 101–111)
Creatinine, Ser: 0.5 mg/dL (ref 0.44–1.00)
GFR calc Af Amer: >60 mL/min (ref >60)
GFR calc non Af Amer: >60 mL/min (ref >60)
Glucose, Bld: 100 mg/dL — ABNORMAL HIGH (ref 65–99)
Potassium: 4 mmol/L (ref 3.5–5.1)
Sodium: 138 mmol/L (ref 135–145)
Total Bilirubin: 0.6 mg/dL (ref 0.3–1.2)
Total Protein: 7.2 g/dL (ref 6.5–8.1)

## 2017-03-12 LAB — CBC
HCT: 41.5 % (ref 36.0–46.0)
Hemoglobin: 14.6 g/dL (ref 12.0–15.0)
MCH: 31.5 pg (ref 26.0–34.0)
MCHC: 35.2 g/dL (ref 30.0–36.0)
MCV: 89.4 fL (ref 78.0–100.0)
Platelets: 343 10*3/uL (ref 150–400)
RBC: 4.64 MIL/uL (ref 3.87–5.11)
RDW: 12.2 % (ref 11.5–15.5)
WBC: 11 10*3/uL — ABNORMAL HIGH (ref 4.0–10.5)

## 2017-03-12 LAB — I-STAT BETA HCG BLOOD, ED (MC, WL, AP ONLY): I-stat hCG, quantitative: <5 m[IU]/mL (ref <5)

## 2017-03-12 LAB — LIPASE, BLOOD: Lipase: 24 U/L (ref 11–51)

## 2017-03-12 MED ORDER — HYDROMORPHONE HCL 1 MG/ML IJ SOLN
1.0000 mg | Freq: Once | INTRAMUSCULAR | Status: AC
  Administered 2017-03-12: 1 mg via INTRAVENOUS
  Filled 2017-03-12: qty 1

## 2017-03-12 MED ORDER — METRONIDAZOLE 500 MG PO TABS: 500.0000 mg | ORAL_TABLET | Freq: Three times a day (TID) | ORAL | 0 refills | Status: AC

## 2017-03-12 MED ORDER — METRONIDAZOLE 500 MG PO TABS
500.0000 mg | ORAL_TABLET | Freq: Once | ORAL | Status: AC
  Administered 2017-03-12: 500 mg via ORAL
  Filled 2017-03-12: qty 1

## 2017-03-12 MED ORDER — OXYCODONE-ACETAMINOPHEN 5-325 MG PO TABS: 1.0000 | ORAL_TABLET | Freq: Four times a day (QID) | ORAL | 0 refills | Status: DC | PRN

## 2017-03-12 MED ORDER — CIPROFLOXACIN HCL 500 MG PO TABS
500.0000 mg | ORAL_TABLET | Freq: Once | ORAL | Status: AC
  Administered 2017-03-12: 500 mg via ORAL
  Filled 2017-03-12: qty 1

## 2017-03-12 MED ORDER — CIPROFLOXACIN HCL 500 MG PO TABS: 500.0000 mg | ORAL_TABLET | Freq: Two times a day (BID) | ORAL | 0 refills | Status: AC

## 2017-03-12 MED ORDER — ONDANSETRON HCL 4 MG PO TABS: 4.0000 mg | ORAL_TABLET | Freq: Four times a day (QID) | ORAL | 0 refills | Status: DC

## 2017-03-12 MED ORDER — OXYCODONE-ACETAMINOPHEN 5-325 MG PO TABS
1.0000 | ORAL_TABLET | Freq: Once | ORAL | Status: AC
  Administered 2017-03-12: 1 via ORAL
  Filled 2017-03-12: qty 1

## 2017-03-12 NOTE — ED Provider Notes (Signed)
[facility]-EMERGENCY DEPT Provider Note   CSN: [id number] Arrival date & time: 03/12/17  0316     History   Chief Complaint Chief Complaint  Patient presents with  . Abdominal Pain    HPI Ann Santos is a 36 y.o. female.  Patient with a history of diverticulitis, HTN presents with LLQ abdominal pain for the past 1 day. "It feels like diverticulitis". No bloody stool or diarrhea. No fever, nausea or vomiting. He last episode was approximately 5 years ago. She reports the pain has been worsening and tonight she felt it needed to be evaluated. She feels strongly this is familiar to her as diverticulitis.   The history is provided by the patient. No language interpreter was used.  Abdominal Pain   Pertinent negatives include fever, diarrhea and vomiting.    Past Medical History:  Diagnosis Date  . Abdominal distention   . Abdominal pain   . Anxiety   . Blood in stool   . Constipation   . Diverticulitis    Per pt. States she never had CT scan to confirm this  . Diverticulosis    PT H[facility]PITALIZED AT [facility] October 03, 2011  . Gestational hypertension 07/05/2013  . Gestational hypertension 07/05/2013  . History of chicken pox    childhood  . History of hiatal hernia   . Hypertension    During pregnancy only  . Maternal obesity, antepartum 07/05/2013  . Maternal obesity, antepartum 07/05/2013  . Numbness    first two fingers right hand   . Rectal bleeding   . Shortness of breath dyspnea    with exercise  . Umbilical hernia    planning surgical repair    Patient Active Problem List   Diagnosis Date Noted  . Morbid obesity (HCC) 11/17/2014  . Atypical chest pain 07/17/2013  . Diverticular disease 10/23/2012  . Umbilical hernia 10/26/2011  . Anxiety 07/26/2011    Past Surgical History:  Procedure Laterality Date  . BREATH TEK H PYLORI N/A 07/22/2014   Performed by Ovidio Kin, MD at [facility] ENDOSCOPY  . CESAREAN SECTION  05/28/09  . COLONOSCOPY N/A  10/07/2011   Performed by Theda Belfast, MD at [facility] ENDOSCOPY  . ESOPHAGOGASTRODUODENOSCOPY (EGD) N/A 10/07/2011   Performed by Theda Belfast, MD at [facility] ENDOSCOPY  . HERNIA REPAIR UMBILICAL ADULT N/A 11/01/2011   Performed by Velora Heckler, MD at [facility] ORS  . INSERTION OF MESH N/A 11/01/2011   Performed by Velora Heckler, MD at [facility] ORS  . LAPAROSCOPIC GASTRIC SLEEVE RESECTION N/A 11/17/2014   Performed by Ovidio Kin, MD at [facility] ORS  . REPEAT CESAREAN SECTION (With Special Wound Vac) N/A 07/05/2013   Performed by Robley Fries, MD at [facility] ORS  . SCAR REVISION  09/17/09   c-section scar revision    OB History    Gravida Para Term Preterm AB Living   2 2 2     2    SAB TAB Ectopic Multiple Live Births           2       Home Medications    Prior to Admission medications   Medication Sig Start Date End Date Taking? Authorizing Provider  acetaminophen (TYLENOL) 500 MG tablet Take 500 mg by mouth every 6 (six) hours as needed for moderate pain. Reported on 07/20/2015   Yes [provider]  amoxicillin (AMOXIL) 500 MG capsule Take 1 capsule (500 mg total) 3 (three) times daily by mouth.  03/07/17  Yes Wallis BambergMani, Mario, PA-C  benzonatate (TESSALON) 100 MG capsule Take 1-2 capsules (100-200 mg total) 3 (three) times daily as needed by mouth. 03/07/17  Yes Wallis BambergMani, Mario, PA-C  CALCIUM PO Take 1 tablet daily by mouth.   Yes [provider]  cetirizine (ZYRTEC) 10 MG tablet Take 10 mg by mouth daily as needed for allergies. Reported on 07/20/2015   Yes [provider]  CRYSELLE-28 0.3-30 MG-MCG tablet Take 1 tablet by mouth daily. Reported on 07/20/2015 10/30/14  Yes [provider]  lisdexamfetamine (VYVANSE) 30 MG capsule Take 1 capsule (30 mg total) by mouth daily. 02/23/17  Yes Arfeen, Phillips GroutSyed T, MD  LYSINE HCL PO Take 1 tablet daily by mouth.   Yes [provider]  Probiotic Product (PROBIOTIC COLON SUPPORT PO) Take 1 tablet by mouth daily.   Yes [provider]  pseudoephedrine (SUDAFED) 30 MG tablet Take 30 mg every 4 (four) hours as needed by mouth for congestion.   Yes [provider]  sertraline (ZOLOFT) 100 MG tablet Take 1 tablet (100 mg total) by mouth at bedtime. 02/23/17  Yes Arfeen, Phillips GroutSyed T, MD  traZODone (DESYREL) 100 MG tablet Take 1/2 to 1 tab at bed time Patient taking differently: Take 50-100 mg at bedtime as needed by mouth for sleep. Take 1/2 to 1 tab at bed time 02/23/17  Yes Arfeen, Phillips GroutSyed T, MD  Azelastine HCl 0.15 % SOLN Place 2 sprays into both nostrils 2 (two) times daily. Patient not taking: Reported on 03/12/2017 04/23/16   Porfirio OarJeffery, Chelle, PA-C  fluticasone (FLONASE) 50 MCG/ACT nasal spray Place 2 sprays into both nostrils daily as needed for allergies or rhinitis. Patient not taking: Reported on 03/12/2017 04/06/14   Ofilia Neaslark, Michael L, PA-C  HYDROcodone-homatropine [facility](HYCODAN) 5-1.5 MG/5ML syrup Take 5 mLs at bedtime as needed by mouth. Patient not taking: Reported on 03/12/2017 03/07/17   Wallis BambergMani, Mario, PA-C  tobramycin (TOBREX) 0.3 % ophthalmic solution Place 1 drop into both eyes every 6 (six) hours. Patient not taking: Reported on 03/12/2017 05/22/16   Linna HoffKindl, James D, MD    Family History Family History  Problem Relation Age of Onset  . Diabetes Mother   . Hypertension Mother   . Ulcerative colitis Father   . Hypertension Father   . Cancer Maternal Grandmother        lung    Social History Social History   Tobacco Use  . Smoking status: Never Smoker  . Smokeless tobacco: Never Used  Substance Use Topics  . Alcohol use: No    Alcohol/week: 0.0 oz  . Drug use: No     Allergies   Patient has no known allergies.   Review of Systems Review of Systems  Constitutional: Negative for chills and fever.  Respiratory: Negative.  Negative for shortness of breath.   Cardiovascular: Negative.  Negative for chest pain.  Gastrointestinal: Positive for abdominal pain. Negative for blood in stool, diarrhea and  vomiting.  Genitourinary: Negative.   Musculoskeletal: Negative.   Skin: Negative.   Neurological: Negative.      Physical Exam Updated Vital Signs BP 132/87   Pulse 78   Temp 98.5 F (36.9 C) (Oral)   Resp 18   Ht 5' 2.25" (1.581 m)   Wt 109.3 kg (241 lb)   LMP 02/21/2017 (Approximate)   SpO2 99%   BMI 43.73 kg/m   Physical Exam  Constitutional: She appears well-developed and well-nourished.  HENT:  Head: Normocephalic.  Neck: Normal range  of motion. Neck supple.  Cardiovascular: Normal rate and regular rhythm.  Pulmonary/Chest: Effort normal and breath sounds normal.  Abdominal: Soft. Bowel sounds are normal. There is tenderness in the left lower quadrant. There is no rebound and no guarding.  Musculoskeletal: Normal range of motion.  Neurological: She is alert. No cranial nerve deficit.  Skin: Skin is warm and dry. No rash noted.  Psychiatric: She has a normal mood and affect.     ED Treatments / Results  Labs (all labs ordered are listed, but only abnormal results are displayed) Labs Reviewed  COMPREHENSIVE METABOLIC PANEL - Abnormal; Notable for the following components:      Result Value   Glucose, Bld 100 (*)    Calcium 8.8 (*)    All other components within normal limits  CBC - Abnormal; Notable for the following components:   WBC 11.0 (*)    All other components within normal limits  LIPASE, BLOOD  URINALYSIS, ROUTINE W REFLEX MICROSCOPIC  I-STAT BETA HCG BLOOD, ED ([facility], [facility], [facility] ONLY)   Results for orders placed or performed during the hospital encounter of 03/12/17  Lipase, blood  Result Value Ref Range   Lipase 24 11 - 51 U/L  Comprehensive metabolic panel  Result Value Ref Range   Sodium 138 135 - 145 mmol/L   Potassium 4.0 3.5 - 5.1 mmol/L   Chloride 105 101 - 111 mmol/L   CO2 24 22 - 32 mmol/L   Glucose, Bld 100 (H) 65 - 99 mg/dL   BUN 10 6 - 20 mg/dL   Creatinine, Ser 0.450.50 0.44 - 1.00 mg/dL   Calcium 8.8 (L) 8.9 - 10.3 mg/dL   Total  Protein 7.2 6.5 - 8.1 g/dL   Albumin 3.6 3.5 - 5.0 g/dL   AST 20 15 - 41 U/L   ALT 16 14 - 54 U/L   Alkaline Phosphatase 45 38 - 126 U/L   Total Bilirubin 0.6 0.3 - 1.2 mg/dL   GFR calc non Af Amer >60 >60 mL/min   GFR calc Af Amer >60 >60 mL/min   Anion gap 9 5 - 15  CBC  Result Value Ref Range   WBC 11.0 (H) 4.0 - 10.5 K/uL   RBC 4.64 3.87 - 5.11 MIL/uL   Hemoglobin 14.6 12.0 - 15.0 g/dL   HCT 40.941.5 81.136.0 - 91.446.0 %   MCV 89.4 78.0 - 100.0 fL   MCH 31.5 26.0 - 34.0 pg   MCHC 35.2 30.0 - 36.0 g/dL   RDW 78.212.2 95.611.5 - 21.315.5 %   Platelets 343 150 - 400 K/uL  Urinalysis, Routine w reflex microscopic  Result Value Ref Range   Color, Urine YELLOW YELLOW   APPearance CLEAR CLEAR   Specific Gravity, Urine 1.024 1.005 - 1.030   pH 7.0 5.0 - 8.0   Glucose, UA NEGATIVE NEGATIVE mg/dL   Hgb urine dipstick NEGATIVE NEGATIVE   Bilirubin Urine NEGATIVE NEGATIVE   Ketones, ur NEGATIVE NEGATIVE mg/dL   Protein, ur NEGATIVE NEGATIVE mg/dL   Nitrite NEGATIVE NEGATIVE   Leukocytes, UA NEGATIVE NEGATIVE  I-Stat beta hCG blood, ED  Result Value Ref Range   I-stat hCG, quantitative <5.0 <5 mIU/mL   Comment 3            EKG  EKG Interpretation None       Radiology No results found.  Procedures Procedures (including critical care time)  Medications Ordered in ED Medications  HYDROmorphone (DILAUDID) injection 1 mg (1 mg Intravenous Given  03/12/17 0511)     Initial Impression / Assessment and Plan / ED Course  I have reviewed the triage vital signs and the nursing notes.  Pertinent labs & imaging results that were available during my care of the patient were reviewed by me and considered in my medical decision making (see chart for details).     Patient is here with LLQ abdominal pain similar to previous diverticulitis. No fever, leukocytosis, vomiting or bloody stool. She has had previous symptoms that are c/w diverticulitis.   Pain is improved with Dilaudid but not resolved.  Feel we can transition to oral pain relievers, start Cipro and Flagyl. Do not feel CT would be of benefit. Strict return precautions discussed including symptoms that would require immediate return to the emergency department including severe pain, fever, vomiting or bloody stools. Patient is considered reliable to outpatient follow up with her GI if symptoms persist or for return to the emergency department with any red flag symptoms as discussed.  Final Clinical Impressions(s) / ED Diagnoses   Final diagnoses:  None   1. LLQ abdominal pain 2. History of diverticulitis.  ED Discharge Orders    None       Elpidio Anis, PA-C 03/12/17 [id number]    Shon Baton, MD 03/12/17 [phone]

## 2017-03-12 NOTE — ED Triage Notes (Signed)
Patient complaining of left lower abdominal pain. Patient states it is a lot of pressure and is tender to touch. Patient states this started Friday. She states she has a hx of diverticulitis.

## 2017-03-21 ENCOUNTER — Encounter (HOSPITAL_COMMUNITY): Payer: Self-pay | Admitting: Psychiatry

## 2017-03-21 ENCOUNTER — Ambulatory Visit (INDEPENDENT_AMBULATORY_CARE_PROVIDER_SITE_OTHER): Payer: BLUE CROSS/BLUE SHIELD | Admitting: Psychiatry

## 2017-03-21 DIAGNOSIS — K5792 Diverticulitis of intestine, part unspecified, without perforation or abscess without bleeding: Secondary | ICD-10-CM

## 2017-03-21 DIAGNOSIS — F419 Anxiety disorder, unspecified: Secondary | ICD-10-CM | POA: Diagnosis not present

## 2017-03-21 DIAGNOSIS — Z79899 Other long term (current) drug therapy: Secondary | ICD-10-CM

## 2017-03-21 DIAGNOSIS — F330 Major depressive disorder, recurrent, mild: Secondary | ICD-10-CM

## 2017-03-21 DIAGNOSIS — F900 Attention-deficit hyperactivity disorder, predominantly inattentive type: Secondary | ICD-10-CM | POA: Diagnosis not present

## 2017-03-21 MED ORDER — LISDEXAMFETAMINE DIMESYLATE 30 MG PO CAPS: 30.0000 mg | ORAL_CAPSULE | Freq: Every day | ORAL | 0 refills | Status: DC

## 2017-03-21 MED ORDER — SERTRALINE HCL 100 MG PO TABS: 100.0000 mg | ORAL_TABLET | Freq: Every day | ORAL | 0 refills | Status: DC

## 2017-03-21 MED ORDER — TRAZODONE HCL 50 MG PO TABS: 50.0000 mg | ORAL_TABLET | Freq: Every day | ORAL | 0 refills | Status: DC

## 2017-03-21 NOTE — Progress Notes (Signed)
[facility] MD/PA/NP OP Progress Note  03/21/2017 8:10 AM Ann Santos  MRN:  [id number]  Chief Complaint: I was in the emergency room because of abdominal pain but I am feeling better now.  HPI: Patient came for her follow-up appointment.  She was in the emergency room recently for abdominal pain and she was diagnosed with diverticulitis.  She was given antibiotic and she is feeling much better.  She reported medicine working very well.  She has no irritability, anger or mania.  Her attention and concentration is good.  She is able to do multitasking.  She had a good Thanksgiving.  She is pleased that her daughter seeing child psychiatry in this office and doing much better.  She wanted to have her husband to be seen by psychiatry because she believes he needs some help.  She admitted not able to see her therapist [name] in a while because she was busy with her daughter in soccer practice.  She is sleeping good.  She only takes half trazodone.  Patient denies drinking alcohol or using any illegal substances.  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  She has no tremors, shakes or any EPS.  Her appetite is okay.  Her vital signs are stable.  Visit Diagnosis:    ICD-10-CM   1. Anxiety F41.9 sertraline (ZOLOFT) 100 MG tablet  2. Mild episode of recurrent major depressive disorder (HCC) F33.0 sertraline (ZOLOFT) 100 MG tablet    traZODone (DESYREL) 50 MG tablet  3. Attention deficit hyperactivity disorder (ADHD), predominantly inattentive type F90.0 lisdexamfetamine (VYVANSE) 30 MG capsule    DISCONTINUED: lisdexamfetamine (VYVANSE) 30 MG capsule    DISCONTINUED: lisdexamfetamine (VYVANSE) 30 MG capsule    Past Psychiatric History: Reviewed.   Patient started taking antidepressant 8 years ago when she was in [place]. At that time her husband was going through drug addiction and she was very depressed. She had tried Celexa, Wellbutrin, Prozac and recently Strattera.  She's been taking Zoloft and trazodone for quite a few years but is working very well. She had [place] but most of the time her medicines were given by her primary care physician. Patient denies any history of psychiatric inpatient treatment. Patient denies any history of mania, psychosis, hallucination or any suicidal attempt  Past Medical History:  Past Medical History:  Diagnosis Date  . Abdominal distention   . Abdominal pain   . Anxiety   . Blood in stool   . Constipation   . Diverticulitis    Per pt. States she never had CT scan to confirm this  . Diverticulosis    [facility]  . Gestational hypertension 07/05/2013  . Gestational hypertension 07/05/2013  . History of chicken pox    childhood  . History of hiatal hernia   . Hypertension    During pregnancy only  . Maternal obesity, antepartum 07/05/2013  . Maternal obesity, antepartum 07/05/2013  . Numbness    first two fingers right hand   . Rectal bleeding   . Shortness of breath dyspnea    with exercise  . Umbilical hernia    planning surgical repair    Past Surgical History:  Procedure Laterality Date  . BREATH TEK H PYLORI N/A 07/22/2014   Procedure: BREATH TEK H PYLORI;  Surgeon: Ovidio Kinavid Newman, MD;  [facility];  Service: General;  Laterality: N/A;  . CESAREAN SECTION  05/28/09  . CESAREAN  SECTION N/A 07/05/2013   Procedure: REPEAT CESAREAN SECTION (With Special Wound Vac);  Surgeon: Robley FriesVaishali R Mody, MD;  Location: [facility] ORS;  Service: Obstetrics;  Laterality: N/A;   EDD: 07/23/13  . COLONOSCOPY  10/07/2011   Procedure: COLONOSCOPY;  Surgeon: Theda BelfastPatrick D Hung, MD;  Location: [facility] ENDOSCOPY;  Service: Endoscopy;  Laterality: N/A;  . ESOPHAGOGASTRODUODENOSCOPY  10/07/2011   Procedure: ESOPHAGOGASTRODUODENOSCOPY (EGD);  Surgeon: Theda BelfastPatrick D Hung, MD;  [facility];  Service: Endoscopy;  Laterality: N/A;  . LAPAROSCOPIC GASTRIC SLEEVE RESECTION WITH  HIATAL HERNIA REPAIR N/A 11/17/2014   Procedure: LAPAROSCOPIC GASTRIC SLEEVE RESECTION ;  Surgeon: Ovidio Kinavid Newman, MD;  Location: [facility] ORS;  Service: General;  Laterality: N/A;  . SCAR REVISION  09/17/09   c-section scar revision  . UMBILICAL HERNIA REPAIR  11/01/2011   Procedure: HERNIA REPAIR UMBILICAL ADULT;  Surgeon: Velora Hecklerodd M Gerkin, MD;  Location: [facility] ORS;  Service: General;  Laterality: N/A;    Family Psychiatric History: Viewed.  Family History:  Family History  Problem Relation Age of Onset  . Diabetes Mother   . Hypertension Mother   . Ulcerative colitis Father   . Hypertension Father   . Cancer Maternal Grandmother        lung    Social History:  Social History   Socioeconomic History  . Marital status: Married    Spouse name: Not on file  . Number of children: Not on file  . Years of education: Not on file  . Highest education level: Not on file  Social Needs  . Financial resource strain: Not on file  . Food insecurity - worry: Not on file  . Food insecurity - inability: Not on file  . Transportation needs - medical: Not on file  . Transportation needs - non-medical: Not on file  Occupational History  . Not on file  Tobacco Use  . Smoking status: Never Smoker  . Smokeless tobacco: Never Used  Substance and Sexual Activity  . Alcohol use: No    Alcohol/week: 0.0 oz  . Drug use: No  . Sexual activity: Yes    Partners: Male    Birth control/protection: Pill  Other Topics Concern  . Not on file  Social History Narrative  . Not on file    Allergies: No Known Allergies  Metabolic Disorder Labs: Lab Results  Component Value Date   HGBA1C 4.6 07/07/2016   MPG 85 07/07/2016   No results found for: PROLACTIN Lab Results  Component Value Date   CHOL 182 10/26/2011   TRIG 140 10/26/2011   HDL 47 10/26/2011   CHOLHDL 3.9 10/26/2011   VLDL 28 10/26/2011   LDLCALC 107 (H) 10/26/2011   Lab Results  Component Value Date   TSH 1.83 07/07/2016   TSH 1.723  10/02/2014    Therapeutic Level Labs: No results found for: LITHIUM No results found for: VALPROATE No components found for:  CBMZ  Current Medications: Current Outpatient Medications  Medication Sig Dispense Refill  . acetaminophen (TYLENOL) 500 MG tablet Take 500 mg by mouth every 6 (six) hours as needed for moderate pain. Reported on 07/20/2015    . amoxicillin (AMOXIL) 500 MG capsule Take 1 capsule (500 mg total) 3 (three) times daily by mouth. 21 capsule 0  . Azelastine HCl 0.15 % SOLN Place 2 sprays into both nostrils 2 (two) times daily. (Patient not taking: Reported on 03/12/2017) 30 mL 0  . benzonatate (TESSALON) 100 MG capsule Take 1-2 capsules (100-200 mg total) 3 (three)  times daily as needed by mouth. 60 capsule 0  . CALCIUM PO Take 1 tablet daily by mouth.    . cetirizine (ZYRTEC) 10 MG tablet Take 10 mg by mouth daily as needed for allergies. Reported on 07/20/2015    . ciprofloxacin (CIPRO) 500 MG tablet Take 1 tablet (500 mg total) 2 (two) times daily for 10 days by mouth. 20 tablet 0  . CRYSELLE-28 0.3-30 MG-MCG tablet Take 1 tablet by mouth daily. Reported on 07/20/2015  12  . fluticasone (FLONASE) 50 MCG/ACT nasal spray Place 2 sprays into both nostrils daily as needed for allergies or rhinitis. (Patient not taking: Reported on 03/12/2017) 15.8 g 0  . HYDROcodone-homatropine (HYCODAN) 5-1.5 MG/5ML syrup Take 5 mLs at bedtime as needed by mouth. (Patient not taking: Reported on 03/12/2017) 100 mL 0  . lisdexamfetamine (VYVANSE) 30 MG capsule Take 1 capsule (30 mg total) by mouth daily. 31 capsule 0  . LYSINE HCL PO Take 1 tablet daily by mouth.    . metroNIDAZOLE (FLAGYL) 500 MG tablet Take 1 tablet (500 mg total) 3 (three) times daily for 10 days by mouth. 30 tablet 0  . ondansetron (ZOFRAN) 4 MG tablet Take 1 tablet (4 mg total) every 6 (six) hours by mouth. 12 tablet 0  . oxyCODONE-acetaminophen (PERCOCET/ROXICET) 5-325 MG tablet Take 1-2 tablets every 6 (six) hours as  needed by mouth for severe pain. 8 tablet 0  . Probiotic Product (PROBIOTIC COLON SUPPORT PO) Take 1 tablet by mouth daily.    . pseudoephedrine (SUDAFED) 30 MG tablet Take 30 mg every 4 (four) hours as needed by mouth for congestion.    . sertraline (ZOLOFT) 100 MG tablet Take 1 tablet (100 mg total) by mouth at bedtime. 30 tablet 0  . tobramycin (TOBREX) 0.3 % ophthalmic solution Place 1 drop into both eyes every 6 (six) hours. (Patient not taking: Reported on 03/12/2017) 5 mL 0  . traZODone (DESYREL) 100 MG tablet Take 1/2 to 1 tab at bed time (Patient taking differently: Take 50-100 mg at bedtime as needed by mouth for sleep. Take 1/2 to 1 tab at bed time) 30 tablet 0   No current facility-administered medications for this visit.      Musculoskeletal: Strength & Muscle Tone: within normal limits Gait & Station: normal Patient leans: N/A  Psychiatric Specialty Exam: Review of Systems  HENT: Negative.   Respiratory: Negative.   Cardiovascular: Negative.   Gastrointestinal: Positive for nausea.  Musculoskeletal: Negative.   Skin: Negative.   Neurological: Negative.     Blood pressure 122/78, pulse 92, height 5\' 2"  (1.575 m), weight 239 lb (108.4 kg), last menstrual period 02/21/2017, SpO2 98 %.Body mass index is 43.71 kg/m.  General Appearance: Casual  Eye Contact:  Good  Speech:  Clear and Coherent  Volume:  Normal  Mood:  Euthymic  Affect:  Congruent  Thought Process:  Goal Directed  Orientation:  Full (Time, Place, and Person)  Thought Content: Logical   Suicidal Thoughts:  No  Homicidal Thoughts:  No  Memory:  Immediate;   Good Recent;   Good Remote;   Good  Judgement:  Good  Insight:  Good  Psychomotor Activity:  Slightly increased  Concentration:  Concentration: Fair and Attention Span: Fair  Recall:  Good  Fund of Knowledge: Good  Language: Good  Akathisia:  No  Handed:  Right  AIMS (if indicated): not done  Assets:  Communication Skills Desire for  Improvement Housing Resilience Social Support  ADL's:  Intact  Cognition: WNL  Sleep:  Good   Screenings: PHQ2-9     Office Visit from 03/07/2017 in Primary Care at [facility] Visit from 04/29/2016 in Primary Care at [facility] Visit from 04/23/2016 in Primary Care at [facility] Visit from 11/07/2015 in Primary Care at [facility] Visit from 07/20/2015 in Primary Care at [facility] Total Score  0  0  0  0  0       Assessment and Plan: Major depressive disorder, recurrent.  Attention deficit disorder, inattentive type  I review records from the emergency room and recent blood work results.  Patient doing better on her current medication.  She is pleased that her daughter is much better and seeing child psychiatry in this office.  Continue Vyvanse 30 mg daily, Zoloft 100 mg daily and trazodone 50 mg at bedtime for insomnia.  Discussed medication side effects and benefits.  Discussed stimulant abuse, tolerance and withdrawal.  Recommended to call us back if she has any question or any concern.  Follow-up in 3 months.   Cleotis Nipper, MD 03/21/2017, 8:10 AM

## 2017-03-29 DIAGNOSIS — F4323 Adjustment disorder with mixed anxiety and depressed mood: Secondary | ICD-10-CM | POA: Diagnosis not present

## 2017-05-10 DIAGNOSIS — F4323 Adjustment disorder with mixed anxiety and depressed mood: Secondary | ICD-10-CM | POA: Diagnosis not present

## 2017-05-31 DIAGNOSIS — F4323 Adjustment disorder with mixed anxiety and depressed mood: Secondary | ICD-10-CM | POA: Diagnosis not present

## 2017-06-07 DIAGNOSIS — F4323 Adjustment disorder with mixed anxiety and depressed mood: Secondary | ICD-10-CM | POA: Diagnosis not present

## 2017-06-21 ENCOUNTER — Encounter (HOSPITAL_COMMUNITY): Payer: Self-pay | Admitting: Psychiatry

## 2017-06-21 ENCOUNTER — Ambulatory Visit (INDEPENDENT_AMBULATORY_CARE_PROVIDER_SITE_OTHER): Payer: BLUE CROSS/BLUE SHIELD | Admitting: Psychiatry

## 2017-06-21 DIAGNOSIS — F900 Attention-deficit hyperactivity disorder, predominantly inattentive type: Secondary | ICD-10-CM | POA: Diagnosis not present

## 2017-06-21 DIAGNOSIS — F419 Anxiety disorder, unspecified: Secondary | ICD-10-CM | POA: Diagnosis not present

## 2017-06-21 DIAGNOSIS — F330 Major depressive disorder, recurrent, mild: Secondary | ICD-10-CM

## 2017-06-21 DIAGNOSIS — F4323 Adjustment disorder with mixed anxiety and depressed mood: Secondary | ICD-10-CM | POA: Diagnosis not present

## 2017-06-21 MED ORDER — SERTRALINE HCL 100 MG PO TABS: 100.0000 mg | ORAL_TABLET | Freq: Every day | ORAL | 0 refills | Status: DC

## 2017-06-21 MED ORDER — LISDEXAMFETAMINE DIMESYLATE 30 MG PO CAPS: 30.0000 mg | ORAL_CAPSULE | Freq: Every day | ORAL | 0 refills | Status: DC

## 2017-06-21 MED ORDER — TRAZODONE HCL 50 MG PO TABS: 50.0000 mg | ORAL_TABLET | Freq: Every day | ORAL | 0 refills | Status: DC

## 2017-06-21 NOTE — Progress Notes (Signed)
BH MD/PA/NP OP Progress Note  06/21/2017 8:13 AM Ann Santos  MRN:  [id number]  Chief Complaint: I am stressed about my husband.  He is seeing psychiatrist but not seeing a therapist.  HPI: Patient came for her follow-up appointment.  She is compliant with medication and denies any side effects.  She is stressed about her husband who recently saw Dr. Suann Larry however he was recommended TMS but due to financial reason unable to do the TMS.  He was also recommended to see a therapist but patient's husband did not make a follow-up.  Patient told that there has been marital issues or significant in past few months.  But she is relieved that husband is now staying home and working from the home.  He is also more cooperative in daily activities.  Patient told her husband is taking amitriptyline which is helping his mood but she wished that he sees therapy.  Patient seeing her therapist Hale Bogus regularly.  She is busy taking care of her daughter who is now in sports.  Her daughter also seeing psychiatry in this office.  She is sleeping good.  She denies any mania, psychosis or any hallucination.  Her attention consultation is good.  She admitted sometimes get frustrated with her marriage but denies any suicidal thoughts or homicidal thought.  She has no tremors, shakes or any EPS.  Her appetite is okay.  Patient denies drinking alcohol or using any illegal substances  Visit Diagnosis:    ICD-10-CM   1. Mild episode of recurrent major depressive disorder (HCC) F33.0 traZODone (DESYREL) 50 MG tablet    sertraline (ZOLOFT) 100 MG tablet  2. Anxiety F41.9 sertraline (ZOLOFT) 100 MG tablet  3. Attention deficit hyperactivity disorder (ADHD), predominantly inattentive type F90.0 lisdexamfetamine (VYVANSE) 30 MG capsule    DISCONTINUED: lisdexamfetamine (VYVANSE) 30 MG capsule    Past Psychiatric History: Viewed. Patient started taking antidepressant 8 years ago when she was in [place]. At that time her  husband was going through drug addiction and she was very depressed. She had tried Celexa, Wellbutrin, Prozac and recently Strattera. She's been taking Zoloft and trazodone for quite a few years but is working very well. She had briefly seen psychiatrist when she was [facility] but most of the time her medicines were given by her primary care physician. Patient denies any history of psychiatric inpatient treatment. Patient denies any history of mania, psychosis, hallucination or any suicidal attempt  Past Medical History:  Past Medical History:  Diagnosis Date  . Abdominal distention   . Abdominal pain   . Anxiety   . Blood in stool   . Constipation   . Diverticulitis    Per pt. States she never had CT scan to confirm this  . Diverticulosis    PT HOSPITALIZED AT [facility] October 03, 2011  . Gestational hypertension 07/05/2013  . Gestational hypertension 07/05/2013  . History of chicken pox    childhood  . History of hiatal hernia   . Hypertension    During pregnancy only  . Maternal obesity, antepartum 07/05/2013  . Maternal obesity, antepartum 07/05/2013  . Numbness    first two fingers right hand   . Rectal bleeding   . Shortness of breath dyspnea    with exercise  . Umbilical hernia    planning surgical repair    Past Surgical History:  Procedure Laterality Date  . BREATH TEK H PYLORI N/A 07/22/2014   Procedure: BREATH TEK H PYLORI;  Surgeon: Ovidio Kin, MD;  Location: [facility];  Service: General;  Laterality: N/A;  . CESAREAN SECTION  05/28/09  . CESAREAN SECTION N/A 07/05/2013   Procedure: REPEAT CESAREAN SECTION (With Special Wound Vac);  Surgeon: Robley FriesVaishali R Mody, MD;  Location: [facility];  Service: Obstetrics;  Laterality: N/A;   EDD: 07/23/13  . COLONOSCOPY  10/07/2011   Procedure: COLONOSCOPY;  Surgeon: Theda BelfastPatrick D Hung, MD;  Location: [facility];  Service: Endoscopy;  Laterality: N/A;  . ESOPHAGOGASTRODUODENOSCOPY  10/07/2011   Procedure: ESOPHAGOGASTRODUODENOSCOPY (EGD);   Surgeon: Theda BelfastPatrick D Hung, MD;  [facility];  Service: Endoscopy;  Laterality: N/A;  . LAPAROSCOPIC GASTRIC SLEEVE RESECTION WITH HIATAL HERNIA REPAIR N/A 11/17/2014   Procedure: LAPAROSCOPIC GASTRIC SLEEVE RESECTION ;  Surgeon: Ovidio Kinavid Newman, MD;  Location: [facility];  Service: General;  Laterality: N/A;  . SCAR REVISION  09/17/09   c-section scar revision  . UMBILICAL HERNIA REPAIR  11/01/2011   Procedure: HERNIA REPAIR UMBILICAL ADULT;  Surgeon: Velora Hecklerodd M Gerkin, MD;  Location: [facility];  Service: General;  Laterality: N/A;    Family Psychiatric History: Viewed  Family History:  Family History  Problem Relation Age of Onset  . Diabetes Mother   . Hypertension Mother   . Ulcerative colitis Father   . Hypertension Father   . Cancer Maternal Grandmother        lung    Social History:  Social History   Socioeconomic History  . Marital status: Married    Spouse name: None  . Number of children: None  . Years of education: None  . Highest education level: None  Social Needs  . Financial resource strain: None  . Food insecurity - worry: None  . Food insecurity - inability: None  . Transportation needs - medical: None  . Transportation needs - non-medical: None  Occupational History  . None  Tobacco Use  . Smoking status: Never Smoker  . Smokeless tobacco: Never Used  Substance and Sexual Activity  . Alcohol use: No    Alcohol/week: 0.0 oz  . Drug use: No  . Sexual activity: Yes    Partners: Male    Birth control/protection: Pill  Other Topics Concern  . None  Social History Narrative  . None    Allergies: No Known Allergies  Metabolic Disorder Labs: Lab Results  Component Value Date   HGBA1C 4.6 07/07/2016   MPG 85 07/07/2016   No results found for: PROLACTIN Lab Results  Component Value Date   CHOL 182 10/26/2011   TRIG 140 10/26/2011   HDL 47 10/26/2011   CHOLHDL 3.9 10/26/2011   VLDL 28 10/26/2011   LDLCALC 107 (H) 10/26/2011   Lab Results   Component Value Date   TSH 1.83 07/07/2016   TSH 1.723 10/02/2014    Therapeutic Level Labs: No results found for: LITHIUM No results found for: VALPROATE No components found for:  CBMZ  Current Medications: Current Outpatient Medications  Medication Sig Dispense Refill  . acetaminophen (TYLENOL) 500 MG tablet Take 500 mg by mouth every 6 (six) hours as needed for moderate pain. Reported on 07/20/2015    . Azelastine HCl 0.15 % SOLN Place 2 sprays into both nostrils 2 (two) times daily. 30 mL 0  . CALCIUM PO Take 1 tablet daily by mouth.    . cetirizine (ZYRTEC) 10 MG tablet Take 10 mg by mouth daily as needed for allergies. Reported on 07/20/2015    . CRYSELLE-28 0.3-30 MG-MCG tablet Take 1 tablet by mouth daily. Reported on 07/20/2015  12  . fluticasone (FLONASE) 50 MCG/ACT nasal spray Place 2 sprays into both nostrils daily as needed for allergies or rhinitis. 15.8 g 0  . lisdexamfetamine (VYVANSE) 30 MG capsule Take 1 capsule (30 mg total) by mouth daily. 31 capsule 0  . LYSINE HCL PO Take 1 tablet daily by mouth.    . Probiotic Product (PROBIOTIC COLON SUPPORT PO) Take 1 tablet by mouth daily.    . pseudoephedrine (SUDAFED) 30 MG tablet Take 30 mg every 4 (four) hours as needed by mouth for congestion.    . sertraline (ZOLOFT) 100 MG tablet Take 1 tablet (100 mg total) by mouth at bedtime. 90 tablet 0  . traZODone (DESYREL) 50 MG tablet Take 1 tablet (50 mg total) by mouth at bedtime. 90 tablet 0  . tobramycin (TOBREX) 0.3 % ophthalmic solution Place 1 drop into both eyes every 6 (six) hours. 5 mL 0   No current facility-administered medications for this visit.      Musculoskeletal: Strength & Muscle Tone: within normal limits Gait & Station: normal Patient leans: N/A  Psychiatric Specialty Exam: ROS  Blood pressure (!) 131/94, pulse 80, height 5\' 2"  (1.575 m), weight 244 lb 3.2 oz (110.8 kg).Body mass index is 44.66 kg/m.  General Appearance: Casual  Eye Contact:  Good   Speech:  fast  Volume:  Normal  Mood:  Anxious  Affect:  Appropriate  Thought Process:  Goal Directed  Orientation:  Full (Time, Place, and Person)  Thought Content: Rumination   Suicidal Thoughts:  No  Homicidal Thoughts:  No  Memory:  Immediate;   Good Recent;   Good Remote;   Good  Judgement:  Good  Insight:  Fair  Psychomotor Activity:  Normal  Concentration:  Concentration: Fair and Attention Span: Fair  Recall:  Good  Fund of Knowledge: Good  Language: Good  Akathisia:  No  Handed:  Right  AIMS (if indicated): not done  Assets:  Communication Skills Desire for Improvement Housing  ADL's:  Intact  Cognition: WNL  Sleep:  Fair   Screenings: PHQ2-9     Office Visit from 03/07/2017 in Primary Care at [place] Office Visit from 04/29/2016 in Primary Care at [facility] Visit from 04/23/2016 in Primary Care at [facility] Visit from 11/07/2015 in Primary Care at [facility] Visit from 07/20/2015 in Primary Care at [facility] Total Score  0  0  0  0  0       Assessment and Plan: Major depressive disorder, recurrent.  Anxiety disorder NOS.  Attention deficit disorder, inattentive type.  Reassurance given.  Recommended marriage counseling and provided name of Melchor Amour for counseling.  Continue Vyvanse 30 mg daily, Zoloft 100 mg daily and trazodone 50 mg at bedtime.  Discussed medication side effects and benefits.  Patient does not ask early refills for her stimulants.  Recommended to call us back if she has any question or any concern.  Follow-up in 3 months.   Cleotis Nipper, MD 06/21/2017, 8:13 AM

## 2017-07-12 DIAGNOSIS — F4323 Adjustment disorder with mixed anxiety and depressed mood: Secondary | ICD-10-CM | POA: Diagnosis not present

## 2017-07-31 DIAGNOSIS — Z6841 Body Mass Index (BMI) 40.0 and over, adult: Secondary | ICD-10-CM | POA: Diagnosis not present

## 2017-07-31 DIAGNOSIS — Z01419 Encounter for gynecological examination (general) (routine) without abnormal findings: Secondary | ICD-10-CM | POA: Diagnosis not present

## 2017-07-31 DIAGNOSIS — R87610 Atypical squamous cells of undetermined significance on cytologic smear of cervix (ASC-US): Secondary | ICD-10-CM | POA: Diagnosis not present

## 2017-08-01 DIAGNOSIS — F4323 Adjustment disorder with mixed anxiety and depressed mood: Secondary | ICD-10-CM | POA: Diagnosis not present

## 2017-09-01 ENCOUNTER — Ambulatory Visit (INDEPENDENT_AMBULATORY_CARE_PROVIDER_SITE_OTHER): Payer: BLUE CROSS/BLUE SHIELD | Admitting: Physician Assistant

## 2017-09-01 ENCOUNTER — Other Ambulatory Visit: Payer: Self-pay

## 2017-09-01 ENCOUNTER — Encounter: Payer: Self-pay | Admitting: Physician Assistant

## 2017-09-01 VITALS — BP 140/98 | HR 82 | Temp 99.1°F | Resp 16 | Ht 62.0 in | Wt 244.4 lb

## 2017-09-01 DIAGNOSIS — R030 Elevated blood-pressure reading, without diagnosis of hypertension: Secondary | ICD-10-CM

## 2017-09-01 DIAGNOSIS — R0689 Other abnormalities of breathing: Secondary | ICD-10-CM

## 2017-09-01 DIAGNOSIS — R05 Cough: Secondary | ICD-10-CM | POA: Diagnosis not present

## 2017-09-01 DIAGNOSIS — R059 Cough, unspecified: Secondary | ICD-10-CM

## 2017-09-01 DIAGNOSIS — J069 Acute upper respiratory infection, unspecified: Secondary | ICD-10-CM | POA: Diagnosis not present

## 2017-09-01 MED ORDER — IPRATROPIUM BROMIDE 0.02 % IN SOLN
0.5000 mg | Freq: Once | RESPIRATORY_TRACT | Status: AC
  Administered 2017-09-01: 0.5 mg via RESPIRATORY_TRACT

## 2017-09-01 MED ORDER — AZITHROMYCIN 250 MG PO TABS: ORAL_TABLET | ORAL | 0 refills | Status: AC

## 2017-09-01 MED ORDER — ALBUTEROL SULFATE (2.5 MG/3ML) 0.083% IN NEBU
2.5000 mg | INHALATION_SOLUTION | Freq: Once | RESPIRATORY_TRACT | Status: AC
  Administered 2017-09-01: 2.5 mg via RESPIRATORY_TRACT

## 2017-09-01 MED ORDER — PREDNISONE 20 MG PO TABS: ORAL_TABLET | ORAL | 0 refills | Status: AC

## 2017-09-01 NOTE — Progress Notes (Signed)
09/01/2017 5:04 PM   DOB: 1980-06-12 / MRN: [id number]  SUBJECTIVE:  Ann Santos is a 37 y.o. female presenting for 3 weeks of productive cough.  Associates low grade fever and fatigue.  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 Denies leg swelling, SOB, leg swelling and chest pain. Has tried numerous OTC preps without relief.  Feels that she is not getting better or worse.  Tells me this all started with a cold.     Depression screen PHQ 2/9 09/01/2017  Decreased Interest 0  Down, Depressed, Hopeless 0  PHQ - 2 Score 0    She has No Known Allergies.   She  has a past medical history of Abdominal distention, Abdominal pain, Anxiety, Blood in stool, Constipation, Diverticulitis, Diverticulosis, Gestational hypertension (07/05/2013), Gestational hypertension (07/05/2013), History of chicken pox, History of hiatal hernia, Hypertension, Maternal obesity, antepartum (07/05/2013), Maternal obesity, antepartum (07/05/2013), Numbness, Rectal bleeding, Shortness of breath dyspnea, and Umbilical hernia.    She  reports that she has never smoked. She has never used smokeless tobacco. She reports that she does not drink alcohol or use drugs. She  reports that she currently engages in sexual activity and has had partners who are Female. She reports using the following method of birth control/protection: Pill. The patient  has a past surgical history that includes Colonoscopy (10/07/2011); Esophagogastroduodenoscopy (10/07/2011); Cesarean section (05/28/09); Scar revision (09/17/09); Umbilical hernia repair (11/01/2011); Cesarean section (N/A, 07/05/2013); Breath tek h pylori (N/A, 07/22/2014); and Laparoscopic gastric sleeve resection with hiatal hernia repair (N/A, 11/17/2014).  Her family history includes Cancer in her maternal grandmother; Diabetes in her mother; Hypertension in her father and mother; Ulcerative colitis in her father.  Review of Systems  Constitutional: Positive for malaise/fatigue. Negative for  diaphoresis.  Gastrointestinal: Negative for abdominal pain, diarrhea, nausea and vomiting.  Musculoskeletal: Negative for myalgias.  Skin: Negative for rash.  Neurological: Negative for dizziness.    The problem list and medications were reviewed and updated by myself where necessary and exist elsewhere in the encounter.   OBJECTIVE:   BP (!) 140/98 (BP Location: Left Arm, Patient Position: Sitting, Cuff Size: Large)   Pulse 82   Temp 99.1 F (37.3 C) (Oral)   Resp 16   Ht  (1.575 m)   Wt 244 lb 6.4 oz (110.9 kg)   LMP 08/02/2017   SpO2 98%   BMI 44.70 kg/m   BP Readings from Last 3 Encounters:  09/01/17 (!) 140/98  03/12/17 132/87  03/07/17 130/80     Physical Exam  Constitutional: She is oriented to person, place, and time. She appears well-nourished. No distress.  Eyes: Pupils are equal, round, and reactive to light. EOM are normal.  Cardiovascular: Normal rate, regular rhythm, S1 normal, S2 normal, normal heart sounds and intact distal pulses. Exam reveals no gallop, no friction rub and no decreased pulses.  No murmur heard. Pulmonary/Chest: Effort normal. No stridor. No respiratory distress. She has no wheezes. She has no rales.  Breath sounds rhonchorous in the bilateral lower fields.   Abdominal: She exhibits no distension.  Musculoskeletal: She exhibits no edema.  Neurological: She is alert and oriented to person, place, and time. No cranial nerve deficit. Gait normal.  Skin: Skin is dry. She is not diaphoretic.  Psychiatric: She has a normal mood and affect.  Vitals reviewed.   Lab Results  Component Value Date   HGBA1C 4.6 07/07/2016     No results found for this  or any previous visit (from the past 72 hour(s)).  No results found.  ASSESSMENT AND PLAN:  Ann Santos was seen today for cough.  Diagnoses and all orders for this visit:  Adventitious breath sounds -     albuterol (PROVENTIL) (2.5 MG/3ML) 0.083% nebulizer solution 2.5 mg -      ipratropium (ATROVENT) nebulizer solution 0.5 mg -     predniSONE (DELTASONE) 20 MG tablet; Take 3 in the morning for 3 days, then 2 in the morning for 3 days, and then 1 in the morning for 3 days. -     azithromycin (ZITHROMAX) 250 MG tablet; Take 2 tabs PO x 1 dose, then 1 tab PO QD x 4 days  Protracted URI  Cough -     Care order/instruction:  Elevated BP without diagnosis of hypertension: Per AVS.      The patient is advised to call or return to clinic if she does not see an improvement in symptoms, or to seek the care of the closest emergency department if she worsens with the above plan.   Deliah Boston, MHS, PA-C Primary Care at [facility] 09/01/2017 5:04 PM

## 2017-09-01 NOTE — Patient Instructions (Addendum)
Start the antibiotic along with prednisone. Come back in about 3 months for an annual physical, including your PAP smear.   Please keep a diary of you blood pressure.  I want to see you back in the office if you are having pressures that average greater than 140/90.  Please monitor you diet and try to get some exercise daily.   Exercise improves every system in the body.  It lowers the risk of heart disease, decreases blood pressure, reduces the symptoms of depression and anxiety, and lowers blood sugar. To receive these benefits, try to get 150 minutes of planned exercise each week.  You can break this 150 minutes up however you like.  For instance, you can perform 30 minutes of brisk walking 5 days a week, or perform 50 minutes 3 days a week.  If you don't like walking, or can't find a safe place to walk, find another way to move that you can enjoy.  Exercise tapes, cycling, stair climbing, swimming, or a combination will be just as good as a walking program. To ensure the proper intensity, you can use the talk test. Essentially, you should be able to carry on a conversation, but you should have to take short breaks from the conversation in order catch your breath.

## 2017-09-19 ENCOUNTER — Ambulatory Visit (INDEPENDENT_AMBULATORY_CARE_PROVIDER_SITE_OTHER): Payer: BLUE CROSS/BLUE SHIELD | Admitting: Psychiatry

## 2017-09-19 ENCOUNTER — Encounter (HOSPITAL_COMMUNITY): Payer: Self-pay | Admitting: Psychiatry

## 2017-09-19 VITALS — BP 122/74 | HR 70 | Ht 62.0 in | Wt 245.0 lb

## 2017-09-19 DIAGNOSIS — Z1329 Encounter for screening for other suspected endocrine disorder: Secondary | ICD-10-CM | POA: Diagnosis not present

## 2017-09-19 DIAGNOSIS — Z0000 Encounter for general adult medical examination without abnormal findings: Secondary | ICD-10-CM | POA: Diagnosis not present

## 2017-09-19 DIAGNOSIS — F419 Anxiety disorder, unspecified: Secondary | ICD-10-CM

## 2017-09-19 DIAGNOSIS — Z130 Encounter for screening for diseases of the blood and blood-forming organs and certain disorders involving the immune mechanism: Secondary | ICD-10-CM | POA: Diagnosis not present

## 2017-09-19 DIAGNOSIS — Z13220 Encounter for screening for lipoid disorders: Secondary | ICD-10-CM | POA: Diagnosis not present

## 2017-09-19 DIAGNOSIS — F330 Major depressive disorder, recurrent, mild: Secondary | ICD-10-CM | POA: Diagnosis not present

## 2017-09-19 DIAGNOSIS — F900 Attention-deficit hyperactivity disorder, predominantly inattentive type: Secondary | ICD-10-CM | POA: Diagnosis not present

## 2017-09-19 MED ORDER — LISDEXAMFETAMINE DIMESYLATE 30 MG PO CAPS: 30.0000 mg | ORAL_CAPSULE | Freq: Every day | ORAL | 0 refills | Status: DC

## 2017-09-19 MED ORDER — TRAZODONE HCL 50 MG PO TABS: 50.0000 mg | ORAL_TABLET | Freq: Every day | ORAL | 0 refills | Status: DC

## 2017-09-19 MED ORDER — SERTRALINE HCL 100 MG PO TABS: 100.0000 mg | ORAL_TABLET | Freq: Every day | ORAL | 0 refills | Status: DC

## 2017-09-19 NOTE — Progress Notes (Signed)
BH MD/PA/NP OP Progress Note  09/19/2017 8:09 AM JAKI STEPTOE  MRN:  [id number]  Chief Complaint: I am doing better.  My husband started TMS treatment.  HPI: Patient came for her follow-up appointment.  She is taking the medication but past few days she is out of her Vyvanse.  She is feeling tired and have no energy.  But she is pleased because her husband is started TMS treatment.  They also started marriage counseling with Harrie Foreman.  Patient told her husband went twice and hoping that he will continue marriage counseling.  Patient and her husband like therapist.  Overall patient feels the medicine working but lately since she has been out of Vyvanse she is feeling tired with lack of energy.  She denies any crying spells, irritability, mania, psychosis.  She is also seeing a therapist Hale Bogus regularly.  Patient like to go with the patient for the next TMS treatment.  Currently she is busy at school as she is finishing school calendar.  Patient is a Runner, broadcasting/film/video for special ed.  Patient denies tremors, shakes or any EPS.  Her sleep is good with the trazodone.  She denies any feeling of hopelessness or worthlessness.  Patient denies drinking or using any illegal substances.  Visit Diagnosis:    ICD-10-CM   1. Attention deficit hyperactivity disorder (ADHD), predominantly inattentive type F90.0 lisdexamfetamine (VYVANSE) 30 MG capsule  2. Mild episode of recurrent major depressive disorder (HCC) F33.0 sertraline (ZOLOFT) 100 MG tablet    traZODone (DESYREL) 50 MG tablet  3. Anxiety F41.9 sertraline (ZOLOFT) 100 MG tablet    Past Psychiatric History: Reviewed with Patient started taking antidepressant 8 years ago when she was in [place]. At that time her husband was going through drug addiction and she was very depressed. She had tried Celexa, Wellbutrin, Prozac and recently Strattera. She's been taking Zoloft and trazodone for quite a few years but is working very well. She had briefly seen  psychiatrist when she was [facility] but most of the time her medicines were given by her primary care physician. Patient denies any history of psychiatric inpatient treatment. Patient denies any history of mania, psychosis, hallucination or any suicidal attempt  Past Medical History:  Past Medical History:  Diagnosis Date  . Abdominal distention   . Abdominal pain   . Anxiety   . Blood in stool   . Constipation   . Diverticulitis    Per pt. States she never had CT scan to confirm this  . Diverticulosis    PT HOSPITALIZED AT [facility] October 03, 2011  . Gestational hypertension 07/05/2013  . Gestational hypertension 07/05/2013  . History of chicken pox    childhood  . History of hiatal hernia   . Hypertension    During pregnancy only  . Maternal obesity, antepartum 07/05/2013  . Maternal obesity, antepartum 07/05/2013  . Numbness    first two fingers right hand   . Rectal bleeding   . Shortness of breath dyspnea    with exercise  . Umbilical hernia    planning surgical repair    Past Surgical History:  Procedure Laterality Date  . BREATH TEK H PYLORI N/A 07/22/2014   Procedure: BREATH TEK H PYLORI;  Surgeon: Ovidio Kin, MD;  Location: [facility];  Service: General;  Laterality: N/A;  . CESAREAN SECTION  05/28/09  . CESAREAN SECTION N/A 07/05/2013   Procedure: REPEAT CESAREAN SECTION (With Special Wound Vac);  Surgeon: Robley Fries, MD;  Location: [facility];  Service: Obstetrics;  Laterality: N/A;   EDD: 07/23/13  . COLONOSCOPY  10/07/2011   Procedure: COLONOSCOPY;  Surgeon: Theda Belfast, MD;  Location: [facility];  Service: Endoscopy;  Laterality: N/A;  . ESOPHAGOGASTRODUODENOSCOPY  10/07/2011   Procedure: ESOPHAGOGASTRODUODENOSCOPY (EGD);  Surgeon: Theda Belfast, MD;  Location: [facility];  Service: Endoscopy;  Laterality: N/A;  . LAPAROSCOPIC GASTRIC SLEEVE RESECTION WITH HIATAL HERNIA REPAIR N/A 11/17/2014   Procedure: LAPAROSCOPIC GASTRIC SLEEVE RESECTION ;  Surgeon: Ovidio Kin, MD;  Location: [facility];  Service: General;  Laterality: N/A;  . SCAR REVISION  09/17/09   c-section scar revision  . UMBILICAL HERNIA REPAIR  11/01/2011   Procedure: HERNIA REPAIR UMBILICAL ADULT;  Surgeon: Velora Heckler, MD;  Location: [facility];  Service: General;  Laterality: N/A;    Family Psychiatric History: Reviewed.  Family History:  Family History  Problem Relation Age of Onset  . Diabetes Mother   . Hypertension Mother   . Ulcerative colitis Father   . Hypertension Father   . Cancer Maternal Grandmother        lung    Social History:  Social History   Socioeconomic History  . Marital status: Married    Spouse name: Not on file  . Number of children: Not on file  . Years of education: Not on file  . Highest education level: Not on file  Occupational History  . Not on file  Social Needs  . Financial resource strain: Not on file  . Food insecurity:    Worry: Not on file    Inability: Not on file  . Transportation needs:    Medical: Not on file    Non-medical: Not on file  Tobacco Use  . Smoking status: Never Smoker  . Smokeless tobacco: Never Used  Substance and Sexual Activity  . Alcohol use: No    Alcohol/week: 0.0 oz  . Drug use: No  . Sexual activity: Yes    Partners: Male    Birth control/protection: Pill  Lifestyle  . Physical activity:    Days per week: Not on file    Minutes per session: Not on file  . Stress: Not on file  Relationships  . Social connections:    Talks on phone: Not on file    Gets together: Not on file    Attends religious service: Not on file    Active member of club or organization: Not on file    Attends meetings of clubs or organizations: Not on file    Relationship status: Not on file  Other Topics Concern  . Not on file  Social History Narrative  . Not on file    Allergies: No Known Allergies  Metabolic Disorder Labs: Lab Results  Component Value Date   HGBA1C 4.6 07/07/2016   MPG 85 07/07/2016   No  results found for: PROLACTIN Lab Results  Component Value Date   CHOL 182 10/26/2011   TRIG 140 10/26/2011   HDL 47 10/26/2011   CHOLHDL 3.9 10/26/2011   VLDL 28 10/26/2011   LDLCALC 107 (H) 10/26/2011   Lab Results  Component Value Date   TSH 1.83 07/07/2016   TSH 1.723 10/02/2014    Therapeutic Level Labs: No results found for: LITHIUM No results found for: VALPROATE No components found for:  CBMZ  Current Medications: Current Outpatient Medications  Medication Sig Dispense Refill  . acetaminophen (TYLENOL) 500 MG tablet Take 500 mg by mouth every 6 (six) hours as needed for  moderate pain. Reported on 07/20/2015    . CALCIUM PO Take 1 tablet daily by mouth.    . cetirizine (ZYRTEC) 10 MG tablet Take 10 mg by mouth daily as needed for allergies. Reported on 07/20/2015    . CRYSELLE-28 0.3-30 MG-MCG tablet Take 1 tablet by mouth daily. Reported on 07/20/2015  12  . lisdexamfetamine (VYVANSE) 30 MG capsule Take 1 capsule (30 mg total) by mouth daily. 31 capsule 0  . LYSINE HCL PO Take 1 tablet daily by mouth.    . Probiotic Product (PROBIOTIC COLON SUPPORT PO) Take 1 tablet by mouth daily.    . pseudoephedrine (SUDAFED) 30 MG tablet Take 30 mg every 4 (four) hours as needed by mouth for congestion.    . sertraline (ZOLOFT) 100 MG tablet Take 1 tablet (100 mg total) by mouth at bedtime. 90 tablet 0  . traZODone (DESYREL) 50 MG tablet Take 1 tablet (50 mg total) by mouth at bedtime. 90 tablet 0   No current facility-administered medications for this visit.      Musculoskeletal: Strength & Muscle Tone: within normal limits Gait & Station: normal Patient leans: N/A  Psychiatric Specialty Exam: ROS  Blood pressure 122/74, pulse 70, height  (1.575 m), weight 245 lb (111.1 kg).Body mass index is 44.81 kg/m.  General Appearance: Casual and Obese  Eye Contact:  Good  Speech:  Clear and Coherent  Volume:  Normal  Mood:  Euthymic  Affect:  Congruent  Thought Process:  Goal  Directed  Orientation:  Full (Time, Place, and Person)  Thought Content: Logical   Suicidal Thoughts:  No  Homicidal Thoughts:  No  Memory:  Immediate;   Good Recent;   Good Remote;   Good  Judgement:  Good  Insight:  Good  Psychomotor Activity:  Normal  Concentration:  Concentration: Fair and Attention Span: Fair  Recall:  Good  Fund of Knowledge: Good  Language: Good  Akathisia:  No  Handed:  Right  AIMS (if indicated): not done  Assets:  Communication Skills Desire for Improvement Housing Resilience Social Support Talents/Skills  ADL's:  Intact  Cognition: WNL  Sleep:  Good   Screenings: PHQ2-9     Office Visit from 09/01/2017 in Primary Care at [facility] Office Visit from 03/07/2017 in Primary Care at [facility] Visit from 04/29/2016 in Primary Care at [facility] Visit from 04/23/2016 in Primary Care at [facility] Visit from 11/07/2015 in Primary Care at [facility] Total Score  0  0  0  0  0       Assessment and Plan: Major depressive disorder, recurrent.  Attention deficit disorder, inattentive type.  Anxiety disorder NOS.  Reinforced to take the medication as prescribed and take it regularly.  Encourage to continue marriage counseling with Mauri Pole.  Continue Vyvanse 30 mg daily, Zoloft 100 mg daily and trazodone 50 mg at bedtime.  Discussed medication side effects and benefits.  Discussed healthy lifestyle and watch her calorie intake and regular exercise.  Patient is scheduled to have blood work today with her OB/GYN and I recommended to have her blood work faxed to [place].  Recommended to call us back if she has any question, concern for feel worsening of the symptoms.  Follow-up in 3 months.   Cleotis Nipper, MD 09/19/2017, 8:09 AM

## 2017-10-17 ENCOUNTER — Other Ambulatory Visit (HOSPITAL_COMMUNITY): Payer: Self-pay | Admitting: Psychiatry

## 2017-10-17 ENCOUNTER — Telehealth (HOSPITAL_COMMUNITY): Payer: Self-pay

## 2017-10-17 DIAGNOSIS — F900 Attention-deficit hyperactivity disorder, predominantly inattentive type: Secondary | ICD-10-CM

## 2017-10-17 MED ORDER — LISDEXAMFETAMINE DIMESYLATE 30 MG PO CAPS: 30.0000 mg | ORAL_CAPSULE | Freq: Every day | ORAL | 0 refills | Status: DC

## 2017-10-17 NOTE — Telephone Encounter (Signed)
Patient is calling for a refill on her Vyvanse, she does not return to the office until September. Please review and advise, thank you

## 2017-11-16 ENCOUNTER — Telehealth (HOSPITAL_COMMUNITY): Payer: Self-pay

## 2017-11-16 DIAGNOSIS — F900 Attention-deficit hyperactivity disorder, predominantly inattentive type: Secondary | ICD-10-CM

## 2017-11-16 MED ORDER — LISDEXAMFETAMINE DIMESYLATE 30 MG PO CAPS: 30.0000 mg | ORAL_CAPSULE | Freq: Every day | ORAL | 0 refills | Status: DC

## 2017-11-16 NOTE — Telephone Encounter (Signed)
All set, sent a refill now

## 2017-11-16 NOTE — Telephone Encounter (Signed)
This is a patient of Dr. Lolly MustacheArfeen, she needs her [facility]vanse refill [facility] on [address]. Please review and advise, thank you

## 2017-12-11 ENCOUNTER — Ambulatory Visit (INDEPENDENT_AMBULATORY_CARE_PROVIDER_SITE_OTHER): Payer: BLUE CROSS/BLUE SHIELD | Admitting: Emergency Medicine

## 2017-12-11 ENCOUNTER — Encounter: Payer: Self-pay | Admitting: Emergency Medicine

## 2017-12-11 VITALS — BP 151/86 | HR 104 | Temp 99.5°F | Resp 16 | Ht 62.0 in | Wt 256.0 lb

## 2017-12-11 DIAGNOSIS — J020 Streptococcal pharyngitis: Secondary | ICD-10-CM | POA: Insufficient documentation

## 2017-12-11 DIAGNOSIS — J029 Acute pharyngitis, unspecified: Secondary | ICD-10-CM | POA: Diagnosis not present

## 2017-12-11 LAB — POCT RAPID STREP A (OFFICE): Rapid Strep A Screen: POSITIVE — AB

## 2017-12-11 MED ORDER — FLUCONAZOLE 150 MG PO TABS: ORAL_TABLET | ORAL | 1 refills | Status: DC

## 2017-12-11 MED ORDER — AZITHROMYCIN 250 MG PO TABS: ORAL_TABLET | ORAL | 0 refills | Status: DC

## 2017-12-11 NOTE — Patient Instructions (Addendum)
     If you have lab work done today you will be contacted with your lab results within the next 2 weeks.  If you have not heard from us then please contact us. The fastest way to get your results is to register for My Chart.   IF you received an x-ray today, you will receive an invoice from [facility]. Please contact [facility] at [phone] with questions or concerns regarding your invoice.   IF you received labwork today, you will receive an invoice from [facility]. Please contact [facility] at [phone] with questions or concerns regarding your invoice.   Our billing staff will not be able to assist you with questions regarding bills from these companies.  You will be contacted with the lab results as soon as they are available. The fastest way to get your results is to activate your My Chart account. Instructions are located on the last page of this paperwork. If you have not heard from us regarding the results in 2 weeks, please contact this office.      Strep Throat Strep throat is an infection of the throat. It is caused by germs. Strep throat spreads from person to person because of coughing, sneezing, or close contact. Follow these instructions at home: Medicines  Take over-the-counter and prescription medicines only as told by your doctor.  Take your antibiotic medicine as told by your doctor. Do not stop taking the medicine even if you feel better.  Have family members who also have a sore throat or fever go to a doctor. Eating and drinking  Do not share food, drinking cups, or personal items.  Try eating soft foods until your sore throat feels better.  Drink enough fluid to keep your pee (urine) clear or pale yellow. General instructions  Rinse your mouth (gargle) with a salt-water mixture 3-4 times per day or as needed. To make a salt-water mixture, stir -1 tsp of salt into 1 cup of warm water.  Make sure that all people in your house wash  their hands well.  Rest.  Stay home from school or work until you have been taking antibiotics for 24 hours.  Keep all follow-up visits as told by your doctor. This is important. Contact a doctor if:  Your neck keeps getting bigger.  You get a rash, cough, or earache.  You cough up thick liquid that is green, yellow-brown, or bloody.  You have pain that does not get better with medicine.  Your problems get worse instead of getting better.  You have a fever. Get help right away if:  You throw up (vomit).  You get a very bad headache.  You neck hurts or it feels stiff.  You have chest pain or you are short of breath.  You have drooling, very bad throat pain, or changes in your voice.  Your neck is swollen or the skin gets red and tender.  Your mouth is dry or you are peeing less than normal.  You keep feeling more tired or it is hard to wake up.  Your joints are red or they hurt. This information is not intended to replace advice given to you by your health care provider. Make sure you discuss any questions you have with your health care provider. Document Released: 09/28/2007 Document Revised: 12/09/2015 Document Reviewed: 08/04/2014 Elsevier Interactive Patient Education  [employer]2018 [employer]

## 2017-12-11 NOTE — Progress Notes (Signed)
Ann Santos 37 y.o.   Chief Complaint  Patient presents with  . Sore Throat    x 4 days/ hurts to swallow  . Headache    x 4 days    HISTORY OF PRESENT ILLNESS: This is a 37 y.o. female complaining of sore throat for 4 days with difficulty swallowing and headache.  Denies fever.  Eating and drinking okay but painful.  Denies nausea or vomiting.  Denies rash.  No other significant symptoms.  Sore Throat   This is a new problem. The current episode started in the past 7 days. The problem has been gradually worsening. The pain is worse on the left side. There has been no fever. The pain is at a severity of 7/10. The pain is moderate. Associated symptoms include ear pain, headaches and swollen glands. Pertinent negatives include no abdominal pain, congestion, coughing, diarrhea, ear discharge, neck pain, shortness of breath, trouble swallowing or vomiting. She has tried gargles and NSAIDs for the symptoms. The treatment provided mild relief.     Prior to Admission medications   Medication Sig Start Date End Date Taking? Authorizing Provider  acetaminophen (TYLENOL) 500 MG tablet Take 500 mg by mouth every 6 (six) hours as needed for moderate pain. Reported on 07/20/2015   Yes [provider]  CALCIUM PO Take 1 tablet daily by mouth.   Yes [provider]  cetirizine (ZYRTEC) 10 MG tablet Take 10 mg by mouth daily as needed for allergies. Reported on 07/20/2015   Yes [provider]  CRYSELLE-28 0.3-30 MG-MCG tablet Take 1 tablet by mouth daily. Reported on 07/20/2015 10/30/14  Yes [provider]  lisdexamfetamine (VYVANSE) 30 MG capsule Take 1 capsule (30 mg total) by mouth daily. 11/16/17  Yes Eksir, Bo Mcclintock, MD  LYSINE HCL PO Take 1 tablet daily by mouth.   Yes [provider]  Probiotic Product (PROBIOTIC COLON SUPPORT PO) Take 1 tablet by mouth daily.   Yes [provider]  pseudoephedrine (SUDAFED) 30 MG tablet Take 30 mg every  4 (four) hours as needed by mouth for congestion.   Yes [provider]  sertraline (ZOLOFT) 100 MG tablet Take 1 tablet (100 mg total) by mouth at bedtime. 09/19/17  Yes Arfeen, Phillips Grout, MD  traZODone (DESYREL) 50 MG tablet Take 1 tablet (50 mg total) by mouth at bedtime. 09/19/17  Yes Arfeen, Phillips Grout, MD    No Known Allergies  Patient Active Problem List   Diagnosis Date Noted  . Morbid obesity (HCC) 11/17/2014  . Atypical chest pain 07/17/2013  . Diverticular disease 10/23/2012  . Umbilical hernia 10/26/2011  . Anxiety 07/26/2011    Past Medical History:  Diagnosis Date  . Abdominal distention   . Abdominal pain   . Anxiety   . Blood in stool   . Constipation   . Diverticulitis    Per pt. States she never had CT scan to confirm this  . Diverticulosis    PT H[facility]PITALIZED AT [facility] October 03, 2011  . Gestational hypertension 07/05/2013  . Gestational hypertension 07/05/2013  . History of chicken pox    childhood  . History of hiatal hernia   . Hypertension    During pregnancy only  . Maternal obesity, antepartum 07/05/2013  . Maternal obesity, antepartum 07/05/2013  . Numbness    first two fingers right hand   . Rectal bleeding   . Shortness of breath dyspnea    with exercise  . Umbilical hernia  planning surgical repair    Past Surgical History:  Procedure Laterality Date  . BREATH TEK H PYLORI N/A 07/22/2014   Procedure: BREATH TEK H PYLORI;  Surgeon: Ovidio Kin, MD;  Location: [facility];  Service: General;  Laterality: N/A;  . CESAREAN SECTION  05/28/09  . CESAREAN SECTION N/A 07/05/2013   Procedure: REPEAT CESAREAN SECTION (With Special Wound Vac);  Surgeon: Robley Fries, MD;  Location: [facility];  Service: Obstetrics;  Laterality: N/A;   EDD: 07/23/13  . COLONOSCOPY  10/07/2011   Procedure: COLONOSCOPY;  Surgeon: Theda Belfast, MD;  Location: [facility];  Service: Endoscopy;  Laterality: N/A;  . ESOPHAGOGASTRODUODENOSCOPY  10/07/2011   Procedure:  ESOPHAGOGASTRODUODENOSCOPY (EGD);  Surgeon: Theda Belfast, MD;  Location: [facility];  Service: Endoscopy;  Laterality: N/A;  . LAPAROSCOPIC GASTRIC SLEEVE RESECTION WITH HIATAL HERNIA REPAIR N/A 11/17/2014   Procedure: LAPAROSCOPIC GASTRIC SLEEVE RESECTION ;  Surgeon: Ovidio Kin, MD;  Location: [facility];  Service: General;  Laterality: N/A;  . SCAR REVISION  09/17/09   c-section scar revision  . UMBILICAL HERNIA REPAIR  11/01/2011   Procedure: HERNIA REPAIR UMBILICAL ADULT;  Surgeon: Velora Heckler, MD;  Location: [facility];  Service: General;  Laterality: N/A;    Social History   Socioeconomic History  . Marital status: Married    Spouse name: Not on file  . Number of children: Not on file  . Years of education: Not on file  . Highest education level: Not on file  Occupational History  . Not on file  Social Needs  . Financial resource strain: Not on file  . Food insecurity:    Worry: Not on file    Inability: Not on file  . Transportation needs:    Medical: Not on file    Non-medical: Not on file  Tobacco Use  . Smoking status: Never Smoker  . Smokeless tobacco: Never Used  Substance and Sexual Activity  . Alcohol use: No    Alcohol/week: 0.0 standard drinks  . Drug use: No  . Sexual activity: Yes    Partners: Male    Birth control/protection: Pill  Lifestyle  . Physical activity:    Days per week: Not on file    Minutes per session: Not on file  . Stress: Not on file  Relationships  . Social connections:    Talks on phone: Not on file    Gets together: Not on file    Attends religious service: Not on file    Active member of club or organization: Not on file    Attends meetings of clubs or organizations: Not on file    Relationship status: Not on file  . Intimate partner violence:    Fear of current or ex partner: Not on file    Emotionally abused: Not on file    Physically abused: Not on file    Forced sexual activity: Not on file  Other Topics Concern  . Not on  file  Social History Narrative  . Not on file    Family History  Problem Relation Age of Onset  . Diabetes Mother   . Hypertension Mother   . Ulcerative colitis Father   . Hypertension Father   . Cancer Maternal Grandmother        lung     Review of Systems  Constitutional: Negative.  Negative for fever.  HENT: Positive for ear pain and sore throat. Negative for congestion, ear discharge and trouble swallowing.  Eyes: Negative.  Negative for discharge and redness.  Respiratory: Negative.  Negative for cough, hemoptysis and shortness of breath.   Cardiovascular: Negative.  Negative for chest pain and palpitations.  Gastrointestinal: Negative.  Negative for abdominal pain, blood in stool, diarrhea, melena, nausea and vomiting.  Genitourinary: Negative.  Negative for dysuria and hematuria.  Musculoskeletal: Negative.  Negative for back pain, joint pain, myalgias and neck pain.  Skin: Negative.  Negative for rash.  Neurological: Positive for headaches. Negative for dizziness, sensory change, focal weakness and weakness.  Endo/Heme/Allergies: Negative.   All other systems reviewed and are negative.   Vitals:   12/11/17 1138  BP: (!) 151/86  Pulse: (!) 104  Resp: 16  Temp: 99.5 F (37.5 C)  SpO2: 98%    Physical Exam  Constitutional: She is oriented to person, place, and time. She appears well-developed and well-nourished.  HENT:  Head: Normocephalic and atraumatic.  Right Ear: Tympanic membrane and ear canal normal.  Left Ear: Tympanic membrane and ear canal normal.  Mouth/Throat: Uvula is midline and mucous membranes are normal. No uvula swelling. Oropharyngeal exudate, posterior oropharyngeal edema and posterior oropharyngeal erythema present. No tonsillar abscesses. Tonsillar exudate.  Eyes: Pupils are equal, round, and reactive to light. EOM are normal.  Neck: Normal range of motion. Neck supple.  Cardiovascular: Normal rate and normal heart sounds.  No murmur  heard. Pulmonary/Chest: Effort normal and breath sounds normal.  Musculoskeletal: Normal range of motion.  Lymphadenopathy:    She has cervical adenopathy.  Neurological: She is alert and oriented to person, place, and time.  Skin: Skin is warm and dry. Capillary refill takes less than 2 seconds. No rash noted.  Psychiatric: She has a normal mood and affect. Her behavior is normal.  Vitals reviewed.   Results for orders placed or performed in visit on 12/11/17 (from the past 24 hour(s))  POCT rapid strep A     Status: Abnormal   Collection Time: 12/11/17 11:58 AM  Result Value Ref Range   Rapid Strep A Screen Positive (A) Negative    ASSESSMENT & PLAN: Esma was seen today for sore throat and headache.  Diagnoses and all orders for this visit:  Streptococcal pharyngitis -     azithromycin (ZITHROMAX) 250 MG tablet; Sig as indicated -     fluconazole (DIFLUCAN) 150 MG tablet; Sig one (1) tablet daily for 3 days.  Sore throat -     POCT rapid strep A    Patient Instructions       If you have lab work done today you will be contacted with your lab results within the next 2 weeks.  If you have not heard from us then please contact us. The fastest way to get your results is to register for My Chart.   IF you received an x-ray today, you will receive an invoice from [facility]. Please contact [facility] at [phone] with questions or concerns regarding your invoice.   IF you received labwork today, you will receive an invoice from [facility]. Please contact [facility] at [phone] with questions or concerns regarding your invoice.   Our billing staff will not be able to assist you with questions regarding bills from these companies.  You will be contacted with the lab results as soon as they are available. The fastest way to get your results is to activate your My Chart account. Instructions are located on the last page of this paperwork. If you have not  heard from us regarding  the results in 2 weeks, please contact this office.      Strep Throat Strep throat is an infection of the throat. It is caused by germs. Strep throat spreads from person to person because of coughing, sneezing, or close contact. Follow these instructions at home: Medicines  Take over-the-counter and prescription medicines only as told by your doctor.  Take your antibiotic medicine as told by your doctor. Do not stop taking the medicine even if you feel better.  Have family members who also have a sore throat or fever go to a doctor. Eating and drinking  Do not share food, drinking cups, or personal items.  Try eating soft foods until your sore throat feels better.  Drink enough fluid to keep your pee (urine) clear or pale yellow. General instructions  Rinse your mouth (gargle) with a salt-water mixture 3-4 times per day or as needed. To make a salt-water mixture, stir -1 tsp of salt into 1 cup of warm water.  Make sure that all people in your house wash their hands well.  Rest.  Stay home from school or work until you have been taking antibiotics for 24 hours.  Keep all follow-up visits as told by your doctor. This is important. Contact a doctor if:  Your neck keeps getting bigger.  You get a rash, cough, or earache.  You cough up thick liquid that is green, yellow-brown, or bloody.  You have pain that does not get better with medicine.  Your problems get worse instead of getting better.  You have a fever. Get help right away if:  You throw up (vomit).  You get a very bad headache.  You neck hurts or it feels stiff.  You have chest pain or you are short of breath.  You have drooling, very bad throat pain, or changes in your voice.  Your neck is swollen or the skin gets red and tender.  Your mouth is dry or you are peeing less than normal.  You keep feeling more tired or it is hard to wake up.  Your joints are red or they  hurt. This information is not intended to replace advice given to you by your health care provider. Make sure you discuss any questions you have with your health care provider. Document Released: 09/28/2007 Document Revised: 12/09/2015 Document Reviewed: 08/04/2014 Elsevier Interactive Patient Education  2018 [employer]      Edwina BarthMiguel Kedar Sedano, MD [facility]

## 2017-12-13 ENCOUNTER — Encounter (HOSPITAL_COMMUNITY): Payer: Self-pay | Admitting: *Deleted

## 2017-12-13 ENCOUNTER — Emergency Department (HOSPITAL_COMMUNITY)
Admission: EM | Admit: 2017-12-13 | Discharge: 2017-12-13 | Disposition: A | Discharge status: Home / Self Care | Payer: BLUE CROSS/BLUE SHIELD | Attending: Emergency Medicine | Admitting: Emergency Medicine

## 2017-12-13 ENCOUNTER — Other Ambulatory Visit: Payer: Self-pay

## 2017-12-13 DIAGNOSIS — Z79899 Other long term (current) drug therapy: Secondary | ICD-10-CM | POA: Diagnosis not present

## 2017-12-13 DIAGNOSIS — J029 Acute pharyngitis, unspecified: Secondary | ICD-10-CM | POA: Insufficient documentation

## 2017-12-13 MED ORDER — PENICILLIN G BENZATHINE 1200000 UNIT/2ML IM SUSP
1.2000 10*6.[IU] | Freq: Once | INTRAMUSCULAR | Status: AC
  Administered 2017-12-13: 1.2 10*6.[IU] via INTRAMUSCULAR
  Filled 2017-12-13: qty 2

## 2017-12-13 MED ORDER — SODIUM CHLORIDE 0.9 % IV BOLUS
1000.0000 mL | Freq: Once | INTRAVENOUS | Status: AC
  Administered 2017-12-13: 1000 mL via INTRAVENOUS

## 2017-12-13 MED ORDER — AMOXICILLIN 500 MG PO CAPS
500.0000 mg | ORAL_CAPSULE | Freq: Once | ORAL | Status: AC
  Administered 2017-12-13: 500 mg via ORAL
  Filled 2017-12-13: qty 1

## 2017-12-13 MED ORDER — PROMETHAZINE HCL 25 MG/ML IJ SOLN
25.0000 mg | Freq: Once | INTRAMUSCULAR | Status: AC
  Administered 2017-12-13: 25 mg via INTRAVENOUS
  Filled 2017-12-13: qty 1

## 2017-12-13 MED ORDER — AMOXICILLIN 500 MG PO CAPS: 500.0000 mg | ORAL_CAPSULE | Freq: Three times a day (TID) | ORAL | 0 refills | Status: DC

## 2017-12-13 MED ORDER — PROMETHAZINE HCL 25 MG PO TABS: 25.0000 mg | ORAL_TABLET | Freq: Four times a day (QID) | ORAL | 0 refills | Status: DC | PRN

## 2017-12-13 MED ORDER — MORPHINE SULFATE (PF) 4 MG/ML IV SOLN
4.0000 mg | Freq: Once | INTRAVENOUS | Status: AC
  Administered 2017-12-13: 4 mg via INTRAVENOUS
  Filled 2017-12-13: qty 1

## 2017-12-13 MED ORDER — ONDANSETRON HCL 4 MG/2ML IJ SOLN
4.0000 mg | Freq: Once | INTRAMUSCULAR | Status: AC
  Administered 2017-12-13: 4 mg via INTRAVENOUS
  Filled 2017-12-13: qty 2

## 2017-12-13 MED ORDER — HYDROCODONE-ACETAMINOPHEN 7.5-325 MG/15ML PO SOLN: 10.0000 mL | Freq: Four times a day (QID) | ORAL | 0 refills | Status: DC | PRN

## 2017-12-13 MED ORDER — DEXAMETHASONE SODIUM PHOSPHATE 10 MG/ML IJ SOLN
10.0000 mg | Freq: Once | INTRAMUSCULAR | Status: AC
  Administered 2017-12-13: 10 mg via INTRAVENOUS
  Filled 2017-12-13: qty 1

## 2017-12-13 NOTE — ED Triage Notes (Signed)
Pt arrives ambulatory to triage with c/o sore throat. She reports she was strep positive on Monday. She says she has not slept in 3 days because of the pain, has severe pain on the left side, and has been vomiting all day today. She has tried zofran without relief.

## 2017-12-13 NOTE — ED Provider Notes (Signed)
[facility]-EMERGENCY DEPT Provider Note   CSN: [id number] Arrival date & time: 12/13/17  0201     History   Chief Complaint Chief Complaint  Patient presents with  . Sore Throat    HPI Ann Santos is a 37 y.o. female.  Patient presents to the emergency department with a chief complaint of sore throat.  She states that she was diagnosed with strep throat a few days ago by her PCP.  She was prescribed azithromycin.  She reports no improvement of her symptoms.  She states that she now is having vomiting.  She describes the pain as intolerable.  She denies fever.  Denies any other associated symptoms.  Symptoms are aggravated with swallowing.  Nothing makes her symptoms better.  The history is provided by the patient. No language interpreter was used.    Past Medical History:  Diagnosis Date  . Abdominal distention   . Abdominal pain   . Anxiety   . Blood in stool   . Constipation   . Diverticulitis    Per pt. States she never had CT scan to confirm this  . Diverticulosis    PT HOSPITALIZED AT [facility] October 03, 2011  . Gestational hypertension 07/05/2013  . Gestational hypertension 07/05/2013  . History of chicken pox    childhood  . History of hiatal hernia   . Hypertension    During pregnancy only  . Maternal obesity, antepartum 07/05/2013  . Maternal obesity, antepartum 07/05/2013  . Numbness    first two fingers right hand   . Rectal bleeding   . Shortness of breath dyspnea    with exercise  . Umbilical hernia    planning surgical repair    Patient Active Problem List   Diagnosis Date Noted  . Sore throat 12/11/2017  . Streptococcal pharyngitis 12/11/2017  . Morbid obesity (HCC) 11/17/2014  . Atypical chest pain 07/17/2013  . Diverticular disease 10/23/2012  . Umbilical hernia 10/26/2011  . Anxiety 07/26/2011    Past Surgical History:  Procedure Laterality Date  . BREATH TEK H PYLORI N/A 07/22/2014   Procedure: BREATH TEK H PYLORI;   Surgeon: Ovidio Kin, MD;  Location: [facility];  Service: General;  Laterality: N/A;  . CESAREAN SECTION  05/28/09  . CESAREAN SECTION N/A 07/05/2013   Procedure: REPEAT CESAREAN SECTION (With Special Wound Vac);  Surgeon: Robley Fries, MD;  Location: [facility];  Service: Obstetrics;  Laterality: N/A;   EDD: 07/23/13  . COLONOSCOPY  10/07/2011   Procedure: COLONOSCOPY;  Surgeon: Theda Belfast, MD;  Location: [facility];  Service: Endoscopy;  Laterality: N/A;  . ESOPHAGOGASTRODUODENOSCOPY  10/07/2011   Procedure: ESOPHAGOGASTRODUODENOSCOPY (EGD);  Surgeon: Theda Belfast, MD;  Location: [facility];  Service: Endoscopy;  Laterality: N/A;  . LAPAROSCOPIC GASTRIC SLEEVE RESECTION WITH HIATAL HERNIA REPAIR N/A 11/17/2014   Procedure: LAPAROSCOPIC GASTRIC SLEEVE RESECTION ;  Surgeon: Ovidio Kin, MD;  Location: [facility];  Service: General;  Laterality: N/A;  . SCAR REVISION  09/17/09   c-section scar revision  . UMBILICAL HERNIA REPAIR  11/01/2011   Procedure: HERNIA REPAIR UMBILICAL ADULT;  Surgeon: Velora Heckler, MD;  Location: [facility];  Service: General;  Laterality: N/A;     OB History    Gravida  2   Para  2   Term  2   Preterm      AB      Living  2     SAB  TAB      Ectopic      Multiple      Live Births  2            Home Medications    Prior to Admission medications   Medication Sig Start Date End Date Taking? Authorizing Provider  acetaminophen (TYLENOL) 500 MG tablet Take 500 mg by mouth every 6 (six) hours as needed for moderate pain. Reported on 07/20/2015    [provider]  azithromycin (ZITHROMAX) 250 MG tablet Sig as indicated 12/11/17   Georgina QuintSagardia, Miguel Jose, MD  CALCIUM PO Take 1 tablet daily by mouth.    [provider]  cetirizine (ZYRTEC) 10 MG tablet Take 10 mg by mouth daily as needed for allergies. Reported on 07/20/2015    [provider]  CRYSELLE-28 0.3-30 MG-MCG tablet Take 1 tablet by mouth daily. Reported on  07/20/2015 10/30/14   [provider]  fluconazole (DIFLUCAN) 150 MG tablet Sig one (1) tablet daily for 3 days. 12/11/17   Georgina QuintSagardia, Miguel Jose, MD  lisdexamfetamine (VYVANSE) 30 MG capsule Take 1 capsule (30 mg total) by mouth daily. 11/16/17   Eksir, Bo McclintockAlexander Arya, MD  LYSINE HCL PO Take 1 tablet daily by mouth.    [provider]  Probiotic Product (PROBIOTIC COLON SUPPORT PO) Take 1 tablet by mouth daily.    [provider]  pseudoephedrine (SUDAFED) 30 MG tablet Take 30 mg every 4 (four) hours as needed by mouth for congestion.    [provider]  sertraline (ZOLOFT) 100 MG tablet Take 1 tablet (100 mg total) by mouth at bedtime. 09/19/17   Arfeen, Phillips GroutSyed T, MD  traZODone (DESYREL) 50 MG tablet Take 1 tablet (50 mg total) by mouth at bedtime. 09/19/17   Arfeen, Phillips GroutSyed T, MD    Family History Family History  Problem Relation Age of Onset  . Diabetes Mother   . Hypertension Mother   . Ulcerative colitis Father   . Hypertension Father   . Cancer Maternal Grandmother        lung    Social History Social History   Tobacco Use  . Smoking status: Never Smoker  . Smokeless tobacco: Never Used  Substance Use Topics  . Alcohol use: No    Alcohol/week: 0.0 standard drinks  . Drug use: No     Allergies   Patient has no known allergies.   Review of Systems Review of Systems  All other systems reviewed and are negative.    Physical Exam Updated Vital Signs BP (!) 143/104 (BP Location: Right Arm) Comment: Will notifiy RN  Pulse 89   Temp 98.5 F (36.9 C) (Oral)   Resp 18   Ht 5\' 2"  (1.575 m)   Wt 116.1 kg   LMP 11/26/2017   SpO2 100%   BMI 46.82 kg/m   Physical Exam  Constitutional: She is oriented to person, place, and time. She appears well-developed and well-nourished.  HENT:  Head: Normocephalic and atraumatic.  Oropharynx is erythematous with tonsillar exudate, no abscess, uvula is midline, airway intact, normal phonation, no  stridor  Eyes: Pupils are equal, round, and reactive to light. Conjunctivae and EOM are normal.  Neck: Normal range of motion. Neck supple.  Cardiovascular: Normal rate and regular rhythm. Exam reveals no gallop and no friction rub.  No murmur heard. Pulmonary/Chest: Effort normal and breath sounds normal. No respiratory distress. She has no wheezes. She has no rales. She exhibits no tenderness.  Abdominal: Soft. Bowel sounds  are normal. She exhibits no distension and no mass. There is no tenderness. There is no rebound and no guarding.  Musculoskeletal: Normal range of motion. She exhibits no edema or tenderness.  Neurological: She is alert and oriented to person, place, and time.  Skin: Skin is warm and dry.  Psychiatric: She has a normal mood and affect. Her behavior is normal. Judgment and thought content normal.  Nursing note and vitals reviewed.    ED Treatments / Results  Labs (all labs ordered are listed, but only abnormal results are displayed) Labs Reviewed - No data to display  EKG None  Radiology No results found.  Procedures Procedures (including critical care time)  Medications Ordered in ED Medications  dexamethasone (DECADRON) injection 10 mg (has no administration in time range)  morphine 4 MG/ML injection 4 mg (has no administration in time range)  ondansetron (ZOFRAN) injection 4 mg (has no administration in time range)  sodium chloride 0.9 % bolus 1,000 mL (has no administration in time range)  amoxicillin (AMOXIL) capsule 500 mg (has no administration in time range)     Initial Impression / Assessment and Plan / ED Course  I have reviewed the triage vital signs and the nursing notes.  Pertinent labs & imaging results that were available during my care of the patient were reviewed by me and considered in my medical decision making (see chart for details).     Patient with pharyngitis.  Reportedly strep pharyngitis diagnosed by PCP a few days ago.  She  was put on azithromycin.  We will switch her to amoxicillin.  We will give her some Decadron and fluids here.  Patient feels significantly improved after fluids and Phenergan.  Will discharge home with amoxicillin, Phenergan, and some pain medicine.  Review of the [place] narcotic database shows no opiates prescribed in 2019.  Final Clinical Impressions(s) / ED Diagnoses   Final diagnoses:  Pharyngitis, unspecified etiology    ED Discharge Orders         Ordered    promethazine (PHENERGAN) 25 MG tablet  Every 6 hours PRN     12/13/17 0520    amoxicillin (AMOXIL) 500 MG capsule  3 times daily     12/13/17 0520    HYDROcodone-acetaminophen (HYCET) 7.5-325 mg/15 ml solution  Every 6 hours PRN     12/13/17 0520           Roxy HorsemanBrowning, Corinthian Mizrahi, PA-C 12/13/17 0601    Eudelia Bunchardama, Amadeo GarnetPedro Eduardo, MD 12/13/17 [phone]

## 2017-12-17 ENCOUNTER — Other Ambulatory Visit (HOSPITAL_COMMUNITY): Payer: Self-pay | Admitting: Psychiatry

## 2017-12-17 DIAGNOSIS — F330 Major depressive disorder, recurrent, mild: Secondary | ICD-10-CM

## 2017-12-17 DIAGNOSIS — F419 Anxiety disorder, unspecified: Secondary | ICD-10-CM

## 2017-12-18 ENCOUNTER — Telehealth (HOSPITAL_COMMUNITY): Payer: Self-pay

## 2017-12-18 ENCOUNTER — Other Ambulatory Visit (HOSPITAL_COMMUNITY): Payer: Self-pay

## 2017-12-18 DIAGNOSIS — F419 Anxiety disorder, unspecified: Secondary | ICD-10-CM

## 2017-12-18 DIAGNOSIS — F330 Major depressive disorder, recurrent, mild: Secondary | ICD-10-CM

## 2017-12-18 DIAGNOSIS — F900 Attention-deficit hyperactivity disorder, predominantly inattentive type: Secondary | ICD-10-CM

## 2017-12-18 MED ORDER — TRAZODONE HCL 50 MG PO TABS: 50.0000 mg | ORAL_TABLET | Freq: Every day | ORAL | 0 refills | Status: DC

## 2017-12-18 MED ORDER — SERTRALINE HCL 100 MG PO TABS: 100.0000 mg | ORAL_TABLET | Freq: Every day | ORAL | 0 refills | Status: DC

## 2017-12-18 NOTE — Telephone Encounter (Signed)
Arfeen patient:   Patient is calling for a refill on her Vyvanse. She uses [facility] on [address]

## 2017-12-19 MED ORDER — LISDEXAMFETAMINE DIMESYLATE 30 MG PO CAPS: 30.0000 mg | ORAL_CAPSULE | Freq: Every day | ORAL | 0 refills | Status: DC

## 2017-12-19 NOTE — Telephone Encounter (Signed)
All done

## 2017-12-26 ENCOUNTER — Ambulatory Visit (HOSPITAL_COMMUNITY): Payer: Self-pay | Admitting: Psychiatry

## 2017-12-28 DIAGNOSIS — F4323 Adjustment disorder with mixed anxiety and depressed mood: Secondary | ICD-10-CM | POA: Diagnosis not present

## 2018-01-16 ENCOUNTER — Other Ambulatory Visit (HOSPITAL_COMMUNITY): Payer: Self-pay

## 2018-01-16 ENCOUNTER — Telehealth (HOSPITAL_COMMUNITY): Payer: Self-pay

## 2018-01-16 DIAGNOSIS — F900 Attention-deficit hyperactivity disorder, predominantly inattentive type: Secondary | ICD-10-CM

## 2018-01-16 MED ORDER — LISDEXAMFETAMINE DIMESYLATE 30 MG PO CAPS: 30.0000 mg | ORAL_CAPSULE | Freq: Every day | ORAL | 0 refills | Status: DC

## 2018-01-16 NOTE — Telephone Encounter (Signed)
Patient called for refill on Vyvanse, Trazodone, Zoloft. Only med that refill is needed is Vyvanse. [facility] [address].

## 2018-01-22 ENCOUNTER — Other Ambulatory Visit (HOSPITAL_COMMUNITY): Payer: Self-pay

## 2018-01-22 DIAGNOSIS — F900 Attention-deficit hyperactivity disorder, predominantly inattentive type: Secondary | ICD-10-CM

## 2018-01-22 MED ORDER — LISDEXAMFETAMINE DIMESYLATE 30 MG PO CAPS: 30.0000 mg | ORAL_CAPSULE | Freq: Every day | ORAL | 0 refills | Status: DC

## 2018-01-31 ENCOUNTER — Encounter: Payer: Self-pay | Admitting: Family Medicine

## 2018-01-31 ENCOUNTER — Ambulatory Visit (INDEPENDENT_AMBULATORY_CARE_PROVIDER_SITE_OTHER): Payer: BLUE CROSS/BLUE SHIELD | Admitting: Family Medicine

## 2018-01-31 ENCOUNTER — Other Ambulatory Visit: Payer: Self-pay

## 2018-01-31 VITALS — BP 113/80 | HR 88 | Temp 98.8°F | Ht 62.75 in | Wt 255.0 lb

## 2018-01-31 DIAGNOSIS — R5383 Other fatigue: Secondary | ICD-10-CM | POA: Diagnosis not present

## 2018-01-31 DIAGNOSIS — N926 Irregular menstruation, unspecified: Secondary | ICD-10-CM

## 2018-01-31 DIAGNOSIS — J029 Acute pharyngitis, unspecified: Secondary | ICD-10-CM

## 2018-01-31 LAB — POCT RAPID STREP A (OFFICE): Rapid Strep A Screen: NEGATIVE

## 2018-01-31 MED ORDER — PREDNISONE 20 MG PO TABS: 40.0000 mg | ORAL_TABLET | Freq: Every day | ORAL | 0 refills | Status: DC

## 2018-01-31 NOTE — Progress Notes (Signed)
10/9/201910:53 AM  Hurley R Porta December 15, 1980, 37 y.o. female [id number]  Chief Complaint  Patient presents with  . Sore Throat    sore throat with pain with swallowing. Has been feeling this way since the August visit. Went to ER for the issue, was the told the medication given here was not strong enough for condition    HPI:   Patient is a 37 y.o. female who presents today for sore throat  Seen on 12/11/17, + rapid strep, treated with azithromycin Seen in ER on 12/13/17, treated with pen G IM, decadron, amox 500mg  TID PO outpatient  Since then she has felt very fatigued, not doing zumba, has no energy Her period was late for about 3 weeks, neg pregnancy tests at home   Last night sore throat started, unable to speak, swallow, drink Harder to open her mouth  No fever or chills No cough, nasal congestion, runny nose, ear pain  Her son has URI, he is in preschool  Takes zyrtec daily  Fall Risk  01/31/2018 12/11/2017 09/01/2017 03/07/2017 04/29/2016  Falls in the past year? No No No No No     Depression screen [facility] 2/9 01/31/2018 12/11/2017 09/01/2017  Decreased Interest 0 0 0  Down, Depressed, Hopeless 0 0 0  PHQ - 2 Score 0 0 0    No Known Allergies  Prior to Admission medications   Medication Sig Start Date End Date Taking? Authorizing Provider  acetaminophen (TYLENOL) 500 MG tablet Take 500 mg by mouth every 6 (six) hours as needed for moderate pain. Reported on 07/20/2015   Yes [provider]  CALCIUM PO Take 1 tablet daily by mouth.   Yes [provider]  cetirizine (ZYRTEC) 10 MG tablet Take 10 mg by mouth daily as needed for allergies. Reported on 07/20/2015   Yes [provider]  CRYSELLE-28 0.3-30 MG-MCG tablet Take 1 tablet by mouth daily. Reported on 07/20/2015 10/30/14  Yes [provider]  HYDROcodone-acetaminophen (HYCET) 7.5-325 mg/15 ml solution Take 10 mLs by mouth every 6 (six) hours as needed for moderate pain. 12/13/17  Yes  Roxy Horseman, PA-C  lisdexamfetamine (VYVANSE) 30 MG capsule Take 1 capsule (30 mg total) by mouth daily. 01/22/18  Yes Arfeen, Phillips Grout, MD  LYSINE HCL PO Take 1 tablet daily by mouth.   Yes [provider]  Probiotic Product (PROBIOTIC COLON SUPPORT PO) Take 1 tablet by mouth daily.   Yes [provider]  pseudoephedrine (SUDAFED) 30 MG tablet Take 30 mg every 4 (four) hours as needed by mouth for congestion.   Yes [provider]  sertraline (ZOLOFT) 100 MG tablet Take 1 tablet (100 mg total) by mouth at bedtime. 12/18/17  Yes Arfeen, Phillips Grout, MD  traZODone (DESYREL) 50 MG tablet Take 1 tablet (50 mg total) by mouth at bedtime. 12/18/17  Yes Arfeen, Phillips Grout, MD    Past Medical History:  Diagnosis Date  . Abdominal distention   . Abdominal pain   . Anxiety   . Blood in stool   . Constipation   . Diverticulitis    Per pt. States she never had CT scan to confirm this  . Diverticulosis    PT [facility]PITALIZED AT [facility] October 03, 2011  . Gestational hypertension 07/05/2013  . Gestational hypertension 07/05/2013  . History of chicken pox    childhood  . History of hiatal hernia   . Hypertension    During pregnancy only  . Maternal obesity, antepartum 07/05/2013  .  Maternal obesity, antepartum 07/05/2013  . Numbness    first two fingers right hand   . Rectal bleeding   . Shortness of breath dyspnea    with exercise  . Umbilical hernia    planning surgical repair    Past Surgical History:  Procedure Laterality Date  . BREATH TEK H PYLORI N/A 07/22/2014   Procedure: BREATH TEK H PYLORI;  Surgeon: Ovidio Kin, MD;  Location: [facility];  Service: General;  Laterality: N/A;  . CESAREAN SECTION  05/28/09  . CESAREAN SECTION N/A 07/05/2013   Procedure: REPEAT CESAREAN SECTION (With Special Wound Vac);  Surgeon: Robley Fries, MD;  Location: [facility];  Service: Obstetrics;  Laterality: N/A;   EDD: 07/23/13  . COLONOSCOPY  10/07/2011   Procedure: COLONOSCOPY;  Surgeon:  Theda Belfast, MD;  Location: [facility];  Service: Endoscopy;  Laterality: N/A;  . ESOPHAGOGASTRODUODENOSCOPY  10/07/2011   Procedure: ESOPHAGOGASTRODUODENOSCOPY (EGD);  Surgeon: Theda Belfast, MD;  Location: [facility];  Service: Endoscopy;  Laterality: N/A;  . LAPAROSCOPIC GASTRIC SLEEVE RESECTION WITH HIATAL HERNIA REPAIR N/A 11/17/2014   Procedure: LAPAROSCOPIC GASTRIC SLEEVE RESECTION ;  Surgeon: Ovidio Kin, MD;  Location: [facility];  Service: General;  Laterality: N/A;  . SCAR REVISION  09/17/09   c-section scar revision  . UMBILICAL HERNIA REPAIR  11/01/2011   Procedure: HERNIA REPAIR UMBILICAL ADULT;  Surgeon: Velora Heckler, MD;  Location: [facility];  Service: General;  Laterality: N/A;    Social History   Tobacco Use  . Smoking status: Never Smoker  . Smokeless tobacco: Never Used  Substance Use Topics  . Alcohol use: No    Alcohol/week: 0.0 standard drinks    Family History  Problem Relation Age of Onset  . Diabetes Mother   . Hypertension Mother   . Ulcerative colitis Father   . Hypertension Father   . Cancer Maternal Grandmother        lung    ROS Per hpi  OBJECTIVE:  Blood pressure 113/80, pulse 88, temperature 98.8 F (37.1 C), temperature source Oral, height 5' 2.75" (1.594 m), weight 255 lb (115.7 kg), SpO2 98 %. Body mass index is 45.53 kg/m.   Physical Exam  Constitutional: She is oriented to person, place, and time. She appears well-developed and well-nourished.  HENT:  Head: Normocephalic and atraumatic.  Right Ear: Hearing, tympanic membrane, external ear and ear canal normal.  Left Ear: Hearing, tympanic membrane, external ear and ear canal normal.  Mouth/Throat: Mucous membranes are normal. Posterior oropharyngeal edema and posterior oropharyngeal erythema present. No oropharyngeal exudate. Tonsils are 1+ on the right. Tonsils are 1+ on the left. No tonsillar exudate.  Uvula midline Normal phonation  Eyes: Pupils are equal, round, and reactive to  light. Conjunctivae and EOM are normal.  Neck: Neck supple.  Cardiovascular: Normal rate, regular rhythm and normal heart sounds. Exam reveals no gallop and no friction rub.  No murmur heard. Pulmonary/Chest: Effort normal and breath sounds normal. She has no wheezes. She has no rales.  Musculoskeletal: She exhibits no edema.  Lymphadenopathy:    She has no cervical adenopathy.  Neurological: She is alert and oriented to person, place, and time.  Skin: Skin is warm and dry.  Psychiatric: She has a normal mood and affect.  Nursing note and vitals reviewed.    Results for orders placed or performed in visit on 01/31/18 (from the past 24 hour(s))  POCT rapid strep A     Status: None  Collection Time: 01/31/18 10:56 AM  Result Value Ref Range   Rapid Strep A Screen Negative Negative      ASSESSMENT and PLAN  1. Sore throat Neg strep. Discussed viral pharyngitis, Due to edema and pain doing pred 40mg  x 1. Strict ER precautions given.  - POCT rapid strep A - Culture, Group A Strep - Epstein-Barr virus VCA antibody panel  2. Fatigue, unspecified type 3. Irregular periods - TSH  Other orders - predniSONE (DELTASONE) 20 MG tablet; Take 2 tablets (40 mg total) by mouth daily with breakfast.    Return if symptoms worsen or fail to improve.    Myles Lipps, MD Primary Care at [facility] [address] [place], [place] [address] Ph.  [phone] Fax [phone]

## 2018-01-31 NOTE — Patient Instructions (Signed)
° ° ° °  If you have lab work done today you will be contacted with your lab results within the next 2 weeks.  If you have not heard from us then please contact us. The fastest way to get your results is to register for My Chart. ° ° °IF you received an x-ray today, you will receive an invoice from [facility]. Please contact [facility] at [phone] with questions or concerns regarding your invoice.  ° °IF you received labwork today, you will receive an invoice from [facility]. Please contact [facility] at [phone] with questions or concerns regarding your invoice.  ° °Our billing staff will not be able to assist you with questions regarding bills from these companies. ° °You will be contacted with the lab results as soon as they are available. The fastest way to get your results is to activate your My Chart account. Instructions are located on the last page of this paperwork. If you have not heard from us regarding the results in 2 weeks, please contact this office. °  ° ° ° °

## 2018-02-01 ENCOUNTER — Ambulatory Visit (HOSPITAL_COMMUNITY)
Admission: EM | Admit: 2018-02-01 | Discharge: 2018-02-01 | Disposition: A | Discharge status: Home / Self Care | Payer: BLUE CROSS/BLUE SHIELD | Attending: Family Medicine | Admitting: Family Medicine

## 2018-02-01 ENCOUNTER — Encounter (HOSPITAL_COMMUNITY): Payer: Self-pay | Admitting: Family Medicine

## 2018-02-01 ENCOUNTER — Other Ambulatory Visit: Payer: Self-pay

## 2018-02-01 DIAGNOSIS — J029 Acute pharyngitis, unspecified: Secondary | ICD-10-CM | POA: Diagnosis not present

## 2018-02-01 LAB — EPSTEIN-BARR VIRUS VCA ANTIBODY PANEL
EBV Early Antigen Ab, IgG: <9 U/mL (ref 0.0–8.9)
EBV NA IgG: 26.4 U/mL — ABNORMAL HIGH (ref 0.0–17.9)
EBV VCA IgG: 550 U/mL — ABNORMAL HIGH (ref 0.0–17.9)
EBV VCA IgM: <36 U/mL (ref 0.0–35.9)

## 2018-02-01 LAB — TSH: TSH: 1.95 u[IU]/mL (ref 0.450–4.500)

## 2018-02-01 MED ORDER — CEFTRIAXONE SODIUM 1 G IJ SOLR
1.0000 g | Freq: Once | INTRAMUSCULAR | Status: AC
  Administered 2018-02-01: 1 g via INTRAMUSCULAR

## 2018-02-01 MED ORDER — LIDOCAINE HCL (PF) 1 % IJ SOLN
INTRAMUSCULAR | Status: AC
  Filled 2018-02-01: qty 2

## 2018-02-01 MED ORDER — CEFDINIR 300 MG PO CAPS: 600.0000 mg | ORAL_CAPSULE | Freq: Every day | ORAL | 0 refills | Status: DC

## 2018-02-01 MED ORDER — FLUCONAZOLE 150 MG PO TABS: 150.0000 mg | ORAL_TABLET | Freq: Once | ORAL | 0 refills | Status: AC

## 2018-02-01 MED ORDER — HYDROCODONE-ACETAMINOPHEN 5-325 MG PO TABS: 1.0000 | ORAL_TABLET | Freq: Four times a day (QID) | ORAL | 0 refills | Status: DC | PRN

## 2018-02-01 MED ORDER — CEFTRIAXONE SODIUM 1 G IJ SOLR
INTRAMUSCULAR | Status: AC
  Filled 2018-02-01: qty 10

## 2018-02-01 NOTE — ED Triage Notes (Signed)
Sudden onset of a sore throat on Tuesday.  Seen at [facility] yesterday and strep test negative.

## 2018-02-01 NOTE — Discharge Instructions (Addendum)
Please return if you are not seeing significant improvement over the next 24 hours.  Feel free to return if your pain is as bad as it is today.

## 2018-02-01 NOTE — ED Provider Notes (Signed)
[facility]    CSN: [id number] Arrival date & time: 02/01/18  1849     History   Chief Complaint Chief Complaint  Patient presents with  . Sore Throat    HPI Ann Santos is a 37 y.o. female.   37 year old woman comes in for evaluation of sore throat.  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  She had a severe strep infection of her throat in August and was treated in the emergency department.  Since that time she has been chronically tired and the sore throat has been mild but steady.  Over the last 48 hours, she abruptly developed severe sharp throat pain, made worse by swallowing.  She saw her doctors at [facility] urgent care who said she had a virus and she was prescribed some codeine cough syrup.  Patient is having trouble sleeping.     Past Medical History:  Diagnosis Date  . Abdominal distention   . Abdominal pain   . Anxiety   . Blood in stool   . Constipation   . Diverticulitis    Per pt. States she never had CT scan to confirm this  . Diverticulosis    PT HOSPITALIZED AT [facility] October 03, 2011  . Gestational hypertension 07/05/2013  . Gestational hypertension 07/05/2013  . History of chicken pox    childhood  . History of hiatal hernia   . Hypertension    During pregnancy only  . Maternal obesity, antepartum 07/05/2013  . Maternal obesity, antepartum 07/05/2013  . Numbness    first two fingers right hand   . Rectal bleeding   . Shortness of breath dyspnea    with exercise  . Umbilical hernia    planning surgical repair    Patient Active Problem List   Diagnosis Date Noted  . Sore throat 12/11/2017  . Streptococcal pharyngitis 12/11/2017  . Morbid obesity (HCC) 11/17/2014  . Atypical chest pain 07/17/2013  . Diverticular disease 10/23/2012  . Umbilical hernia 10/26/2011  . Anxiety 07/26/2011    Past Surgical History:  Procedure Laterality Date  . BREATH TEK H PYLORI N/A 07/22/2014   Procedure:  BREATH TEK H PYLORI;  Surgeon: Ovidio Kin, MD;  Location: [facility];  Service: General;  Laterality: N/A;  . CESAREAN SECTION  05/28/09  . CESAREAN SECTION N/A 07/05/2013   Procedure: REPEAT CESAREAN SECTION (With Special Wound Vac);  Surgeon: Robley Fries, MD;  Location: [facility];  Service: Obstetrics;  Laterality: N/A;   EDD: 07/23/13  . COLONOSCOPY  10/07/2011   Procedure: COLONOSCOPY;  Surgeon: Theda Belfast, MD;  Location: [facility];  Service: Endoscopy;  Laterality: N/A;  . ESOPHAGOGASTRODUODENOSCOPY  10/07/2011   Procedure: ESOPHAGOGASTRODUODENOSCOPY (EGD);  Surgeon: Theda Belfast, MD;  Location: [facility];  Service: Endoscopy;  Laterality: N/A;  . LAPAROSCOPIC GASTRIC SLEEVE RESECTION WITH HIATAL HERNIA REPAIR N/A 11/17/2014   Procedure: LAPAROSCOPIC GASTRIC SLEEVE RESECTION ;  Surgeon: Ovidio Kin, MD;  Location: [facility];  Service: General;  Laterality: N/A;  . SCAR REVISION  09/17/09   c-section scar revision  . UMBILICAL HERNIA REPAIR  11/01/2011   Procedure: HERNIA REPAIR UMBILICAL ADULT;  Surgeon: Velora Heckler, MD;  Location: [facility];  Service: General;  Laterality: N/A;    OB History    Gravida  2   Para  2   Term  2   Preterm      AB      Living  2  SAB      TAB      Ectopic      Multiple      Live Births  2            Home Medications    Prior to Admission medications   Medication Sig Start Date End Date Taking? Authorizing Provider  acetaminophen (TYLENOL) 500 MG tablet Take 500 mg by mouth every 6 (six) hours as needed for moderate pain. Reported on 07/20/2015    [provider]  CALCIUM PO Take 1 tablet daily by mouth.    [provider]  cefdinir (OMNICEF) 300 MG capsule Take 2 capsules (600 mg total) by mouth daily. 02/01/18   Elvina Sidle, MD  cetirizine (ZYRTEC) 10 MG tablet Take 10 mg by mouth daily as needed for allergies. Reported on 07/20/2015    [provider]  CRYSELLE-28 0.3-30 MG-MCG tablet Take  1 tablet by mouth daily. Reported on 07/20/2015 10/30/14   [provider]  fluconazole (DIFLUCAN) 150 MG tablet Take 1 tablet (150 mg total) by mouth once for 1 dose. Repeat if needed 02/01/18 02/01/18  Elvina Sidle, MD  HYDROcodone-acetaminophen (NORCO) 5-325 MG tablet Take 1 tablet by mouth every 6 (six) hours as needed for moderate pain. 02/01/18   Elvina Sidle, MD  lisdexamfetamine (VYVANSE) 30 MG capsule Take 1 capsule (30 mg total) by mouth daily. 01/22/18   Arfeen, Phillips Grout, MD  LYSINE HCL PO Take 1 tablet daily by mouth.    [provider]  predniSONE (DELTASONE) 20 MG tablet Take 2 tablets (40 mg total) by mouth daily with breakfast. 01/31/18   Myles Lipps, MD  Probiotic Product (PROBIOTIC COLON SUPPORT PO) Take 1 tablet by mouth daily.    [provider]  pseudoephedrine (SUDAFED) 30 MG tablet Take 30 mg every 4 (four) hours as needed by mouth for congestion.    [provider]  sertraline (ZOLOFT) 100 MG tablet Take 1 tablet (100 mg total) by mouth at bedtime. 12/18/17   Arfeen, Phillips Grout, MD  traZODone (DESYREL) 50 MG tablet Take 1 tablet (50 mg total) by mouth at bedtime. 12/18/17   Cleotis Nipper, MD    Family History Family History  Problem Relation Age of Onset  . Diabetes Mother   . Hypertension Mother   . Ulcerative colitis Father   . Hypertension Father   . Cancer Maternal Grandmother        lung    Social History Social History   Tobacco Use  . Smoking status: Never Smoker  . Smokeless tobacco: Never Used  Substance Use Topics  . Alcohol use: No    Alcohol/week: 0.0 standard drinks  . Drug use: No     Allergies   Patient has no known allergies.   Review of Systems Review of Systems   Physical Exam Triage Vital Signs ED Triage Vitals  Enc Vitals Group     BP      Pulse      Resp      Temp      Temp src      SpO2      Weight      Height      Head Circumference      Peak Flow      Pain Score      Pain  Loc      Pain Edu?      Excl. in GC?    No data found.  Updated  Vital Signs BP (!) 137/95 (BP Location: Left Arm)   Pulse 77   Temp 98.8 F (37.1 C) (Oral)   Resp 20   SpO2 98%    Physical Exam  Constitutional: She appears well-developed and well-nourished.  HENT:  Head: Normocephalic.  Right Ear: Hearing, tympanic membrane and ear canal normal.  Left Ear: Hearing, tympanic membrane and ear canal normal.  Mouth/Throat: Uvula is midline. Oropharyngeal exudate and posterior oropharyngeal erythema present. Tonsils are 1+ on the right. Tonsils are 1+ on the left.  Eyes: Pupils are equal, round, and reactive to light.  Neck: Normal range of motion. Neck supple.  Cardiovascular: Normal rate.  Pulmonary/Chest: Effort normal.  Neurological: She is alert.  Skin: Skin is warm and dry.  Psychiatric:  Patient is crying with pain.  Nursing note and vitals reviewed.    UC Treatments / Results  Labs (all labs ordered are listed, but only abnormal results are displayed) Labs Reviewed - No data to display  EKG None  Radiology No results found.  Procedures Procedures (including critical care time)  Medications Ordered in UC Medications  cefTRIAXone (ROCEPHIN) injection 1 g (has no administration in time range)    Initial Impression / Assessment and Plan / UC Course  I have reviewed the triage vital signs and the nursing notes.  Pertinent labs & imaging results that were available during my care of the patient were reviewed by me and considered in my medical decision making (see chart for details).     Final Clinical Impressions(s) / UC Diagnoses   Final diagnoses:  Pharyngitis, unspecified etiology     Discharge Instructions     Please return if you are not seeing significant improvement over the next 24 hours.    ED Prescriptions    Medication Sig Dispense Auth. Provider   HYDROcodone-acetaminophen (NORCO) 5-325 MG tablet Take 1 tablet by mouth every 6 (six)  hours as needed for moderate pain. 12 tablet Elvina Sidle, MD   cefdinir (OMNICEF) 300 MG capsule Take 2 capsules (600 mg total) by mouth daily. 20 capsule Elvina Sidle, MD   fluconazole (DIFLUCAN) 150 MG tablet Take 1 tablet (150 mg total) by mouth once for 1 dose. Repeat if needed 2 tablet Elvina Sidle, MD     Controlled Substance Prescriptions [place] Controlled Substance Registry consulted? Not Applicable   Elvina Sidle, MD 02/01/18 1944

## 2018-02-02 LAB — CULTURE, GROUP A STREP: Strep A Culture: POSITIVE — AB

## 2018-02-05 ENCOUNTER — Telehealth: Payer: Self-pay | Admitting: *Deleted

## 2018-02-05 NOTE — Telephone Encounter (Signed)
Left message to call back for results.  Please advise patient her culture that was sent out just came back as positive for strep A.  The medication she received at Urgent Care will work for the positive culture and she should continue to take it to finish.

## 2018-02-05 NOTE — Telephone Encounter (Signed)
Message from Coker regarding results read to patient; pt verbalizes understanding.

## 2018-02-12 ENCOUNTER — Ambulatory Visit (HOSPITAL_COMMUNITY): Payer: BLUE CROSS/BLUE SHIELD | Admitting: Psychiatry

## 2018-02-19 ENCOUNTER — Other Ambulatory Visit: Payer: Self-pay

## 2018-02-19 ENCOUNTER — Encounter (HOSPITAL_COMMUNITY): Payer: Self-pay | Admitting: Emergency Medicine

## 2018-02-19 ENCOUNTER — Ambulatory Visit (HOSPITAL_COMMUNITY)
Admission: EM | Admit: 2018-02-19 | Discharge: 2018-02-19 | Disposition: A | Discharge status: Home / Self Care | Payer: BLUE CROSS/BLUE SHIELD | Attending: Family Medicine | Admitting: Family Medicine

## 2018-02-19 DIAGNOSIS — J020 Streptococcal pharyngitis: Secondary | ICD-10-CM

## 2018-02-19 LAB — POCT RAPID STREP A: Streptococcus, Group A Screen (Direct): POSITIVE — AB

## 2018-02-19 MED ORDER — AMOXICILLIN-POT CLAVULANATE 875-125 MG PO TABS: 1.0000 | ORAL_TABLET | Freq: Two times a day (BID) | ORAL | 0 refills | Status: AC

## 2018-02-19 NOTE — ED Provider Notes (Signed)
[facility]    CSN: [id number] Arrival date & time: 02/19/18  4098     History   Chief Complaint Chief Complaint  Patient presents with  . Sore Throat    HPI Ann Santos is a 37 y.o. female history of anxiety presenting today for evaluation of a sore throat.  Patient states that her sore throat began approximately 3 days ago and has continued to worsen.  She notes that over the past month she has had strep twice.  Most recently she has finished a round of Omnicef on 10/20.  States that she has the beginnings of very similar symptoms.  Began with headache and scratchiness in throat.  Has had some phlegm production, but denies coughing, minimal rhinorrhea.  She has not taken any over-the-counter medicines today.  Patient works as a Runner, broadcasting/film/video.  HPI  Past Medical History:  Diagnosis Date  . Abdominal distention   . Abdominal pain   . Anxiety   . Blood in stool   . Constipation   . Diverticulitis    Per pt. States she never had CT scan to confirm this  . Diverticulosis    PT [facility]PITALIZED AT [facility] October 03, 2011  . Gestational hypertension 07/05/2013  . Gestational hypertension 07/05/2013  . History of chicken pox    childhood  . History of hiatal hernia   . Hypertension    During pregnancy only  . Maternal obesity, antepartum 07/05/2013  . Maternal obesity, antepartum 07/05/2013  . Numbness    first two fingers right hand   . Rectal bleeding   . Shortness of breath dyspnea    with exercise  . Umbilical hernia    planning surgical repair    Patient Active Problem List   Diagnosis Date Noted  . Sore throat 12/11/2017  . Streptococcal pharyngitis 12/11/2017  . Morbid obesity (HCC) 11/17/2014  . Atypical chest pain 07/17/2013  . Diverticular disease 10/23/2012  . Umbilical hernia 10/26/2011  . Anxiety 07/26/2011    Past Surgical History:  Procedure Laterality Date  . BREATH TEK H PYLORI N/A 07/22/2014   Procedure: BREATH TEK H PYLORI;  Surgeon: Ovidio Kin, MD;  Location: [facility];  Service: General;  Laterality: N/A;  . CESAREAN SECTION  05/28/09  . CESAREAN SECTION N/A 07/05/2013   Procedure: REPEAT CESAREAN SECTION (With Special Wound Vac);  Surgeon: Robley Fries, MD;  Location: [facility];  Service: Obstetrics;  Laterality: N/A;   EDD: 07/23/13  . COLONOSCOPY  10/07/2011   Procedure: COLONOSCOPY;  Surgeon: Theda Belfast, MD;  Location: [facility];  Service: Endoscopy;  Laterality: N/A;  . ESOPHAGOGASTRODUODENOSCOPY  10/07/2011   Procedure: ESOPHAGOGASTRODUODENOSCOPY (EGD);  Surgeon: Theda Belfast, MD;  Location: [facility];  Service: Endoscopy;  Laterality: N/A;  . LAPAROSCOPIC GASTRIC SLEEVE RESECTION WITH HIATAL HERNIA REPAIR N/A 11/17/2014   Procedure: LAPAROSCOPIC GASTRIC SLEEVE RESECTION ;  Surgeon: Ovidio Kin, MD;  Location: [facility];  Service: General;  Laterality: N/A;  . SCAR REVISION  09/17/09   c-section scar revision  . UMBILICAL HERNIA REPAIR  11/01/2011   Procedure: HERNIA REPAIR UMBILICAL ADULT;  Surgeon: Velora Heckler, MD;  Location: [facility];  Service: General;  Laterality: N/A;    OB History    Gravida  2   Para  2   Term  2   Preterm      AB      Living  2     SAB      TAB  Ectopic      Multiple      Live Births  2            Home Medications    Prior to Admission medications   Medication Sig Start Date End Date Taking? Authorizing Provider  acetaminophen (TYLENOL) 500 MG tablet Take 500 mg by mouth every 6 (six) hours as needed for moderate pain. Reported on 07/20/2015   Yes [provider]  CALCIUM PO Take 1 tablet daily by mouth.   Yes [provider]  cetirizine (ZYRTEC) 10 MG tablet Take 10 mg by mouth daily as needed for allergies. Reported on 07/20/2015   Yes [provider]  CRYSELLE-28 0.3-30 MG-MCG tablet Take 1 tablet by mouth daily. Reported on 07/20/2015 10/30/14  Yes [provider]  lisdexamfetamine (VYVANSE) 30 MG capsule Take 1 capsule  (30 mg total) by mouth daily. 01/22/18  Yes Arfeen, Phillips Grout, MD  pseudoephedrine (SUDAFED) 30 MG tablet Take 30 mg every 4 (four) hours as needed by mouth for congestion.   Yes [provider]  sertraline (ZOLOFT) 100 MG tablet Take 1 tablet (100 mg total) by mouth at bedtime. 12/18/17  Yes Arfeen, Phillips Grout, MD  traZODone (DESYREL) 50 MG tablet Take 1 tablet (50 mg total) by mouth at bedtime. 12/18/17  Yes Arfeen, Phillips Grout, MD  amoxicillin-clavulanate (AUGMENTIN) 875-125 MG tablet Take 1 tablet by mouth every 12 (twelve) hours for 10 days. 02/19/18 03/01/18  Valen Mascaro C, PA-C  LYSINE HCL PO Take 1 tablet daily by mouth.    [provider]  Probiotic Product (PROBIOTIC COLON SUPPORT PO) Take 1 tablet by mouth daily.    [provider]    Family History Family History  Problem Relation Age of Onset  . Diabetes Mother   . Hypertension Mother   . Ulcerative colitis Father   . Hypertension Father   . Cancer Maternal Grandmother        lung    Social History Social History   Tobacco Use  . Smoking status: Never Smoker  . Smokeless tobacco: Never Used  Substance Use Topics  . Alcohol use: No    Alcohol/week: 0.0 standard drinks  . Drug use: No     Allergies   Patient has no known allergies.   Review of Systems Review of Systems  Constitutional: Negative for activity change, appetite change, chills, fatigue and fever.  HENT: Positive for sore throat. Negative for congestion, ear pain, rhinorrhea, sinus pressure and trouble swallowing.   Eyes: Negative for discharge and redness.  Respiratory: Negative for cough, chest tightness and shortness of breath.   Cardiovascular: Negative for chest pain.  Gastrointestinal: Negative for abdominal pain, diarrhea, nausea and vomiting.  Musculoskeletal: Negative for myalgias.  Skin: Negative for rash.  Neurological: Positive for headaches. Negative for dizziness and light-headedness.     Physical Exam Triage Vital  Signs ED Triage Vitals  Enc Vitals Group     BP 02/19/18 0830 (!) 152/95     Pulse Rate 02/19/18 0830 89     Resp 02/19/18 0830 18     Temp 02/19/18 0830 98.7 F (37.1 C)     Temp Source 02/19/18 0830 Oral     SpO2 02/19/18 0830 96 %     Weight --      Height --      Head Circumference --      Peak Flow --      Pain Score 02/19/18 0839 6  Pain Loc --      Pain Edu? --      Excl. in GC? --    No data found.  Updated Vital Signs BP (!) 152/95 (BP Location: Left Arm)   Pulse 89   Temp 98.7 F (37.1 C) (Oral)   Resp 18   SpO2 96%   Visual Acuity Right Eye Distance:   Left Eye Distance:   Bilateral Distance:    Right Eye Near:   Left Eye Near:    Bilateral Near:     Physical Exam  Constitutional: She appears well-developed and well-nourished. No distress.  HENT:  Head: Normocephalic and atraumatic.  Bilateral ears without tenderness to palpation of external auricle, tragus and mastoid, EAC's without erythema or swelling, TM's with good bony landmarks and cone of light. Non erythematous.  Oral mucosa pink and moist, mild tonsillar enlargement, right tonsil significantly erythematous with small amount of exudate left tonsil mildly erythematous, no exudate,. Posterior pharynx patent and nonerythematous, no uvula deviation or swelling. Normal phonation.  Eyes: Conjunctivae are normal.  Neck: Neck supple.  Cardiovascular: Normal rate and regular rhythm.  No murmur heard. Pulmonary/Chest: Effort normal and breath sounds normal. No respiratory distress.  Breathing comfortably at rest, CTABL, no wheezing, rales or other adventitious sounds auscultated  Abdominal: Soft. There is no tenderness.  Musculoskeletal: She exhibits no edema.  Neurological: She is alert.  Skin: Skin is warm and dry.  Psychiatric: She has a normal mood and affect.  Nursing note and vitals reviewed.    UC Treatments / Results  Labs (all labs ordered are listed, but only abnormal results are  displayed) Labs Reviewed  POCT RAPID STREP A - Abnormal; Notable for the following components:      Result Value   Streptococcus, Group A Screen (Direct) POSITIVE (*)    All other components within normal limits    EKG None  Radiology No results found.  Procedures Procedures (including critical care time)  Medications Ordered in UC Medications - No data to display  Initial Impression / Assessment and Plan / UC Course  I have reviewed the triage vital signs and the nursing notes.  Pertinent labs & imaging results that were available during my care of the patient were reviewed by me and considered in my medical decision making (see chart for details).     Patient tested positive for strep. No evidence of peritonsillar abscess or retropharyngeal abscess. Patient is nontoxic appearing, no drooling, dysphagia, muffled voice, or tripoding. No trismus.  Will provide patient Augmentin.  Will have patient follow-up with ENT given recurrent nature of strep.Discussed strict return precautions. Patient verbalized understanding and is agreeable with plan.   Final Clinical Impressions(s) / UC Diagnoses   Final diagnoses:  Strep throat     Discharge Instructions     Sore Throat  Your rapid strep test was positive today. We will treat you for strep throat with an antibiotic. Please take Amoxicillin as prescribed.   Please continue Tylenol or Ibuprofen for fever and pain. May try salt water gargles, cepacol lozenges, throat spray, or OTC cold relief medicine for throat discomfort. If you also have congestion take a daily anti-histamine like Zyrtec, Claritin, and a oral decongestant to help with post nasal drip that may be irritating your throat.   Stay hydrated and drink plenty of fluids to keep your throat coated relieve irritation.     ED Prescriptions    Medication Sig Dispense Auth. Provider   amoxicillin-clavulanate (AUGMENTIN) 875-125 MG  tablet Take 1 tablet by mouth every 12  (twelve) hours for 10 days. 20 tablet Silvana Holecek, [place] C, PA-C     Controlled Substance Prescriptions [place] Controlled Substance Registry consulted? Not Applicable   Lew Dawes, [place] 02/19/18 1610

## 2018-02-19 NOTE — Discharge Instructions (Addendum)

## 2018-02-19 NOTE — ED Triage Notes (Signed)
Patient's current sore throat came on Saturday night 10/26.  Has had strep 2 times since the end of august and finished a round of antibiotics -finished cefdinir on 10/20.    Patient wants a referral for ent

## 2018-02-20 ENCOUNTER — Telehealth (HOSPITAL_COMMUNITY): Payer: Self-pay

## 2018-02-20 ENCOUNTER — Other Ambulatory Visit (HOSPITAL_COMMUNITY): Payer: Self-pay | Admitting: Psychiatry

## 2018-02-20 DIAGNOSIS — F900 Attention-deficit hyperactivity disorder, predominantly inattentive type: Secondary | ICD-10-CM

## 2018-02-20 MED ORDER — LISDEXAMFETAMINE DIMESYLATE 30 MG PO CAPS: 30.0000 mg | ORAL_CAPSULE | Freq: Every day | ORAL | 0 refills | Status: DC

## 2018-02-20 NOTE — Telephone Encounter (Signed)
Patient is calling for a refill on her Vyvanse  - pharmacy in chart is correct.

## 2018-02-20 NOTE — Telephone Encounter (Signed)
Prescription called in.  Please asked patient to pick up the medication. 

## 2018-02-22 ENCOUNTER — Encounter

## 2018-02-22 ENCOUNTER — Ambulatory Visit (INDEPENDENT_AMBULATORY_CARE_PROVIDER_SITE_OTHER): Payer: BLUE CROSS/BLUE SHIELD | Admitting: Physician Assistant

## 2018-02-22 ENCOUNTER — Ambulatory Visit (INDEPENDENT_AMBULATORY_CARE_PROVIDER_SITE_OTHER): Payer: BLUE CROSS/BLUE SHIELD

## 2018-02-22 ENCOUNTER — Encounter: Payer: Self-pay | Admitting: Physician Assistant

## 2018-02-22 ENCOUNTER — Other Ambulatory Visit: Payer: Self-pay

## 2018-02-22 VITALS — BP 151/82 | HR 78 | Temp 98.5°F | Resp 20 | Wt 257.1 lb

## 2018-02-22 DIAGNOSIS — M25562 Pain in left knee: Secondary | ICD-10-CM

## 2018-02-22 DIAGNOSIS — G8929 Other chronic pain: Secondary | ICD-10-CM

## 2018-02-22 MED ORDER — DICLOFENAC SODIUM 1 % TD GEL: 2.0000 g | Freq: Four times a day (QID) | TRANSDERMAL | 0 refills | Status: AC

## 2018-02-22 NOTE — Progress Notes (Signed)
Ann Santos  MRN: [id number] DOB: 04-18-1981  Subjective:   Ann Santos is a 37 y.o. female who presents with knee pain involving the left knee. Onset was 2 months. Inciting event: landed on it hard in zumba. Current symptoms include: pain located at lateral knee and up the leg. Pain is aggravated by going up and down stairs, rising after sitting and squatting. Patient has had no prior knee problems. Evaluation to date: none. Treatment to date: OTC analgesics which are not very effective and rest. Rest does seem to help. It is really impacting her life. Started after she increased exercise and started going to zumba classes 4 x per week.   Review of Systems  Per HPI  Patient Active Problem List   Diagnosis Date Noted  . Sore throat 12/11/2017  . Streptococcal pharyngitis 12/11/2017  . Morbid obesity (HCC) 11/17/2014  . Atypical chest pain 07/17/2013  . Diverticular disease 10/23/2012  . Umbilical hernia 10/26/2011  . Anxiety 07/26/2011    Current Outpatient Medications on File Prior to Visit  Medication Sig Dispense Refill  . acetaminophen (TYLENOL) 500 MG tablet Take 500 mg by mouth every 6 (six) hours as needed for moderate pain. Reported on 07/20/2015    . amoxicillin-clavulanate (AUGMENTIN) 875-125 MG tablet Take 1 tablet by mouth every 12 (twelve) hours for 10 days. 20 tablet 0  . CALCIUM PO Take 1 tablet daily by mouth.    . cetirizine (ZYRTEC) 10 MG tablet Take 10 mg by mouth daily as needed for allergies. Reported on 07/20/2015    . CRYSELLE-28 0.3-30 MG-MCG tablet Take 1 tablet by mouth daily. Reported on 07/20/2015  12  . lisdexamfetamine (VYVANSE) 30 MG capsule Take 1 capsule (30 mg total) by mouth daily. 30 capsule 0  . LYSINE HCL PO Take 1 tablet daily by mouth.    . Probiotic Product (PROBIOTIC COLON SUPPORT PO) Take 1 tablet by mouth daily.    . pseudoephedrine (SUDAFED) 30 MG tablet Take 30 mg every 4 (four) hours as needed by mouth for congestion.    . sertraline  (ZOLOFT) 100 MG tablet Take 1 tablet (100 mg total) by mouth at bedtime. 90 tablet 0  . traZODone (DESYREL) 50 MG tablet Take 1 tablet (50 mg total) by mouth at bedtime. 90 tablet 0  . fluconazole (DIFLUCAN) 150 MG tablet      No current facility-administered medications on file prior to visit.     No Known Allergies    . Social History   Socioeconomic History  . Marital status: Married    Spouse name: Not on file  . Number of children: 2  . Years of education: Not on file  . Highest education level: Not on file  Occupational History  . Not on file  Social Needs  . Financial resource strain: Not on file  . Food insecurity:    Worry: Not on file    Inability: Not on file  . Transportation needs:    Medical: Not on file    Non-medical: Not on file  Tobacco Use  . Smoking status: Never Smoker  . Smokeless tobacco: Never Used  Substance and Sexual Activity  . Alcohol use: Yes    Alcohol/week: 0.0 standard drinks    Comment: occ  . Drug use: No  . Sexual activity: Yes    Partners: Male    Birth control/protection: Pill  Lifestyle  . Physical activity:    Days per week: Not on file  Minutes per session: Not on file  . Stress: Not on file  Relationships  . Social connections:    Talks on phone: Not on file    Gets together: Not on file    Attends religious service: Not on file    Active member of club or organization: Not on file    Attends meetings of clubs or organizations: Not on file    Relationship status: Not on file  . Intimate partner violence:    Fear of current or ex partner: Not on file    Emotionally abused: Not on file    Physically abused: Not on file    Forced sexual activity: Not on file  Other Topics Concern  . Not on file  Social History Narrative  . Not on file    Past Surgical History:  Procedure Laterality Date  . BREATH TEK H PYLORI N/A 07/22/2014   Procedure: BREATH TEK H PYLORI;  Surgeon: Ovidio Kin, MD;  Location: [facility];   Service: General;  Laterality: N/A;  . CESAREAN SECTION  05/28/09  . CESAREAN SECTION N/A 07/05/2013   Procedure: REPEAT CESAREAN SECTION (With Special Wound Vac);  Surgeon: Robley Fries, MD;  Location: [facility];  Service: Obstetrics;  Laterality: N/A;   EDD: 07/23/13  . COLONOSCOPY  10/07/2011   Procedure: COLONOSCOPY;  Surgeon: Theda Belfast, MD;  Location: [facility];  Service: Endoscopy;  Laterality: N/A;  . ESOPHAGOGASTRODUODENOSCOPY  10/07/2011   Procedure: ESOPHAGOGASTRODUODENOSCOPY (EGD);  Surgeon: Theda Belfast, MD;  Location: [facility];  Service: Endoscopy;  Laterality: N/A;  . LAPAROSCOPIC GASTRIC SLEEVE RESECTION WITH HIATAL HERNIA REPAIR N/A 11/17/2014   Procedure: LAPAROSCOPIC GASTRIC SLEEVE RESECTION ;  Surgeon: Ovidio Kin, MD;  Location: [facility];  Service: General;  Laterality: N/A;  . SCAR REVISION  09/17/09   c-section scar revision  . UMBILICAL HERNIA REPAIR  11/01/2011   Procedure: HERNIA REPAIR UMBILICAL ADULT;  Surgeon: Velora Heckler, MD;  Location: [facility];  Service: General;  Laterality: N/A;    Objective:  BP (!) 151/82   Pulse 78   Temp 98.5 F (36.9 C) (Oral)   Resp 20   Wt 257 lb 1.6 oz (116.6 kg)   LMP 02/01/2018 (Approximate)   SpO2 97%   BMI 45.91 kg/m   Physical Exam  Constitutional: She is oriented to person, place, and time. She appears well-developed and well-nourished.  HENT:  Head: Normocephalic and atraumatic.  Eyes: Conjunctivae are normal.  Neck: Normal range of motion.  Cardiovascular:  Pulses:      Dorsalis pedis pulses are 2+ on the right side, and 2+ on the left side.       Posterior tibial pulses are 2+ on the right side, and 2+ on the left side.  Pulmonary/Chest: Effort normal.  Musculoskeletal:       Right knee: Normal.       Left knee: She exhibits normal range of motion, no swelling, no ecchymosis, no erythema, no LCL laxity, normal patellar mobility, normal meniscus and no MCL laxity. Tenderness ( +TTP at point where the  iliotibial band (ITB) courses over the lateral femoral epicondyle) found. No medial joint line, no MCL, no LCL and no patellar tendon tenderness noted.  Neurological: She is alert and oriented to person, place, and time.  Sensation of BLE Strength of BLE 5/5  Skin: Skin is warm and dry.  Psychiatric: She has a normal mood and affect.  Vitals reviewed.  Dg Knee Complete 4 Views Left  Result Date: 02/22/2018 CLINICAL DATA:  Left lateral knee pain for 2 months. EXAM: LEFT KNEE - COMPLETE 4+ VIEW COMPARISON:  None. FINDINGS: No evidence of fracture, dislocation, or joint effusion. No evidence of arthropathy or other focal bone abnormality. Soft tissues are unremarkable. IMPRESSION: Negative. Electronically Signed   By: Amie Portland M.D.   On: 02/22/2018 14:35    Assessment and Plan :  1. Chronic pain of left knee Hx and PE findings consistent with IT band syndrome.  Plain films negative.  Past surgical history of gastric sleeve, will avoid oral NSAIDs.  Recommend topical Voltaren gel, rest, and stretches.  Follow-up with sports medicine and PT. - DG Knee Complete 4 Views Left; Future - diclofenac sodium (VOLTAREN) 1 % GEL; Apply 2 g topically 4 (four) times daily for 7 days.  Dispense: 100 g; Refill: 0 - Ambulatory referral to Physical Therapy - Ambulatory referral to Sports Medicine   Benjiman Core PA-C  Primary Care at [facility] 02/22/2018 2:38 PM

## 2018-02-22 NOTE — Patient Instructions (Addendum)
Perform stretches below. Use ice. Use topical gel for the next week. Follow up with sports med and PT.   Iliotibial Band Syndrome Iliotibial band syndrome (ITBS) is a condition that often causes knee pain. It can also cause pain in the outside of your hip, thigh, and knee. The iliotibial band is a strip of tissue that runs from the outside of your hip and down your thigh to the outside of your knee. Repeatedly bending and straightening your knee can irritate the iliotibial band. What are the causes? This condition is caused by inflammation and irritation from the friction of the iliotibial band moving over the thigh bone (femur) when you repeatedly bend and straighten your knee. What increases the risk? This condition is more likely to develop in people who:  Frequently change elevation during their workouts.  Run very long distances.  Recently increased the length or intensity of their workouts.  Run downhill often, or just started running downhill.  Ride a bike very far or often.  You may also be at greater risk if you start a new workout routine without first warming up or if you have a job that requires you to bend, squat, or climb frequently. What are the signs or symptoms? Symptoms of this condition include:  Pain along the outside of your knee that may be worse with activity, especially running or going up and down stairs.  A "snapping" sensation over your knee.  Swelling on the outside of your knee.  Pain or a feeling of tightness in your hip.  How is this diagnosed? This condition is diagnosed based on your symptoms, medical history, and physical exam. You may also see a health care provider who specializes in reducing pain and increasing mobility (physical therapist). A physical therapist may do an exam to check your balance, movement, and way of walking or running (gait) to see whether the way you move could contribute to your injury. You may also have tests to measure  your strength, flexibility, and range of motion. How is this treated? Treatment for this condition includes:  Resting and limiting exercise.  Returning to activities gradually.  Doing range-of-motion and strengthening exercises (physical therapy) as told by your health care provider.  Including low-impact activities, such as swimming, in your exercise routine.  Follow these instructions at home:  If directed, apply ice to the injured area. ? Put ice in a plastic bag. ? Place a towel between your skin and the bag. ? Leave the ice on for 20 minutes, 2-3 times per day.  Return to your normal activities as told by your health care provider. Ask your health care provider what activities are safe for you.  Keep all follow-up visits with your health care provider. This is important. Contact a health care provider if:  Your pain does not improve or gets worse despite treatment. This information is not intended to replace advice given to you by your health care provider. Make sure you discuss any questions you have with your health care provider. Document Released: 10/01/2001 Document Revised: 05/13/2016 Document Reviewed: 05/13/2016 Elsevier Interactive Patient Education  2018 [employer]   Iliotibial Band Syndrome Rehab Ask your health care provider which exercises are safe for you. Do exercises exactly as told by your health care provider and adjust them as directed. It is normal to feel mild stretching, pulling, tightness, or discomfort as you do these exercises, but you should stop right away if you feel sudden pain or your pain gets worse.Do  not begin these exercises until told by your health care provider. Stretching and range of motion exercises These exercises warm up your muscles and joints and improve the movement and flexibility of your hip and pelvis. Exercise A: Quadriceps, prone  1. Lie on your abdomen on a firm surface, such as a bed or padded floor. 2. Bend your left  / right knee and hold your ankle. If you cannot reach your ankle or pant leg, loop a belt around your foot and grab the belt instead. 3. Gently pull your heel toward your buttocks. Your knee should not slide out to the side. You should feel a stretch in the front of your thigh and knee. 4. Hold this position for __________ seconds. Repeat __________ times. Complete this stretch __________ times a day. Exercise B: Iliotibial band  1. Lie on your side with your left / right leg in the top position. 2. Bend both of your knees and grab your left / right ankle. Stretch out your bottom arm to help you balance. 3. Slowly bring your top knee back so your thigh goes behind your trunk. 4. Slowly lower your top leg toward the floor until you feel a gentle stretch on the outside of your left / right hip and thigh. If you do not feel a stretch and your knee will not fall farther, place the heel of your other foot on top of your knee and pull your knee down toward the floor with your foot. 5. Hold this position for __________ seconds. Repeat __________ times. Complete this stretch __________ times a day. Strengthening exercises These exercises build strength and endurance in your hip and pelvis. Endurance is the ability to use your muscles for a long time, even after they get tired. Exercise C: Straight leg raises ( hip abductors) 1. Lie on your side with your left / right leg in the top position. Lie so your head, shoulder, knee, and hip line up. You may bend your bottom knee to help you balance. 2. Roll your hips slightly forward so your hips are stacked directly over each other and your left / right knee is facing forward. 3. Tense the muscles in your outer thigh and lift your top leg 4-6 inches (10-15 cm). 4. Hold this position for __________ seconds. 5. Slowly return to the starting position. Let your muscles relax completely before doing another repetition. Repeat __________ times. Complete this  exercise __________ times a day. Exercise D: Straight leg raises ( hip extensors) 1. Lie on your abdomen on your bed or a firm surface. You can put a pillow under your hips if that is more comfortable. 2. Bend your left / right knee so your foot is straight up in the air. 3. Squeeze your buttock muscles and lift your left / right thigh off the bed. Do not let your back arch. 4. Tense this muscle as hard as you can without increasing any knee pain. 5. Hold this position for __________ seconds. 6. Slowly lower your leg to the starting position and allow it to relax completely. Repeat __________ times. Complete this exercise __________ times a day. Exercise E: Hip hike 1. Stand sideways on a bottom step. Stand on your left / right leg with your other foot unsupported next to the step. You can hold onto the railing or wall if needed for balance. 2. Keep your knees straight and your torso square. Then, lift your left / right hip up toward the ceiling. 3. Slowly let your left /  right hip lower toward the floor, past the starting position. Your foot should get closer to the floor. Do not lean or bend your knees. Repeat __________ times. Complete this exercise __________ times a day. This information is not intended to replace advice given to you by your health care provider. Make sure you discuss any questions you have with your health care provider. Document Released: 04/11/2005 Document Revised: 12/15/2015 Document Reviewed: 03/13/2015 Elsevier Interactive Patient Education  [employer].

## 2018-02-23 ENCOUNTER — Telehealth: Payer: Self-pay | Admitting: Physician Assistant

## 2018-02-23 DIAGNOSIS — J0301 Acute recurrent streptococcal tonsillitis: Secondary | ICD-10-CM | POA: Diagnosis not present

## 2018-02-23 NOTE — Telephone Encounter (Signed)
Patient was denied her medication Voltaren Gel by her insurance company. Denial letter placed in the providers box.

## 2018-02-23 NOTE — Telephone Encounter (Signed)
Can we please put an appeal in for this?

## 2018-02-25 ENCOUNTER — Encounter: Payer: Self-pay | Admitting: Physician Assistant

## 2018-02-26 ENCOUNTER — Ambulatory Visit (INDEPENDENT_AMBULATORY_CARE_PROVIDER_SITE_OTHER): Payer: BLUE CROSS/BLUE SHIELD | Admitting: Psychiatry

## 2018-02-26 DIAGNOSIS — F900 Attention-deficit hyperactivity disorder, predominantly inattentive type: Secondary | ICD-10-CM

## 2018-02-26 DIAGNOSIS — F419 Anxiety disorder, unspecified: Secondary | ICD-10-CM | POA: Diagnosis not present

## 2018-02-26 DIAGNOSIS — F330 Major depressive disorder, recurrent, mild: Secondary | ICD-10-CM | POA: Diagnosis not present

## 2018-02-26 MED ORDER — LISDEXAMFETAMINE DIMESYLATE 40 MG PO CAPS: 40.0000 mg | ORAL_CAPSULE | Freq: Every day | ORAL | 0 refills | Status: DC

## 2018-02-26 MED ORDER — TRAZODONE HCL 100 MG PO TABS: 100.0000 mg | ORAL_TABLET | Freq: Every day | ORAL | 0 refills | Status: DC

## 2018-02-26 MED ORDER — SERTRALINE HCL 100 MG PO TABS: 100.0000 mg | ORAL_TABLET | Freq: Every day | ORAL | 0 refills | Status: DC

## 2018-02-26 NOTE — Progress Notes (Signed)
BH MD/PA/NP OP Progress Note  02/26/2018 10:27 AM Ann Santos  MRN:  [id number]  Chief Complaint: I think my Vyvanse is not strong.  There are times when I have difficulty focus and attention.  HPI: Ann Santos came for her follow-up appointment.  She was last seen in May 2019.  She is been taking Vyvanse but she feels some time medicine is not strong.  She has difficulty in focus and attention.  She is a Systems developer at [employer].  She also endorsed some time trazodone 50 mg is not enough as she wake up in the middle of the night.  She told her husband is doing better after TMS treatment.  Patient told she also goes to an [employer] and things are going much better.  Patient denies any crying spells, irritability, mania, psychosis or any major panic attack.  She is seeing therapist Hale Bogus.  Patient denies any tremors, shakes, EPS or any rash.  She denies any feeling of hopelessness or worthlessness.  She denies drinking alcohol or using any illegal substances.  Her appetite is okay.  Her energy level is fair.  Gained weight from the last visit.  She is been having strep throat and she is taking third course of antibiotic.  Her daughter also see child psychiatrist in this office and takes Vyvanse.  Visit Diagnosis:    ICD-10-CM   1. Attention deficit hyperactivity disorder (ADHD), predominantly inattentive type F90.0 lisdexamfetamine (VYVANSE) 40 MG capsule  2. Mild episode of recurrent major depressive disorder (HCC) F33.0 traZODone (DESYREL) 100 MG tablet    sertraline (ZOLOFT) 100 MG tablet  3. Anxiety F41.9 sertraline (ZOLOFT) 100 MG tablet    Past Psychiatric History: Reviewed. Patient started antidepressant in 2009 when her husband going through drug addiction. She tried Celexa, Wellbutrin, Prozac and Strattera. She had a good response with Zoloft and trazodone. Patient denies any history of psychiatric inpatient treatment. Patient denies any history of mania, psychosis,  hallucination or any suicidal attempt.  Past Medical History:  Past Medical History:  Diagnosis Date  . Abdominal distention   . Abdominal pain   . Anxiety   . Blood in stool   . Constipation   . Diverticulitis    Per pt. States she never had CT scan to confirm this  . Diverticulosis    PT HOSPITALIZED AT [facility] October 03, 2011  . Gestational hypertension 07/05/2013  . Gestational hypertension 07/05/2013  . History of chicken pox    childhood  . History of hiatal hernia   . Hypertension    During pregnancy only  . Maternal obesity, antepartum 07/05/2013  . Maternal obesity, antepartum 07/05/2013  . Numbness    first two fingers right hand   . Rectal bleeding   . Shortness of breath dyspnea    with exercise  . Umbilical hernia    planning surgical repair    Past Surgical History:  Procedure Laterality Date  . BREATH TEK H PYLORI N/A 07/22/2014   Procedure: BREATH TEK H PYLORI;  Surgeon: Ovidio Kin, MD;  Location: [facility];  Service: General;  Laterality: N/A;  . CESAREAN SECTION  05/28/09  . CESAREAN SECTION N/A 07/05/2013   Procedure: REPEAT CESAREAN SECTION (With Special Wound Vac);  Surgeon: Robley Fries, MD;  Location: [facility];  Service: Obstetrics;  Laterality: N/A;   EDD: 07/23/13  . COLONOSCOPY  10/07/2011   Procedure: COLONOSCOPY;  Surgeon: Theda Belfast, MD;  Location: [facility];  Service: Endoscopy;  Laterality: N/A;  . ESOPHAGOGASTRODUODENOSCOPY  10/07/2011   Procedure: ESOPHAGOGASTRODUODENOSCOPY (EGD);  Surgeon: Theda Belfast, MD;  Location: [facility];  Service: Endoscopy;  Laterality: N/A;  . LAPAROSCOPIC GASTRIC SLEEVE RESECTION WITH HIATAL HERNIA REPAIR N/A 11/17/2014   Procedure: LAPAROSCOPIC GASTRIC SLEEVE RESECTION ;  Surgeon: Ovidio Kin, MD;  Location: [facility];  Service: General;  Laterality: N/A;  . SCAR REVISION  09/17/09   c-section scar revision  . UMBILICAL HERNIA REPAIR  11/01/2011   Procedure: HERNIA REPAIR UMBILICAL ADULT;  Surgeon: Velora Heckler, MD;  Location: [facility];  Service: General;  Laterality: N/A;    Family Psychiatric History: Reviewed  Family History:  Family History  Problem Relation Age of Onset  . Diabetes Mother   . Hypertension Mother   . Ulcerative colitis Father   . Hypertension Father   . Cancer Maternal Grandmother        lung    Social History:  Social History   Socioeconomic History  . Marital status: Married    Spouse name: Not on file  . Number of children: 2  . Years of education: Not on file  . Highest education level: Not on file  Occupational History  . Not on file  Social Needs  . Financial resource strain: Not on file  . Food insecurity:    Worry: Not on file    Inability: Not on file  . Transportation needs:    Medical: Not on file    Non-medical: Not on file  Tobacco Use  . Smoking status: Never Smoker  . Smokeless tobacco: Never Used  Substance and Sexual Activity  . Alcohol use: Yes    Alcohol/week: 0.0 standard drinks    Comment: occ  . Drug use: No  . Sexual activity: Yes    Partners: Male    Birth control/protection: Pill  Lifestyle  . Physical activity:    Days per week: Not on file    Minutes per session: Not on file  . Stress: Not on file  Relationships  . Social connections:    Talks on phone: Not on file    Gets together: Not on file    Attends religious service: Not on file    Active member of club or organization: Not on file    Attends meetings of clubs or organizations: Not on file    Relationship status: Not on file  Other Topics Concern  . Not on file  Social History Narrative  . Not on file    Allergies: No Known Allergies  Metabolic Disorder Labs: Lab Results  Component Value Date   HGBA1C 4.6 07/07/2016   MPG 85 07/07/2016   No results found for: PROLACTIN Lab Results  Component Value Date   CHOL 182 10/26/2011   TRIG 140 10/26/2011   HDL 47 10/26/2011   CHOLHDL 3.9 10/26/2011   VLDL 28 10/26/2011   LDLCALC 107 (H)  10/26/2011   Lab Results  Component Value Date   TSH 1.950 01/31/2018   TSH 1.83 07/07/2016    Therapeutic Level Labs: No results found for: LITHIUM No results found for: VALPROATE No components found for:  CBMZ  Current Medications: Current Outpatient Medications  Medication Sig Dispense Refill  . acetaminophen (TYLENOL) 500 MG tablet Take 500 mg by mouth every 6 (six) hours as needed for moderate pain. Reported on 07/20/2015    . amoxicillin-clavulanate (AUGMENTIN) 875-125 MG tablet Take 1 tablet by mouth every 12 (twelve) hours for 10 days. 20 tablet  0  . CALCIUM PO Take 1 tablet daily by mouth.    . cetirizine (ZYRTEC) 10 MG tablet Take 10 mg by mouth daily as needed for allergies. Reported on 07/20/2015    . CRYSELLE-28 0.3-30 MG-MCG tablet Take 1 tablet by mouth daily. Reported on 07/20/2015  12  . diclofenac sodium (VOLTAREN) 1 % GEL Apply 2 g topically 4 (four) times daily for 7 days. 100 g 0  . fluconazole (DIFLUCAN) 150 MG tablet     . lisdexamfetamine (VYVANSE) 30 MG capsule Take 1 capsule (30 mg total) by mouth daily. 30 capsule 0  . LYSINE HCL PO Take 1 tablet daily by mouth.    . Probiotic Product (PROBIOTIC COLON SUPPORT PO) Take 1 tablet by mouth daily.    . pseudoephedrine (SUDAFED) 30 MG tablet Take 30 mg every 4 (four) hours as needed by mouth for congestion.    . sertraline (ZOLOFT) 100 MG tablet Take 1 tablet (100 mg total) by mouth at bedtime. 90 tablet 0  . traZODone (DESYREL) 50 MG tablet Take 1 tablet (50 mg total) by mouth at bedtime. 90 tablet 0   No current facility-administered medications for this visit.      Musculoskeletal: Strength & Muscle Tone: within normal limits Gait & Station: normal Patient leans: N/A  Psychiatric Specialty Exam: ROS  Blood pressure 128/74, pulse 72, height 5' 1.5" (1.562 m), weight 257 lb (116.6 kg), last menstrual period 02/01/2018.There is no height or weight on file to calculate BMI.  General Appearance: Casual and  Obese  Eye Contact:  Good  Speech:  Fast but coherent  Volume:  Normal  Mood:  Pleasant  Affect:  Congruent  Thought Process:  Goal Directed  Orientation:  Full (Time, Place, and Person)  Thought Content: Logical   Suicidal Thoughts:  No  Homicidal Thoughts:  No  Memory:  Immediate;   Good Recent;   Good Remote;   Good  Judgement:  Good  Insight:  Good  Psychomotor Activity:  Normal  Concentration:  Concentration: Good and Attention Span: Good  Recall:  Good  Fund of Knowledge: Good  Language: Good  Akathisia:  No  Handed:  Right  AIMS (if indicated): not done  Assets:  Communication Skills Desire for Improvement Housing Resilience  ADL's:  Intact  Cognition: WNL  Sleep:  Fair   Screenings: PHQ2-9     Office Visit from 02/22/2018 in Primary Care at [place] Office Visit from 01/31/2018 in Primary Care at [facility] Visit from 12/11/2017 in Primary Care at [facility] Visit from 09/01/2017 in Primary Care at [facility] Visit from 03/07/2017 in Primary Care at [facility] Total Score  0  0  0  0  0       Assessment and Plan: Major depressive disorder, recurrent.  Attention deficit disorder, predominantly inattentive type.  Generalized anxiety disorder.  Patient experiencing focus and attention issues and wondering if she can try higher dose of Vyvanse.  She has no tremors or any shakes.  We discussed increased Vyvanse may cause worsening of anxiety but patient like to try anyway.  Recommended to try Vyvanse 40 mg however recommended if she started to have insomnia and anxiety got worse and she need to call us immediately.  I will continue Zoloft 200 mg daily, trazodone 100 mg half to 1 tablet as needed for insomnia and a new prescription of Vyvanse 40 mg daily.  Healthy lifestyle and watch her calorie intake.  Discussed stimulant abuse, tolerance  and withdrawal.  Encouraged to keep appointment with her therapist Hale Bogus for CBT.  Recommended to call us back if is any  question or any concern.  Follow-up in 3 months.   Cleotis Nipper, MD 02/26/2018, 10:27 AM

## 2018-02-28 NOTE — Telephone Encounter (Signed)
What if she has a gastic sleeve and cannot use oral nsaids? Therefore, she needs a topical agent.  Can you ask about that?

## 2018-02-28 NOTE — Telephone Encounter (Signed)
They will only cover this if it is for osteoarthritis.

## 2018-03-03 ENCOUNTER — Telehealth: Payer: Self-pay | Admitting: Physician Assistant

## 2018-03-03 NOTE — Telephone Encounter (Signed)
Message sent to [place] re: fluconazole.

## 2018-03-03 NOTE — Telephone Encounter (Signed)
Pt has had a sore throat 3 times since August. Each time she is given an antibiotic she is also getting Diflucan. She would like [place] to send her fluconazole (DIFLUCAN) 150 MG tablet [[id number]] at Pharmacy:  [id number] - , [place] - [address] [facility] [address]. Please advise at [phone]

## 2018-03-05 NOTE — Telephone Encounter (Signed)
Please call pt. She will need to be seen for this as we did not see her for the sore throat and did not prescribe the antibiotics. Alternatively, she can contact the office where she received antibiotics last and ask them to provide her with diflucan.

## 2018-03-06 NOTE — Telephone Encounter (Signed)
Please advised patient    Please call pt. She will need to be seen for this as we did not see her for the sore throat and did not prescribe the antibiotics. Alternatively, she can contact the office where she received antibiotics last and ask them to provide her with diflucan.

## 2018-03-06 NOTE — Telephone Encounter (Signed)
Pt called back.  States she doesn't need the medication anymore.

## 2018-03-14 ENCOUNTER — Ambulatory Visit (INDEPENDENT_AMBULATORY_CARE_PROVIDER_SITE_OTHER): Payer: BLUE CROSS/BLUE SHIELD | Admitting: Family Medicine

## 2018-03-14 ENCOUNTER — Encounter: Payer: Self-pay | Admitting: Family Medicine

## 2018-03-14 VITALS — BP 149/99 | Ht 62.0 in | Wt 250.0 lb

## 2018-03-14 DIAGNOSIS — M79605 Pain in left leg: Secondary | ICD-10-CM

## 2018-03-14 MED ORDER — DICLOFENAC SODIUM 2 % TD SOLN: TRANSDERMAL | 2 refills | Status: DC

## 2018-03-14 NOTE — Patient Instructions (Signed)
You have IT band syndrome Avoid painful activities as much as possible. Ice over area of pain 3-4 times a day for 15 minutes at a time Hip side raise exercise 3 sets of 10 once a day - add weights if this becomes too easy. Stretches - pick 2-3 and hold for 20-30 seconds x 3 - do once or twice a day. Start physical therapy and do the home exercises on days you don't go to therapy. Pennsaid topically as directed twice a day. Tylenol 500mg  1-2 tabs three times a day as needed for pain. Follow up with me in 6 weeks.

## 2018-03-15 ENCOUNTER — Other Ambulatory Visit (HOSPITAL_COMMUNITY): Payer: Self-pay | Admitting: Psychiatry

## 2018-03-15 ENCOUNTER — Encounter: Payer: Self-pay | Admitting: Family Medicine

## 2018-03-15 DIAGNOSIS — F330 Major depressive disorder, recurrent, mild: Secondary | ICD-10-CM

## 2018-03-15 NOTE — Progress Notes (Signed)
PCP: Georgina Quint, MD Consultation requested by Benjiman Core PA-C  Subjective:   HPI: Patient is a 37 y.o. female here for left leg pain.  Patient reports she's had problems with lateral left leg since September. Felt like she hurt herself during zumba but felt worse the next day. Bothers more with stairs hip into knee, sharp. No numbness or skin changes. Tried ibuprofen, icyhot with mild benefit. Radiographs were negative.  Past Medical History:  Diagnosis Date  . Abdominal distention   . Abdominal pain   . Anxiety   . Blood in stool   . Constipation   . Diverticulitis    Per pt. States she never had CT scan to confirm this  . Diverticulosis    PT HOSPITALIZED AT [facility] October 03, 2011  . Gestational hypertension 07/05/2013  . Gestational hypertension 07/05/2013  . History of chicken pox    childhood  . History of hiatal hernia   . Hypertension    During pregnancy only  . Maternal obesity, antepartum 07/05/2013  . Maternal obesity, antepartum 07/05/2013  . Numbness    first two fingers right hand   . Rectal bleeding   . Shortness of breath dyspnea    with exercise  . Umbilical hernia    planning surgical repair    Current Outpatient Medications on File Prior to Visit  Medication Sig Dispense Refill  . acetaminophen (TYLENOL) 500 MG tablet Take 500 mg by mouth every 6 (six) hours as needed for moderate pain. Reported on 07/20/2015    . CALCIUM PO Take 1 tablet daily by mouth.    . cetirizine (ZYRTEC) 10 MG tablet Take 10 mg by mouth daily as needed for allergies. Reported on 07/20/2015    . CRYSELLE-28 0.3-30 MG-MCG tablet Take 1 tablet by mouth daily. Reported on 07/20/2015  12  . lisdexamfetamine (VYVANSE) 40 MG capsule Take 1 capsule (40 mg total) by mouth daily. 30 capsule 0  . LYSINE HCL PO Take 1 tablet daily by mouth.    . Probiotic Product (PROBIOTIC COLON SUPPORT PO) Take 1 tablet by mouth daily.    . pseudoephedrine (SUDAFED) 30 MG tablet Take 30 mg  every 4 (four) hours as needed by mouth for congestion.    . sertraline (ZOLOFT) 100 MG tablet Take 1 tablet (100 mg total) by mouth at bedtime. 90 tablet 0  . traZODone (DESYREL) 100 MG tablet Take 1 tablet (100 mg total) by mouth at bedtime. 90 tablet 0   No current facility-administered medications on file prior to visit.     Past Surgical History:  Procedure Laterality Date  . BREATH TEK H PYLORI N/A 07/22/2014   Procedure: BREATH TEK H PYLORI;  Surgeon: Ovidio Kin, MD;  Location: [facility];  Service: General;  Laterality: N/A;  . CESAREAN SECTION  05/28/09  . CESAREAN SECTION N/A 07/05/2013   Procedure: REPEAT CESAREAN SECTION (With Special Wound Vac);  Surgeon: Robley Fries, MD;  Location: [facility];  Service: Obstetrics;  Laterality: N/A;   EDD: 07/23/13  . COLONOSCOPY  10/07/2011   Procedure: COLONOSCOPY;  Surgeon: Theda Belfast, MD;  Location: [facility];  Service: Endoscopy;  Laterality: N/A;  . ESOPHAGOGASTRODUODENOSCOPY  10/07/2011   Procedure: ESOPHAGOGASTRODUODENOSCOPY (EGD);  Surgeon: Theda Belfast, MD;  Location: [facility];  Service: Endoscopy;  Laterality: N/A;  . LAPAROSCOPIC GASTRIC SLEEVE RESECTION WITH HIATAL HERNIA REPAIR N/A 11/17/2014   Procedure: LAPAROSCOPIC GASTRIC SLEEVE RESECTION ;  Surgeon: Ovidio Kin, MD;  Location: [facility];  Service: General;  Laterality: N/A;  . SCAR REVISION  09/17/09   c-section scar revision  . UMBILICAL HERNIA REPAIR  11/01/2011   Procedure: HERNIA REPAIR UMBILICAL ADULT;  Surgeon: Velora Hecklerodd M Gerkin, MD;  Location: [facility];  Service: General;  Laterality: N/A;    No Known Allergies  Social History   Socioeconomic History  . Marital status: Married    Spouse name: Not on file  . Number of children: 2  . Years of education: Not on file  . Highest education level: Not on file  Occupational History  . Not on file  Social Needs  . Financial resource strain: Not on file  . Food insecurity:    Worry: Not on file    Inability: Not  on file  . Transportation needs:    Medical: Not on file    Non-medical: Not on file  Tobacco Use  . Smoking status: Never Smoker  . Smokeless tobacco: Never Used  Substance and Sexual Activity  . Alcohol use: Yes    Alcohol/week: 0.0 standard drinks    Comment: occ  . Drug use: No  . Sexual activity: Yes    Partners: Male    Birth control/protection: Pill  Lifestyle  . Physical activity:    Days per week: Not on file    Minutes per session: Not on file  . Stress: Not on file  Relationships  . Social connections:    Talks on phone: Not on file    Gets together: Not on file    Attends religious service: Not on file    Active member of club or organization: Not on file    Attends meetings of clubs or organizations: Not on file    Relationship status: Not on file  . Intimate partner violence:    Fear of current or ex partner: Not on file    Emotionally abused: Not on file    Physically abused: Not on file    Forced sexual activity: Not on file  Other Topics Concern  . Not on file  Social History Narrative  . Not on file    Family History  Problem Relation Age of Onset  . Diabetes Mother   . Hypertension Mother   . Ulcerative colitis Father   . Hypertension Father   . Cancer Maternal Grandmother        lung    BP (!) 149/99   Ht 5\' 2"  (1.575 m)   Wt 250 lb (113.4 kg)   BMI 45.73 kg/m   Review of Systems: See HPI above.     Objective:  Physical Exam:  Gen: NAD, comfortable in exam room  Left hip: No deformity. FROM with 5/5 strength. TTP throughout IT band.  No other tenderness. NVI distally. Negative logroll.  Left knee: No gross deformity, ecchymoses, effusion. No joint line tenderness. FROM with 5/5 strength. Negative ant/post drawers. Negative valgus/varus testing. Negative lachmans. Negative mcmurrays, apleys, patellar apprehension. NV intact distally.  Right leg: No deformity. FROM with 5/5 strength. No tenderness to palpation. NVI  distally.   Assessment & Plan:  1. Left leg pain - 2/2 IT band syndrome.  Start physical therapy and home exercises - voltaren gel was denied so sent in pennsaid.  Tylenol as needed.  Home exercises, icing.  F/u in 6 weeks.

## 2018-03-19 DIAGNOSIS — R948 Abnormal results of function studies of other organs and systems: Secondary | ICD-10-CM | POA: Diagnosis not present

## 2018-03-19 DIAGNOSIS — R635 Abnormal weight gain: Secondary | ICD-10-CM | POA: Diagnosis not present

## 2018-03-19 DIAGNOSIS — Z6841 Body Mass Index (BMI) 40.0 and over, adult: Secondary | ICD-10-CM | POA: Diagnosis not present

## 2018-03-29 DIAGNOSIS — B372 Candidiasis of skin and nail: Secondary | ICD-10-CM | POA: Diagnosis not present

## 2018-03-29 DIAGNOSIS — Z6841 Body Mass Index (BMI) 40.0 and over, adult: Secondary | ICD-10-CM | POA: Diagnosis not present

## 2018-03-29 DIAGNOSIS — G4700 Insomnia, unspecified: Secondary | ICD-10-CM | POA: Diagnosis not present

## 2018-04-12 ENCOUNTER — Telehealth (HOSPITAL_COMMUNITY): Payer: Self-pay

## 2018-04-12 DIAGNOSIS — F900 Attention-deficit hyperactivity disorder, predominantly inattentive type: Secondary | ICD-10-CM

## 2018-04-12 MED ORDER — LISDEXAMFETAMINE DIMESYLATE 40 MG PO CAPS: 40.0000 mg | ORAL_CAPSULE | Freq: Every day | ORAL | 0 refills | Status: DC

## 2018-04-12 NOTE — Telephone Encounter (Signed)
Patient was last seen on 11/4 and returns in February, she is calling for a refill on her Vyvanse. Patient uses [facility] on [address]

## 2018-04-12 NOTE — Telephone Encounter (Signed)
[facility] [address].

## 2018-04-16 DIAGNOSIS — Z713 Dietary counseling and surveillance: Secondary | ICD-10-CM | POA: Diagnosis not present

## 2018-04-16 DIAGNOSIS — Z6841 Body Mass Index (BMI) 40.0 and over, adult: Secondary | ICD-10-CM | POA: Diagnosis not present

## 2018-04-22 ENCOUNTER — Encounter (HOSPITAL_COMMUNITY): Payer: Self-pay | Admitting: Emergency Medicine

## 2018-04-22 ENCOUNTER — Other Ambulatory Visit: Payer: Self-pay

## 2018-04-22 ENCOUNTER — Ambulatory Visit (HOSPITAL_COMMUNITY)
Admission: EM | Admit: 2018-04-22 | Discharge: 2018-04-22 | Disposition: A | Discharge status: Home / Self Care | Payer: BLUE CROSS/BLUE SHIELD | Attending: Family Medicine | Admitting: Family Medicine

## 2018-04-22 DIAGNOSIS — J020 Streptococcal pharyngitis: Secondary | ICD-10-CM | POA: Insufficient documentation

## 2018-04-22 LAB — POCT RAPID STREP A: Streptococcus, Group A Screen (Direct): POSITIVE — AB

## 2018-04-22 MED ORDER — FLUCONAZOLE 150 MG PO TABS: 150.0000 mg | ORAL_TABLET | Freq: Once | ORAL | 6 refills | Status: AC

## 2018-04-22 MED ORDER — AMOXICILLIN 875 MG PO TABS: 875.0000 mg | ORAL_TABLET | Freq: Two times a day (BID) | ORAL | 5 refills | Status: DC

## 2018-04-22 MED ORDER — ACETAMINOPHEN 325 MG PO TABS
650.0000 mg | ORAL_TABLET | Freq: Once | ORAL | Status: AC
  Administered 2018-04-22: 650 mg via ORAL

## 2018-04-22 MED ORDER — ACETAMINOPHEN 325 MG PO TABS
ORAL_TABLET | ORAL | Status: AC
  Filled 2018-04-22: qty 2

## 2018-04-22 MED ORDER — ONDANSETRON 8 MG PO TBDP: 8.0000 mg | ORAL_TABLET | Freq: Three times a day (TID) | ORAL | 5 refills | Status: DC | PRN

## 2018-04-22 MED ORDER — CEFDINIR 300 MG PO CAPS: 600.0000 mg | ORAL_CAPSULE | Freq: Every day | ORAL | 0 refills | Status: DC

## 2018-04-22 NOTE — ED Provider Notes (Signed)
[facility]    CSN: [id number] Arrival date & time: 04/22/18  1013     History   Chief Complaint Chief Complaint  Patient presents with  . Sore Throat    HPI Kynzley R Vogler is a 37 y.o. female.   Is a 37 year old woman who comes in with 1 week of sore throat.  She is an established patient here. [facility] a [facility] of a sore throat x 1 week. The patient denied any fever at home.  Patient continues to have recurrent strep.  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  She is seen the ear nose and throat doctor who told her that if her tonsils removed, she would have the first 6 weeks of her life.  Patient is a little hesitant to have the worst 6 weeks of her life at this point.     Past Medical History:  Diagnosis Date  . Abdominal distention   . Abdominal pain   . Anxiety   . Blood in stool   . Constipation   . Diverticulitis    Per pt. States she never had CT scan to confirm this  . Diverticulosis    [facility]  . Gestational hypertension 07/05/2013  . Gestational hypertension 07/05/2013  . History of chicken pox    childhood  . History of hiatal hernia   . Hypertension    During pregnancy only  . Maternal obesity, antepartum 07/05/2013  . Maternal obesity, antepartum 07/05/2013  . Numbness    first two fingers right hand   . Rectal bleeding   . Shortness of breath dyspnea    with exercise  . Umbilical hernia    planning surgical repair    Patient Active Problem List   Diagnosis Date Noted  . Sore throat 12/11/2017  . Streptococcal pharyngitis 12/11/2017  . Morbid obesity (HCC) 11/17/2014  . Atypical chest pain 07/17/2013  . Diverticular disease 10/23/2012  . Umbilical hernia 10/26/2011  . Anxiety 07/26/2011    Past Surgical History:  Procedure Laterality Date  . BREATH TEK H PYLORI N/A 07/22/2014   Procedure: BREATH TEK H PYLORI;  Surgeon: Ovidio Kinavid Newman, MD;  [facility];  Service: General;  Laterality: N/A;  . CESAREAN SECTION  05/28/09  . CESAREAN SECTION N/A 07/05/2013   Procedure: REPEAT CESAREAN SECTION (With Special Wound Vac);  Surgeon: Robley FriesVaishali R Mody, MD;  Location: [facility];  Service: Obstetrics;  Laterality: N/A;   EDD: 07/23/13  . COLONOSCOPY  10/07/2011   Procedure: COLONOSCOPY;  Surgeon: Theda BelfastPatrick D Hung, MD;  Location: [facility];  Service: Endoscopy;  Laterality: N/A;  . ESOPHAGOGASTRODUODENOSCOPY  10/07/2011   Procedure: ESOPHAGOGASTRODUODENOSCOPY (EGD);  Surgeon: Theda BelfastPatrick D Hung, MD;  [facility];  Service: Endoscopy;  Laterality: N/A;  . LAPAROSCOPIC GASTRIC SLEEVE RESECTION WITH HIATAL HERNIA REPAIR N/A 11/17/2014   Procedure: LAPAROSCOPIC GASTRIC SLEEVE RESECTION ;  Surgeon: Ovidio Kinavid Newman, MD;  Location: [facility];  Service: General;  Laterality: N/A;  . SCAR REVISION  09/17/09   c-section scar revision  . UMBILICAL HERNIA REPAIR  11/01/2011   Procedure: HERNIA REPAIR UMBILICAL ADULT;  Surgeon: Velora Hecklerodd M Gerkin, MD;  Location: [facility];  Service: General;  Laterality: N/A;    OB History    Gravida  2   Para  2   Term  2   Preterm      AB      Living  2  SAB      TAB      Ectopic      Multiple      Live Births  2            Home Medications    Prior to Admission medications   Medication Sig Start Date End Date Taking? Authorizing Provider  acetaminophen (TYLENOL) 500 MG tablet Take 500 mg by mouth every 6 (six) hours as needed for moderate pain. Reported on 07/20/2015    [provider]  amoxicillin (AMOXIL) 875 MG tablet Take 1 tablet (875 mg total) by mouth 2 (two) times daily. 04/22/18   Elvina Sidle, MD  CALCIUM PO Take 1 tablet daily by mouth.    [provider]  cefdinir (OMNICEF) 300 MG capsule Take 2 capsules (600 mg total) by mouth daily. 04/22/18   Elvina Sidle, MD  cetirizine (ZYRTEC) 10 MG tablet Take 10 mg by mouth daily as needed for allergies. Reported on 07/20/2015     [provider]  CRYSELLE-28 0.3-30 MG-MCG tablet Take 1 tablet by mouth daily. Reported on 07/20/2015 10/30/14   [provider]  Diclofenac Sodium 2 % SOLN 2 pumps bid to affected area 03/14/18   Hudnall, Azucena Fallen, MD  fluconazole (DIFLUCAN) 150 MG tablet Take 1 tablet (150 mg total) by mouth once for 1 dose. Repeat if needed 04/22/18 04/22/18  Elvina Sidle, MD  lisdexamfetamine (VYVANSE) 40 MG capsule Take 1 capsule (40 mg total) by mouth daily. 04/12/18   Arfeen, Phillips Grout, MD  LYSINE HCL PO Take 1 tablet daily by mouth.    [provider]  ondansetron (ZOFRAN-ODT) 8 MG disintegrating tablet Take 1 tablet (8 mg total) by mouth every 8 (eight) hours as needed for nausea. 04/22/18   Elvina Sidle, MD  Probiotic Product (PROBIOTIC COLON SUPPORT PO) Take 1 tablet by mouth daily.    [provider]  pseudoephedrine (SUDAFED) 30 MG tablet Take 30 mg every 4 (four) hours as needed by mouth for congestion.    [provider]  sertraline (ZOLOFT) 100 MG tablet Take 1 tablet (100 mg total) by mouth at bedtime. 02/26/18   Arfeen, Phillips Grout, MD  traZODone (DESYREL) 100 MG tablet Take 1 tablet (100 mg total) by mouth at bedtime. 02/26/18   Arfeen, Phillips Grout, MD    Family History Family History  Problem Relation Age of Onset  . Diabetes Mother   . Hypertension Mother   . Ulcerative colitis Father   . Hypertension Father   . Cancer Maternal Grandmother        lung    Social History Social History   Tobacco Use  . Smoking status: Never Smoker  . Smokeless tobacco: Never Used  Substance Use Topics  . Alcohol use: Yes    Alcohol/week: 0.0 standard drinks    Comment: occ  . Drug use: No     Allergies   Patient has no known allergies.   Review of Systems Review of Systems   Physical Exam Triage Vital Signs ED Triage Vitals [04/22/18 1052]  Enc Vitals Group     BP      Pulse      Resp      Temp      Temp src      SpO2      Weight       Height      Head Circumference      Peak Flow      Pain Score 7  Pain Loc      Pain Edu?      Excl. in GC?    No data found.  Updated Vital Signs LMP 04/08/2018    Physical Exam Vitals signs and nursing note reviewed.  Constitutional:      Appearance: She is well-developed. She is obese.  HENT:     Head: Normocephalic.     Right Ear: Tympanic membrane normal.     Left Ear: Tympanic membrane normal.     Mouth/Throat:     Mouth: Mucous membranes are moist.     Pharynx: Oropharyngeal exudate and posterior oropharyngeal erythema present.     Tonsils: Tonsillar exudate present. No tonsillar abscesses. Swelling: 1+ on the right. 1+ on the left.  Eyes:     Conjunctiva/sclera: Conjunctivae normal.  Neck:     Musculoskeletal: Normal range of motion and neck supple.  Skin:    General: Skin is warm and dry.  Neurological:     General: No focal deficit present.     Mental Status: She is alert.      UC Treatments / Results  Labs (all labs ordered are listed, but only abnormal results are displayed) Labs Reviewed  POCT RAPID STREP A - Abnormal; Notable for the following components:      Result Value   Streptococcus, Group A Screen (Direct) POSITIVE (*)    All other components within normal limits    EKG None  Radiology No results found.  Procedures Procedures (including critical care time)  Medications Ordered in UC Medications  acetaminophen (TYLENOL) tablet 650 mg (650 mg Oral Given 04/22/18 1059)    Initial Impression / Assessment and Plan / UC Course  I have reviewed the triage vital signs and the nursing notes.  Pertinent labs & imaging results that were available during my care of the patient were reviewed by me and considered in my medical decision making (see chart for details).    Final Clinical Impressions(s) / UC Diagnoses   Final diagnoses:  Streptococcal sore throat   Discharge Instructions   None    ED Prescriptions    Medication Sig  Dispense Auth. Provider   cefdinir (OMNICEF) 300 MG capsule Take 2 capsules (600 mg total) by mouth daily. 20 capsule Elvina SidleLauenstein, Bernita Beckstrom, MD   fluconazole (DIFLUCAN) 150 MG tablet Take 1 tablet (150 mg total) by mouth once for 1 dose. Repeat if needed 5 tablet Elvina SidleLauenstein, Jessel Gettinger, MD   ondansetron (ZOFRAN-ODT) 8 MG disintegrating tablet Take 1 tablet (8 mg total) by mouth every 8 (eight) hours as needed for nausea. 12 tablet Elvina SidleLauenstein, Marline Morace, MD   amoxicillin (AMOXIL) 875 MG tablet Take 1 tablet (875 mg total) by mouth 2 (two) times daily. 20 tablet Elvina SidleLauenstein, Tristy Udovich, MD     Controlled Substance Prescriptions [place] Controlled Substance Registry consulted? Not Applicable   Elvina SidleLauenstein, Wrigley Winborne, MD 04/22/18 1123

## 2018-04-22 NOTE — ED Triage Notes (Signed)
[facility] a [facility] of [facility] sore throat x 1 week. The patient denied any fever at home.

## 2018-04-26 ENCOUNTER — Emergency Department (HOSPITAL_COMMUNITY)
Admission: EM | Admit: 2018-04-26 | Discharge: 2018-04-27 | Disposition: A | Discharge status: Home / Self Care | Payer: BLUE CROSS/BLUE SHIELD | Attending: Emergency Medicine | Admitting: Emergency Medicine

## 2018-04-26 ENCOUNTER — Other Ambulatory Visit: Payer: Self-pay

## 2018-04-26 ENCOUNTER — Encounter (HOSPITAL_COMMUNITY): Payer: Self-pay | Admitting: *Deleted

## 2018-04-26 DIAGNOSIS — Z6841 Body Mass Index (BMI) 40.0 and over, adult: Secondary | ICD-10-CM | POA: Diagnosis not present

## 2018-04-26 DIAGNOSIS — R1012 Left upper quadrant pain: Secondary | ICD-10-CM | POA: Diagnosis not present

## 2018-04-26 DIAGNOSIS — Z79899 Other long term (current) drug therapy: Secondary | ICD-10-CM | POA: Diagnosis not present

## 2018-04-26 DIAGNOSIS — R1013 Epigastric pain: Secondary | ICD-10-CM | POA: Diagnosis not present

## 2018-04-26 DIAGNOSIS — Z713 Dietary counseling and surveillance: Secondary | ICD-10-CM | POA: Diagnosis not present

## 2018-04-26 LAB — URINALYSIS, ROUTINE W REFLEX MICROSCOPIC
Bilirubin Urine: NEGATIVE
Glucose, UA: NEGATIVE mg/dL
Ketones, ur: NEGATIVE mg/dL
Leukocytes, UA: NEGATIVE
Nitrite: NEGATIVE
Protein, ur: NEGATIVE mg/dL
Specific Gravity, Urine: 1.005 (ref 1.005–1.030)
pH: 6 (ref 5.0–8.0)

## 2018-04-26 LAB — LIPASE, BLOOD: Lipase: 30 U/L (ref 11–51)

## 2018-04-26 LAB — CBC
HCT: 41.3 % (ref 36.0–46.0)
Hemoglobin: 13.3 g/dL (ref 12.0–15.0)
MCH: 29.9 pg (ref 26.0–34.0)
MCHC: 32.2 g/dL (ref 30.0–36.0)
MCV: 92.8 fL (ref 80.0–100.0)
Platelets: 360 10*3/uL (ref 150–400)
RBC: 4.45 MIL/uL (ref 3.87–5.11)
RDW: 12.2 % (ref 11.5–15.5)
WBC: 10.9 10*3/uL — ABNORMAL HIGH (ref 4.0–10.5)
nRBC: 0 % (ref 0.0–0.2)

## 2018-04-26 LAB — COMPREHENSIVE METABOLIC PANEL
ALT: 31 U/L (ref 0–44)
AST: 26 U/L (ref 15–41)
Albumin: 3.5 g/dL (ref 3.5–5.0)
Alkaline Phosphatase: 43 U/L (ref 38–126)
Anion gap: 9 (ref 5–15)
BUN: 10 mg/dL (ref 6–20)
CO2: 25 mmol/L (ref 22–32)
Calcium: 8.7 mg/dL — ABNORMAL LOW (ref 8.9–10.3)
Chloride: 104 mmol/L (ref 98–111)
Creatinine, Ser: 0.61 mg/dL (ref 0.44–1.00)
GFR calc Af Amer: >60 mL/min (ref >60)
GFR calc non Af Amer: >60 mL/min (ref >60)
Glucose, Bld: 105 mg/dL — ABNORMAL HIGH (ref 70–99)
Potassium: 3.6 mmol/L (ref 3.5–5.1)
Sodium: 138 mmol/L (ref 135–145)
Total Bilirubin: 0.3 mg/dL (ref 0.3–1.2)
Total Protein: 7.5 g/dL (ref 6.5–8.1)

## 2018-04-26 LAB — I-STAT BETA HCG BLOOD, ED (MC, WL, AP ONLY): I-stat hCG, quantitative: <5 m[IU]/mL (ref <5)

## 2018-04-26 MED ORDER — SODIUM CHLORIDE 0.9 % IV BOLUS
1000.0000 mL | Freq: Once | INTRAVENOUS | Status: AC
  Administered 2018-04-26: 1000 mL via INTRAVENOUS

## 2018-04-26 MED ORDER — CIPROFLOXACIN HCL 500 MG PO TABS: 500.0000 mg | ORAL_TABLET | Freq: Two times a day (BID) | ORAL | 0 refills | Status: DC

## 2018-04-26 MED ORDER — ONDANSETRON HCL 4 MG/2ML IJ SOLN
4.0000 mg | Freq: Once | INTRAMUSCULAR | Status: AC
  Administered 2018-04-26: 4 mg via INTRAVENOUS
  Filled 2018-04-26: qty 2

## 2018-04-26 MED ORDER — METRONIDAZOLE 500 MG PO TABS: 500.0000 mg | ORAL_TABLET | Freq: Two times a day (BID) | ORAL | 0 refills | Status: DC

## 2018-04-26 MED ORDER — ONDANSETRON HCL 4 MG PO TABS: 4.0000 mg | ORAL_TABLET | Freq: Three times a day (TID) | ORAL | 0 refills | Status: DC | PRN

## 2018-04-26 MED ORDER — OXYCODONE-ACETAMINOPHEN 5-325 MG PO TABS: 1.0000 | ORAL_TABLET | ORAL | 0 refills | Status: DC | PRN

## 2018-04-26 MED ORDER — FENTANYL CITRATE (PF) 100 MCG/2ML IJ SOLN
50.0000 ug | INTRAMUSCULAR | Status: DC | PRN
  Administered 2018-04-26: 50 ug via INTRAVENOUS
  Filled 2018-04-26: qty 2

## 2018-04-26 NOTE — ED Notes (Signed)
Bed: JA25 Expected date:  Expected time:  Means of arrival:  Comments: [place]

## 2018-04-26 NOTE — ED Provider Notes (Signed)
[facility]-EMERGENCY DEPT Provider Note   CSN: [id number] Arrival date & time: 04/26/18  1916     History   Chief Complaint Chief Complaint  Patient presents with  . Abdominal Pain    HPI Ann Santos is a 38 y.o. female.  Who presents emergency department chief complaint of left upper quadrant abdominal pain.  She has a history of recurrent diverticulitis.  Patient states that she began having pain in her left upper quadrant yesterday.  She thought she might have some constipation or gas and she took Maalox, Tylenol and ibuprofen however she has had worsening pain.  She states that it feels the same as her previous episodes of diverticulitis.  She denies any vomiting but has had some mild nausea.  She has been somewhat constipated recently.  She denies urinary symptoms, flank pain, fevers.  HPI  Past Medical History:  Diagnosis Date  . Abdominal distention   . Abdominal pain   . Anxiety   . Blood in stool   . Constipation   . Diverticulitis    Per pt. States she never had CT scan to confirm this  . Diverticulosis    PT HOSPITALIZED AT [facility] October 03, 2011  . Gestational hypertension 07/05/2013  . Gestational hypertension 07/05/2013  . History of chicken pox    childhood  . History of hiatal hernia   . Hypertension    During pregnancy only  . Maternal obesity, antepartum 07/05/2013  . Maternal obesity, antepartum 07/05/2013  . Numbness    first two fingers right hand   . Rectal bleeding   . Shortness of breath dyspnea    with exercise  . Umbilical hernia    planning surgical repair    Patient Active Problem List   Diagnosis Date Noted  . Sore throat 12/11/2017  . Streptococcal pharyngitis 12/11/2017  . Morbid obesity (HCC) 11/17/2014  . Atypical chest pain 07/17/2013  . Diverticular disease 10/23/2012  . Umbilical hernia 10/26/2011  . Anxiety 07/26/2011    Past Surgical History:  Procedure Laterality Date  . BREATH TEK H PYLORI N/A  07/22/2014   Procedure: BREATH TEK H PYLORI;  Surgeon: Ovidio Kin, MD;  Location: [facility];  Service: General;  Laterality: N/A;  . CESAREAN SECTION  05/28/09  . CESAREAN SECTION N/A 07/05/2013   Procedure: REPEAT CESAREAN SECTION (With Special Wound Vac);  Surgeon: Robley Fries, MD;  Location: [facility];  Service: Obstetrics;  Laterality: N/A;   EDD: 07/23/13  . COLONOSCOPY  10/07/2011   Procedure: COLONOSCOPY;  Surgeon: Theda Belfast, MD;  Location: [facility];  Service: Endoscopy;  Laterality: N/A;  . ESOPHAGOGASTRODUODENOSCOPY  10/07/2011   Procedure: ESOPHAGOGASTRODUODENOSCOPY (EGD);  Surgeon: Theda Belfast, MD;  Location: [facility];  Service: Endoscopy;  Laterality: N/A;  . LAPAROSCOPIC GASTRIC SLEEVE RESECTION WITH HIATAL HERNIA REPAIR N/A 11/17/2014   Procedure: LAPAROSCOPIC GASTRIC SLEEVE RESECTION ;  Surgeon: Ovidio Kin, MD;  Location: [facility];  Service: General;  Laterality: N/A;  . SCAR REVISION  09/17/09   c-section scar revision  . UMBILICAL HERNIA REPAIR  11/01/2011   Procedure: HERNIA REPAIR UMBILICAL ADULT;  Surgeon: Velora Heckler, MD;  Location: [facility];  Service: General;  Laterality: N/A;     OB History    Gravida  2   Para  2   Term  2   Preterm      AB      Living  2     SAB  TAB      Ectopic      Multiple      Live Births  2            Home Medications    Prior to Admission medications   Medication Sig Start Date End Date Taking? Authorizing Provider  acetaminophen (TYLENOL) 500 MG tablet Take 500 mg by mouth every 6 (six) hours as needed for moderate pain. Reported on 07/20/2015   Yes [provider]  CALCIUM PO Take 1 tablet daily by mouth.   Yes [provider]  cefdinir (OMNICEF) 300 MG capsule Take 2 capsules (600 mg total) by mouth daily. 04/22/18  Yes Elvina SidleLauenstein, Kurt, MD  cetirizine (ZYRTEC) 10 MG tablet Take 10 mg by mouth daily as needed for allergies. Reported on 07/20/2015   Yes [provider]    CRYSELLE-28 0.3-30 MG-MCG tablet Take 1 tablet by mouth daily. Reported on 07/20/2015 10/30/14  Yes [provider]  lisdexamfetamine (VYVANSE) 40 MG capsule Take 1 capsule (40 mg total) by mouth daily. 04/12/18  Yes Arfeen, Phillips GroutSyed T, MD  ondansetron (ZOFRAN-ODT) 8 MG disintegrating tablet Take 1 tablet (8 mg total) by mouth every 8 (eight) hours as needed for nausea. 04/22/18  Yes Elvina SidleLauenstein, Kurt, MD  Probiotic Product (PROBIOTIC COLON SUPPORT PO) Take 1 tablet by mouth daily.   Yes [provider]  pseudoephedrine (SUDAFED) 30 MG tablet Take 30 mg every 4 (four) hours as needed by mouth for congestion.   Yes [provider]  sertraline (ZOLOFT) 100 MG tablet Take 1 tablet (100 mg total) by mouth at bedtime. 02/26/18  Yes Arfeen, Phillips GroutSyed T, MD  traZODone (DESYREL) 100 MG tablet Take 1 tablet (100 mg total) by mouth at bedtime. 02/26/18  Yes Arfeen, Phillips GroutSyed T, MD  amoxicillin (AMOXIL) 875 MG tablet Take 1 tablet (875 mg total) by mouth 2 (two) times daily. 04/22/18   Elvina SidleLauenstein, Kurt, MD  ciprofloxacin (CIPRO) 500 MG tablet Take 1 tablet (500 mg total) by mouth 2 (two) times daily. One po bid x 7 days 04/26/18   Arthor CaptainHarris, Lavita Pontius, PA-C  Diclofenac Sodium 2 % SOLN 2 pumps bid to affected area Patient not taking: Reported on 04/26/2018 03/14/18   Lenda KelpHudnall, Shane R, MD  metroNIDAZOLE (FLAGYL) 500 MG tablet Take 1 tablet (500 mg total) by mouth 2 (two) times daily. One po bid x 7 days 04/26/18   Arthor CaptainHarris, Dezman Granda, PA-C  ondansetron (ZOFRAN) 4 MG tablet Take 1 tablet (4 mg total) by mouth every 8 (eight) hours as needed for nausea or vomiting. 04/26/18   Arthor CaptainHarris, Arionna Hoggard, PA-C  oxyCODONE-acetaminophen (PERCOCET) 5-325 MG tablet Take 1-2 tablets by mouth every 4 (four) hours as needed. 04/26/18   Arthor CaptainHarris, Mackie Goon, PA-C    Family History Family History  Problem Relation Age of Onset  . Diabetes Mother   . Hypertension Mother   . Ulcerative colitis Father   . Hypertension Father   . Cancer Maternal  Grandmother        lung    Social History Social History   Tobacco Use  . Smoking status: Never Smoker  . Smokeless tobacco: Never Used  Substance Use Topics  . Alcohol use: Yes    Alcohol/week: 0.0 standard drinks    Comment: occ  . Drug use: No     Allergies   Patient has no known allergies.   Review of Systems Review of Systems  Ten systems reviewed and are negative for acute change, except as noted in the  HPI.   Physical Exam Updated Vital Signs BP (!) 153/87   Pulse 74   Temp 98.8 F (37.1 C) (Oral)   Resp 18   Ht 5\' 2"  (1.575 m)   Wt 112.5 kg   LMP 04/08/2018   SpO2 94%   BMI 45.36 kg/m   Physical Exam Vitals signs and nursing note reviewed.  Constitutional:      General: She is not in acute distress.    Appearance: She is well-developed. She is not diaphoretic.  HENT:     Head: Normocephalic and atraumatic.  Eyes:     General: No scleral icterus.    Conjunctiva/sclera: Conjunctivae normal.  Neck:     Musculoskeletal: Normal range of motion.  Cardiovascular:     Rate and Rhythm: Normal rate and regular rhythm.     Heart sounds: Normal heart sounds. No murmur. No friction rub. No gallop.   Pulmonary:     Effort: Pulmonary effort is normal. No respiratory distress.     Breath sounds: Normal breath sounds.  Abdominal:     General: Bowel sounds are normal. There is no distension.     Palpations: Abdomen is soft. There is no mass.     Tenderness: There is abdominal tenderness in the epigastric area, left upper quadrant and left lower quadrant. There is no right CVA tenderness, left CVA tenderness or guarding.  Skin:    General: Skin is warm and dry.  Neurological:     Mental Status: She is alert and oriented to person, place, and time.  Psychiatric:        Behavior: Behavior normal.      ED Treatments / Results  Labs (all labs ordered are listed, but only abnormal results are displayed) Labs Reviewed  COMPREHENSIVE METABOLIC PANEL -  Abnormal; Notable for the following components:      Result Value   Glucose, Bld 105 (*)    Calcium 8.7 (*)    All other components within normal limits  CBC - Abnormal; Notable for the following components:   WBC 10.9 (*)    All other components within normal limits  URINALYSIS, ROUTINE W REFLEX MICROSCOPIC - Abnormal; Notable for the following components:   Color, Urine STRAW (*)    Hgb urine dipstick SMALL (*)    Bacteria, UA RARE (*)    All other components within normal limits  LIPASE, BLOOD  I-STAT BETA HCG BLOOD, ED ([facility], [facility], [facility] ONLY)    EKG None  Radiology No results found.  Procedures Procedures (including critical care time)  Medications Ordered in ED Medications  sodium chloride 0.9 % bolus 1,000 mL (0 mLs Intravenous Stopped 04/26/18 2352)  ondansetron (ZOFRAN) injection 4 mg (4 mg Intravenous Given 04/26/18 2248)     Initial Impression / Assessment and Plan / ED Course  I have reviewed the triage vital signs and the nursing notes.  Pertinent labs & imaging results that were available during my care of the patient were reviewed by me and considered in my medical decision making (see chart for details).     38 year old obese female with a history of recurrent diverticulitis.  Patient asks that we treat empirically with antibiotics and pain meds as she has had this repeatedly and does not wish to undergo CT scan and radiation.  Think this is reasonable.  She is not actively vomiting.  She has good outpatient follow-up and understands reasons to seek immediate medical care.  Patient appears appropriate for discharge at this time  Final Clinical Impressions(s) / ED Diagnoses   Final diagnoses:  LUQ abdominal pain    ED Discharge Orders         Ordered    oxyCODONE-acetaminophen (PERCOCET) 5-325 MG tablet  Every 4 hours PRN     04/26/18 2321    ondansetron (ZOFRAN) 4 MG tablet  Every 8 hours PRN     04/26/18 2321    ciprofloxacin (CIPRO) 500 MG tablet  2 times  daily     04/26/18 2321    metroNIDAZOLE (FLAGYL) 500 MG tablet  2 times daily     04/26/18 2321           Arthor Captain, PA-C 04/27/18 1610    Cathren Laine, MD 04/27/18 2113

## 2018-04-26 NOTE — Discharge Instructions (Signed)
Contact a health care provider if: °Your pain does not improve. °You have a hard time drinking or eating food. °Your bowel movements do not return to normal. °Get help right away if: °Your pain gets worse. °Your symptoms do not get better with treatment. °Your symptoms suddenly get worse. °You have a fever. °You vomit more than one time. °You have stools that are bloody, black, or tarry. °

## 2018-04-26 NOTE — ED Notes (Addendum)
Pt verbalized discharge instructions and follow up care. Alert and ambulatory. Husband is picking her up.

## 2018-04-26 NOTE — ED Triage Notes (Signed)
Pt stated "I've had belly pain x 2 days.  I feel like it's when I have diverticulitis.  I have been taking abx for strep throat.  I went to [facility] today for this weight loss thing and I told her about it.  She said if it was the diverticulitis, then maybe the abx would help."

## 2018-05-02 ENCOUNTER — Ambulatory Visit: Payer: Self-pay | Admitting: Family Medicine

## 2018-05-12 ENCOUNTER — Emergency Department (HOSPITAL_COMMUNITY)
Admission: EM | Admit: 2018-05-12 | Discharge: 2018-05-12 | Disposition: A | Discharge status: Home / Self Care | Payer: BLUE CROSS/BLUE SHIELD | Attending: Emergency Medicine | Admitting: Emergency Medicine

## 2018-05-12 ENCOUNTER — Encounter (HOSPITAL_COMMUNITY): Payer: Self-pay

## 2018-05-12 ENCOUNTER — Other Ambulatory Visit: Payer: Self-pay

## 2018-05-12 ENCOUNTER — Ambulatory Visit: Payer: Self-pay | Admitting: Family Medicine

## 2018-05-12 ENCOUNTER — Emergency Department (HOSPITAL_COMMUNITY): Payer: BLUE CROSS/BLUE SHIELD

## 2018-05-12 DIAGNOSIS — R102 Pelvic and perineal pain: Secondary | ICD-10-CM | POA: Insufficient documentation

## 2018-05-12 DIAGNOSIS — K439 Ventral hernia without obstruction or gangrene: Secondary | ICD-10-CM | POA: Diagnosis not present

## 2018-05-12 DIAGNOSIS — R1030 Lower abdominal pain, unspecified: Secondary | ICD-10-CM | POA: Diagnosis not present

## 2018-05-12 DIAGNOSIS — Z79899 Other long term (current) drug therapy: Secondary | ICD-10-CM | POA: Insufficient documentation

## 2018-05-12 DIAGNOSIS — K5792 Diverticulitis of intestine, part unspecified, without perforation or abscess without bleeding: Secondary | ICD-10-CM | POA: Diagnosis not present

## 2018-05-12 DIAGNOSIS — K5732 Diverticulitis of large intestine without perforation or abscess without bleeding: Secondary | ICD-10-CM | POA: Diagnosis not present

## 2018-05-12 LAB — URINALYSIS, ROUTINE W REFLEX MICROSCOPIC
Bilirubin Urine: NEGATIVE
Glucose, UA: NEGATIVE mg/dL
Hgb urine dipstick: NEGATIVE
Ketones, ur: NEGATIVE mg/dL
Nitrite: NEGATIVE
Protein, ur: NEGATIVE mg/dL
Specific Gravity, Urine: 1.03 (ref 1.005–1.030)
pH: 5 (ref 5.0–8.0)

## 2018-05-12 LAB — I-STAT BETA HCG BLOOD, ED (MC, WL, AP ONLY): I-stat hCG, quantitative: <5 m[IU]/mL (ref <5)

## 2018-05-12 LAB — COMPREHENSIVE METABOLIC PANEL
ALT: 25 U/L (ref 0–44)
AST: 26 U/L (ref 15–41)
Albumin: 3.8 g/dL (ref 3.5–5.0)
Alkaline Phosphatase: 38 U/L (ref 38–126)
Anion gap: 9 (ref 5–15)
BUN: 16 mg/dL (ref 6–20)
CO2: 25 mmol/L (ref 22–32)
Calcium: 8.9 mg/dL (ref 8.9–10.3)
Chloride: 105 mmol/L (ref 98–111)
Creatinine, Ser: 0.54 mg/dL (ref 0.44–1.00)
GFR calc Af Amer: >60 mL/min (ref >60)
GFR calc non Af Amer: >60 mL/min (ref >60)
Glucose, Bld: 108 mg/dL — ABNORMAL HIGH (ref 70–99)
Potassium: 3.8 mmol/L (ref 3.5–5.1)
Sodium: 139 mmol/L (ref 135–145)
Total Bilirubin: 0.8 mg/dL (ref 0.3–1.2)
Total Protein: 7.3 g/dL (ref 6.5–8.1)

## 2018-05-12 LAB — CBC
HCT: 41.5 % (ref 36.0–46.0)
Hemoglobin: 13.4 g/dL (ref 12.0–15.0)
MCH: 29.9 pg (ref 26.0–34.0)
MCHC: 32.3 g/dL (ref 30.0–36.0)
MCV: 92.6 fL (ref 80.0–100.0)
Platelets: 333 10*3/uL (ref 150–400)
RBC: 4.48 MIL/uL (ref 3.87–5.11)
RDW: 12.2 % (ref 11.5–15.5)
WBC: 10.6 10*3/uL — ABNORMAL HIGH (ref 4.0–10.5)
nRBC: 0 % (ref 0.0–0.2)

## 2018-05-12 LAB — LIPASE, BLOOD: Lipase: 31 U/L (ref 11–51)

## 2018-05-12 MED ORDER — SODIUM CHLORIDE (PF) 0.9 % IJ SOLN
INTRAMUSCULAR | Status: AC
  Filled 2018-05-12: qty 50

## 2018-05-12 MED ORDER — IOPAMIDOL (ISOVUE-300) INJECTION 61%
100.0000 mL | Freq: Once | INTRAVENOUS | Status: AC | PRN
  Administered 2018-05-12: 100 mL via INTRAVENOUS

## 2018-05-12 MED ORDER — MORPHINE SULFATE (PF) 4 MG/ML IV SOLN
4.0000 mg | Freq: Once | INTRAVENOUS | Status: AC
  Administered 2018-05-12: 4 mg via INTRAVENOUS
  Filled 2018-05-12: qty 1

## 2018-05-12 MED ORDER — FENTANYL CITRATE (PF) 100 MCG/2ML IJ SOLN
100.0000 ug | Freq: Once | INTRAMUSCULAR | Status: AC
  Administered 2018-05-12: 100 ug via INTRAVENOUS
  Filled 2018-05-12: qty 2

## 2018-05-12 MED ORDER — SODIUM CHLORIDE 0.9% FLUSH
3.0000 mL | Freq: Once | INTRAVENOUS | Status: AC
  Administered 2018-05-12: 3 mL via INTRAVENOUS

## 2018-05-12 MED ORDER — IOPAMIDOL (ISOVUE-300) INJECTION 61%
INTRAVENOUS | Status: AC
  Filled 2018-05-12: qty 100

## 2018-05-12 MED ORDER — HYDROCODONE-ACETAMINOPHEN 5-325 MG PO TABS: 1.0000 | ORAL_TABLET | Freq: Four times a day (QID) | ORAL | 0 refills | Status: DC | PRN

## 2018-05-12 MED ORDER — AMOXICILLIN-POT CLAVULANATE 875-125 MG PO TABS
1.0000 | ORAL_TABLET | Freq: Once | ORAL | Status: AC
  Administered 2018-05-12: 1 via ORAL
  Filled 2018-05-12: qty 1

## 2018-05-12 MED ORDER — AMOXICILLIN-POT CLAVULANATE 875-125 MG PO TABS: 1.0000 | ORAL_TABLET | Freq: Two times a day (BID) | ORAL | 0 refills | Status: DC

## 2018-05-12 NOTE — ED Provider Notes (Signed)
[facility]-EMERGENCY DEPT Provider Note   CSN: [id number] Arrival date & time: 05/12/18  0231     History   Chief Complaint Chief Complaint  Patient presents with  . Abdominal Pain    HPI Ann Santos is a 38 y.o. female possible history of anxiety, abdominal pain, diverticulosis, hiatal hernia who presents for evaluation of lower abdominal pain that is been ongoing for the last 5 days.  Patient reports that her pain is been Progressive worsening, prompting ED visit.  She reports she took tramadol at home with minimal improvement.  Patient was seen here on 04/26/18 for evaluation of abdominal pain.  At that time, patient that her pain was due to her diverticulitis which she has a history of.  She requested no CT imaging at that time but be treated empirically with Cipro Flagyl which she states she has completed.  Patient states that about 5 days ago, her pain returned again but was worsening over the last several days.  She describes it as a "bloating, fullness pain."  Patient reports she still has been able to eat and drink without any difficulty.  Patient does report that her stools are loose but no diarrhea.  She states she has recently started a new weight loss diet which has incorporated new foods.  She denies any blood in her stools.  Patient reports her last bowel movement was earlier today.  Patient also reports that since August after taking antibiotics for strep throat, she has had diffuse rash noted to chest and abdomen.  Patient denies any dysuria, hematuria, fevers, nausea/vomiting, CP, SOB.   The history is provided by the patient.    Past Medical History:  Diagnosis Date  . Abdominal distention   . Abdominal pain   . Anxiety   . Blood in stool   . Constipation   . Diverticulitis    Per pt. States she never had CT scan to confirm this  . Diverticulosis    PT [facility]IZED AT [facility] October 03, 2011  . Gestational hypertension 07/05/2013  . Gestational  hypertension 07/05/2013  . History of chicken pox    childhood  . History of hiatal hernia   . Hypertension    During pregnancy only  . Maternal obesity, antepartum 07/05/2013  . Maternal obesity, antepartum 07/05/2013  . Numbness    first two fingers right hand   . Rectal bleeding   . Shortness of breath dyspnea    with exercise  . Umbilical hernia    planning surgical repair    Patient Active Problem List   Diagnosis Date Noted  . Sore throat 12/11/2017  . Streptococcal pharyngitis 12/11/2017  . Morbid obesity (HCC) 11/17/2014  . Atypical chest pain 07/17/2013  . Diverticular disease 10/23/2012  . Umbilical hernia 10/26/2011  . Anxiety 07/26/2011    Past Surgical History:  Procedure Laterality Date  . BREATH TEK H PYLORI N/A 07/22/2014   Procedure: BREATH TEK H PYLORI;  Surgeon: Ovidio Kin, MD;  Location: [facility];  Service: General;  Laterality: N/A;  . CESAREAN SECTION  05/28/09  . CESAREAN SECTION N/A 07/05/2013   Procedure: REPEAT CESAREAN SECTION (With Special Wound Vac);  Surgeon: Robley Fries, MD;  Location: [facility];  Service: Obstetrics;  Laterality: N/A;   EDD: 07/23/13  . COLONOSCOPY  10/07/2011   Procedure: COLONOSCOPY;  Surgeon: Theda Belfast, MD;  Location: [facility];  Service: Endoscopy;  Laterality: N/A;  . ESOPHAGOGASTRODUODENOSCOPY  10/07/2011   Procedure: ESOPHAGOGASTRODUODENOSCOPY (EGD);  Surgeon: Theda BelfastPatrick D Hung, MD;  [facility];  Service: Endoscopy;  Laterality: N/A;  . LAPAROSCOPIC GASTRIC SLEEVE RESECTION WITH HIATAL HERNIA REPAIR N/A 11/17/2014   Procedure: LAPAROSCOPIC GASTRIC SLEEVE RESECTION ;  Surgeon: Ovidio Kinavid Newman, MD;  Location: [facility];  Service: General;  Laterality: N/A;  . SCAR REVISION  09/17/09   c-section scar revision  . UMBILICAL HERNIA REPAIR  11/01/2011   Procedure: HERNIA REPAIR UMBILICAL ADULT;  Surgeon: Velora Hecklerodd M Gerkin, MD;  Location: [facility];  Service: General;  Laterality: N/A;     OB History    Gravida  2   Para    2   Term  2   Preterm      AB      Living  2     SAB      TAB      Ectopic      Multiple      Live Births  2            Home Medications    Prior to Admission medications   Medication Sig Start Date End Date Taking? Authorizing Provider  acetaminophen (TYLENOL) 325 MG tablet Take 650 mg by mouth every 6 (six) hours as needed for headache.   Yes [provider]  CALCIUM PO Take 1 tablet daily by mouth.   Yes [provider]  cetirizine (ZYRTEC) 10 MG tablet Take 10 mg by mouth daily as needed for allergies. Reported on 07/20/2015   Yes [provider]  CRYSELLE-28 0.3-30 MG-MCG tablet Take 1 tablet by mouth daily. Reported on 07/20/2015 10/30/14  Yes [provider]  fluconazole (DIFLUCAN) 150 MG tablet Take 150 mg by mouth daily as needed for other. Yeast infections prophylaxis and sxs 04/22/18  Yes [provider]  lisdexamfetamine (VYVANSE) 40 MG capsule Take 1 capsule (40 mg total) by mouth daily. 04/12/18  Yes Arfeen, Phillips GroutSyed T, MD  LYSINE PO Take 1 tablet by mouth daily.   Yes [provider]  Multiple Vitamins-Minerals (WOMENS MULTIVITAMIN) TABS Take 1 tablet by mouth daily.   Yes [provider]  NYSTATIN powder Apply 1 application topically daily as needed for skin breakdown. 03/29/18  Yes [provider]  oxyCODONE-acetaminophen (PERCOCET) 5-325 MG tablet Take 1-2 tablets by mouth every 4 (four) hours as needed. 04/26/18  Yes Harris, Abigail, PA-C  Probiotic Product (PROBIOTIC COLON SUPPORT PO) Take 1 tablet by mouth daily.   Yes [provider]  pseudoephedrine (SUDAFED) 30 MG tablet Take 30 mg every 4 (four) hours as needed by mouth for congestion.   Yes [provider]  sertraline (ZOLOFT) 100 MG tablet Take 1 tablet (100 mg total) by mouth at bedtime. 02/26/18  Yes Arfeen, Phillips GroutSyed T, MD  traZODone (DESYREL) 100 MG tablet Take 1 tablet (100 mg total) by mouth at bedtime. 02/26/18  Yes  Arfeen, Phillips GroutSyed T, MD  amoxicillin (AMOXIL) 875 MG tablet Take 1 tablet (875 mg total) by mouth 2 (two) times daily. 04/22/18   Elvina SidleLauenstein, Kurt, MD  amoxicillin-clavulanate (AUGMENTIN) 875-125 MG tablet Take 1 tablet by mouth every 12 (twelve) hours. 05/12/18   Maxwell CaulLayden, Dillyn Joaquin A, PA-C  cefdinir (OMNICEF) 300 MG capsule Take 2 capsules (600 mg total) by mouth daily. Patient not taking: Reported on 05/12/2018 04/22/18   Elvina SidleLauenstein, Kurt, MD  ciprofloxacin (CIPRO) 500 MG tablet Take 1 tablet (500 mg total) by mouth 2 (two) times daily. One po bid x 7 days Patient not taking: Reported on 05/12/2018 04/26/18  Arthor CaptainHarris, Abigail, PA-C  Diclofenac Sodium 2 % SOLN 2 pumps bid to affected area Patient not taking: Reported on 04/26/2018 03/14/18   Lenda KelpHudnall, Shane R, MD  HYDROcodone-acetaminophen (NORCO/VICODIN) 5-325 MG tablet Take 1-2 tablets by mouth every 6 (six) hours as needed. 05/12/18   Maxwell CaulLayden, Aayra Hornbaker A, PA-C  metroNIDAZOLE (FLAGYL) 500 MG tablet Take 1 tablet (500 mg total) by mouth 2 (two) times daily. One po bid x 7 days Patient not taking: Reported on 05/12/2018 04/26/18   Arthor CaptainHarris, Abigail, PA-C  ondansetron (ZOFRAN) 4 MG tablet Take 1 tablet (4 mg total) by mouth every 8 (eight) hours as needed for nausea or vomiting. 04/26/18   Harris, Abigail, PA-C  ondansetron (ZOFRAN-ODT) 8 MG disintegrating tablet Take 1 tablet (8 mg total) by mouth every 8 (eight) hours as needed for nausea. 04/22/18   Elvina SidleLauenstein, Kurt, MD    Family History Family History  Problem Relation Age of Onset  . Diabetes Mother   . Hypertension Mother   . Ulcerative colitis Father   . Hypertension Father   . Cancer Maternal Grandmother        lung    Social History Social History   Tobacco Use  . Smoking status: Never Smoker  . Smokeless tobacco: Never Used  Substance Use Topics  . Alcohol use: Yes    Alcohol/week: 0.0 standard drinks    Comment: occ  . Drug use: No     Allergies   Patient has no known allergies.   Review  of Systems Review of Systems  Constitutional: Negative for fever.  Respiratory: Negative for cough and shortness of breath.   Cardiovascular: Negative for chest pain.  Gastrointestinal: Positive for abdominal pain. Negative for blood in stool, diarrhea, nausea and vomiting.  Genitourinary: Negative for dysuria, hematuria, vaginal bleeding and vaginal discharge.  Neurological: Negative for headaches.  All other systems reviewed and are negative.    Physical Exam Updated Vital Signs BP (!) 141/83 (BP Location: Right Arm)   Pulse 63   Temp 98.2 F (36.8 C) (Oral)   Resp 16   SpO2 98%   Physical Exam Vitals signs and nursing note reviewed.  Constitutional:      Appearance: Normal appearance. She is well-developed.     Comments: Appears uncomfortable but no acute distress   HENT:     Head: Normocephalic and atraumatic.  Eyes:     General: Lids are normal.     Conjunctiva/sclera: Conjunctivae normal.     Pupils: Pupils are equal, round, and reactive to light.  Neck:     Musculoskeletal: Full passive range of motion without pain.  Cardiovascular:     Rate and Rhythm: Normal rate and regular rhythm.     Pulses: Normal pulses.     Heart sounds: Normal heart sounds. No murmur. No friction rub. No gallop.   Pulmonary:     Effort: Pulmonary effort is normal.     Breath sounds: Normal breath sounds.     Comments: Lungs clear to auscultation bilaterally.  Symmetric chest rise.  No wheezing, rales, rhonchi. Abdominal:     Palpations: Abdomen is soft. Abdomen is not rigid.     Tenderness: There is abdominal tenderness in the right lower quadrant, periumbilical area, suprapubic area and left lower quadrant. There is no right CVA tenderness, left CVA tenderness or guarding.     Comments: Abdomen is soft, non-distended. Diffuse tenderness noted to the lower abdomen bilaterally. Do focal tenderness. No rigidity, No guarding. No peritoneal signs.  Musculoskeletal:  Normal range of motion.    Skin:    General: Skin is warm and dry.     Capillary Refill: Capillary refill takes less than 2 seconds.     Findings: Rash present.     Comments: Diffuse maculopapular rash noted to abdomen, pelvis.  No rash noted to palms.  No oral lesions.  Neurological:     Mental Status: She is alert and oriented to person, place, and time.  Psychiatric:        Speech: Speech normal.      ED Treatments / Results  Labs (all labs ordered are listed, but only abnormal results are displayed) Labs Reviewed  COMPREHENSIVE METABOLIC PANEL - Abnormal; Notable for the following components:      Result Value   Glucose, Bld 108 (*)    All other components within normal limits  CBC - Abnormal; Notable for the following components:   WBC 10.6 (*)    All other components within normal limits  URINALYSIS, ROUTINE W REFLEX MICROSCOPIC - Abnormal; Notable for the following components:   APPearance HAZY (*)    Leukocytes, UA TRACE (*)    Bacteria, UA RARE (*)    All other components within normal limits  LIPASE, BLOOD  I-STAT BETA HCG BLOOD, ED ([facility], [facility], [facility] ONLY)    EKG None  Radiology Ct Abdomen Pelvis W Contrast  Result Date: 05/12/2018 CLINICAL DATA:  Abdominal pain.  Recent diverticulitis. EXAM: CT ABDOMEN AND PELVIS WITH CONTRAST TECHNIQUE: Multidetector CT imaging of the abdomen and pelvis was performed using the standard protocol following bolus administration of intravenous contrast. CONTRAST:  ISOVUE-300 IOPAMIDOL (ISOVUE-300) INJECTION 61% COMPARISON:  June 18, 2012 FINDINGS: Lower chest: Lung bases are clear. Hepatobiliary: No focal liver lesions are appreciated. There is slight fatty infiltration near the fissure for the ligamentum teres. Gallbladder wall is not appreciably thickened. There is no biliary duct dilatation. Pancreas: No pancreatic mass or inflammatory focus. Spleen: No splenic lesions are evident. Adrenals/Urinary Tract: Adrenals appear normal bilaterally. Kidneys  bilaterally show no evident mass or hydronephrosis on either side. There is no appreciable renal or ureteral calculus on either side. Urinary bladder is midline with wall thickness within normal limits. Stomach/Bowel: There are sigmoid and descending colonic diverticula. There is wall thickening with mesenteric stranding and adjacent fluid along the mid the distal aspects of the descending colon consistent with diverticulitis. Several irregular diverticular noted in this area. There is no frank abscess or perforation in these areas. Note that bowel wall appears edematous in this area. Elsewhere, there is evidence of previous gastric sleeve resection. There is no bowel wall thickening or abnormal fluid in this area. There is no evident bowel obstruction. There is no free air or portal venous air. Vascular/Lymphatic: There is no appreciable abdominal aortic aneurysm. No vascular lesions evident. There is no adenopathy in the abdomen or pelvis. Reproductive: Uterus is anteverted.  No evident pelvic mass. Other: There is a ventral hernia containing fat and changes suggesting a degree of mesenteric panniculitis. There is scarring surrounding the hernia. This hernia has a measured diameter of 5.0 cm from right to left dimension at its neck. This hernia has a superior to inferior dimension at its neck measuring 3.8 cm. Appendix appears normal. No abscess or ascites is evident in the abdomen or pelvis. Musculoskeletal: No blastic or lytic bone lesions. No intramuscular lesions are evident. IMPRESSION: 1. Diverticulitis involving mid to distal descending colon. There is mild fluid tracking along this area of diverticulitis, but  there is no frank abscess or perforation. There is diverticulosis throughout the descending colon as well as diverticulosis in portions of the sigmoid colon. No evident bowel obstruction. 2. Ventral hernia containing fat and soft tissue thickening, likely due to panniculitis. No bowel extends into  this ventral hernia. There is mild scarring in the area of the hernia in the anterior abdominal wall. 3.  No abscess in the abdomen or pelvis.  Appendix appears normal. 4. No evident renal or ureteral calculus. No hydronephrosis. Urinary bladder wall thickness is unremarkable. Electronically Signed   By: Bretta Bang III M.D.   On: 05/12/2018 07:35    Procedures Procedures (including critical care time)  Medications Ordered in ED Medications  sodium chloride flush (NS) 0.9 % injection 3 mL (3 mLs Intravenous Given by Other 05/12/18 0718)  fentaNYL (SUBLIMAZE) injection 100 mcg (100 mcg Intravenous Given 05/12/18 0713)  iopamidol (ISOVUE-300) 61 % injection 100 mL (100 mLs Intravenous Contrast Given 05/12/18 0721)  morphine 4 MG/ML injection 4 mg (4 mg Intravenous Given 05/12/18 0938)  amoxicillin-clavulanate (AUGMENTIN) 875-125 MG per tablet 1 tablet (1 tablet Oral Given 05/12/18 0946)     Initial Impression / Assessment and Plan / ED Course  I have reviewed the triage vital signs and the nursing notes.  Pertinent labs & imaging results that were available during my care of the patient were reviewed by me and considered in my medical decision making (see chart for details).     38 year old female with past medical history of diverticulosis who presents for evaluation of bilateral lower abdominal pain this been ongoing for last 5 days.  She is seen here on 04/26/18 and was treated empirically for diverticulitis.  No imaging at that time.  Returns now because of worsening pain. Patient is afebrile, non-toxic appearing, sitting comfortably on examination table. Vital signs reviewed and stable. Consider infectious etiology vs reticulitis.  Initial labs ordered at triage. Plan to CT abd/pelvis.   I-STAT beta negative.  CMP is unremarkable.  CBC shows slight leukocytosis of 10.6.  Lipase unremarkable.  UA shows trace leukocytes.  Otherwise unremarkable.  CT on pelvis shows evidence of  diverticulitis.  No evidence of perforation or abscess.  There is mention of ventral hernia that is fat-containing.  No bowel containing hernia.  Discussed results with patient.  Patient reports that the initial pain medication did not help her pain and she is still having significant pain.  Will give additional analgesics and reassess.  I discussed with patient that given that she is recently been on Cipro Flagyl, will change antibiotic to Augmentin for treatment of diverticulitis.  Reevaluation after second analgesics.  Patient reports improvement in pain but is still present.  She reports that she still having some discomfort in the lower abdomen but is improved.  Patient states she is ready to go home.  Will give short course of pain medication.  Patient reviewed on PMP.  Most recent narcotic prescription was in 04/26/18 when she was seen here previously for diverticulitis.  Additionally, will plan to send home with Augmentin. At this time, patient exhibits no emergent life-threatening condition that require further evaluation in ED or admission. Patient had ample opportunity for questions and discussion. All patient's questions were answered with full understanding. Strict return precautions discussed. Patient expresses understanding and agreement to plan.   Portions of this note were generated with Scientist, clinical (histocompatibility and immunogenetics). Dictation errors may occur despite best attempts at proofreading.   Final Clinical Impressions(s) / ED Diagnoses  Final diagnoses:  Diverticulitis    ED Discharge Orders         Ordered    amoxicillin-clavulanate (AUGMENTIN) 875-125 MG tablet  Every 12 hours     05/12/18 1015    HYDROcodone-acetaminophen (NORCO/VICODIN) 5-325 MG tablet  Every 6 hours PRN     05/12/18 1015           Maxwell Caul, PA-C 05/12/18 1540    Shaune Pollack, MD 05/14/18 1102

## 2018-05-12 NOTE — Discharge Instructions (Signed)
You can take 1000 mg of Tylenol.  Do not exceed 4000 mg of Tylenol a day.  Take pain medications as directed for break through pain. Do not drive or operate machinery while taking this medication.   Take antibiotics as directed. Please take all of your antibiotics until finished.  Follow-up with your primary care doctor next 2 to 4 days for further evaluation.  Return the emergency department for any worsening pain, high fever, persistent vomiting, any other worsening or concerning symptoms.

## 2018-05-12 NOTE — ED Notes (Signed)
Pt returned from CT at this time. NAD noted. RN will continue to monitor pt at this time.

## 2018-05-12 NOTE — ED Triage Notes (Signed)
Pt reports continued belly pain. She states that she was seen here for diverticulitis about 2 weeks ago and finished her antibiotics. She also reports hx of umbilical hernia. She also reports a rash on her stomach, breasts, buttocks, and back.  States that the rash is not itchy and that it started when she had strep throat a few months ago. Denies N/V/or diarrhea.

## 2018-05-14 DIAGNOSIS — M7632 Iliotibial band syndrome, left leg: Secondary | ICD-10-CM | POA: Diagnosis not present

## 2018-05-15 DIAGNOSIS — Z6841 Body Mass Index (BMI) 40.0 and over, adult: Secondary | ICD-10-CM | POA: Diagnosis not present

## 2018-05-15 DIAGNOSIS — Z713 Dietary counseling and surveillance: Secondary | ICD-10-CM | POA: Diagnosis not present

## 2018-05-22 DIAGNOSIS — L309 Dermatitis, unspecified: Secondary | ICD-10-CM | POA: Diagnosis not present

## 2018-05-22 DIAGNOSIS — F5089 Other specified eating disorder: Secondary | ICD-10-CM | POA: Diagnosis not present

## 2018-05-22 DIAGNOSIS — Z6841 Body Mass Index (BMI) 40.0 and over, adult: Secondary | ICD-10-CM | POA: Diagnosis not present

## 2018-05-28 DIAGNOSIS — M7632 Iliotibial band syndrome, left leg: Secondary | ICD-10-CM | POA: Diagnosis not present

## 2018-05-29 ENCOUNTER — Encounter (HOSPITAL_COMMUNITY): Payer: Self-pay | Admitting: Psychiatry

## 2018-05-29 ENCOUNTER — Ambulatory Visit (INDEPENDENT_AMBULATORY_CARE_PROVIDER_SITE_OTHER): Payer: BLUE CROSS/BLUE SHIELD | Admitting: Psychiatry

## 2018-05-29 DIAGNOSIS — F900 Attention-deficit hyperactivity disorder, predominantly inattentive type: Secondary | ICD-10-CM

## 2018-05-29 DIAGNOSIS — F419 Anxiety disorder, unspecified: Secondary | ICD-10-CM

## 2018-05-29 DIAGNOSIS — F330 Major depressive disorder, recurrent, mild: Secondary | ICD-10-CM | POA: Diagnosis not present

## 2018-05-29 MED ORDER — LISDEXAMFETAMINE DIMESYLATE 40 MG PO CAPS: 40.0000 mg | ORAL_CAPSULE | Freq: Every day | ORAL | 0 refills | Status: DC

## 2018-05-29 MED ORDER — TRAZODONE HCL 100 MG PO TABS: 100.0000 mg | ORAL_TABLET | Freq: Every day | ORAL | 0 refills | Status: DC

## 2018-05-29 MED ORDER — SERTRALINE HCL 100 MG PO TABS: 100.0000 mg | ORAL_TABLET | Freq: Every day | ORAL | 0 refills | Status: DC

## 2018-05-29 NOTE — Progress Notes (Signed)
BH MD/PA/NP OP Progress Note  05/29/2018 3:53 PM Ann Santos  MRN:  [id number]  Chief Complaint: I am doing much better.  [facility] and I lost weight since I join.  HPI: Ann Santos came for her follow-up appointment.  She is taking her medication as prescribed.  Last visit we increased Vyvanse as she was complaining it is not helping her attention focus and difficulty multitasking.  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  Since the dose increase she is doing much better.  She is able to finish her job on time.  Has any irritability, anger, mood swing.  She is doing marriage counseling with Harrie Foremanara [place] and things are going well.  She switch therapist and she noticed much improvement.  Patient denies any crying spells, irritability, suicidal thoughts or any feeling of hopelessness or worthlessness.  Patient told lately she has been very sick and having multiple course of antibiotic but now she is off the antibiotic and things are going well.  Her daughter sees psychiatrist in this office.  Patient denies drinking or using any illegal substances.  Energy level is good.  Visit Diagnosis:    ICD-10-CM   1. Mild episode of recurrent major depressive disorder (HCC) F33.0 traZODone (DESYREL) 100 MG tablet    sertraline (ZOLOFT) 100 MG tablet  2. Anxiety F41.9 sertraline (ZOLOFT) 100 MG tablet  3. Attention deficit hyperactivity disorder (ADHD), predominantly inattentive type F90.0 lisdexamfetamine (VYVANSE) 40 MG capsule    Past Psychiatric History: Reviewed. Started antidepressant in 2009 when husband going through drug addiction. Tried Celexa, Wellbutrin, Prozac and Strattera. Had a good response with Zoloft and trazodone. No H/O inpatient treatment, mania, psychosis, hallucination or any suicidal attempt.  Past Medical History:  Past Medical History:  Diagnosis Date  . Abdominal distention   . Abdominal pain   . Anxiety   . Blood in stool   .  Constipation   . Diverticulitis    Per pt. States she never had CT scan to confirm this  . Diverticulosis    [facility]  . Gestational hypertension 07/05/2013  . Gestational hypertension 07/05/2013  . History of chicken pox    childhood  . History of hiatal hernia   . Hypertension    During pregnancy only  . Maternal obesity, antepartum 07/05/2013  . Maternal obesity, antepartum 07/05/2013  . Numbness    first two fingers right hand   . Rectal bleeding   . Shortness of breath dyspnea    with exercise  . Umbilical hernia    planning surgical repair    Past Surgical History:  Procedure Laterality Date  . BREATH TEK H PYLORI N/A 07/22/2014   Procedure: BREATH TEK H PYLORI;  Surgeon: Ovidio Kinavid Newman, MD;  [facility];  Service: General;  Laterality: N/A;  . CESAREAN SECTION  05/28/09  . CESAREAN SECTION N/A 07/05/2013   Procedure: REPEAT CESAREAN SECTION (With Special Wound Vac);  Surgeon: Robley FriesVaishali R Mody, MD;  Location: [facility];  Service: Obstetrics;  Laterality: N/A;   EDD: 07/23/13  . COLONOSCOPY  10/07/2011   Procedure: COLONOSCOPY;  Surgeon: Theda BelfastPatrick D Hung, MD;  Location: [facility];  Service: Endoscopy;  Laterality: N/A;  . ESOPHAGOGASTRODUODENOSCOPY  10/07/2011   Procedure: ESOPHAGOGASTRODUODENOSCOPY (EGD);  Surgeon: Theda BelfastPatrick D Hung, MD;  [facility];  Service: Endoscopy;  Laterality: N/A;  . LAPAROSCOPIC GASTRIC SLEEVE RESECTION WITH HIATAL HERNIA REPAIR N/A 11/17/2014   Procedure:  LAPAROSCOPIC GASTRIC SLEEVE RESECTION ;  Surgeon: Ovidio Kinavid Newman, MD;  Location: [facility];  Service: General;  Laterality: N/A;  . SCAR REVISION  09/17/09   c-section scar revision  . UMBILICAL HERNIA REPAIR  11/01/2011   Procedure: HERNIA REPAIR UMBILICAL ADULT;  Surgeon: Velora Hecklerodd M Gerkin, MD;  Location: [facility];  Service: General;  Laterality: N/A;    Family Psychiatric History: Reviewed.  Family History:  Family History  Problem Relation Age of Onset  . Diabetes  Mother   . Hypertension Mother   . Ulcerative colitis Father   . Hypertension Father   . Cancer Maternal Grandmother        lung    Social History:  Social History   Socioeconomic History  . Marital status: Married    Spouse name: Not on file  . Number of children: 2  . Years of education: Not on file  . Highest education level: Not on file  Occupational History  . Not on file  Social Needs  . Financial resource strain: Not on file  . Food insecurity:    Worry: Not on file    Inability: Not on file  . Transportation needs:    Medical: Not on file    Non-medical: Not on file  Tobacco Use  . Smoking status: Never Smoker  . Smokeless tobacco: Never Used  Substance and Sexual Activity  . Alcohol use: Yes    Alcohol/week: 0.0 standard drinks    Comment: occ  . Drug use: No  . Sexual activity: Yes    Partners: Male    Birth control/protection: Pill  Lifestyle  . Physical activity:    Days per week: Not on file    Minutes per session: Not on file  . Stress: Not on file  Relationships  . Social connections:    Talks on phone: Not on file    Gets together: Not on file    Attends religious service: Not on file    Active member of club or organization: Not on file    Attends meetings of clubs or organizations: Not on file    Relationship status: Not on file  Other Topics Concern  . Not on file  Social History Narrative  . Not on file    Allergies: No Known Allergies  Metabolic Disorder Labs: Lab Results  Component Value Date   HGBA1C 4.6 07/07/2016   MPG 85 07/07/2016   No results found for: PROLACTIN Lab Results  Component Value Date   CHOL 182 10/26/2011   TRIG 140 10/26/2011   HDL 47 10/26/2011   CHOLHDL 3.9 10/26/2011   VLDL 28 10/26/2011   LDLCALC 107 (H) 10/26/2011   Lab Results  Component Value Date   TSH 1.950 01/31/2018   TSH 1.83 07/07/2016    Therapeutic Level Labs: No results found for: LITHIUM No results found for: VALPROATE No  components found for:  CBMZ  Current Medications: Current Outpatient Medications  Medication Sig Dispense Refill  . acetaminophen (TYLENOL) 325 MG tablet Take 650 mg by mouth every 6 (six) hours as needed for headache.    Marland Kitchen. amoxicillin (AMOXIL) 875 MG tablet Take 1 tablet (875 mg total) by mouth 2 (two) times daily. 20 tablet 5  . amoxicillin-clavulanate (AUGMENTIN) 875-125 MG tablet Take 1 tablet by mouth every 12 (twelve) hours. 14 tablet 0  . CALCIUM PO Take 1 tablet daily by mouth.    . cefdinir (OMNICEF) 300 MG capsule Take 2 capsules (600 mg total) by mouth  daily. 20 capsule 0  . cetirizine (ZYRTEC) 10 MG tablet Take 10 mg by mouth daily as needed for allergies. Reported on 07/20/2015    . ciprofloxacin (CIPRO) 500 MG tablet Take 1 tablet (500 mg total) by mouth 2 (two) times daily. One po bid x 7 days 14 tablet 0  . CRYSELLE-28 0.3-30 MG-MCG tablet Take 1 tablet by mouth daily. Reported on 07/20/2015  12  . Diclofenac Sodium 2 % SOLN 2 pumps bid to affected area 112 g 2  . fluconazole (DIFLUCAN) 150 MG tablet Take 150 mg by mouth daily as needed for other. Yeast infections prophylaxis and sxs    . HYDROcodone-acetaminophen (NORCO/VICODIN) 5-325 MG tablet Take 1-2 tablets by mouth every 6 (six) hours as needed. 6 tablet 0  . lisdexamfetamine (VYVANSE) 40 MG capsule Take 1 capsule (40 mg total) by mouth daily. 30 capsule 0  . LYSINE PO Take 1 tablet by mouth daily.    . Multiple Vitamins-Minerals (WOMENS MULTIVITAMIN) TABS Take 1 tablet by mouth daily.    . NYSTATIN powder Apply 1 application topically daily as needed for skin breakdown.    . ondansetron (ZOFRAN) 4 MG tablet Take 1 tablet (4 mg total) by mouth every 8 (eight) hours as needed for nausea or vomiting. 10 tablet 0  . ondansetron (ZOFRAN-ODT) 8 MG disintegrating tablet Take 1 tablet (8 mg total) by mouth every 8 (eight) hours as needed for nausea. 12 tablet 5  . oxyCODONE-acetaminophen (PERCOCET) 5-325 MG tablet Take 1-2 tablets  by mouth every 4 (four) hours as needed. 20 tablet 0  . Probiotic Product (PROBIOTIC COLON SUPPORT PO) Take 1 tablet by mouth daily.    . pseudoephedrine (SUDAFED) 30 MG tablet Take 30 mg every 4 (four) hours as needed by mouth for congestion.    . sertraline (ZOLOFT) 100 MG tablet Take 1 tablet (100 mg total) by mouth at bedtime. 90 tablet 0  . traZODone (DESYREL) 100 MG tablet Take 1 tablet (100 mg total) by mouth at bedtime. 90 tablet 0   No current facility-administered medications for this visit.      Musculoskeletal: Strength & Muscle Tone: within normal limits Gait & Station: normal Patient leans: N/A  Psychiatric Specialty Exam: ROS  Blood pressure 136/79, pulse 79, height 5\' 2"  (1.575 m), weight 249 lb (112.9 kg).Body mass index is 45.54 kg/m.  General Appearance: Casual  Eye Contact:  Good  Speech:  Clear and Coherent  Volume:  Normal  Mood:  Euthymic  Affect:  Congruent  Thought Process:  Goal Directed  Orientation:  Full (Time, Place, and Person)  Thought Content: Logical   Suicidal Thoughts:  No  Homicidal Thoughts:  No  Memory:  Immediate;   Good Recent;   Good Remote;   Good  Judgement:  Good  Insight:  Good  Psychomotor Activity:  Normal  Concentration:  Concentration: Good and Attention Span: Good  Recall:  Good  Fund of Knowledge: Good  Language: Good  Akathisia:  No  Handed:  Right  AIMS (if indicated): not done  Assets:  Communication Skills Desire for Improvement Housing Physical Health Resilience Social Support Talents/Skills Transportation  ADL's:  Intact  Cognition: WNL  Sleep:  Good   Screenings: PHQ2-9     Office Visit from 02/22/2018 in Primary Care at [place] Office Visit from 01/31/2018 in Primary Care at [facility] Visit from 12/11/2017 in Primary Care at [facility] Visit from 09/01/2017 in Primary Care at [facility] Visit from 03/07/2017 in Primary  Care at [facility] Total Score  0  0  0  0  0       Assessment and  Plan: Major depressive disorder, recurrent.  Attention deficit disorder, inattentive type.  Anxiety.  Patient is stable on her current medication.  She joint weight loss program and she is able to lost weight from the past.  She feels more energetic upbeat and she has no concerns or side effects from the medication.  She has no rash, tremors or shakes.  She has no insomnia or any palpitation.  Encouraged to continue marriage counseling with Harrie Foreman.  Continue Vyvanse 40 mg daily, trazodone 100 mg half to 1 tablet as needed for insomnia and Zoloft 200 mg daily for anxiety and depression.  Recommended to call us back if she is any question or any concern.  Follow-up in 3 months.   Cleotis Nipper, MD 05/29/2018, 3:53 PM

## 2018-05-31 ENCOUNTER — Telehealth (HOSPITAL_COMMUNITY): Payer: Self-pay

## 2018-05-31 NOTE — Telephone Encounter (Signed)
Received a call from patients pharmacy, they states that they had received a Vyvanse prescription from Dr. Lolly Mustache for 40 mg capsules, but that they had one on file for 50 mg from Dr. Cletis Athens. I advised them not to fill our prescription and called Dr. Junious Dresser office. I spoke with the nurse Mardella Layman and she confirmed that patient was there for a visit on 2/3 and had received a prescription from them. Patient had disclosed on her paperwork that she was taking Vyvanse, however our medications and notes were not visible to the staff there. We are discontinuing patients Vyvanse per Dr. Lolly Mustache.

## 2018-05-31 NOTE — Telephone Encounter (Signed)
Patient did not mention on her last visit that she is getting Vyvanse for her weight loss program.  We will discontinue Vyvanse 40 mg and she can resume Vyvanse from her weight loss program.

## 2018-06-04 DIAGNOSIS — M7632 Iliotibial band syndrome, left leg: Secondary | ICD-10-CM | POA: Diagnosis not present

## 2018-06-06 DIAGNOSIS — Z713 Dietary counseling and surveillance: Secondary | ICD-10-CM | POA: Diagnosis not present

## 2018-06-06 DIAGNOSIS — Z6841 Body Mass Index (BMI) 40.0 and over, adult: Secondary | ICD-10-CM | POA: Diagnosis not present

## 2018-06-19 DIAGNOSIS — Z6841 Body Mass Index (BMI) 40.0 and over, adult: Secondary | ICD-10-CM | POA: Diagnosis not present

## 2018-06-19 DIAGNOSIS — R635 Abnormal weight gain: Secondary | ICD-10-CM | POA: Diagnosis not present

## 2018-06-19 DIAGNOSIS — R632 Polyphagia: Secondary | ICD-10-CM | POA: Diagnosis not present

## 2018-06-20 DIAGNOSIS — M7632 Iliotibial band syndrome, left leg: Secondary | ICD-10-CM | POA: Diagnosis not present

## 2018-06-21 DIAGNOSIS — M7632 Iliotibial band syndrome, left leg: Secondary | ICD-10-CM | POA: Diagnosis not present

## 2018-06-27 ENCOUNTER — Telehealth (HOSPITAL_COMMUNITY): Payer: Self-pay

## 2018-06-27 DIAGNOSIS — F900 Attention-deficit hyperactivity disorder, predominantly inattentive type: Secondary | ICD-10-CM

## 2018-06-27 NOTE — Telephone Encounter (Signed)
I received a call from Dr. Junious Dresser office, they have decided they will not resume the patients Vyvanse. They have switched her to an injectable. Dr. Cletis Athens would like to speak with you and she will be calling Thursday at 4 pm. Patient wants to know if she can get the prescription back from you.

## 2018-06-29 ENCOUNTER — Telehealth (HOSPITAL_COMMUNITY): Payer: Self-pay

## 2018-06-29 MED ORDER — LISDEXAMFETAMINE DIMESYLATE 30 MG PO CAPS: 30.0000 mg | ORAL_CAPSULE | Freq: Every day | ORAL | 0 refills | Status: DC

## 2018-06-29 NOTE — Telephone Encounter (Signed)
Call returned and left a message to call us back.

## 2018-06-29 NOTE — Telephone Encounter (Signed)
Patient called again and she is concerned about her Vyvanse.  She was taking Vyvanse 40 mg but her weight loss physician prescribed 50 mg Vyvanse.  Patient told she is no longer taking Vyvanse from her weight loss physician. I explained that we have left a message to her weight loss physician Dr. Junious Dresser and have not received a call back.  She has been without Vyvanse since Tuesday and having withdrawal symptoms.  I explained that we can start Vyvanse 30 mg until we get collateral information from her physician.  She agreed with the plan.  We will call Vyvanse 30 mg to her pharmacy and once we have received collateral information from Dr. Cletis Athens we will resume original dose of Vyvanse 40 mg.  Vyvanse 30 mg called to her pharmacy [facility].

## 2018-07-20 ENCOUNTER — Telehealth (HOSPITAL_COMMUNITY): Payer: Self-pay

## 2018-07-20 NOTE — Telephone Encounter (Signed)
Patient is calling to see if you spoke with her doctor at the weight loss clinic. Patient would like to get her Vyvanse 40 mg back. Please review and advise, thank you

## 2018-07-20 NOTE — Telephone Encounter (Signed)
We have not received any message back from her physician.  She can continue 30 mg Vyvanse which was given on March 6.  We should try again to reach her weight loss physician before her next refill.

## 2018-07-24 ENCOUNTER — Other Ambulatory Visit: Payer: Self-pay

## 2018-07-24 ENCOUNTER — Telehealth (INDEPENDENT_AMBULATORY_CARE_PROVIDER_SITE_OTHER): Payer: BLUE CROSS/BLUE SHIELD | Admitting: Family Medicine

## 2018-07-24 ENCOUNTER — Encounter: Payer: Self-pay | Admitting: Family Medicine

## 2018-07-24 DIAGNOSIS — L309 Dermatitis, unspecified: Secondary | ICD-10-CM | POA: Diagnosis not present

## 2018-07-24 DIAGNOSIS — J301 Allergic rhinitis due to pollen: Secondary | ICD-10-CM

## 2018-07-24 DIAGNOSIS — R0981 Nasal congestion: Secondary | ICD-10-CM

## 2018-07-24 MED ORDER — MUPIROCIN 2 % EX OINT: 1.0000 "application " | TOPICAL_OINTMENT | Freq: Two times a day (BID) | CUTANEOUS | 0 refills | Status: DC

## 2018-07-24 MED ORDER — CLOBETASOL PROPIONATE 0.05 % EX OINT: 1.0000 "application " | TOPICAL_OINTMENT | Freq: Two times a day (BID) | CUTANEOUS | 0 refills | Status: DC

## 2018-07-24 NOTE — Progress Notes (Signed)
Telemedicine Encounter- SOAP NOTE Established Patient  This telephone encounter was conducted with the patient's (or proxy's) verbal consent via audio telecommunications: yes/no: Yes Patient was instructed to have this encounter in a suitably private space; and to only have persons present to whom they give permission to participate. In addition, patient identity was confirmed by use of name plus two identifiers (DOB and address).  I discussed the limitations, risks, security and privacy concerns of performing an evaluation and management service by telephone and the availability of in person appointments. I also discussed with the patient that there may be a patient responsible charge related to this service. The patient expressed understanding and agreed to proceed.  I spent a total of TIME; 0 MIN TO 60 MIN: 25 minutes talking with the patient or their proxy.  CC:  Sinus symptoms  Subjective   Ann Santos is a 38 y.o. established patient. 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  She states that she is pulling out large impacted mucus She states that she is taking sudafed mulitple times a day She states that in the morning she can blow her nose in the shower but the mucus never stops The nose looks irritated and it looks red and dry and flaky and the skin will bleed  She states that she has picked her nose to help with the flaking  Rash She has a rash and thinks that it is a reaction from taking numerous antibiotics for strep over and over She states that she has to put nystatin powder under her pannus She reports that she was given a cream with nystatin Right now it is not red but seems like it is all over and is really strange It started in August and has gotten worse It is on the breast and buttocks and now is trickling down her leg They are not  raised They look like tiny scrapes and sometimes the skin can peel off No blisters They do not itch but they are very dry and irritated They are also under her breasts    Patient Active Problem List   Diagnosis Date Noted  . Sore throat 12/11/2017  . Streptococcal pharyngitis 12/11/2017  . Morbid obesity (HCC) 11/17/2014  . Atypical chest pain 07/17/2013  . Diverticular disease 10/23/2012  . Umbilical hernia 10/26/2011  . Anxiety 07/26/2011    Past Medical History:  Diagnosis Date  . Abdominal distention   . Abdominal pain   . Anxiety   . Blood in stool   . Constipation   . Diverticulitis    Per pt. States she never had CT scan to confirm this  . Diverticulosis    PT HOSPITALIZED AT [facility] October 03, 2011  . Gestational hypertension 07/05/2013  . Gestational hypertension 07/05/2013  . History of chicken pox    childhood  . History of hiatal hernia   . Hypertension    During pregnancy only  . Maternal obesity, antepartum 07/05/2013  . Maternal obesity, antepartum 07/05/2013  . Numbness    first two fingers right hand   . Rectal bleeding   . Shortness of breath dyspnea    with exercise  . Umbilical hernia    planning surgical repair    Current Outpatient Medications  Medication Sig Dispense Refill  . acetaminophen (TYLENOL) 325 MG tablet Take 650 mg by  mouth every 6 (six) hours as needed for headache.    Marland Kitchen CALCIUM PO Take 1 tablet daily by mouth.    . cetirizine (ZYRTEC) 10 MG tablet Take 10 mg by mouth daily as needed for allergies. Reported on 07/20/2015    . CRYSELLE-28 0.3-30 MG-MCG tablet Take 1 tablet by mouth daily. Reported on 07/20/2015  12  . lisdexamfetamine (VYVANSE) 30 MG capsule Take 1 capsule (30 mg total) by mouth daily. 30 capsule 0  . LYSINE PO Take 1 tablet by mouth daily.    . Multiple Vitamins-Minerals (WOMENS MULTIVITAMIN) TABS Take 1 tablet by mouth daily.    . NYSTATIN powder Apply 1 application topically daily as needed for skin breakdown.    .  Probiotic Product (PROBIOTIC COLON SUPPORT PO) Take 1 tablet by mouth daily.    . sertraline (ZOLOFT) 100 MG tablet Take 1 tablet (100 mg total) by mouth at bedtime. 90 tablet 0  . traZODone (DESYREL) 100 MG tablet Take 1 tablet (100 mg total) by mouth at bedtime. 90 tablet 0  . clobetasol ointment (TEMOVATE) 0.05 % Apply 1 application topically 2 (two) times daily. Only for 2 weeks. 30 g 0  . mupirocin ointment (BACTROBAN) 2 % Place 1 application into the nose 2 (two) times daily. 22 g 0   No current facility-administered medications for this visit.     No Known Allergies  Social History   Socioeconomic History  . Marital status: Married    Spouse name: Not on file  . Number of children: 2  . Years of education: Not on file  . Highest education level: Not on file  Occupational History  . Not on file  Social Needs  . Financial resource strain: Not on file  . Food insecurity:    Worry: Not on file    Inability: Not on file  . Transportation needs:    Medical: Not on file    Non-medical: Not on file  Tobacco Use  . Smoking status: Never Smoker  . Smokeless tobacco: Never Used  Substance and Sexual Activity  . Alcohol use: Yes    Alcohol/week: 0.0 standard drinks    Comment: occ  . Drug use: No  . Sexual activity: Yes    Partners: Male    Birth control/protection: Pill  Lifestyle  . Physical activity:    Days per week: Not on file    Minutes per session: Not on file  . Stress: Not on file  Relationships  . Social connections:    Talks on phone: Not on file    Gets together: Not on file    Attends religious service: Not on file    Active member of club or organization: Not on file    Attends meetings of clubs or organizations: Not on file    Relationship status: Not on file  . Intimate partner violence:    Fear of current or ex partner: Not on file    Emotionally abused: Not on file    Physically abused: Not on file    Forced sexual activity: Not on file  Other  Topics Concern  . Not on file  Social History Narrative  . Not on file    ROS Review of Systems  Constitutional: Negative for activity change, appetite change, chills and fever.  HENT: see hpi Respiratory: Negative for cough, shortness of breath and wheezing.   Gastrointestinal: Negative for diarrhea, nausea and vomiting.  Genitourinary: Negative for difficulty urinating, dysuria, flank pain and hematuria.  Musculoskeletal: Negative for back pain, joint swelling and neck pain.  Neurological: Negative for dizziness, speech difficulty, light-headedness and numbness.  See HPI. All other review of systems negative.   Objective   Vitals as reported by the patient: There were no vitals filed for this visit.  Diagnoses and all orders for this visit:  Seasonal allergic rhinitis due to pollen  Dermatitis-  Discussed skin care Will use clobetasol Discussed continuing zyrtec   Sinus congestion- discussed treating allergic rhinitis which is causing her sinus congestion Stop the sudafed Do steam and aromatherapy flonase and zyrtec  Other orders -     mupirocin ointment (BACTROBAN) 2 %; Place 1 application into the nose 2 (two) times daily. -     clobetasol ointment (TEMOVATE) 0.05 %; Apply 1 application topically 2 (two) times daily. Only for 2 weeks.     I discussed the assessment and treatment plan with the patient. The patient was provided an opportunity to ask questions and all were answered. The patient agreed with the plan and demonstrated an understanding of the instructions.   The patient was advised to call back or seek an in-person evaluation if the symptoms worsen or if the condition fails to improve as anticipated.  I provided 25 minutes of non-face-to-face time during this encounter.  Doristine Bosworth, MD  Primary Care at [facility]

## 2018-07-24 NOTE — Patient Instructions (Addendum)
   If you have lab work done today you will be contacted with your lab results within the next 2 weeks.  If you have not heard from us then please contact us. The fastest way to get your results is to register for My Chart.   IF you received an x-ray today, you will receive an invoice from [facility]. Please contact  Radiology at [phone] with questions or concerns regarding your invoice.   IF you received labwork today, you will receive an invoice from [facility]. Please contact [facility] at [phone] with questions or concerns regarding your invoice.   Our billing staff will not be able to assist you with questions regarding bills from these companies.  You will be contacted with the lab results as soon as they are available. The fastest way to get your results is to activate your My Chart account. Instructions are located on the last page of this paperwork. If you have not heard from us regarding the results in 2 weeks, please contact this office.    Allergic Rhinitis, Adult Allergic rhinitis is an allergic reaction that affects the mucous membrane inside the nose. It causes sneezing, a runny or stuffy nose, and the feeling of mucus going down the back of the throat (postnasal drip). Allergic rhinitis can be mild to severe. There are two types of allergic rhinitis:  Seasonal. This type is also called hay fever. It happens only during certain seasons.  Perennial. This type can happen at any time of the year. What are the causes? This condition happens when the body's defense system (immune system) responds to certain harmless substances called allergens as though they were germs.  Seasonal allergic rhinitis is triggered by pollen, which can come from grasses, trees, and weeds. Perennial allergic rhinitis may be caused by:  House dust mites.  Pet dander.  Mold spores. What are the signs or symptoms? Symptoms of this condition include:  Sneezing.  Runny  or stuffy nose (nasal congestion).  Postnasal drip.  Itchy nose.  Tearing of the eyes.  Trouble sleeping.  Daytime sleepiness. How is this diagnosed? This condition may be diagnosed based on:  Your medical history.  A physical exam.  Tests to check for related conditions, such as: ? Asthma. ? Pink eye. ? Ear infection. ? Upper respiratory infection.  Tests to find out which allergens trigger your symptoms. These may include skin or blood tests. How is this treated? There is no cure for this condition, but treatment can help control symptoms. Treatment may include:  Taking medicines that block allergy symptoms, such as antihistamines. Medicine may be given as a shot, nasal spray, or pill.  Avoiding the allergen.  Desensitization. This treatment involves getting ongoing shots until your body becomes less sensitive to the allergen. This treatment may be done if other treatments do not help.  If taking medicine and avoiding the allergen does not work, new, stronger medicines may be prescribed. Follow these instructions at home:  Find out what you are allergic to. Common allergens include smoke, dust, and pollen.  Avoid the things you are allergic to. These are some things you can do to help avoid allergens: ? Replace carpet with wood, tile, or vinyl flooring. Carpet can trap dander and dust. ? Do not smoke. Do not allow smoking in your home. ? Change your heating and air conditioning filter at least once a month. ? During allergy season:  Keep windows closed as much as possible.  Plan outdoor activities when   pollen counts are lowest. This is usually during the evening hours.  When coming indoors, change clothing and shower before sitting on furniture or bedding.  Take over-the-counter and prescription medicines only as told by your health care provider.  Keep all follow-up visits as told by your health care provider. This is important. Contact a health care provider  if:  You have a fever.  You develop a persistent cough.  You make whistling sounds when you breathe (you wheeze).  Your symptoms interfere with your normal daily activities. Get help right away if:  You have shortness of breath. Summary  This condition can be managed by taking medicines as directed and avoiding allergens.  Contact your health care provider if you develop a persistent cough or fever.  During allergy season, keep windows closed as much as possible. This information is not intended to replace advice given to you by your health care provider. Make sure you discuss any questions you have with your health care provider. Document Released: 01/04/2001 Document Revised: 05/19/2016 Document Reviewed: 05/19/2016 Elsevier Interactive Patient Education  2019 Elsevier Inc.  

## 2018-07-24 NOTE — Progress Notes (Signed)
CC- 1. Sinus infection 7 weeks not getting better. The last 4 weeks nasal cavity is infected. Lots of dry mucus, nose burning and pain in sinus cavity, bright red nasal cavity. No cough, no other symptoms.          2. Rash patches everywhere on body since Aug and would not go away. Think may need a steroid cream for the rash. Weight loss Dr Cletis [place] did give me a nystatin/triamcyinoline cream which did not help.

## 2018-07-31 ENCOUNTER — Telehealth (HOSPITAL_COMMUNITY): Payer: Self-pay

## 2018-07-31 DIAGNOSIS — F900 Attention-deficit hyperactivity disorder, predominantly inattentive type: Secondary | ICD-10-CM

## 2018-07-31 MED ORDER — LISDEXAMFETAMINE DIMESYLATE 30 MG PO CAPS: 30.0000 mg | ORAL_CAPSULE | Freq: Every day | ORAL | 0 refills | Status: DC

## 2018-07-31 NOTE — Telephone Encounter (Signed)
Medication refill request - Attempted to return call with patient requesting a refill of her prescribed Vyvanse. Patient stated she had left a previous message and is in need of this medication.

## 2018-07-31 NOTE — Telephone Encounter (Signed)
Send to [employer], Dance movement psychotherapist.

## 2018-08-01 NOTE — Telephone Encounter (Signed)
Medication management - Telephone call with patient to make sure she knew Dr. Lolly Mustache had sent in her requested refill of Vyvanse.

## 2018-08-08 DIAGNOSIS — Z713 Dietary counseling and surveillance: Secondary | ICD-10-CM | POA: Diagnosis not present

## 2018-08-08 DIAGNOSIS — Z6841 Body Mass Index (BMI) 40.0 and over, adult: Secondary | ICD-10-CM | POA: Diagnosis not present

## 2018-08-28 ENCOUNTER — Ambulatory Visit (INDEPENDENT_AMBULATORY_CARE_PROVIDER_SITE_OTHER): Payer: BLUE CROSS/BLUE SHIELD | Admitting: Psychiatry

## 2018-08-28 ENCOUNTER — Other Ambulatory Visit: Payer: Self-pay

## 2018-08-28 ENCOUNTER — Encounter (HOSPITAL_COMMUNITY): Payer: Self-pay | Admitting: Psychiatry

## 2018-08-28 DIAGNOSIS — F900 Attention-deficit hyperactivity disorder, predominantly inattentive type: Secondary | ICD-10-CM | POA: Diagnosis not present

## 2018-08-28 DIAGNOSIS — F419 Anxiety disorder, unspecified: Secondary | ICD-10-CM

## 2018-08-28 DIAGNOSIS — F330 Major depressive disorder, recurrent, mild: Secondary | ICD-10-CM

## 2018-08-28 MED ORDER — LISDEXAMFETAMINE DIMESYLATE 40 MG PO CAPS: 40.0000 mg | ORAL_CAPSULE | Freq: Every day | ORAL | 0 refills | Status: DC

## 2018-08-28 MED ORDER — SERTRALINE HCL 100 MG PO TABS: 100.0000 mg | ORAL_TABLET | Freq: Every day | ORAL | 0 refills | Status: DC

## 2018-08-28 MED ORDER — TRAZODONE HCL 100 MG PO TABS: 100.0000 mg | ORAL_TABLET | Freq: Every day | ORAL | 0 refills | Status: DC

## 2018-08-28 NOTE — Progress Notes (Signed)
Virtual Visit via Telephone Note  I connected with Ann Santos on 08/28/18 at  3:40 PM EDT by telephone and verified that I am speaking with the correct person using two identifiers.   I discussed the limitations, risks, security and privacy concerns of performing an evaluation and management service by telephone and the availability of in person appointments. I also discussed with the patient that there may be a patient responsible charge related to this service. The patient expressed understanding and agreed to proceed.   History of Present Illness: Patient was evaluated through phone session.  She struggled with focus and attention.  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  She used to take 40 mg of Vyvanse but she was given prescription of Vyvanse from her weight loss program and pharmacy reported that she has 2 prescription.  Now she wants to keep the Vyvanse from [place].  She realized it was a mistake.  She like to go back on 40 mg which is actually helping her a lot.  Due to pandemic coronavirus she feel that too much on her plate.  She is glad at least her husband is working and now seeing a Therapist, sports at [employer] and getting regular drug test.  She is sleeping okay.  She denies any crying spells or any feeling of hopelessness or worthlessness.  Her energy level is fair.  She admitted sometimes difficulty doing multitasking.  Her daughter sees psychiatrist in our office.  Patient denies drinking or using any illegal substances.  Patient denies any major panic attack.  She feels her Zoloft helping her anxiety.   Past Psychiatric History: Reviewed. Started antidepressantin 2009when husbandgoing through drug addiction.Tried Celexa, Wellbutrin, Prozac and Strattera. Had a good response with Zoloft and trazodone.No H/O inpatient treatment, mania, psychosis, hallucination or any suicidal attempt.    Observations/Objective: Mental status examination done on the phone.  Patient described her mood euthymic.  Her attention concentration is fair.  Her speech is fast but clear and coherent.  There were no flight of ideas or loose association.  Her thought process logical.  She denies any auditory or visual hallucination.  She denies any active or passive suicidal thoughts or homicidal thought.  There were no delusions, paranoia or any grandiosity.  She is alert and oriented x3.  Her cognition is good.  Her fund of knowledge is adequate.  She reported no tremors shakes or any EPS.  Her insight judgment is okay.  Assessment and Plan: Major depressive disorder, recurrent.  Anxiety.  Attention deficit disorder, inattentive type.  Discussed stimulant abuse, tolerance, withdrawal and policy that she should not be taking from to provider.  Patient acknowledge and promised that she will stick with 1 provider.  We will resume her previous dose of Vyvanse 40 mg in the morning.  It used to help her attention and focus.  Continue Zoloft 100 mg daily and trazodone 100 mg at bedtime.  Discussed medication side effects and benefits.  Recommended to call us back if she has any question or any concern.  Follow-up in 3 months.  Follow Up Instructions:    I discussed the assessment and treatment plan with the patient. The patient was provided an opportunity to ask questions and all were answered. The patient agreed with the plan and demonstrated an understanding of the instructions.   The patient was advised to call back or seek an in-person evaluation if the symptoms worsen  or if the condition fails to improve as anticipated.  I provided 20 minutes of non-face-to-face time during this encounter.   Kathlee Nations, MD

## 2018-09-12 ENCOUNTER — Ambulatory Visit (INDEPENDENT_AMBULATORY_CARE_PROVIDER_SITE_OTHER): Payer: BLUE CROSS/BLUE SHIELD | Admitting: Psychiatry

## 2018-09-12 ENCOUNTER — Other Ambulatory Visit: Payer: Self-pay

## 2018-09-12 DIAGNOSIS — F900 Attention-deficit hyperactivity disorder, predominantly inattentive type: Secondary | ICD-10-CM | POA: Diagnosis not present

## 2018-09-12 NOTE — Progress Notes (Signed)
Virtual Visit via Video Note  I connected with Ann Santos on 09/12/18 at  8:40 AM EDT by a video enabled telemedicine application and verified that I am speaking with the correct person using two identifiers.   I discussed the limitations of evaluation and management by telemedicine and the availability of in person appointments. The patient expressed understanding and agreed to proceed.  History of Present Illness: Patient requested an earlier appointment as she find out 2 days ago that medicine she is receiving from the pharmacy is actually empty in the ingredients.  Patient told she went to pick up her daughters Vyvanse at [facility] and found unusual that they are shallow.  When she opened the bottle she feels these capsules are empty from inside.  However the number of pills are correct.  She had a concerned that maybe it is happening to her own Vyvanse and she checked and she was shocked to noticed that her Vyvanse also have NPR very minimal ingredient in the capsule.  She went back to the pharmacy and talk to the pharmacist named Delorise ShinerGrace and explained the situation.  Patient told the pharmacy took those medicines back and return the new bottle.  She was also told that it could be a [employer] issue.  Patient then called insurance company and [employer] and explained the details.  However when she came back home and opened the new bottle she find the same issue.  Now she is very upset because she has noticed that for past few weeks she has been noticing not productive.  She feels not to herself, she has lack of motivation, and her attention and concentration is easily distracted.  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  She feels that she is not able to do multitasking and believe due to lack of actual ingredient of medication.  She also recall in November she had a same issue but at that time she blame her husband.  Now she feels very regret and remorse  blaming her husband about this issue.  Patient denies any suicidal thoughts or homicidal thought but appears very frustrated, irritable with the situation.   Observations/Objective: Patient appears in casual dress.  Her speech is fast and at times pressured.  Her mood is irritable and frustrated.  Her thought process fast but logical.  She denies any auditory or visual hallucination.  She denies any active or passive suicidal thoughts or homicidal thought.  Her attention concentration is distracted.  There were no tremors or EPS noted.  She denies any hallucination, paranoia or grandiosity.  She is alert and oriented x3.  Her fund of knowledge is good.  Her cognition is intact.  Her insight judgment is okay.  Assessment and Plan: Attention deficit hyperactivity disorder, predominantly inattentive type   Reassurance given.  I suggested that she should keep all the phone call log and documents that you have call insurance company, pharmacy and pharmaceutical company.  Explained that it is possible the whole batch has issue.  [facility]  I suggested that she should call the new pharmacy to find out if they have a different batch number and the capsules are not hollow from inside.  I also suggest that she should inform the police about the situation to avoid any future legal issues as she is been refilling her stimulants multiple times.  Patient agreed with the plan.  She will call the new  pharmacy to see if they have a different batch number and the capsules or not hollow from inside.  I also suggested that she should call her daughter's child psychiatrist and inform the situation.  Patient agreed with the plan.  We also discussed that if new pharmacy has the same issue then we may need to switch her medication.  I also recommend to call us back if symptoms started to get worse or anytime having suicidal thoughts or homicidal thoughts then she need to call  911.  Follow Up Instructions:    I discussed the assessment and treatment plan with the patient. The patient was provided an opportunity to ask questions and all were answered. The patient agreed with the plan and demonstrated an understanding of the instructions.   The patient was advised to call back or seek an in-person evaluation if the symptoms worsen or if the condition fails to improve as anticipated.  I provided 20 minutes of non-face-to-face time during this encounter.   Cleotis Nipper, MD

## 2018-09-19 ENCOUNTER — Telehealth (HOSPITAL_COMMUNITY): Payer: Self-pay

## 2018-09-19 NOTE — Telephone Encounter (Signed)
Patient called to inform us that she is not going to take the Vyvanse for now and going to contact her weight loss doctor she is working with. I relayed the message to her that the doctor will discuss everything with her at her next appointment scheduled for 11/29/18 and that the doctor does not want to change anything at this time. Patient was agreeable to that. Thank you.

## 2018-09-20 DIAGNOSIS — Z6841 Body Mass Index (BMI) 40.0 and over, adult: Secondary | ICD-10-CM | POA: Diagnosis not present

## 2018-09-20 DIAGNOSIS — Z01419 Encounter for gynecological examination (general) (routine) without abnormal findings: Secondary | ICD-10-CM | POA: Diagnosis not present

## 2018-09-20 DIAGNOSIS — R87610 Atypical squamous cells of undetermined significance on cytologic smear of cervix (ASC-US): Secondary | ICD-10-CM | POA: Diagnosis not present

## 2018-09-26 DIAGNOSIS — Z6841 Body Mass Index (BMI) 40.0 and over, adult: Secondary | ICD-10-CM | POA: Diagnosis not present

## 2018-09-26 DIAGNOSIS — B372 Candidiasis of skin and nail: Secondary | ICD-10-CM | POA: Diagnosis not present

## 2018-09-26 DIAGNOSIS — R635 Abnormal weight gain: Secondary | ICD-10-CM | POA: Diagnosis not present

## 2018-10-09 DIAGNOSIS — Z6841 Body Mass Index (BMI) 40.0 and over, adult: Secondary | ICD-10-CM | POA: Diagnosis not present

## 2018-10-09 DIAGNOSIS — Z713 Dietary counseling and surveillance: Secondary | ICD-10-CM | POA: Diagnosis not present

## 2018-10-15 ENCOUNTER — Telehealth (HOSPITAL_COMMUNITY): Payer: Self-pay

## 2018-10-15 DIAGNOSIS — F900 Attention-deficit hyperactivity disorder, predominantly inattentive type: Secondary | ICD-10-CM

## 2018-10-15 NOTE — Telephone Encounter (Signed)
Please check the message on 09/19/18. She told that she will get her Vyvanse from weight loss program. We will not provide any stimulants.

## 2018-10-15 NOTE — Telephone Encounter (Signed)
Patient is calling for a refill on her Vyvanse, she states that she is not on any other medications from her weight loss doctor except for Phentermine.

## 2018-10-17 MED ORDER — VYVANSE 40 MG PO CHEW: 40.0000 mg | CHEWABLE_TABLET | ORAL | 0 refills | Status: DC

## 2018-10-17 NOTE — Telephone Encounter (Signed)
I spoke with the nurse at the weight loss clinic who confirmed that patient is not getting Vyvanse through them and that patient stopped the Phentermine earlier this week, patient is requesting chewable tabs and [facility].

## 2018-10-17 NOTE — Telephone Encounter (Signed)
Please get the records from weight loss program that they are not providing any stimulants.  We can resume Vyvanse chewable at 40 mg as she was taking before 40 mg from our office.  She is also taking phentermine and she should not be taking the higher dose of stimulant.

## 2018-10-17 NOTE — Telephone Encounter (Signed)
I spoke to the patient and she states that she is not getting the Vyvanse from the weight loss program. She states that she is about to go back to work and she has not gotten any Vyvanse in over a month. She wants to know if she can get chewable. Please review and advise, thank you

## 2018-10-17 NOTE — Telephone Encounter (Signed)
Patients bariatric records are in [facility]

## 2018-10-17 NOTE — Telephone Encounter (Signed)
[facility]

## 2018-10-17 NOTE — Telephone Encounter (Signed)
I called the patient and let her know.

## 2018-10-31 ENCOUNTER — Telehealth (INDEPENDENT_AMBULATORY_CARE_PROVIDER_SITE_OTHER): Payer: BC Managed Care – PPO | Admitting: Registered Nurse

## 2018-10-31 DIAGNOSIS — H6690 Otitis media, unspecified, unspecified ear: Secondary | ICD-10-CM | POA: Diagnosis not present

## 2018-10-31 MED ORDER — AMOXICILLIN-POT CLAVULANATE 875-125 MG PO TABS: 1.0000 | ORAL_TABLET | Freq: Two times a day (BID) | ORAL | 0 refills | Status: DC

## 2018-10-31 NOTE — Progress Notes (Signed)
Telemedicine Encounter- SOAP NOTE Established Patient  This telephone encounter was conducted with the patient's (or proxy's) verbal consent via audio telecommunications: yes  Patient was instructed to have this encounter in a suitably private space; and to only have persons present to whom they give permission to participate. In addition, patient identity was confirmed by use of name plus two identifiers (DOB and address).  I discussed the limitations, risks, security and privacy concerns of performing an evaluation and management service by telephone and the availability of in person appointments. I also discussed with the patient that there may be a patient responsible charge related to this service. The patient expressed understanding and agreed to proceed.  I spent a total of 15 minutes talking with the patient or their proxy.  No chief complaint on file.   Subjective   Ann Santos is a 38 y.o. established patient. Telephone visit today for ear pain  HPI Onset was 3 weeks prior to visit. Pt states it started R ear, has moved to throat and sinuses, as well as jaw tenderness with chewing. It has been constant and worsening. Pt has not found agg/alleviating factors. Has not tried tx. Pt states she [place] in [place], but denies tenderness in outer ear, discharge. Denies hearing change, change in taste/smell, diarrhea, chest pain, SOB Endorses headache, nausea, feeling feverish.  Patient Active Problem List   Diagnosis Date Noted  . Sore throat 12/11/2017  . Streptococcal pharyngitis 12/11/2017  . Morbid obesity ([place]) 11/17/2014  . Atypical chest pain 07/17/2013  . Diverticular disease 10/23/2012  . Umbilical hernia 09/62/8366  . Anxiety 07/26/2011    Past Medical History:  Diagnosis Date  . Abdominal distention   . Abdominal pain   . Anxiety   . Blood in stool   . Constipation   . Diverticulitis    Per pt. States she never had  CT scan to confirm this  . Diverticulosis    PT H[facility]PITALIZED AT [facility] October 03, 2011  . Gestational hypertension 07/05/2013  . Gestational hypertension 07/05/2013  . History of chicken pox    childhood  . History of hiatal hernia   . Hypertension    During pregnancy only  . Maternal obesity, antepartum 07/05/2013  . Maternal obesity, antepartum 07/05/2013  . Numbness    first two fingers right hand   . Rectal bleeding   . Shortness of breath dyspnea    with exercise  . Umbilical hernia    planning surgical repair    Current Outpatient Medications  Medication Sig Dispense Refill  . acetaminophen (TYLENOL) 325 MG tablet Take 650 mg by mouth every 6 (six) hours as needed for headache.    Marland Kitchen CALCIUM PO Take 1 tablet daily by mouth.    . cetirizine (ZYRTEC) 10 MG tablet Take 10 mg by mouth daily as needed for allergies. Reported on 07/20/2015    . clobetasol ointment (TEMOVATE) 2.94 % Apply 1 application topically 2 (two) times daily. Only for 2 weeks. 30 g 0  . CRYSELLE-28 0.3-30 MG-MCG tablet Take 1 tablet by mouth daily. Reported on 07/20/2015  12  . Lisdexamfetamine Dimesylate (VYVANSE) 40 MG CHEW Chew 40 mg by mouth every morning. 30 tablet 0  . LYSINE PO Take 1 tablet by mouth daily.    . Multiple Vitamins-Minerals (WOMENS MULTIVITAMIN) TABS Take 1 tablet by mouth daily.    . mupirocin ointment (BACTROBAN) 2 % Place 1 application into the nose 2 (two)  times daily. 22 g 0  . NYSTATIN powder Apply 1 application topically daily as needed for skin breakdown.    . Probiotic Product (PROBIOTIC COLON SUPPORT PO) Take 1 tablet by mouth daily.    . sertraline (ZOLOFT) 100 MG tablet Take 1 tablet (100 mg total) by mouth at bedtime. 90 tablet 0  . traZODone (DESYREL) 100 MG tablet Take 1 tablet (100 mg total) by mouth at bedtime. 90 tablet 0   No current facility-administered medications for this visit.     No Known Allergies  Social History   Socioeconomic History  . Marital status:  Married    Spouse name: Not on file  . Number of children: 2  . Years of education: Not on file  . Highest education level: Not on file  Occupational History  . Not on file  Social Needs  . Financial resource strain: Not on file  . Food insecurity    Worry: Not on file    Inability: Not on file  . Transportation needs    Medical: Not on file    Non-medical: Not on file  Tobacco Use  . Smoking status: Never Smoker  . Smokeless tobacco: Never Used  Substance and Sexual Activity  . Alcohol use: Yes    Alcohol/week: 0.0 standard drinks    Comment: occ  . Drug use: No  . Sexual activity: Yes    Partners: Male    Birth control/protection: Pill  Lifestyle  . Physical activity    Days per week: Not on file    Minutes per session: Not on file  . Stress: Not on file  Relationships  . Social Musicianconnections    Talks on phone: Not on file    Gets together: Not on file    Attends religious service: Not on file    Active member of club or organization: Not on file    Attends meetings of clubs or organizations: Not on file    Relationship status: Not on file  . Intimate partner violence    Fear of current or ex partner: Not on file    Emotionally abused: Not on file    Physically abused: Not on file    Forced sexual activity: Not on file  Other Topics Concern  . Not on file  Social History Narrative  . Not on file    ROS Per hpi  Objective   Vitals as reported by the patient: There were no vitals filed for this visit.  Diagnoses and all orders for this visit:  Acute otitis media, unspecified otitis media type   PLAN:  Likely otitis media, appears to have spread through upper respiratory. No red flag symptoms at this time.  Discussed ER precautions  Discussed nonpharm and otc: oral hygiene, ensuring ears do not maintain water after swimming, otc analgesics, otc oral anesthetics if necessary  Suggest COVID test - not top differential, but worth ruling out given  symptoms  Patient encouraged to call clinic with any questions, comments, or concerns.    I discussed the assessment and treatment plan with the patient. The patient was provided an opportunity to ask questions and all were answered. The patient agreed with the plan and demonstrated an understanding of the instructions.   The patient was advised to call back or seek an in-person evaluation if the symptoms worsen or if the condition fails to improve as anticipated.  I provided 15 minutes of non-face-to-face time during this encounter.  Janeece Ageeichard Lavilla Delamora, NP  Primary  Care at [facility]

## 2018-11-01 ENCOUNTER — Telehealth: Payer: Self-pay | Admitting: *Deleted

## 2018-11-01 DIAGNOSIS — Z20822 Contact with and (suspected) exposure to covid-19: Secondary | ICD-10-CM

## 2018-11-01 NOTE — Telephone Encounter (Signed)
Ann Coss, NP  [facility] 22 hours ago (3:50 PM)   Ann Santos DOB Jun 01, 1980 MRN [id number]  Reason: headache, nausea, congestion x 3 weeks  Ins: BC[employer] ID: [id number]   Routing comment      Left patient a voicemail to call back to schedule COVID 19 test.  Order placed.

## 2018-11-05 ENCOUNTER — Telehealth: Payer: Self-pay | Admitting: Emergency Medicine

## 2018-11-06 ENCOUNTER — Other Ambulatory Visit: Payer: Self-pay

## 2018-11-06 DIAGNOSIS — R6889 Other general symptoms and signs: Secondary | ICD-10-CM | POA: Diagnosis not present

## 2018-11-06 DIAGNOSIS — Z20822 Contact with and (suspected) exposure to covid-19: Secondary | ICD-10-CM

## 2018-11-06 NOTE — Telephone Encounter (Signed)
error 

## 2018-11-11 DIAGNOSIS — H7291 Unspecified perforation of tympanic membrane, right ear: Secondary | ICD-10-CM | POA: Diagnosis not present

## 2018-11-11 LAB — NOVEL CORONAVIRUS, NAA: SARS-CoV-2, NAA: NOT DETECTED

## 2018-11-13 DIAGNOSIS — H93293 Other abnormal auditory perceptions, bilateral: Secondary | ICD-10-CM | POA: Diagnosis not present

## 2018-11-13 DIAGNOSIS — E041 Nontoxic single thyroid nodule: Secondary | ICD-10-CM | POA: Diagnosis not present

## 2018-11-13 DIAGNOSIS — M26623 Arthralgia of bilateral temporomandibular joint: Secondary | ICD-10-CM | POA: Diagnosis not present

## 2018-11-13 DIAGNOSIS — H9203 Otalgia, bilateral: Secondary | ICD-10-CM | POA: Diagnosis not present

## 2018-11-14 ENCOUNTER — Telehealth (HOSPITAL_COMMUNITY): Payer: Self-pay

## 2018-11-14 ENCOUNTER — Other Ambulatory Visit: Payer: Self-pay | Admitting: Otolaryngology

## 2018-11-14 DIAGNOSIS — F900 Attention-deficit hyperactivity disorder, predominantly inattentive type: Secondary | ICD-10-CM

## 2018-11-14 DIAGNOSIS — E041 Nontoxic single thyroid nodule: Secondary | ICD-10-CM

## 2018-11-14 MED ORDER — VYVANSE 40 MG PO CHEW: 40.0000 mg | CHEWABLE_TABLET | ORAL | 0 refills | Status: DC

## 2018-11-14 NOTE — Telephone Encounter (Signed)
done

## 2018-11-14 NOTE — Telephone Encounter (Signed)
Patient is calling for a refill on her Vyvanse chewable.

## 2018-11-15 ENCOUNTER — Ambulatory Visit: Payer: BC Managed Care – PPO | Admitting: Family Medicine

## 2018-11-20 ENCOUNTER — Ambulatory Visit
Admission: RE | Admit: 2018-11-20 | Discharge: 2018-11-20 | Disposition: A | Discharge status: Home / Self Care | Payer: BC Managed Care – PPO | Source: Ambulatory Visit | Attending: Otolaryngology | Admitting: Otolaryngology

## 2018-11-20 DIAGNOSIS — E041 Nontoxic single thyroid nodule: Secondary | ICD-10-CM

## 2018-11-26 ENCOUNTER — Encounter: Payer: Self-pay | Admitting: *Deleted

## 2018-11-29 ENCOUNTER — Ambulatory Visit (INDEPENDENT_AMBULATORY_CARE_PROVIDER_SITE_OTHER): Payer: BC Managed Care – PPO | Admitting: Psychiatry

## 2018-11-29 ENCOUNTER — Other Ambulatory Visit: Payer: Self-pay

## 2018-11-29 ENCOUNTER — Encounter (HOSPITAL_COMMUNITY): Payer: Self-pay | Admitting: Psychiatry

## 2018-11-29 DIAGNOSIS — F900 Attention-deficit hyperactivity disorder, predominantly inattentive type: Secondary | ICD-10-CM

## 2018-11-29 DIAGNOSIS — F330 Major depressive disorder, recurrent, mild: Secondary | ICD-10-CM | POA: Diagnosis not present

## 2018-11-29 DIAGNOSIS — F419 Anxiety disorder, unspecified: Secondary | ICD-10-CM | POA: Diagnosis not present

## 2018-11-29 MED ORDER — SERTRALINE HCL 100 MG PO TABS: 100.0000 mg | ORAL_TABLET | Freq: Every day | ORAL | 0 refills | Status: DC

## 2018-11-29 MED ORDER — VYVANSE 40 MG PO CHEW: 40.0000 mg | CHEWABLE_TABLET | ORAL | 0 refills | Status: DC

## 2018-11-29 MED ORDER — TRAZODONE HCL 100 MG PO TABS: 100.0000 mg | ORAL_TABLET | Freq: Every day | ORAL | 0 refills | Status: DC

## 2018-11-29 NOTE — Progress Notes (Signed)
Virtual Visit via Telephone Note  I connected with Ann Santos on 11/29/18 at  4:00 PM EDT by telephone and verified that I am speaking with the correct person using two identifiers.   I discussed the limitations, risks, security and privacy concerns of performing an evaluation and management service by telephone and the availability of in person appointments. I also discussed with the patient that there may be a patient responsible charge related to this service. The patient expressed understanding and agreed to proceed.   History of Present Illness: Patient was evaluated through phone conversation.  She is taking on Vyvanse chewable and she is pleased that it is working well.  She also started marriage counseling with Towanda Malkin and that is been going very well.  She is very pleased with the progress.  She endorsed that her relationship with her husband is going very well.  She endorsed some anxiety because she is not sure how the online school system will work.  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  Patient told there is no clear work-up done for online schooling and she is not sure what will happen.  She also busy taking care of her old kids.  She is sleeping good.  She did receive letter from pharmaceutical who makes Vyvanse that there was nothing wrong in the lot.  Patient changed her pharmacy and she is pleased with the progress.  She denies any irritability, anger, mania or any psychosis.  Her attention concentration is good.  She is able to do multitasking.  She has no tremors shakes or any EPS.  She wants to continue her current medication.  She denies drinking or using any illegal substances.   Past Psychiatric History: Reviewed. Started antidepressantin 2009when husbandgoing through drug addiction.Tried Celexa, Wellbutrin, Prozac and Strattera. Had a good response with Zoloft and trazodone.No H/O inpatient treatment, mania, psychosis, hallucination or  any suicidal attempt.   Psychiatric Specialty Exam: Physical Exam  ROS  There were no vitals taken for this visit.There is no height or weight on file to calculate BMI.  General Appearance: NA  Eye Contact:  NA  Speech:  Clear and Coherent  Volume:  Normal  Mood:  Anxious  Affect:  NA  Thought Process:  Coherent and Goal Directed  Orientation:  Full (Time, Place, and Person)  Thought Content:  Logical  Suicidal Thoughts:  No  Homicidal Thoughts:  No  Memory:  Immediate;   Good Recent;   Good Remote;   Good  Judgement:  Good  Insight:  Good  Psychomotor Activity:  NA  Concentration:  Concentration: Good and Attention Span: Good  Recall:  Good  Fund of Knowledge:  Good  Language:  Good  Akathisia:  No  Handed:  Right  AIMS (if indicated):     Assets:  Communication Skills Desire for Improvement Housing Social Support Talents/Skills Transportation  ADL's:  Intact  Cognition:  WNL  Sleep:   good      Assessment and Plan: Attention deficit disorder, inattentive type.  Major depressive disorder, recurrent.  Anxiety.  Patient is a stable on her current medication.  She has some anxiety due to upcoming online classes when school is open.  Reassurance given.  Encouraged to continue marital counseling with [place].  Discussed medication side effects and benefits.  Continue Vyvanse chewable 40 mg daily, trazodone 100 mg at bedtime and Zoloft 100 mg daily.  Recommended to call us back if she has any question or  any concern.  Follow-up in 3 months.  Follow Up Instructions:    I discussed the assessment and treatment plan with the patient. The patient was provided an opportunity to ask questions and all were answered. The patient agreed with the plan and demonstrated an understanding of the instructions.   The patient was advised to call back or seek an in-person evaluation if the symptoms worsen or if the condition fails to improve as anticipated.  I provided 20 minutes of  non-face-to-face time during this encounter.   Cleotis NipperSyed T Arfeen, MD

## 2018-12-05 ENCOUNTER — Encounter: Payer: Self-pay | Admitting: *Deleted

## 2019-01-07 DIAGNOSIS — R632 Polyphagia: Secondary | ICD-10-CM | POA: Diagnosis not present

## 2019-01-07 DIAGNOSIS — Z6839 Body mass index (BMI) 39.0-39.9, adult: Secondary | ICD-10-CM | POA: Diagnosis not present

## 2019-01-07 DIAGNOSIS — Z713 Dietary counseling and surveillance: Secondary | ICD-10-CM | POA: Diagnosis not present

## 2019-01-07 DIAGNOSIS — E669 Obesity, unspecified: Secondary | ICD-10-CM | POA: Diagnosis not present

## 2019-01-14 ENCOUNTER — Telehealth (HOSPITAL_COMMUNITY): Payer: Self-pay

## 2019-01-14 DIAGNOSIS — F900 Attention-deficit hyperactivity disorder, predominantly inattentive type: Secondary | ICD-10-CM

## 2019-01-14 NOTE — Telephone Encounter (Signed)
Patient is calling for a refill on her Vyvanse, she wants to know if you can increase to 50 mg, patient states she is taking it around 10:30 -11 am in order for it to last all day, please review and advise, thank you

## 2019-01-15 MED ORDER — VYVANSE 40 MG PO CHEW: 40.0000 mg | CHEWABLE_TABLET | ORAL | 0 refills | Status: DC

## 2019-01-15 NOTE — Telephone Encounter (Signed)
Refill send. No increase at this time. Will discuss on her next schedule appointment.

## 2019-01-30 DIAGNOSIS — E669 Obesity, unspecified: Secondary | ICD-10-CM | POA: Diagnosis not present

## 2019-01-30 DIAGNOSIS — K429 Umbilical hernia without obstruction or gangrene: Secondary | ICD-10-CM | POA: Diagnosis not present

## 2019-01-30 DIAGNOSIS — Z713 Dietary counseling and surveillance: Secondary | ICD-10-CM | POA: Diagnosis not present

## 2019-02-14 ENCOUNTER — Telehealth (HOSPITAL_COMMUNITY): Payer: Self-pay

## 2019-02-14 DIAGNOSIS — F900 Attention-deficit hyperactivity disorder, predominantly inattentive type: Secondary | ICD-10-CM

## 2019-02-14 MED ORDER — VYVANSE 40 MG PO CHEW: 40.0000 mg | CHEWABLE_TABLET | ORAL | 0 refills | Status: DC

## 2019-02-14 NOTE — Telephone Encounter (Signed)
Patient is calling for a refill on her Vyvanse. 

## 2019-02-14 NOTE — Telephone Encounter (Signed)
Done. [facility] to [facility].

## 2019-02-20 ENCOUNTER — Telehealth (HOSPITAL_COMMUNITY): Payer: Self-pay

## 2019-02-20 NOTE — Telephone Encounter (Signed)
She can try hydroxyzine 25 mg capsule to take 1-2 as needed.  She is to be careful because antianxiety medicine can cause sedation.  If she agreed then please call to the local pharmacy [id number].

## 2019-02-20 NOTE — Telephone Encounter (Signed)
Patient called and stated that she needs Xanax or something for anxiety. She stated that her mom is going to palliative care and she's having a difficult time with it. She said she's struggling to sleep and is taking a lot of Trazodone because of it. She also stated that she's hyperventilating and that it's a situational thing and needs something temporarily. Please review and advise. Thank you.

## 2019-02-22 ENCOUNTER — Other Ambulatory Visit (HOSPITAL_COMMUNITY): Payer: Self-pay

## 2019-02-22 MED ORDER — HYDROXYZINE PAMOATE 25 MG PO CAPS: ORAL_CAPSULE | ORAL | 0 refills | Status: DC

## 2019-02-22 NOTE — Telephone Encounter (Signed)
Done

## 2019-03-01 ENCOUNTER — Other Ambulatory Visit (HOSPITAL_COMMUNITY): Payer: Self-pay | Admitting: Psychiatry

## 2019-03-01 DIAGNOSIS — F330 Major depressive disorder, recurrent, mild: Secondary | ICD-10-CM

## 2019-03-01 DIAGNOSIS — F419 Anxiety disorder, unspecified: Secondary | ICD-10-CM

## 2019-03-04 ENCOUNTER — Ambulatory Visit (INDEPENDENT_AMBULATORY_CARE_PROVIDER_SITE_OTHER): Payer: BC Managed Care – PPO | Admitting: Psychiatry

## 2019-03-04 ENCOUNTER — Encounter (HOSPITAL_COMMUNITY): Payer: Self-pay | Admitting: Psychiatry

## 2019-03-04 ENCOUNTER — Other Ambulatory Visit: Payer: Self-pay

## 2019-03-04 DIAGNOSIS — F419 Anxiety disorder, unspecified: Secondary | ICD-10-CM

## 2019-03-04 DIAGNOSIS — F330 Major depressive disorder, recurrent, mild: Secondary | ICD-10-CM

## 2019-03-04 DIAGNOSIS — F900 Attention-deficit hyperactivity disorder, predominantly inattentive type: Secondary | ICD-10-CM | POA: Diagnosis not present

## 2019-03-04 MED ORDER — VYVANSE 40 MG PO CHEW: 40.0000 mg | CHEWABLE_TABLET | ORAL | 0 refills | Status: DC

## 2019-03-04 MED ORDER — TRAZODONE HCL 100 MG PO TABS: 100.0000 mg | ORAL_TABLET | Freq: Every day | ORAL | 0 refills | Status: DC

## 2019-03-04 MED ORDER — LORAZEPAM 0.5 MG PO TABS: 0.5000 mg | ORAL_TABLET | Freq: Every day | ORAL | 0 refills | Status: DC | PRN

## 2019-03-04 MED ORDER — SERTRALINE HCL 100 MG PO TABS: 100.0000 mg | ORAL_TABLET | Freq: Every day | ORAL | 0 refills | Status: DC

## 2019-03-04 NOTE — Progress Notes (Signed)
Virtual Visit via Telephone Note  I connected with Ann Santos on 03/04/19 at  4:00 PM EST by telephone and verified that I am speaking with the correct person using two identifiers.   I discussed the limitations, risks, security and privacy concerns of performing an evaluation and management service by telephone and the availability of in person appointments. I also discussed with the patient that there may be a patient responsible charge related to this service. The patient expressed understanding and agreed to proceed.   History of Present Illness: Patient was evaluated by phone session.  She is upset and sad.  Her mother died last week who has health issues.  She requested to get Xanax and we prescribed hydroxyzine and she was not happy about it.  She is also upset because the message was delivered very late according to her and she was not able to use it anyway.  She admitted some anxiety and crying spells because losing her mother.  However she is pleased the funeral went well.  She is complaining about lack of communication or delayed communication and I explained that we have shortstaffed and we are working on it.  I also offered if she wants to transfer her care to other office where she like to stay in our office since medicine seems to be working.  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  Her daughter is also doing well and she reported that relationship with the husband is the best.  She is seeing her therapist Orson Gear regularly.  Her sleep is good.  Her anxiety is little high because of recent loss of mother.  Her attention, focus and multitasking is good.  She also upset with the pharmacy because sometimes prescriptions are not ready.  I explained that she need to choose one pharmacy so we can send the prescription at one place.  [facility].  Patient decided to stay  in our office because she does not want to change the psychiatrist.  She denies any mania, psychosis, hallucination.  She denies drinking or using any illegal substances.  She has no tremors, shakes or any EPS.   Past Psychiatric History:Reviewed. Started antidepressantin 2009when husbandgoing through drug addiction.Tried Celexa, Wellbutrin, Prozac and Strattera. Had a good response with Zoloft and trazodone.No H/Oinpatient treatment,mania, psychosis, hallucination or any suicidal attempt.  Psychiatric Specialty Exam: Physical Exam  ROS  There were no vitals taken for this visit.There is no height or weight on file to calculate BMI.  General Appearance: NA  Eye Contact:  NA  Speech:  Normal Rate  Volume:  Normal  Mood:  Dysphoric, Irritable and emotional  Affect:  NA  Thought Process:  Descriptions of Associations: Intact  Orientation:  Full (Time, Place, and Person)  Thought Content:  Rumination  Suicidal Thoughts:  No  Homicidal Thoughts:  No  Memory:  Immediate;   Good Recent;   Good Remote;   Good  Judgement:  Fair  Insight:  Good  Psychomotor Activity:  NA  Concentration:  Concentration: Good and Attention Span: Good  Recall:  Good  Fund of Knowledge:  Good  Language:  Good  Akathisia:  No  Handed:  Right  AIMS (if indicated):     Assets:  Communication Skills Desire for Improvement Housing Resilience Social Support  ADL's:  Intact  Cognition:  WNL  Sleep:  ok      Assessment and Plan: Attention deficit disorder, inattentive type.  Major depressive disorder, recurrent.  Anxiety.  Grief.  Discussed recent loss of the mother.  Also explained and clarify communication between the staff, provider and patient.  I also clarified that she need to choose one pharmacy rather than multiple pharmacy for medication.  [facility].  She is requesting something to help her anxiety which has increased due to recent  loss of her mother.  She had trialed hydroxyzine in the past but it gives her opposite and paradoxical reaction.  I explained that we can try low-dose Ativan to help her anxiety for temporarily and she agreed.  I also encouraged that she should talk to her therapist about grief.  Discussed medication side effects and benefits specially benzodiazepine and stimulant abuse tolerance and withdrawal.  Continue Vyvanse chewable 40 mg in the morning, trazodone 100 mg at bedtime and Zoloft 100 mg daily.  Recommended to call us back if she has any question or any concern.  Follow-up in 3 months.  Time spent 30 minutes.  More than 50% of the time spent in psychoeducation, counseling, coronation of care, discussing long-term prognosis of her illness.  Follow Up Instructions:    I discussed the assessment and treatment plan with the patient. The patient was provided an opportunity to ask questions and all were answered. The patient agreed with the plan and demonstrated an understanding of the instructions.   The patient was advised to call back or seek an in-person evaluation if the symptoms worsen or if the condition fails to improve as anticipated.  I provided 20 minutes of non-face-to-face time during this encounter.   Cleotis Nipper, MD

## 2019-03-06 DIAGNOSIS — Z713 Dietary counseling and surveillance: Secondary | ICD-10-CM | POA: Diagnosis not present

## 2019-03-06 DIAGNOSIS — E669 Obesity, unspecified: Secondary | ICD-10-CM | POA: Diagnosis not present

## 2019-03-06 DIAGNOSIS — Z6837 Body mass index (BMI) 37.0-37.9, adult: Secondary | ICD-10-CM | POA: Diagnosis not present

## 2019-03-11 DIAGNOSIS — R635 Abnormal weight gain: Secondary | ICD-10-CM | POA: Diagnosis not present

## 2019-03-11 DIAGNOSIS — Z713 Dietary counseling and surveillance: Secondary | ICD-10-CM | POA: Diagnosis not present

## 2019-03-11 DIAGNOSIS — E669 Obesity, unspecified: Secondary | ICD-10-CM | POA: Diagnosis not present

## 2019-03-11 DIAGNOSIS — R632 Polyphagia: Secondary | ICD-10-CM | POA: Diagnosis not present

## 2019-04-06 DIAGNOSIS — Z01818 Encounter for other preprocedural examination: Secondary | ICD-10-CM | POA: Diagnosis not present

## 2019-04-10 DIAGNOSIS — Z6839 Body mass index (BMI) 39.0-39.9, adult: Secondary | ICD-10-CM | POA: Diagnosis not present

## 2019-04-10 DIAGNOSIS — F419 Anxiety disorder, unspecified: Secondary | ICD-10-CM | POA: Diagnosis not present

## 2019-04-10 DIAGNOSIS — K439 Ventral hernia without obstruction or gangrene: Secondary | ICD-10-CM | POA: Diagnosis not present

## 2019-04-10 DIAGNOSIS — K429 Umbilical hernia without obstruction or gangrene: Secondary | ICD-10-CM | POA: Diagnosis not present

## 2019-04-10 DIAGNOSIS — E65 Localized adiposity: Secondary | ICD-10-CM | POA: Diagnosis not present

## 2019-04-10 DIAGNOSIS — K436 Other and unspecified ventral hernia with obstruction, without gangrene: Secondary | ICD-10-CM | POA: Diagnosis not present

## 2019-04-10 DIAGNOSIS — Z9884 Bariatric surgery status: Secondary | ICD-10-CM | POA: Diagnosis not present

## 2019-04-10 DIAGNOSIS — K420 Umbilical hernia with obstruction, without gangrene: Secondary | ICD-10-CM | POA: Diagnosis not present

## 2019-04-10 DIAGNOSIS — E669 Obesity, unspecified: Secondary | ICD-10-CM | POA: Diagnosis not present

## 2019-04-17 ENCOUNTER — Telehealth (HOSPITAL_COMMUNITY): Payer: Self-pay | Admitting: *Deleted

## 2019-04-17 DIAGNOSIS — F900 Attention-deficit hyperactivity disorder, predominantly inattentive type: Secondary | ICD-10-CM

## 2019-04-17 NOTE — Telephone Encounter (Signed)
Patient called stating she needs a refill of Vyvanse. Chart reivewed, next appointment 2/8 with Dr. Adele Schilder, message sent.

## 2019-04-24 MED ORDER — VYVANSE 40 MG PO CHEW: 40.0000 mg | CHEWABLE_TABLET | ORAL | 0 refills | Status: DC

## 2019-05-03 DIAGNOSIS — I82412 Acute embolism and thrombosis of left femoral vein: Secondary | ICD-10-CM | POA: Diagnosis not present

## 2019-05-03 DIAGNOSIS — I8002 Phlebitis and thrombophlebitis of superficial vessels of left lower extremity: Secondary | ICD-10-CM | POA: Diagnosis not present

## 2019-05-11 DIAGNOSIS — Z20828 Contact with and (suspected) exposure to other viral communicable diseases: Secondary | ICD-10-CM | POA: Diagnosis not present

## 2019-05-21 ENCOUNTER — Telehealth (HOSPITAL_COMMUNITY): Payer: Self-pay | Admitting: *Deleted

## 2019-05-21 DIAGNOSIS — F900 Attention-deficit hyperactivity disorder, predominantly inattentive type: Secondary | ICD-10-CM

## 2019-05-21 NOTE — Telephone Encounter (Signed)
Received message from pt requesting refill on pt VyVanse 40mg . Pt has an upcoming appointment on 06/03/19. Please review.

## 2019-05-24 ENCOUNTER — Telehealth (HOSPITAL_COMMUNITY): Payer: Self-pay | Admitting: *Deleted

## 2019-05-24 DIAGNOSIS — F900 Attention-deficit hyperactivity disorder, predominantly inattentive type: Secondary | ICD-10-CM

## 2019-05-24 MED ORDER — VYVANSE 40 MG PO CHEW: 40.0000 mg | CHEWABLE_TABLET | ORAL | 0 refills | Status: DC

## 2019-05-24 NOTE — Telephone Encounter (Signed)
[facility]

## 2019-05-24 NOTE — Telephone Encounter (Signed)
Received phone call from pt requesting a refill on VyVanse 40mg  Chew last filled 04/24/19. Pt has an upcoming appointment on 06/03/19. [facility] [address]. Please review.

## 2019-05-24 NOTE — Addendum Note (Signed)
Addended by: Kathryne Sharper T on: 05/24/2019 10:01 AM   Modules accepted: Orders

## 2019-05-29 ENCOUNTER — Other Ambulatory Visit (HOSPITAL_COMMUNITY): Payer: Self-pay | Admitting: Psychiatry

## 2019-05-29 DIAGNOSIS — F419 Anxiety disorder, unspecified: Secondary | ICD-10-CM

## 2019-05-29 DIAGNOSIS — F330 Major depressive disorder, recurrent, mild: Secondary | ICD-10-CM

## 2019-05-31 ENCOUNTER — Other Ambulatory Visit (HOSPITAL_COMMUNITY): Payer: Self-pay | Admitting: Psychiatry

## 2019-05-31 DIAGNOSIS — F330 Major depressive disorder, recurrent, mild: Secondary | ICD-10-CM

## 2019-06-03 ENCOUNTER — Ambulatory Visit (HOSPITAL_COMMUNITY): Payer: BC Managed Care – PPO | Admitting: Psychiatry

## 2019-06-03 ENCOUNTER — Other Ambulatory Visit: Payer: Self-pay

## 2019-06-21 ENCOUNTER — Other Ambulatory Visit: Payer: Self-pay

## 2019-06-21 ENCOUNTER — Encounter (HOSPITAL_COMMUNITY): Payer: Self-pay | Admitting: Psychiatry

## 2019-06-21 ENCOUNTER — Ambulatory Visit (INDEPENDENT_AMBULATORY_CARE_PROVIDER_SITE_OTHER): Payer: BC Managed Care – PPO | Admitting: Psychiatry

## 2019-06-21 DIAGNOSIS — F900 Attention-deficit hyperactivity disorder, predominantly inattentive type: Secondary | ICD-10-CM | POA: Diagnosis not present

## 2019-06-21 DIAGNOSIS — F419 Anxiety disorder, unspecified: Secondary | ICD-10-CM

## 2019-06-21 DIAGNOSIS — F330 Major depressive disorder, recurrent, mild: Secondary | ICD-10-CM

## 2019-06-21 MED ORDER — VYVANSE 40 MG PO CHEW: 40.0000 mg | CHEWABLE_TABLET | ORAL | 0 refills | Status: DC

## 2019-06-21 MED ORDER — TRAZODONE HCL 150 MG PO TABS: ORAL_TABLET | ORAL | 0 refills | Status: DC

## 2019-06-21 MED ORDER — SERTRALINE HCL 100 MG PO TABS: ORAL_TABLET | ORAL | 0 refills | Status: DC

## 2019-06-21 NOTE — Progress Notes (Signed)
Virtual Visit via Telephone Note  I connected with Ann Santos on 06/21/19 at  8:40 AM EST by telephone and verified that I am speaking with the correct person using two identifiers.   I discussed the limitations, risks, security and privacy concerns of performing an evaluation and management service by telephone and the availability of in person appointments. I also discussed with the patient that there may be a patient responsible charge related to this service. The patient expressed understanding and agreed to proceed.   History of Present Illness: Patient was evaluated by phone session.  She had some issues related to our office regarding communication.  She had appointment with a nurse practitioner for medication management but on the last minute she find out that her insurance did not approve the visit.  She also tried to transfer out her case to the other office but it did not go through.  Patient now feels comfortable staying in our office since things are going well.  She is happy because she recently got because of the year award.  She reported her attention, concentration is good.  She is able to do multitasking and very happy with her job.  She is in therapy with Hilarie Fredrickson and that is working very well.  She reported her holidays were very difficult because that was the first holiday after she lost her mother in November and 1 month later her step mother died.  In 05-10-2022 she had a partially elective procedure to remove excessive skin and also had hernia surgery.  Patient told almost 12 pounds of skin was removed and she had lost significant weight.  She is happy but also reported there are nights when she has difficulty sleeping.  She is taking trazodone 100 mg but wondering if dose can further increase to 150.  She is hoping once sleep pattern get better then she may go back to take trazodone only as needed.  She denies any irritability, anger, mania, psychosis or any hallucination.   Her relationship with her husband is going well.  She is happy that therapy is working.  She has no tremors, shakes or any EPS.  Her energy level is good.    Past Psychiatric History:Reviewed. Started antidepressantin 2009when husbandgoing through drug addiction.Tried Celexa, Wellbutrin, Prozac and Strattera. Had a good response with Zoloft and trazodone.No H/Oinpatient treatment,mania, psychosis, hallucination or any suicidal attempt.    Psychiatric Specialty Exam: Physical Exam  Review of Systems  There were no vitals taken for this visit.There is no height or weight on file to calculate BMI.  General Appearance: NA  Eye Contact:  NA  Speech:  Clear and Coherent and Normal Rate  Volume:  Normal  Mood:  Euthymic  Affect:  NA  Thought Process:  Goal Directed  Orientation:  Full (Time, Place, and Person)  Thought Content:  WDL and Logical  Suicidal Thoughts:  No  Homicidal Thoughts:  No  Memory:  Immediate;   Good Recent;   Good Remote;   Good  Judgement:  Intact  Insight:  Present  Psychomotor Activity:  NA  Concentration:  Concentration: Good and Attention Span: Good  Recall:  Good  Fund of Knowledge:  Good  Language:  Good  Akathisia:  No  Handed:  Right  AIMS (if indicated):     Assets:  Communication Skills Desire for Improvement Housing Social Support Talents/Skills Transportation  ADL's:  Intact  Cognition:  WNL  Sleep:   fair      Assessment  and Plan: Attention deficit disorder, inattentive type.  Major depressive disorder, recurrent.  Anxiety.  Discussed current medication.  Patient had lost significant amount of weight as she is trying to lose weight.  She also had procedure to remove skin and lost another 12 pounds.  Her weight is now 190 pounds.  She had decided to stay in our office as she noticed things are better now.  I will continue Vyvanse 40 mg chewable to take every day.  We will try trazodone 150 mg since 100 mg is not effective  sometimes.  Continue Zoloft 100 mg daily.  Discussed medication side effect specially stimulant abuse, tolerance and withdrawal.  Encouraged to continue therapy with Cedric Fishman.  Recommended to call us back if she is any question of any concern.  Follow-up in 3 months.    Follow Up Instructions:    I discussed the assessment and treatment plan with the patient. The patient was provided an opportunity to ask questions and all were answered. The patient agreed with the plan and demonstrated an understanding of the instructions.   The patient was advised to call back or seek an in-person evaluation if the symptoms worsen or if the condition fails to improve as anticipated.  I provided 20 minutes of non-face-to-face time during this encounter.   Cleotis Nipper, MD

## 2019-07-18 ENCOUNTER — Telehealth (HOSPITAL_COMMUNITY): Payer: Self-pay

## 2019-07-18 DIAGNOSIS — F900 Attention-deficit hyperactivity disorder, predominantly inattentive type: Secondary | ICD-10-CM

## 2019-07-18 MED ORDER — VYVANSE 40 MG PO CHEW: 40.0000 mg | CHEWABLE_TABLET | ORAL | 0 refills | Status: DC

## 2019-07-18 NOTE — Telephone Encounter (Signed)
Done

## 2019-07-18 NOTE — Telephone Encounter (Signed)
Patient called requesting a refill on her Vyvanse 40mg  [facility] [address] [facility] [place]. She has a scheduled appointment on 09/18/19. Thank you.

## 2019-07-24 NOTE — Telephone Encounter (Signed)
Had notified patient 

## 2019-07-30 DIAGNOSIS — I82409 Acute embolism and thrombosis of unspecified deep veins of unspecified lower extremity: Secondary | ICD-10-CM | POA: Diagnosis not present

## 2019-07-30 DIAGNOSIS — I8290 Acute embolism and thrombosis of unspecified vein: Secondary | ICD-10-CM | POA: Diagnosis not present

## 2019-08-21 ENCOUNTER — Telehealth (HOSPITAL_COMMUNITY): Payer: Self-pay

## 2019-08-21 DIAGNOSIS — F900 Attention-deficit hyperactivity disorder, predominantly inattentive type: Secondary | ICD-10-CM

## 2019-08-21 MED ORDER — VYVANSE 40 MG PO CHEW: 40.0000 mg | CHEWABLE_TABLET | ORAL | 0 refills | Status: DC

## 2019-08-21 NOTE — Telephone Encounter (Signed)
Done

## 2019-08-21 NOTE — Telephone Encounter (Signed)
Pt called requesting refill of Vyvanse. Last filled 3/25. Next appointment 5/26. If appropriate [facility] [employer] on [address].

## 2019-08-28 DIAGNOSIS — Z6838 Body mass index (BMI) 38.0-38.9, adult: Secondary | ICD-10-CM | POA: Diagnosis not present

## 2019-08-28 DIAGNOSIS — Z01419 Encounter for gynecological examination (general) (routine) without abnormal findings: Secondary | ICD-10-CM | POA: Diagnosis not present

## 2019-08-29 ENCOUNTER — Encounter: Payer: Self-pay | Admitting: Emergency Medicine

## 2019-08-29 ENCOUNTER — Other Ambulatory Visit: Payer: Self-pay

## 2019-08-29 ENCOUNTER — Ambulatory Visit: Payer: BC Managed Care – PPO | Admitting: Emergency Medicine

## 2019-08-29 VITALS — BP 127/85 | HR 88 | Temp 98.2°F | Resp 16 | Ht 62.0 in | Wt 209.0 lb

## 2019-08-29 DIAGNOSIS — S161XXA Strain of muscle, fascia and tendon at neck level, initial encounter: Secondary | ICD-10-CM | POA: Diagnosis not present

## 2019-08-29 DIAGNOSIS — M62838 Other muscle spasm: Secondary | ICD-10-CM | POA: Diagnosis not present

## 2019-08-29 DIAGNOSIS — M542 Cervicalgia: Secondary | ICD-10-CM | POA: Diagnosis not present

## 2019-08-29 DIAGNOSIS — Z86718 Personal history of other venous thrombosis and embolism: Secondary | ICD-10-CM | POA: Diagnosis not present

## 2019-08-29 DIAGNOSIS — Z7901 Long term (current) use of anticoagulants: Secondary | ICD-10-CM

## 2019-08-29 MED ORDER — TRAMADOL HCL 50 MG PO TABS: 50.0000 mg | ORAL_TABLET | Freq: Three times a day (TID) | ORAL | 0 refills | Status: AC | PRN

## 2019-08-29 MED ORDER — CYCLOBENZAPRINE HCL 10 MG PO TABS: 10.0000 mg | ORAL_TABLET | Freq: Three times a day (TID) | ORAL | 0 refills | Status: DC | PRN

## 2019-08-29 MED ORDER — ONDANSETRON 4 MG PO TBDP: 4.0000 mg | ORAL_TABLET | Freq: Three times a day (TID) | ORAL | 0 refills | Status: DC | PRN

## 2019-08-29 NOTE — Progress Notes (Signed)
Ann Santos 39 y.o.   Chief Complaint  Patient presents with  . Neck Pain    for 4 days unable to swallow    HISTORY OF PRESENT ILLNESS: This is a 39 y.o. female complaining of bilateral neck pain for 4 days.  Sharp constant pain without radiation, swallowing makes it worse, tried over-the-counter analgesics without much relief.  No other associated symptoms. Exercises regularly, mostly resistance training. Denies fever or chills.  Denies flulike symptoms. Presently taking Eliquis 5 mg twice a day due to DVT found on left leg last January.  Plan is to continue for a total of 6 months.  NSAIDs contraindicated.  HPI   Prior to Admission medications   Medication Sig Start Date End Date Taking? Authorizing Provider  acetaminophen (TYLENOL) 325 MG tablet Take 650 mg by mouth every 6 (six) hours as needed for headache.   Yes [provider]  apixaban (ELIQUIS) 5 MG TABS tablet Take 5 mg by mouth 2 (two) times daily.   Yes [provider]  CALCIUM PO Take 1 tablet daily by mouth.   Yes [provider]  cetirizine (ZYRTEC) 10 MG tablet Take 10 mg by mouth daily as needed for allergies. Reported on 07/20/2015   Yes [provider]  CRYSELLE-28 0.3-30 MG-MCG tablet Take 1 tablet by mouth daily. Reported on 07/20/2015 10/30/14  Yes [provider]  Cyanocobalamin (VITAMIN B 12 PO) Take by mouth daily.   Yes [provider]  Lisdexamfetamine Dimesylate (VYVANSE) 40 MG CHEW Chew 40 mg by mouth every morning. 08/21/19  Yes Arfeen, Phillips Grout, MD  LYSINE PO Take 1 tablet by mouth daily.   Yes [provider]  Multiple Vitamins-Minerals (WOMENS MULTIVITAMIN) TABS Take 1 tablet by mouth daily.   Yes [provider]  NYSTATIN powder Apply 1 application topically daily as needed for skin breakdown. 03/29/18  Yes [provider]  Probiotic Product (PROBIOTIC COLON SUPPORT PO) Take 1 tablet by mouth daily.   Yes [provider]  sertraline (ZOLOFT) 100 MG tablet TAKE 1 TABLET BY MOUTH EVERYDAY AT BEDTIME 06/21/19  Yes Arfeen, Phillips Grout, MD  traZODone (DESYREL) 150 MG tablet TAKE 1 TABLET BY MOUTH EVERYDAY AT BEDTIME 06/21/19  Yes Arfeen, Phillips Grout, MD  clobetasol ointment (TEMOVATE) 0.05 % Apply 1 application topically 2 (two) times daily. Only for 2 weeks. Patient not taking: Reported on 08/29/2019 07/24/18   Doristine Bosworth, MD  LORazepam (ATIVAN) 0.5 MG tablet Take 1 tablet (0.5 mg total) by mouth daily as needed for anxiety. Patient not taking: Reported on 08/29/2019 03/04/19 03/03/20  Arfeen, Phillips Grout, MD  mupirocin ointment (BACTROBAN) 2 % Place 1 application into the nose 2 (two) times daily. Patient not taking: Reported on 08/29/2019 07/24/18   Doristine Bosworth, MD    No Known Allergies  Patient Active Problem List   Diagnosis Date Noted  . Morbid obesity (HCC) 11/17/2014  . Diverticular disease 10/23/2012  . Umbilical hernia 10/26/2011    Past Medical History:  Diagnosis Date  . Abdominal distention   . Abdominal pain   . Anxiety   . Blood in stool   . Constipation   . Diverticulitis    Per pt. States she never had CT scan to confirm this  . Diverticulosis    PT HOSPITALIZED AT [facility] October 03, 2011  . Gestational hypertension 07/05/2013  . Gestational hypertension 07/05/2013  . History of chicken pox    childhood  . History of  hiatal hernia   . Hypertension    During pregnancy only  . Maternal obesity, antepartum 07/05/2013  . Maternal obesity, antepartum 07/05/2013  . Numbness    first two fingers right hand   . Rectal bleeding   . Shortness of breath dyspnea    with exercise  . Umbilical hernia    planning surgical repair    Past Surgical History:  Procedure Laterality Date  . BREATH TEK H PYLORI N/A 07/22/2014   Procedure: BREATH TEK H PYLORI;  Surgeon: Alphonsa Overall, MD;  Location: [facility];  Service: General;  Laterality: N/A;  . CESAREAN SECTION  05/28/09  . CESAREAN SECTION N/A 07/05/2013    Procedure: REPEAT CESAREAN SECTION (With Special Wound Vac);  Surgeon: Elveria Royals, MD;  Location: [facility];  Service: Obstetrics;  Laterality: N/A;   EDD: 07/23/13  . COLONOSCOPY  10/07/2011   Procedure: COLONOSCOPY;  Surgeon: Beryle Beams, MD;  Location: [facility];  Service: Endoscopy;  Laterality: N/A;  . ESOPHAGOGASTRODUODENOSCOPY  10/07/2011   Procedure: ESOPHAGOGASTRODUODENOSCOPY (EGD);  Surgeon: Beryle Beams, MD;  Location: [facility];  Service: Endoscopy;  Laterality: N/A;  . LAPAROSCOPIC GASTRIC SLEEVE RESECTION WITH HIATAL HERNIA REPAIR N/A 11/17/2014   Procedure: LAPAROSCOPIC GASTRIC SLEEVE RESECTION ;  Surgeon: Alphonsa Overall, MD;  Location: [facility];  Service: General;  Laterality: N/A;  . SCAR REVISION  09/17/09   c-section scar revision  . UMBILICAL HERNIA REPAIR  11/01/2011   Procedure: HERNIA REPAIR UMBILICAL ADULT;  Surgeon: Earnstine Regal, MD;  Location: [facility];  Service: General;  Laterality: N/A;    Social History   Socioeconomic History  . Marital status: Married    Spouse name: Not on file  . Number of children: 2  . Years of education: Not on file  . Highest education level: Not on file  Occupational History  . Not on file  Tobacco Use  . Smoking status: Never Smoker  . Smokeless tobacco: Never Used  Substance and Sexual Activity  . Alcohol use: Yes    Alcohol/week: 0.0 standard drinks    Comment: occ  . Drug use: No  . Sexual activity: Yes    Partners: Male    Birth control/protection: Pill  Other Topics Concern  . Not on file  Social History Narrative  . Not on file   Social Determinants of Health   Financial Resource Strain:   . Difficulty of Paying Living Expenses:   Food Insecurity:   . Worried About Charity fundraiser in the Last Year:   . Arboriculturist in the Last Year:   Transportation Needs:   . Film/video editor (Medical):   Marland Kitchen Lack of Transportation (Non-Medical):   Physical Activity:   . Days of Exercise per Week:   .  Minutes of Exercise per Session:   Stress:   . Feeling of Stress :   Social Connections:   . Frequency of Communication with Friends and Family:   . Frequency of Social Gatherings with Friends and Family:   . Attends Religious Services:   . Active Member of Clubs or Organizations:   . Attends Archivist Meetings:   Marland Kitchen Marital Status:   Intimate Partner Violence:   . Fear of Current or Ex-Partner:   . Emotionally Abused:   Marland Kitchen Physically Abused:   . Sexually Abused:     Family History  Problem Relation Age of Onset  . Diabetes Mother   . Hypertension Mother   .  Ulcerative colitis Father   . Hypertension Father   . Cancer Maternal Grandmother        lung     Review of Systems  Constitutional: Negative.  Negative for chills and fever.  HENT: Negative.  Negative for congestion, nosebleeds and sore throat.   Respiratory: Negative.  Negative for cough and shortness of breath.   Cardiovascular: Negative.  Negative for chest pain and palpitations.  Gastrointestinal: Negative for abdominal pain, diarrhea, nausea and vomiting.  Genitourinary: Negative.  Negative for dysuria and hematuria.  Musculoskeletal: Positive for neck pain.  Skin: Negative.  Negative for rash.  Neurological: Negative.  Negative for dizziness and headaches.  All other systems reviewed and are negative.  Today's Vitals   08/29/19 0928  BP: 127/85  Pulse: 88  Resp: 16  Temp: 98.2 F (36.8 C)  TempSrc: Temporal  SpO2: 96%  Weight: 209 lb (94.8 kg)  Height: 5\' 2"  (1.575 m)   Body mass index is 38.23 kg/m.   Physical Exam Vitals reviewed.  Constitutional:      Appearance: Normal appearance.  HENT:     Head: Normocephalic.  Eyes:     Extraocular Movements: Extraocular movements intact.     Pupils: Pupils are equal, round, and reactive to light.  Neck:     Thyroid: No thyroid mass.     Vascular: No carotid bruit.     Trachea: Trachea normal.  Cardiovascular:     Rate and Rhythm: Normal  rate and regular rhythm.     Heart sounds: Normal heart sounds.  Pulmonary:     Effort: Pulmonary effort is normal.     Breath sounds: Normal breath sounds.  Musculoskeletal:        General: Normal range of motion.     Cervical back: No erythema. Pain with movement and muscular tenderness present. No spinous process tenderness.  Lymphadenopathy:     Cervical: No cervical adenopathy.  Skin:    General: Skin is warm and dry.     Capillary Refill: Capillary refill takes less than 2 seconds.  Neurological:     General: No focal deficit present.     Mental Status: She is alert and oriented to person, place, and time.  Psychiatric:        Mood and Affect: Mood normal.        Behavior: Behavior normal.      ASSESSMENT & PLAN: Raelynn was seen today for neck pain.  Diagnoses and all orders for this visit:  Musculoskeletal neck pain -     traMADol (ULTRAM) 50 MG tablet; Take 1 tablet (50 mg total) by mouth every 8 (eight) hours as needed for up to 5 days.  Muscle spasm -     cyclobenzaprine (FLEXERIL) 10 MG tablet; Take 1 tablet (10 mg total) by mouth 3 (three) times daily as needed for muscle spasms.  Strain of neck muscle, initial encounter  History of DVT (deep vein thrombosis)  On continuous oral anticoagulation  Other orders -     ondansetron (ZOFRAN ODT) 4 MG disintegrating tablet; Take 1 tablet (4 mg total) by mouth every 8 (eight) hours as needed for nausea or vomiting.    Patient Instructions       If you have lab work done today you will be contacted with your lab results within the next 2 weeks.  If you have not heard from us then please contact us. The fastest way to get your results is to register for My Chart.  IF you received an x-ray today, you will receive an invoice from [facility]. Please contact [facility] at [phone] with questions or concerns regarding your invoice.   IF you received labwork today, you will receive an invoice  from [place]. Please contact [facility] at [phone] with questions or concerns regarding your invoice.   Our billing staff will not be able to assist you with questions regarding bills from these companies.  You will be contacted with the lab results as soon as they are available. The fastest way to get your results is to activate your My Chart account. Instructions are located on the last page of this paperwork. If you have not heard from [place] regarding the results in 2 weeks, please contact this office.     Cervical Sprain  A cervical sprain is a stretch or tear in the tissues that connect bones (ligaments) in the neck. Most neck (cervical) sprains get better in 4-6 weeks. Follow these instructions at home: If you have a neck collar:  Wear it as told by your doctor. Do not take off (do not remove) the collar unless your doctor says that this is safe.  Ask your doctor before adjusting your collar.  If you have long hair, keep it outside of the collar.  Ask your doctor if you may take off the collar for cleaning and bathing. If you may take off the collar: ? Follow instructions from your doctor about how to take off the collar safely. ? Clean the collar by wiping it with mild soap and water. Let it air-dry all the way. ? If your collar has removable pads:  Take the pads out every 1-2 days.  Hand wash the pads with soap and water.  Let the pads air-dry all the way before you put them back in the collar. Do not dry them in a clothes dryer. Do not dry them with a hair dryer. ? Check your skin under the collar for irritation or sores. If you see any, tell your doctor. Managing pain, stiffness, and swelling   Use a cervical traction device, if told by your doctor.  If told, put heat on the affected area. Do this before exercises (physical therapy) or as often as told by your doctor. Use the heat source that your doctor recommends, such as a moist heat pack or a heating pad. ? Place a  towel between your skin and the heat source. ? Leave the heat on for 20-30 minutes. ? Take the heat off (remove the heat) if your skin turns bright red. This is very important if you cannot feel pain, heat, or cold. You may have a greater risk of getting burned.  Put ice on the affected area. ? Put ice in a plastic bag. ? Place a towel between your skin and the bag. ? Leave the ice on for 20 minutes, 2-3 times a day. Activity  Do not drive while wearing a neck collar. If you do not have a neck collar, ask your doctor if it is safe to drive.  Do not drive or use heavy machinery while taking prescription pain medicine or muscle relaxants, unless your doctor approves.  Do not lift anything that is heavier than 10 lb (4.5 kg) until your doctor tells you that it is safe.  Rest as told by your doctor.  Avoid activities that make you feel worse. Ask your doctor what activities are safe for you.  Do exercises as told by your doctor or physical therapist.  Preventing neck sprain  Practice good posture. Adjust your workstation to help with this, if needed.  Exercise regularly as told by your doctor or physical therapist.  Avoid activities that are risky or may cause a neck sprain (cervical sprain). General instructions  Take over-the-counter and prescription medicines only as told by your doctor.  Do not use any products that contain nicotine or tobacco. This includes cigarettes and e-cigarettes. If you need help quitting, ask your doctor.  Keep all follow-up visits as told by your doctor. This is important. Contact a doctor if:  You have pain or other symptoms that get worse.  You have symptoms that do not get better after 2 weeks.  You have pain that does not get better with medicine.  You start to have new, unexplained symptoms.  You have sores or irritated skin from wearing your neck collar. Get help right away if:  You have very bad pain.  You have any of the following in  any part of your body: ? Loss of feeling (numbness). ? Tingling. ? Weakness.  You cannot move a part of your body (you have paralysis).  Your activity level does not improve. Summary  A cervical sprain is a stretch or tear in the tissues that connect bones (ligaments) in the neck.  If you have a neck (cervical) collar, do not take off the collar unless your doctor says that this is safe.  Put ice on affected areas as told by your doctor.  Put heat on affected areas as told by your doctor.  Good posture and regular exercise can help prevent a neck sprain from happening again. This information is not intended to replace advice given to you by your health care provider. Make sure you discuss any questions you have with your health care provider. Document Revised: 08/01/2018 Document Reviewed: 12/22/2015 Elsevier Patient Education  2020 Elsevier Inc.      Edwina Barth, MD [facility]

## 2019-08-29 NOTE — Patient Instructions (Addendum)
   If you have lab work done today you will be contacted with your lab results within the next 2 weeks.  If you have not heard from us then please contact us. The fastest way to get your results is to register for My Chart.   IF you received an x-ray today, you will receive an invoice from [facility]. Please contact [facility] at [phone] with questions or concerns regarding your invoice.   IF you received labwork today, you will receive an invoice from [facility]. Please contact [facility] at [phone] with questions or concerns regarding your invoice.   Our billing staff will not be able to assist you with questions regarding bills from these companies.  You will be contacted with the lab results as soon as they are available. The fastest way to get your results is to activate your My Chart account. Instructions are located on the last page of this paperwork. If you have not heard from us regarding the results in 2 weeks, please contact this office.      Cervical Sprain  A cervical sprain is a stretch or tear in the tissues that connect bones (ligaments) in the neck. Most neck (cervical) sprains get better in 4-6 weeks. Follow these instructions at home: If you have a neck collar:  Wear it as told by your doctor. Do not take off (do not remove) the collar unless your doctor says that this is safe.  Ask your doctor before adjusting your collar.  If you have long hair, keep it outside of the collar.  Ask your doctor if you may take off the collar for cleaning and bathing. If you may take off the collar: ? Follow instructions from your doctor about how to take off the collar safely. ? Clean the collar by wiping it with mild soap and water. Let it air-dry all the way. ? If your collar has removable pads:  Take the pads out every 1-2 days.  Hand wash the pads with soap and water.  Let the pads air-dry all the way before you put them back in the collar. Do  not dry them in a clothes dryer. Do not dry them with a hair dryer. ? Check your skin under the collar for irritation or sores. If you see any, tell your doctor. Managing pain, stiffness, and swelling   Use a cervical traction device, if told by your doctor.  If told, put heat on the affected area. Do this before exercises (physical therapy) or as often as told by your doctor. Use the heat source that your doctor recommends, such as a moist heat pack or a heating pad. ? Place a towel between your skin and the heat source. ? Leave the heat on for 20-30 minutes. ? Take the heat off (remove the heat) if your skin turns bright red. This is very important if you cannot feel pain, heat, or cold. You may have a greater risk of getting burned.  Put ice on the affected area. ? Put ice in a plastic bag. ? Place a towel between your skin and the bag. ? Leave the ice on for 20 minutes, 2-3 times a day. Activity  Do not drive while wearing a neck collar. If you do not have a neck collar, ask your doctor if it is safe to drive.  Do not drive or use heavy machinery while taking prescription pain medicine or muscle relaxants, unless your doctor approves.  Do not lift anything that is   heavier than 10 lb (4.5 kg) until your doctor tells you that it is safe.  Rest as told by your doctor.  Avoid activities that make you feel worse. Ask your doctor what activities are safe for you.  Do exercises as told by your doctor or physical therapist. Preventing neck sprain  Practice good posture. Adjust your workstation to help with this, if needed.  Exercise regularly as told by your doctor or physical therapist.  Avoid activities that are risky or may cause a neck sprain (cervical sprain). General instructions  Take over-the-counter and prescription medicines only as told by your doctor.  Do not use any products that contain nicotine or tobacco. This includes cigarettes and e-cigarettes. If you need help  quitting, ask your doctor.  Keep all follow-up visits as told by your doctor. This is important. Contact a doctor if:  You have pain or other symptoms that get worse.  You have symptoms that do not get better after 2 weeks.  You have pain that does not get better with medicine.  You start to have new, unexplained symptoms.  You have sores or irritated skin from wearing your neck collar. Get help right away if:  You have very bad pain.  You have any of the following in any part of your body: ? Loss of feeling (numbness). ? Tingling. ? Weakness.  You cannot move a part of your body (you have paralysis).  Your activity level does not improve. Summary  A cervical sprain is a stretch or tear in the tissues that connect bones (ligaments) in the neck.  If you have a neck (cervical) collar, do not take off the collar unless your doctor says that this is safe.  Put ice on affected areas as told by your doctor.  Put heat on affected areas as told by your doctor.  Good posture and regular exercise can help prevent a neck sprain from happening again. This information is not intended to replace advice given to you by your health care provider. Make sure you discuss any questions you have with your health care provider. Document Revised: 08/01/2018 Document Reviewed: 12/22/2015 Elsevier Patient Education  2020 Elsevier Inc.  

## 2019-09-04 DIAGNOSIS — Z1329 Encounter for screening for other suspected endocrine disorder: Secondary | ICD-10-CM | POA: Diagnosis not present

## 2019-09-04 DIAGNOSIS — Z0000 Encounter for general adult medical examination without abnormal findings: Secondary | ICD-10-CM | POA: Diagnosis not present

## 2019-09-04 DIAGNOSIS — Z13220 Encounter for screening for lipoid disorders: Secondary | ICD-10-CM | POA: Diagnosis not present

## 2019-09-04 DIAGNOSIS — Z131 Encounter for screening for diabetes mellitus: Secondary | ICD-10-CM | POA: Diagnosis not present

## 2019-09-04 DIAGNOSIS — Z130 Encounter for screening for diseases of the blood and blood-forming organs and certain disorders involving the immune mechanism: Secondary | ICD-10-CM | POA: Diagnosis not present

## 2019-09-17 ENCOUNTER — Other Ambulatory Visit (HOSPITAL_COMMUNITY): Payer: Self-pay | Admitting: Psychiatry

## 2019-09-17 DIAGNOSIS — F330 Major depressive disorder, recurrent, mild: Secondary | ICD-10-CM

## 2019-09-18 ENCOUNTER — Ambulatory Visit (INDEPENDENT_AMBULATORY_CARE_PROVIDER_SITE_OTHER): Payer: BC Managed Care – PPO | Admitting: Psychiatry

## 2019-09-18 ENCOUNTER — Other Ambulatory Visit: Payer: Self-pay

## 2019-09-18 DIAGNOSIS — F900 Attention-deficit hyperactivity disorder, predominantly inattentive type: Secondary | ICD-10-CM | POA: Diagnosis not present

## 2019-09-18 DIAGNOSIS — F419 Anxiety disorder, unspecified: Secondary | ICD-10-CM | POA: Diagnosis not present

## 2019-09-18 DIAGNOSIS — F330 Major depressive disorder, recurrent, mild: Secondary | ICD-10-CM | POA: Diagnosis not present

## 2019-09-18 MED ORDER — VYVANSE 40 MG PO CHEW: 40.0000 mg | CHEWABLE_TABLET | ORAL | 0 refills | Status: DC

## 2019-09-18 MED ORDER — TRAZODONE HCL 150 MG PO TABS: ORAL_TABLET | ORAL | 0 refills | Status: DC

## 2019-09-18 MED ORDER — SERTRALINE HCL 100 MG PO TABS: ORAL_TABLET | ORAL | 0 refills | Status: DC

## 2019-09-18 NOTE — Progress Notes (Signed)
Virtual Visit via Telephone Note  I connected with Ann Santos on 09/18/19 at  3:20 PM EDT by telephone and verified that I am speaking with the correct person using two identifiers.   I discussed the limitations, risks, security and privacy concerns of performing an evaluation and management service by telephone and the availability of in person appointments. I also discussed with the patient that there may be a patient responsible charge related to this service. The patient expressed understanding and agreed to proceed.  Patient location; home Provider location; home office  History of Present Illness: Patient is evaluated by phone session. She is doing well since taking and getting her medication on time. She feels her sleep is improved since trazodone increased and there are days when she does not take full tablet of trazodone 150. However she like to have the option for 150 so she can take it as needed. She was recently seen by PCP because of muscle pain and she was given Flexeril but she has not taking and doing better. She feels the current medicine is working that she is able to multitasking. Her attention, concentration is good. She is pleased that her husband is doing well and going for therapy. She also seeing Dr. Shon Santos for counseling. She has no tremor shakes or any EPS. Her appetite is okay. 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 Patient does not want to change medication.  Past Psychiatric History:Reviewed. Started antidepressantin 2009when husbandgoing through drug addiction.Tried Celexa, Wellbutrin, Prozac and Strattera. Had a good response with Zoloft and trazodone.No H/Oinpatient treatment,mania, psychosis, hallucination or any suicidal attempt.   Psychiatric Specialty Exam: Physical Exam  Review of Systems  There were no vitals taken for this  visit.There is no height or weight on file to calculate BMI.  General Appearance: NA  Eye Contact:  NA  Speech:  Clear and Coherent  Volume:  Normal  Mood:  Euthymic  Affect:  NA  Thought Process:  Goal Directed  Orientation:  Full (Time, Place, and Person)  Thought Content:  Logical  Suicidal Thoughts:  No  Homicidal Thoughts:  No  Memory:  Immediate;   Good Recent;   Good Remote;   Good  Judgement:  Good  Insight:  Present  Psychomotor Activity:  NA  Concentration:  Concentration: Good and Attention Span: Good  Recall:  Good  Fund of Knowledge:  Good  Language:  Good  Akathisia:  No  Handed:  Right  AIMS (if indicated):     Assets:  Communication Skills Desire for Improvement Housing Resilience Social Support Talents/Skills  ADL's:  Intact  Cognition:  WNL  Sleep:   improved      Assessment and Plan: Attention deficit disorder, inattentive type. Major depressive disorder, recurrent. Anxiety.  Patient is a stable on her current medication. Since getting the medication on time she feels it is working well. She has no tremors shakes or any EPS. I will continue Vyvanse 40 mg chewable to take every day, continue trazodone 150 mg at bedtime and Zoloft 100 mg daily. Discussed medication side effects and benefits. Encouraged to continue therapy with Harrie Foreman. Recommended to call us back if she is any question or any concerns. Follow-up in 3 months.  Follow Up Instructions:    I discussed the assessment and treatment plan with the patient. The patient was provided an  opportunity to ask questions and all were answered. The patient agreed with the plan and demonstrated an understanding of the instructions.   The patient was advised to call back or seek an in-person evaluation if the symptoms worsen or if the condition fails to improve as anticipated.  I provided 20 minutes of non-face-to-face time during this encounter.   Kathlee Nations, MD

## 2019-10-21 ENCOUNTER — Telehealth (HOSPITAL_COMMUNITY): Payer: Self-pay

## 2019-10-21 DIAGNOSIS — F900 Attention-deficit hyperactivity disorder, predominantly inattentive type: Secondary | ICD-10-CM

## 2019-10-21 NOTE — Telephone Encounter (Signed)
Patient called requesting a refill on her Vyvanse 40mg . She uses [facility] [address] in [place] and has followup scheduled for 12/19/19. Thank you.

## 2019-10-22 MED ORDER — VYVANSE 40 MG PO CHEW: 40.0000 mg | CHEWABLE_TABLET | ORAL | 0 refills | Status: DC

## 2019-10-22 NOTE — Telephone Encounter (Signed)
Done

## 2019-10-23 DIAGNOSIS — I82812 Embolism and thrombosis of superficial veins of left lower extremities: Secondary | ICD-10-CM | POA: Diagnosis not present

## 2019-11-19 ENCOUNTER — Telehealth (HOSPITAL_COMMUNITY): Payer: Self-pay | Admitting: *Deleted

## 2019-11-19 DIAGNOSIS — F900 Attention-deficit hyperactivity disorder, predominantly inattentive type: Secondary | ICD-10-CM

## 2019-11-19 MED ORDER — VYVANSE 40 MG PO CHEW: 40.0000 mg | CHEWABLE_TABLET | ORAL | 0 refills | Status: DC

## 2019-11-19 NOTE — Telephone Encounter (Signed)
done

## 2019-11-19 NOTE — Telephone Encounter (Signed)
Pt called requesting a refill of the VyVanse 40 mg chew. Last ordered 10/22/19. Pt has an upcoming appointment on 8/26. Please review.

## 2019-12-02 DIAGNOSIS — Z20822 Contact with and (suspected) exposure to covid-19: Secondary | ICD-10-CM | POA: Diagnosis not present

## 2019-12-16 ENCOUNTER — Other Ambulatory Visit (HOSPITAL_COMMUNITY): Payer: Self-pay | Admitting: Psychiatry

## 2019-12-16 DIAGNOSIS — F330 Major depressive disorder, recurrent, mild: Secondary | ICD-10-CM

## 2019-12-19 ENCOUNTER — Telehealth (INDEPENDENT_AMBULATORY_CARE_PROVIDER_SITE_OTHER): Payer: BC Managed Care – PPO | Admitting: Psychiatry

## 2019-12-19 ENCOUNTER — Other Ambulatory Visit: Payer: Self-pay

## 2019-12-19 ENCOUNTER — Encounter (HOSPITAL_COMMUNITY): Payer: Self-pay | Admitting: Psychiatry

## 2019-12-19 DIAGNOSIS — F419 Anxiety disorder, unspecified: Secondary | ICD-10-CM

## 2019-12-19 DIAGNOSIS — F330 Major depressive disorder, recurrent, mild: Secondary | ICD-10-CM

## 2019-12-19 DIAGNOSIS — F900 Attention-deficit hyperactivity disorder, predominantly inattentive type: Secondary | ICD-10-CM

## 2019-12-19 MED ORDER — VYVANSE 40 MG PO CHEW: 40.0000 mg | CHEWABLE_TABLET | ORAL | 0 refills | Status: DC

## 2019-12-19 MED ORDER — TRAZODONE HCL 150 MG PO TABS: ORAL_TABLET | ORAL | 0 refills | Status: DC

## 2019-12-19 MED ORDER — SERTRALINE HCL 100 MG PO TABS: ORAL_TABLET | ORAL | 0 refills | Status: DC

## 2019-12-19 NOTE — Progress Notes (Signed)
Virtual Visit via Telephone Note  I connected with Ann Santos on 12/19/19 at  9:00 AM EDT by telephone and verified that I am speaking with the correct person using two identifiers.  Location: Patient: work Provider: home office   I discussed the limitations, risks, security and privacy concerns of performing an evaluation and management service by telephone and the availability of in person appointments. I also discussed with the patient that there may be a patient responsible charge related to this service. The patient expressed understanding and agreed to proceed.   History of Present Illness: Patient is evaluated by phone session.  She is taking her medication as prescribed.  She worked in the summer and took classes in the summer and now school started so she is working full-time.  She admitted first week was somewhat stressful but now she is getting back to her normal routine.  She is very happy that her husband is doing well and they continue to do marriage counseling and also she is seeing [facility] for her own counseling.  She is sleeping good.  She denies any tremors shakes or any EPS.  Her attention and focus is good.  She is able to do multitasking.  She takes trazodone 150 mg half to 1 tablet which works very well.  Her energy level is okay.  She admitted in the summer had a few beer drinks but not anymore.  She does not want to change medication.  She denies any mania, psychosis.  She denies any crying spells or any feeling of hopelessness.  She denies any panic attack.  Past Psychiatric History: Started antidepressantin 2009when husbandgoing through drug addiction.Tried Celexa, Wellbutrin, Prozac and Strattera. Had a good response with Zoloft and trazodone.No H/Oinpatient treatment,mania, psychosis, hallucination or any suicidal attempt.    Psychiatric Specialty Exam: Physical Exam  Review of Systems  Weight 200 lb (90.7 kg).There is no height or weight on file to  calculate BMI.  General Appearance: NA  Eye Contact:  NA  Speech:  Clear and Coherent  Volume:  Normal  Mood:  Euthymic  Affect:  NA  Thought Process:  Goal Directed  Orientation:  Full (Time, Place, and Person)  Thought Content:  WDL  Suicidal Thoughts:  No  Homicidal Thoughts:  No  Memory:  Immediate;   Good Recent;   Good Remote;   Good  Judgement:  Intact  Insight:  Present  Psychomotor Activity:  NA  Concentration:  Concentration: Good and Attention Span: Good  Recall:  Good  Fund of Knowledge:  Good  Language:  Good  Akathisia:  No  Handed:  Right  AIMS (if indicated):     Assets:  Communication Skills Desire for Improvement Housing Resilience Social Support Talents/Skills Transportation  ADL's:  Intact  Cognition:  WNL  Sleep:   ok      Assessment and Plan: Attention deficit disorder, inattentive type.  Major depressive disorder, recurrent.  Anxiety.  Patient is doing well on her current medication.  She has no tremors shakes or any other concern.  Continue Vyvanse 40 mg chewable take every day, trazodone 150 mg at bedtime and Zoloft 100 mg daily.  Discussed medication side effects specially stimulant not to be taken with alcohol.  She understand and agree.  Encouraged to continue therapy with Harrie Foreman.  Recommended to call us back if she is any question or any concern.  Follow-up in 3 months.  Follow Up Instructions:    I discussed the assessment and treatment  plan with the patient. The patient was provided an opportunity to ask questions and all were answered. The patient agreed with the plan and demonstrated an understanding of the instructions.   The patient was advised to call back or seek an in-person evaluation if the symptoms worsen or if the condition fails to improve as anticipated.  I provided 20 minutes of non-face-to-face time during this encounter.   Cleotis Nipper, MD

## 2020-01-03 ENCOUNTER — Ambulatory Visit: Payer: BC Managed Care – PPO | Admitting: Family Medicine

## 2020-01-03 ENCOUNTER — Encounter: Payer: Self-pay | Admitting: Family Medicine

## 2020-01-03 ENCOUNTER — Other Ambulatory Visit: Payer: Self-pay

## 2020-01-03 VITALS — BP 132/80 | HR 82 | Temp 98.6°F | Ht 61.0 in | Wt 218.8 lb

## 2020-01-03 DIAGNOSIS — K5732 Diverticulitis of large intestine without perforation or abscess without bleeding: Secondary | ICD-10-CM | POA: Diagnosis not present

## 2020-01-03 DIAGNOSIS — R1032 Left lower quadrant pain: Secondary | ICD-10-CM

## 2020-01-03 DIAGNOSIS — Z23 Encounter for immunization: Secondary | ICD-10-CM

## 2020-01-03 DIAGNOSIS — R110 Nausea: Secondary | ICD-10-CM

## 2020-01-03 LAB — POCT CBC
Granulocyte percent: 78.9 %G (ref 37–80)
HCT, POC: 40.1 % (ref 29–41)
Hemoglobin: 13.5 g/dL (ref 11–14.6)
Lymph, poc: 2.1 (ref 0.6–3.4)
MCH, POC: 31.6 pg — AB (ref 27–31.2)
MCHC: 33.5 g/dL (ref 31.8–35.4)
MCV: 94.2 fL (ref 76–111)
MID (cbc): 0.4 (ref 0–0.9)
MPV: 6.2 fL (ref 0–99.8)
POC Granulocyte: 9.2 — AB (ref 2–6.9)
POC LYMPH PERCENT: 17.8 %L (ref 10–50)
POC MID %: 3.3 %M (ref 0–12)
Platelet Count, POC: 343 10*3/uL (ref 142–424)
RBC: 4.26 M/uL (ref 4.04–5.48)
RDW, POC: 12.2 %
WBC: 11.7 10*3/uL — AB (ref 4.6–10.2)

## 2020-01-03 MED ORDER — CIPROFLOXACIN HCL 500 MG PO TABS: 500.0000 mg | ORAL_TABLET | Freq: Two times a day (BID) | ORAL | 0 refills | Status: AC

## 2020-01-03 MED ORDER — HYDROCODONE-ACETAMINOPHEN 5-325 MG PO TABS: 1.0000 | ORAL_TABLET | Freq: Four times a day (QID) | ORAL | 0 refills | Status: DC | PRN

## 2020-01-03 MED ORDER — FLUCONAZOLE 150 MG PO TABS: 150.0000 mg | ORAL_TABLET | Freq: Once | ORAL | 0 refills | Status: AC

## 2020-01-03 MED ORDER — METRONIDAZOLE 500 MG PO TABS: 500.0000 mg | ORAL_TABLET | Freq: Three times a day (TID) | ORAL | 0 refills | Status: DC

## 2020-01-03 MED ORDER — ONDANSETRON 4 MG PO TBDP: 4.0000 mg | ORAL_TABLET | Freq: Three times a day (TID) | ORAL | 0 refills | Status: DC | PRN

## 2020-01-03 NOTE — Patient Instructions (Addendum)
  Take ciprofloxacin 500 mg 1 twice daily  Take metronidazole 500 mg 1 3 times daily.  Do not drink any alcohol while on this medicine.  Take hydrocodone/APAP 5/325 1 every 4-6 hours if needed for severe pain.  Ondansetron (Zofran) 4 mg 1 every 6 or 8 hours as needed for nausea or vomiting  You can take Tylenol (acetaminophen) for pain also.  However the hydrocodone pills have 325 mg of acetaminophen combined with the hydrocodone.  You should not exceed 3000 mg of acetaminophen in 24 hours, so keep a total that includes both the hydrocodone pills and the Tylenol pills you have taken.  Higher doses can cause liver toxicity.  In the event of getting acute worsening of abdominal pain, nausea or vomiting and inability to keep things down, passing of blood, high fevers, or just generally worse go to the emergency room.     If you have lab work done today you will be contacted with your lab results within the next 2 weeks.  If you have not heard from [place] then please contact us. The fastest way to get your results is to register for My Chart.   IF you received an x-ray today, you will receive an invoice from [facility]. Please contact [facility] at [phone] with questions or concerns regarding your invoice.   IF you received labwork today, you will receive an invoice from [place]. Please contact [facility] at [phone] with questions or concerns regarding your invoice.   Our billing staff will not be able to assist you with questions regarding bills from these companies.  You will be contacted with the lab results as soon as they are available. The fastest way to get your results is to activate your My Chart account. Instructions are located on the last page of this paperwork. If you have not heard from [place] regarding the results in 2 weeks, please contact this office.

## 2020-01-03 NOTE — Progress Notes (Signed)
Patient ID: Ann Santos, female    DOB: 1980-08-04  Age: 39 y.o. MRN: [id number]  Chief Complaint  Patient presents with  . Diverticulitis    flair up that started 3 days ago. left side pain    Subjective:   Patient has been having left lower quadrant abdominal pain for the past 3 days.  It is getting steadily worse.  Not having fever or chills.  Her bowels were a little slow and she took some mild laxative with good results.  She is not passing any blood.  A little nausea but no vomiting.  She has a history of diverticulosis and diverticulitis.  Diverticulosis was documented on a colonoscopy 8 years ago.  Has been treated for diverticulitis several times with outpatient antibiotic therapy.  She has had extensive surgeries on her abdomen and a lot of scarring externally from that.  This was the result of obesity and tummy tucks etc.  Current allergies, medications, problem list, past/family and social histories reviewed.  Objective:  BP 132/80   Pulse 82   Temp 98.6 F (37 C) (Temporal)   Ht 5\' 1"  (1.549 m)   Wt 218 lb 12.8 oz (99.2 kg)   LMP 12/19/2019   SpO2 97%   BMI 41.34 kg/m   Pleasant lady, obese.  Chest clear.  Abdomen has bowel sounds present.  Soft without organomegaly or masses.  Multiple surgical scars noted.  Very tender in the left lower quadrant with the teres tapering off to the left upper and right lower areas.  No rebound.  Assessment & Plan:   Assessment: 1. LLQ abdominal pain   2. Diverticulitis of colon without hemorrhage   3. Nausea       Plan: Everything is consistent with this being a recurrent diverticulitis.  Mild elevation of her leukocyte count.  See instructions.  Orders Placed This Encounter  Procedures  . Flu Vaccine QUAD 36+ mos IM  . Comprehensive metabolic panel  . POCT CBC    Meds ordered this encounter  Medications  . ciprofloxacin (CIPRO) 500 MG tablet    Sig: Take 1 tablet (500 mg total) by mouth 2 (two) times daily for 7  days.    Dispense:  14 tablet    Refill:  0  . metroNIDAZOLE (FLAGYL) 500 MG tablet    Sig: Take 1 tablet (500 mg total) by mouth 3 (three) times daily. DO NOT CONSUME ALCOHOL WHILE TAKING THIS MEDICATION.    Dispense:  21 tablet    Refill:  0  . HYDROcodone-acetaminophen (NORCO) 5-325 MG tablet    Sig: Take 1 tablet by mouth every 6 (six) hours as needed.    Dispense:  20 tablet    Refill:  0  . ondansetron (ZOFRAN ODT) 4 MG disintegrating tablet    Sig: Take 1 tablet (4 mg total) by mouth every 8 (eight) hours as needed for nausea or vomiting.    Dispense:  20 tablet    Refill:  0  . fluconazole (DIFLUCAN) 150 MG tablet    Sig: Take 1 tablet (150 mg total) by mouth once for 1 dose. Repeat if needed    Dispense:  2 tablet    Refill:  0         Patient Instructions    Take ciprofloxacin 500 mg 1 twice daily  Take metronidazole 500 mg 1 3 times daily.  Do not drink any alcohol while on this medicine.  Take hydrocodone/APAP 5/325 1 every 4-6 hours if needed  for severe pain.  Ondansetron (Zofran) 4 mg 1 every 6 or 8 hours as needed for nausea or vomiting  You can take Tylenol (acetaminophen) for pain also.  However the hydrocodone pills have 325 mg of acetaminophen combined with the hydrocodone.  You should not exceed 3000 mg of acetaminophen in 24 hours, so keep a total that includes both the hydrocodone pills and the Tylenol pills you have taken.  Higher doses can cause liver toxicity.  In the event of getting acute worsening of abdominal pain, nausea or vomiting and inability to keep things down, passing of blood, high fevers, or just generally worse go to the emergency room.     If you have lab work done today you will be contacted with your lab results within the next 2 weeks.  If you have not heard from [place] then please contact us. The fastest way to get your results is to register for My Chart.   IF you received an x-ray today, you will receive an invoice from  [facility]. Please contact [facility] Radiology at [phone] with questions or concerns regarding your invoice.   IF you received labwork today, you will receive an invoice from [place]. Please contact [facility] at [phone] with questions or concerns regarding your invoice.   Our billing staff will not be able to assist you with questions regarding bills from these companies.  You will be contacted with the lab results as soon as they are available. The fastest way to get your results is to activate your My Chart account. Instructions are located on the last page of this paperwork. If you have not heard from [place] regarding the results in 2 weeks, please contact this office.         No follow-ups on file.   Janace Hoard, MD 01/03/2020

## 2020-01-04 LAB — COMPREHENSIVE METABOLIC PANEL
ALT: 20 IU/L (ref 0–32)
AST: 22 IU/L (ref 0–40)
Albumin/Globulin Ratio: 1.5 (ref 1.2–2.2)
Albumin: 4.3 g/dL (ref 3.8–4.8)
Alkaline Phosphatase: 56 IU/L (ref 48–121)
BUN/Creatinine Ratio: 21 (ref 9–23)
BUN: 12 mg/dL (ref 6–20)
Bilirubin Total: 0.6 mg/dL (ref 0.0–1.2)
CO2: 22 mmol/L (ref 20–29)
Calcium: 9.5 mg/dL (ref 8.7–10.2)
Chloride: 100 mmol/L (ref 96–106)
Creatinine, Ser: 0.57 mg/dL (ref 0.57–1.00)
GFR calc Af Amer: 135 mL/min/{1.73_m2} (ref >59)
GFR calc non Af Amer: 117 mL/min/{1.73_m2} (ref >59)
Globulin, Total: 2.8 g/dL (ref 1.5–4.5)
Glucose: 86 mg/dL (ref 65–99)
Potassium: 3.9 mmol/L (ref 3.5–5.2)
Sodium: 136 mmol/L (ref 134–144)
Total Protein: 7.1 g/dL (ref 6.0–8.5)

## 2020-01-22 ENCOUNTER — Telehealth (HOSPITAL_COMMUNITY): Payer: Self-pay | Admitting: *Deleted

## 2020-01-22 DIAGNOSIS — F900 Attention-deficit hyperactivity disorder, predominantly inattentive type: Secondary | ICD-10-CM

## 2020-01-22 MED ORDER — VYVANSE 40 MG PO CHEW: 40.0000 mg | CHEWABLE_TABLET | ORAL | 0 refills | Status: DC

## 2020-01-22 NOTE — Telephone Encounter (Signed)
Patient called and requested refill of Vyvance.  Her next appt is 11/16.

## 2020-01-22 NOTE — Telephone Encounter (Signed)
Done

## 2020-02-21 ENCOUNTER — Telehealth (HOSPITAL_COMMUNITY): Payer: Self-pay | Admitting: *Deleted

## 2020-02-21 ENCOUNTER — Other Ambulatory Visit (HOSPITAL_COMMUNITY): Payer: Self-pay | Admitting: Psychiatry

## 2020-02-21 DIAGNOSIS — F900 Attention-deficit hyperactivity disorder, predominantly inattentive type: Secondary | ICD-10-CM

## 2020-02-21 MED ORDER — VYVANSE 40 MG PO CHEW: 40.0000 mg | CHEWABLE_TABLET | ORAL | 0 refills | Status: DC

## 2020-02-21 NOTE — Telephone Encounter (Signed)
Dr. Sheela Stack pt needs a refill on the VyVanse 40mg . Pt has an appointment on 03/10/20. [facility] [employer], [address]

## 2020-02-21 NOTE — Telephone Encounter (Signed)
Done

## 2020-03-10 ENCOUNTER — Telehealth (INDEPENDENT_AMBULATORY_CARE_PROVIDER_SITE_OTHER): Payer: BC Managed Care – PPO | Admitting: Psychiatry

## 2020-03-10 ENCOUNTER — Other Ambulatory Visit: Payer: Self-pay

## 2020-03-10 DIAGNOSIS — F330 Major depressive disorder, recurrent, mild: Secondary | ICD-10-CM

## 2020-03-10 DIAGNOSIS — F900 Attention-deficit hyperactivity disorder, predominantly inattentive type: Secondary | ICD-10-CM | POA: Diagnosis not present

## 2020-03-10 DIAGNOSIS — F419 Anxiety disorder, unspecified: Secondary | ICD-10-CM

## 2020-03-10 MED ORDER — VYVANSE 40 MG PO CHEW: 40.0000 mg | CHEWABLE_TABLET | ORAL | 0 refills | Status: DC

## 2020-03-10 MED ORDER — SERTRALINE HCL 100 MG PO TABS: ORAL_TABLET | ORAL | 0 refills | Status: DC

## 2020-03-10 MED ORDER — TRAZODONE HCL 150 MG PO TABS: ORAL_TABLET | ORAL | 0 refills | Status: DC

## 2020-03-10 NOTE — Progress Notes (Signed)
Virtual Visit via Telephone Note  I connected with Ann Santos on 03/10/20 at  4:00 PM EST by telephone and verified that I am speaking with the correct person using two identifiers.  Location: Patient: Work Provider: Biomedical scientist   I discussed the limitations, risks, security and privacy concerns of performing an evaluation and management service by telephone and the availability of in person appointments. I also discussed with the patient that there may be a patient responsible charge related to this service. The patient expressed understanding and agreed to proceed.   History of Present Illness: Patient is evaluated by phone session.  She is taking her medication as prescribed.  She feels it is working and helping her attention, focus, multitasking.  She also feels her anxiety and depression is under control.  Her weight is stable.  She is in marriage counseling with Dr. Towanda Malkin and also getting visual therapy from her.  She feels things are going well.  She denies any panic attack.  She is pleased that she will stay home from Thanksgiving break to get some rest.  Her sleep is good with the trazodone.  Recently she had a blood work and her labs are normal.  She has no tremors, shakes or any EPS.  Her appetite is okay.  Her energy level is good.  She denies any feeling of hopelessness or worthlessness.  She denies any illegal substance use.  Past Psychiatric History: Started antidepressantin 2009when husbandgoing through drug addiction.Tried Celexa, Wellbutrin, Prozac and Strattera. Had a good response with Zoloft and trazodone.No H/Oinpatient treatment,mania, psychosis, hallucination or any suicidal attempt.  Recent Results (from the past 2160 hour(s))  POCT CBC     Status: Abnormal   Collection Time: 01/03/20 11:50 AM  Result Value Ref Range   WBC 11.7 (A) 4.6 - 10.2 K/uL   Lymph, poc 2.1 0.6 - 3.4   POC LYMPH PERCENT 17.8 10 - 50 %L   MID (cbc) 0.4 0 - 0.9   POC MID % 3.3 0  - 12 %M   POC Granulocyte 9.2 (A) 2 - 6.9   Granulocyte percent 78.9 37 - 80 %G   RBC 4.26 4.04 - 5.48 M/uL   Hemoglobin 13.5 11 - 14.6 g/dL   HCT, POC 40.1 29 - 41 %   MCV 94.2 76 - 111 fL   MCH, POC 31.6 (A) 27 - 31.2 pg   MCHC 33.5 31.8 - 35.4 g/dL   RDW, POC 12.2 %   Platelet Count, POC 343 142 - 424 K/uL   MPV 6.2 0 - 99.8 fL  Comprehensive metabolic panel     Status: None   Collection Time: 01/03/20 12:10 PM  Result Value Ref Range   Glucose 86 65 - 99 mg/dL   BUN 12 6 - 20 mg/dL   Creatinine, Ser 0.57 0.57 - 1.00 mg/dL   GFR calc non Af Amer 117 >59 mL/min/1.73   GFR calc Af Amer 135 >59 mL/min/1.73    Comment: **[facility] currently reports eGFR in compliance with the current**   recommendations of the [employer]. [facility] will   update reporting as new guidelines are published from the NKF-ASN   Task force.    BUN/Creatinine Ratio 21 9 - 23   Sodium 136 134 - 144 mmol/L   Potassium 3.9 3.5 - 5.2 mmol/L   Chloride 100 96 - 106 mmol/L   CO2 22 20 - 29 mmol/L   Calcium 9.5 8.7 - 10.2 mg/dL   Total  Protein 7.1 6.0 - 8.5 g/dL   Albumin 4.3 3.8 - 4.8 g/dL   Globulin, Total 2.8 1.5 - 4.5 g/dL   Albumin/Globulin Ratio 1.5 1.2 - 2.2   Bilirubin Total 0.6 0.0 - 1.2 mg/dL   Alkaline Phosphatase 56 48 - 121 IU/L    Comment: **Effective January 06, 2020 Alkaline Phosphatase**   reference interval will be changing to:              Age                Female          Female           0 -  5 days         71 - 127       44 - 127           6 - 10 days         77 - 242       72 - 63          11 - 20 days        109 - 357      109 - 357          21 - 30 days         94 - 161       09 - 494           1 -  2 months      149 - 539      149 - 539           3 -  6 months      131 - 452      131 - 452           7 - 11 months      117 - 401      117 - 401   12 months -  6 years       158 - 369      158 - 369           7 - 12 years       150 - 409      150 - 409                13 years       12 - 435       80 - 227               14 years       45 - 375       7 - 161               15 years        36 - 279       21 - 134               70 years        47 - 207       11 - 121               71 years        1 - 161       55 - 113          80 - 20 years        12 - 125       76 - 106              >  20 years         44 - 121       44 - 121    AST 22 0 - 40 IU/L   ALT 20 0 - 32 IU/L      Psychiatric Specialty Exam: Physical Exam  Review of Systems  There were no vitals taken for this visit.There is no height or weight on file to calculate BMI.  General Appearance: NA  Eye Contact:  NA  Speech:  Clear and Coherent  Volume:  Normal  Mood:  Euthymic  Affect:  NA  Thought Process:  Goal Directed  Orientation:  Full (Time, Place, and Person)  Thought Content:  WDL  Suicidal Thoughts:  No  Homicidal Thoughts:  No  Memory:  Immediate;   Good Recent;   Good Remote;   Good  Judgement:  Intact  Insight:  Present  Psychomotor Activity:  NA  Concentration:  Concentration: Good and Attention Span: Good  Recall:  Good  Fund of Knowledge:  Good  Language:  Good  Akathisia:  No  Handed:  Right  AIMS (if indicated):     Assets:  Communication Skills Desire for Improvement Housing Resilience Talents/Skills Transportation  ADL's:  Intact  Cognition:  WNL  Sleep:   ok      Assessment and Plan: Attention deficit disorder, inattentive type.  Major depressive disorder, recurrent.  Anxiety.  Patient is a stable on her current medication.  I reviewed blood work results.  She has no tremors shakes or any EPS.  Continue Zoloft 100 mg daily, trazodone 150 mg daily and Vyvanse chewable 40 mg every morning.  Recommended to call us back if she has any question or any concern.  Follow-up in 3 months.  Follow Up Instructions:    I discussed the assessment and treatment plan with the patient. The patient was provided an opportunity to ask questions and all were  answered. The patient agreed with the plan and demonstrated an understanding of the instructions.   The patient was advised to call back or seek an in-person evaluation if the symptoms worsen or if the condition fails to improve as anticipated.  I provided 12 minutes of non-face-to-face time during this encounter.   Kathlee Nations, MD

## 2020-04-21 ENCOUNTER — Telehealth (HOSPITAL_COMMUNITY): Payer: Self-pay | Admitting: *Deleted

## 2020-04-21 DIAGNOSIS — F900 Attention-deficit hyperactivity disorder, predominantly inattentive type: Secondary | ICD-10-CM

## 2020-04-21 MED ORDER — VYVANSE 40 MG PO CHEW: 40.0000 mg | CHEWABLE_TABLET | ORAL | 0 refills | Status: DC

## 2020-04-21 NOTE — Telephone Encounter (Signed)
Done

## 2020-04-21 NOTE — Telephone Encounter (Signed)
Pt called to request refill of the VyVanse. Pt has an upcoming appointment on 06/10/20.

## 2020-04-28 DIAGNOSIS — R635 Abnormal weight gain: Secondary | ICD-10-CM | POA: Diagnosis not present

## 2020-05-07 DIAGNOSIS — R635 Abnormal weight gain: Secondary | ICD-10-CM | POA: Diagnosis not present

## 2020-05-07 DIAGNOSIS — Z6841 Body Mass Index (BMI) 40.0 and over, adult: Secondary | ICD-10-CM | POA: Diagnosis not present

## 2020-05-07 DIAGNOSIS — E7800 Pure hypercholesterolemia, unspecified: Secondary | ICD-10-CM | POA: Diagnosis not present

## 2020-05-08 DIAGNOSIS — R635 Abnormal weight gain: Secondary | ICD-10-CM | POA: Diagnosis not present

## 2020-05-21 ENCOUNTER — Telehealth (HOSPITAL_COMMUNITY): Payer: Self-pay | Admitting: *Deleted

## 2020-05-21 DIAGNOSIS — F900 Attention-deficit hyperactivity disorder, predominantly inattentive type: Secondary | ICD-10-CM

## 2020-05-21 NOTE — Telephone Encounter (Signed)
Pt called for refill on the VyVanse last written on 04/21/20. Pt has an appointment upcoming on 06/10/20.

## 2020-05-22 MED ORDER — VYVANSE 40 MG PO CHEW: 40.0000 mg | CHEWABLE_TABLET | ORAL | 0 refills | Status: DC

## 2020-05-22 NOTE — Telephone Encounter (Signed)
Done

## 2020-05-27 DIAGNOSIS — I82812 Embolism and thrombosis of superficial veins of left lower extremities: Secondary | ICD-10-CM | POA: Diagnosis not present

## 2020-06-03 DIAGNOSIS — E669 Obesity, unspecified: Secondary | ICD-10-CM | POA: Diagnosis not present

## 2020-06-03 DIAGNOSIS — E785 Hyperlipidemia, unspecified: Secondary | ICD-10-CM | POA: Diagnosis not present

## 2020-06-03 DIAGNOSIS — Z6841 Body Mass Index (BMI) 40.0 and over, adult: Secondary | ICD-10-CM | POA: Diagnosis not present

## 2020-06-07 ENCOUNTER — Other Ambulatory Visit (HOSPITAL_COMMUNITY): Payer: Self-pay | Admitting: Psychiatry

## 2020-06-07 DIAGNOSIS — F330 Major depressive disorder, recurrent, mild: Secondary | ICD-10-CM

## 2020-06-08 DIAGNOSIS — I82812 Embolism and thrombosis of superficial veins of left lower extremities: Secondary | ICD-10-CM | POA: Diagnosis not present

## 2020-06-10 ENCOUNTER — Encounter (HOSPITAL_COMMUNITY): Payer: Self-pay | Admitting: Psychiatry

## 2020-06-10 ENCOUNTER — Telehealth (INDEPENDENT_AMBULATORY_CARE_PROVIDER_SITE_OTHER): Payer: BC Managed Care – PPO | Admitting: Psychiatry

## 2020-06-10 ENCOUNTER — Other Ambulatory Visit: Payer: Self-pay

## 2020-06-10 DIAGNOSIS — F330 Major depressive disorder, recurrent, mild: Secondary | ICD-10-CM | POA: Diagnosis not present

## 2020-06-10 DIAGNOSIS — F900 Attention-deficit hyperactivity disorder, predominantly inattentive type: Secondary | ICD-10-CM | POA: Diagnosis not present

## 2020-06-10 DIAGNOSIS — F419 Anxiety disorder, unspecified: Secondary | ICD-10-CM

## 2020-06-10 MED ORDER — TRAZODONE HCL 100 MG PO TABS
ORAL_TABLET | ORAL | 0 refills | Status: DC
Start: 2020-06-10 — End: 2020-09-02

## 2020-06-10 MED ORDER — SERTRALINE HCL 100 MG PO TABS: 150.0000 mg | ORAL_TABLET | Freq: Every day | ORAL | 0 refills | Status: DC

## 2020-06-10 MED ORDER — VYVANSE 40 MG PO CHEW: 40.0000 mg | CHEWABLE_TABLET | ORAL | 0 refills | Status: DC

## 2020-06-10 NOTE — Progress Notes (Signed)
Virtual Visit via Telephone Note  I connected with Ann Santos on 06/10/20 at  4:00 PM EST by telephone and verified that I am speaking with the correct person using two identifiers.  Location: Patient: Home Provider: Office   I discussed the limitations, risks, security and privacy concerns of performing an evaluation and management service by telephone and the availability of in person appointments. I also discussed with the patient that there may be a patient responsible charge related to this service. The patient expressed understanding and agreed to proceed.   History of Present Illness: Patient is evaluated by phone session.  She endorsed lately more stress and anxiety related to her job.  She has been very busy at work.  She used to work second job but she has to quit because she is not able to finish the job on time.  She reported sometimes she feels her attention and concentration is not as good but also noticed it could be due to underlying anxiety and feeling overwhelmed.  Some nights she has trouble sleeping.  Even though she is taking trazodone at 150 mg but is still there are nights that she has poor sleep.  She denies crying spells or any feeling of hopelessness.  She denies any suicidal thoughts.  She admitted weight gain in few months because she was not doing exercise but now she started weight loss program and able to go back to her previous weight.  She has blood work.  Her hemoglobin A1c is normal.  She has no tremors shakes or any EPS.  She reported no concern from the medication.  She denies drinking or using any illegal substances.  She reported no concern from her marriage life.  She is in therapy with Dr. Shon Hough.   Past Psychiatric History: Started antidepressantin 2009when husbandgoing through drug addiction.Tried Celexa, Wellbutrin, Prozac and Strattera. Had a good response with Zoloft and trazodone.No H/Oinpatient treatment,mania, psychosis, hallucination or  any suicidal attempt.  Psychiatric Specialty Exam: Physical Exam  Review of Systems  Weight 218 lb (98.9 kg).There is no height or weight on file to calculate BMI.  General Appearance: NA  Eye Contact:  NA  Speech:  Normal Rate  Volume:  Normal  Mood:  Anxious and Dysphoric  Affect:  NA  Thought Process:  Goal Directed  Orientation:  Full (Time, Place, and Person)  Thought Content:  Rumination  Suicidal Thoughts:  No  Homicidal Thoughts:  No  Memory:  Immediate;   Good Recent;   Good Remote;   Good  Judgement:  Intact  Insight:  Present  Psychomotor Activity:  NA  Concentration:  Concentration: Fair and Attention Span: Fair  Recall:  Good  Fund of Knowledge:  Good  Language:  Good  Akathisia:  No  Handed:  Right  AIMS (if indicated):     Assets:  Communication Skills Desire for Improvement Housing Social Support Talents/Skills Transportation  ADL's:  Intact  Cognition:  WNL  Sleep:   fair      Assessment and Plan: Attention deficit disorder, inattentive type.  Major depressive disorder, recurrent.  Anxiety.  Discussed current stressors related to work.  Recommend to try trazodone up to 200 mg at bedtime and Zoloft 150 mg daily.  Discussed medication side effects and benefits.  Encouraged to keep therapy with her therapist.  Discussed if medicine did not work then she can call us back.  She like to keep the appointment in 3 months.  Continue Vyvanse chewable 40 mg in  the morning.  Follow-up in 3 months.  Follow Up Instructions:    I discussed the assessment and treatment plan with the patient. The patient was provided an opportunity to ask questions and all were answered. The patient agreed with the plan and demonstrated an understanding of the instructions.   The patient was advised to call back or seek an in-person evaluation if the symptoms worsen or if the condition fails to improve as anticipated.  I provided 19 minutes of non-face-to-face time during this  encounter.   Cleotis Nipper, MD

## 2020-06-18 DIAGNOSIS — Z6841 Body Mass Index (BMI) 40.0 and over, adult: Secondary | ICD-10-CM | POA: Diagnosis not present

## 2020-06-18 DIAGNOSIS — Z713 Dietary counseling and surveillance: Secondary | ICD-10-CM | POA: Diagnosis not present

## 2020-06-22 ENCOUNTER — Encounter: Payer: Self-pay | Admitting: Emergency Medicine

## 2020-06-22 ENCOUNTER — Telehealth (INDEPENDENT_AMBULATORY_CARE_PROVIDER_SITE_OTHER): Payer: BC Managed Care – PPO | Admitting: Emergency Medicine

## 2020-06-22 ENCOUNTER — Other Ambulatory Visit: Payer: Self-pay

## 2020-06-22 DIAGNOSIS — R110 Nausea: Secondary | ICD-10-CM

## 2020-06-22 DIAGNOSIS — J0100 Acute maxillary sinusitis, unspecified: Secondary | ICD-10-CM | POA: Diagnosis not present

## 2020-06-22 DIAGNOSIS — R059 Cough, unspecified: Secondary | ICD-10-CM | POA: Diagnosis not present

## 2020-06-22 DIAGNOSIS — R6889 Other general symptoms and signs: Secondary | ICD-10-CM | POA: Diagnosis not present

## 2020-06-22 MED ORDER — FLUCONAZOLE 150 MG PO TABS: 150.0000 mg | ORAL_TABLET | Freq: Once | ORAL | 0 refills | Status: AC

## 2020-06-22 MED ORDER — VALACYCLOVIR HCL 500 MG PO TABS: 500.0000 mg | ORAL_TABLET | Freq: Two times a day (BID) | ORAL | 5 refills | Status: DC

## 2020-06-22 MED ORDER — ONDANSETRON 4 MG PO TBDP: 4.0000 mg | ORAL_TABLET | Freq: Three times a day (TID) | ORAL | 0 refills | Status: DC | PRN

## 2020-06-22 MED ORDER — AMOXICILLIN-POT CLAVULANATE 875-125 MG PO TABS: 1.0000 | ORAL_TABLET | Freq: Two times a day (BID) | ORAL | 0 refills | Status: AC

## 2020-06-22 NOTE — Progress Notes (Signed)
Telemedicine Encounter- SOAP NOTE Established Patient Patient: Home  Provider: Office     This telephone encounter was conducted with the patient's (or proxy's) verbal consent via audio telecommunications: yes/no: Yes Patient was instructed to have this encounter in a suitably private space; and to only have persons present to whom they give permission to participate. In addition, patient identity was confirmed by use of name plus two identifiers (DOB and address).  I discussed the limitations, risks, security and privacy concerns of performing an evaluation and management service by telephone and the availability of in person appointments. I also discussed with the patient that there may be a patient responsible charge related to this service. The patient expressed understanding and agreed to proceed.  I spent a total of TIME; 0 MIN TO 60 MIN: 20 minutes talking with the patient or their proxy.  Chief Complaint  Patient presents with  . Sore Throat    Patient states she has been having an sinus infection since 05/30/2020. She thought she was getting better and it seems to have got worse. Patient is experiencing  ear and neck pain , congestion , runny nose, and a cough. She has took OTC medications with no relief, and multiple Covid test that were negative.     Subjective   Ann Santos is a 40 y.o. female established patient. Telephone visit today complaining of flulike symptoms that started early February, got a little better, but symptoms have now worsened over the past week.  Complaining of productive cough, nasal congestion and discharge, left earache, and decreased appetite.  Tested negative for Covid several times. No other significant associated symptomatology.  HPI   Patient Active Problem List   Diagnosis Date Noted  . History of DVT (deep vein thrombosis) 08/29/2019  . On continuous oral anticoagulation 08/29/2019  . Morbid obesity (HCC) 11/17/2014  . Diverticular  disease 10/23/2012  . Umbilical hernia 10/26/2011    Past Medical History:  Diagnosis Date  . Abdominal distention   . Abdominal pain   . Anxiety   . Blood in stool   . Constipation   . Diverticulitis    Per pt. States she never had CT scan to confirm this  . Diverticulosis    PT HOSPITALIZED AT [facility] October 03, 2011  . Gestational hypertension 07/05/2013  . Gestational hypertension 07/05/2013  . History of chicken pox    childhood  . History of hiatal hernia   . Hypertension    During pregnancy only  . Maternal obesity, antepartum 07/05/2013  . Maternal obesity, antepartum 07/05/2013  . Numbness    first two fingers right hand   . Rectal bleeding   . Shortness of breath dyspnea    with exercise  . Umbilical hernia    planning surgical repair    Current Outpatient Medications  Medication Sig Dispense Refill  . acetaminophen (TYLENOL) 325 MG tablet Take 650 mg by mouth every 6 (six) hours as needed for headache.    Marland Kitchen CALCIUM PO Take 1 tablet daily by mouth.    . cetirizine (ZYRTEC) 10 MG tablet Take by mouth.    . Cyanocobalamin 1000 MCG SUBL Place under the tongue.    . Lisdexamfetamine Dimesylate (VYVANSE) 40 MG CHEW Chew 40 mg by mouth every morning. 30 tablet 0  . LYSINE PO Take 1 tablet by mouth daily.    . Multiple Vitamins-Minerals (WOMENS MULTIVITAMIN) TABS Take 1 tablet by mouth daily.    . norgestrel-ethinyl estradiol (LO/OVRAL) 0.3-30 MG-MCG  tablet Low-Ogestrel (28) 0.3 mg-30 mcg tablet    . Probiotic Product (PROBIOTIC-10 PO) Probiotic    . SAXENDA 18 MG/3ML SOPN Inject 3 mg into the skin daily.    . sertraline (ZOLOFT) 100 MG tablet Take 1.5 tablets (150 mg total) by mouth daily. 125 tablet 0  . traZODone (DESYREL) 100 MG tablet TAKE ONE TO TWO TABLET BY MOUTH EVERYDAY AT BEDTIME 180 tablet 0  . valACYclovir (VALTREX) 500 MG tablet Take 500 mg by mouth 2 (two) times daily.     No current facility-administered medications for this visit.    No Known  Allergies  Social History   Socioeconomic History  . Marital status: Married    Spouse name: Not on file  . Number of children: 2  . Years of education: Not on file  . Highest education level: Not on file  Occupational History  . Not on file  Tobacco Use  . Smoking status: Never Smoker  . Smokeless tobacco: Never Used  Vaping Use  . Vaping Use: Never used  Substance and Sexual Activity  . Alcohol use: Yes    Alcohol/week: 0.0 standard drinks    Comment: occ  . Drug use: No  . Sexual activity: Yes    Partners: Male    Birth control/protection: Pill  Other Topics Concern  . Not on file  Social History Narrative  . Not on file   Social Determinants of Health   Financial Resource Strain: Not on file  Food Insecurity: Not on file  Transportation Needs: Not on file  Physical Activity: Not on file  Stress: Not on file  Social Connections: Not on file  Intimate Partner Violence: Not on file    Review of Systems  Constitutional: Negative.  Negative for chills and fever.  HENT: Positive for congestion, ear pain and sinus pain.   Respiratory: Positive for cough. Negative for shortness of breath.   Cardiovascular: Negative.  Negative for chest pain and palpitations.  Gastrointestinal: Positive for nausea. Negative for blood in stool, diarrhea, melena and vomiting.  Genitourinary: Negative for dysuria and hematuria.  Musculoskeletal: Negative.  Negative for back pain, myalgias and neck pain.  Skin: Negative.   Neurological: Negative.  Negative for dizziness and headaches.  All other systems reviewed and are negative.   Objective  Alert and oriented x3 in no apparent respiratory distress. Vitals as reported by the patient: There were no vitals filed for this visit.  There are no diagnoses linked to this encounter.  Ann Santos was seen today for sore throat.  Diagnoses and all orders for this visit:  Acute non-recurrent maxillary sinusitis -     amoxicillin-clavulanate  (AUGMENTIN) 875-125 MG tablet; Take 1 tablet by mouth 2 (two) times daily for 7 days. -     fluconazole (DIFLUCAN) 150 MG tablet; Take 1 tablet (150 mg total) by mouth once for 1 dose.  Nausea without vomiting -     ondansetron (ZOFRAN ODT) 4 MG disintegrating tablet; Take 1 tablet (4 mg total) by mouth every 8 (eight) hours as needed for nausea or vomiting.  Flu-like symptoms  Cough  Other orders -     valACYclovir (VALTREX) 500 MG tablet; Take 1 tablet (500 mg total) by mouth 2 (two) times daily.    Clinically stable.  No red flag signs or symptoms. Take medications as prescribed. Advised to contact the office if no better or worse during the next several days.  I discussed the assessment and treatment plan with the patient.  The patient was provided an opportunity to ask questions and all were answered. The patient agreed with the plan and demonstrated an understanding of the instructions.   The patient was advised to call back or seek an in-person evaluation if the symptoms worsen or if the condition fails to improve as anticipated.  I provided 20 minutes of non-face-to-face time during this encounter.  Horald Pollen, MD  Primary Care at [facility]

## 2020-06-22 NOTE — Patient Instructions (Signed)
° ° ° °  If you have lab work done today you will be contacted with your lab results within the next 2 weeks.  If you have not heard from us then please contact us. The fastest way to get your results is to register for My Chart. ° ° °IF you received an x-ray today, you will receive an invoice from [facility]. Please contact [facility] at [phone] with questions or concerns regarding your invoice.  ° °IF you received labwork today, you will receive an invoice from [facility]. Please contact [facility] at [phone] with questions or concerns regarding your invoice.  ° °Our billing staff will not be able to assist you with questions regarding bills from these companies. ° °You will be contacted with the lab results as soon as they are available. The fastest way to get your results is to activate your My Chart account. Instructions are located on the last page of this paperwork. If you have not heard from us regarding the results in 2 weeks, please contact this office. °  ° ° ° °

## 2020-07-02 ENCOUNTER — Other Ambulatory Visit (HOSPITAL_COMMUNITY): Payer: Self-pay | Admitting: Psychiatry

## 2020-07-02 DIAGNOSIS — F419 Anxiety disorder, unspecified: Secondary | ICD-10-CM

## 2020-07-02 DIAGNOSIS — F330 Major depressive disorder, recurrent, mild: Secondary | ICD-10-CM

## 2020-07-03 DIAGNOSIS — Z9884 Bariatric surgery status: Secondary | ICD-10-CM | POA: Diagnosis not present

## 2020-07-11 ENCOUNTER — Other Ambulatory Visit (HOSPITAL_COMMUNITY): Payer: Self-pay | Admitting: Psychiatry

## 2020-07-11 DIAGNOSIS — F419 Anxiety disorder, unspecified: Secondary | ICD-10-CM

## 2020-07-11 DIAGNOSIS — F330 Major depressive disorder, recurrent, mild: Secondary | ICD-10-CM

## 2020-07-13 DIAGNOSIS — E669 Obesity, unspecified: Secondary | ICD-10-CM | POA: Diagnosis not present

## 2020-07-13 DIAGNOSIS — Z713 Dietary counseling and surveillance: Secondary | ICD-10-CM | POA: Diagnosis not present

## 2020-07-17 ENCOUNTER — Telehealth (HOSPITAL_COMMUNITY): Payer: Self-pay | Admitting: *Deleted

## 2020-07-17 DIAGNOSIS — F900 Attention-deficit hyperactivity disorder, predominantly inattentive type: Secondary | ICD-10-CM

## 2020-07-17 MED ORDER — VYVANSE 40 MG PO CHEW: 40.0000 mg | CHEWABLE_TABLET | ORAL | 0 refills | Status: DC

## 2020-07-17 NOTE — Telephone Encounter (Signed)
Pt called requesting refill of VyVanse. Pt has an upcoming appointment on 09/02/20.

## 2020-07-17 NOTE — Telephone Encounter (Signed)
Done

## 2020-07-24 ENCOUNTER — Encounter (HOSPITAL_COMMUNITY): Payer: Self-pay

## 2020-07-24 ENCOUNTER — Other Ambulatory Visit: Payer: Self-pay

## 2020-07-24 ENCOUNTER — Ambulatory Visit (HOSPITAL_COMMUNITY)
Admission: EM | Admit: 2020-07-24 | Discharge: 2020-07-24 | Disposition: A | Discharge status: Home / Self Care | Payer: BC Managed Care – PPO | Attending: Emergency Medicine | Admitting: Emergency Medicine

## 2020-07-24 DIAGNOSIS — K5732 Diverticulitis of large intestine without perforation or abscess without bleeding: Secondary | ICD-10-CM | POA: Diagnosis not present

## 2020-07-24 MED ORDER — OXYCODONE-ACETAMINOPHEN 5-325 MG PO TABS: 1.0000 | ORAL_TABLET | Freq: Four times a day (QID) | ORAL | 0 refills | Status: DC | PRN

## 2020-07-24 MED ORDER — FLUCONAZOLE 150 MG PO TABS: 150.0000 mg | ORAL_TABLET | Freq: Once | ORAL | 0 refills | Status: DC

## 2020-07-24 MED ORDER — FLUCONAZOLE 150 MG PO TABS: 150.0000 mg | ORAL_TABLET | Freq: Once | ORAL | 0 refills | Status: AC

## 2020-07-24 MED ORDER — ONDANSETRON 4 MG PO TBDP: 4.0000 mg | ORAL_TABLET | Freq: Three times a day (TID) | ORAL | 0 refills | Status: DC | PRN

## 2020-07-24 MED ORDER — CIPROFLOXACIN HCL 500 MG PO TABS: 500.0000 mg | ORAL_TABLET | Freq: Two times a day (BID) | ORAL | 0 refills | Status: DC

## 2020-07-24 MED ORDER — METRONIDAZOLE 500 MG PO TABS: 500.0000 mg | ORAL_TABLET | Freq: Two times a day (BID) | ORAL | 0 refills | Status: DC

## 2020-07-24 NOTE — ED Provider Notes (Signed)
[facility]  ____________________________________________  Time seen: Approximately 12:04 PM  I have reviewed the triage vital signs and the nursing notes.   HISTORY  Chief Complaint Abdominal Pain    HPI Ann Santos is a 40 y.o. female who presents the emergency department complaining of left lower quadrant abdominal pain.  Patient has a history of diverticulitis, states that she has having her typical symptoms at the start of the diverticulitis flare.  Patient is having some increased pain, sensations of constipation.  Patient typically has her antibiotics called in by her provider but unfortunately he was unable to be seen until Tuesday.  Patient denies any fevers or chills, no diarrhea, no blood in the stools.  Patient has no nausea or vomiting.  She states that it is a cramping sensation consistent with the start of a normal flare.  Does have a history of diverticulitis, constipation, hypertension, DVT.  Other than diverticulitis, no complaints of chronic medical issues.  Patient is taken over-the-counter medications including stool softener and Tylenol with no relief.  Patient does have a history of of bariatric surgery.   Patient denies any dysuria, polyuria, hematuria.  No radiation of the pain.  No vaginal bleeding or discharge.        Past Medical History:  Diagnosis Date  . Abdominal distention   . Abdominal pain   . Anxiety   . Blood in stool   . Constipation   . Diverticulitis    Per pt. States she never had CT scan to confirm this  . Diverticulosis    [facility]  . Gestational hypertension 07/05/2013  . Gestational hypertension 07/05/2013  . History of chicken pox    childhood  . History of hiatal hernia   . Hypertension    During pregnancy only  . Maternal obesity, antepartum 07/05/2013  . Maternal obesity, antepartum 07/05/2013  . Numbness    first two fingers right hand   . Rectal bleeding   . Shortness  of breath dyspnea    with exercise  . Umbilical hernia    planning surgical repair    Patient Active Problem List   Diagnosis Date Noted  . History of DVT (deep vein thrombosis) 08/29/2019  . On continuous oral anticoagulation 08/29/2019  . Morbid obesity (HCC) 11/17/2014  . Diverticular disease 10/23/2012  . Umbilical hernia 10/26/2011    Past Surgical History:  Procedure Laterality Date  . BREATH TEK H PYLORI N/A 07/22/2014   Procedure: BREATH TEK H PYLORI;  Surgeon: Ovidio Kinavid Newman, MD;  [facility];  Service: General;  Laterality: N/A;  . CESAREAN SECTION  05/28/09  . CESAREAN SECTION N/A 07/05/2013   Procedure: REPEAT CESAREAN SECTION (With Special Wound Vac);  Surgeon: Robley FriesVaishali R Mody, MD;  Location: [facility];  Service: Obstetrics;  Laterality: N/A;   EDD: 07/23/13  . COLONOSCOPY  10/07/2011   Procedure: COLONOSCOPY;  Surgeon: Theda BelfastPatrick D Hung, MD;  Location: [facility];  Service: Endoscopy;  Laterality: N/A;  . ESOPHAGOGASTRODUODENOSCOPY  10/07/2011   Procedure: ESOPHAGOGASTRODUODENOSCOPY (EGD);  Surgeon: Theda BelfastPatrick D Hung, MD;  [facility];  Service: Endoscopy;  Laterality: N/A;  . LAPAROSCOPIC GASTRIC SLEEVE RESECTION WITH HIATAL HERNIA REPAIR N/A 11/17/2014   Procedure: LAPAROSCOPIC GASTRIC SLEEVE RESECTION ;  Surgeon: Ovidio Kinavid Newman, MD;  Location: [facility];  Service: General;  Laterality: N/A;  . SCAR REVISION  09/17/09   c-section scar revision  . UMBILICAL HERNIA REPAIR  11/01/2011   Procedure:  HERNIA REPAIR UMBILICAL ADULT;  Surgeon: Velora Heckler, MD;  Location: [facility];  Service: General;  Laterality: N/A;    Prior to Admission medications   Medication Sig Start Date End Date Taking? Authorizing Provider  ciprofloxacin (CIPRO) 500 MG tablet Take 1 tablet (500 mg total) by mouth 2 (two) times daily. 07/24/20  Yes Maryetta Shafer, Delorise Royals, PA-C  fluconazole (DIFLUCAN) 150 MG tablet Take 1 tablet (150 mg total) by mouth once for 1 dose. Take tablets 3 days apart 07/24/20  07/24/20 Yes Kalayah Leske, Delorise Royals, PA-C  metroNIDAZOLE (FLAGYL) 500 MG tablet Take 1 tablet (500 mg total) by mouth 2 (two) times daily. 07/24/20  Yes Neveah Bang, Delorise Royals, PA-C  ondansetron (ZOFRAN-ODT) 4 MG disintegrating tablet Take 1 tablet (4 mg total) by mouth every 8 (eight) hours as needed for nausea or vomiting. 07/24/20  Yes Dejuana Weist, Delorise Royals, PA-C  oxyCODONE-acetaminophen (PERCOCET/ROXICET) 5-325 MG tablet Take 1 tablet by mouth every 6 (six) hours as needed for severe pain. 07/24/20  Yes Bomani Oommen, Delorise Royals, PA-C  acetaminophen (TYLENOL) 325 MG tablet Take 650 mg by mouth every 6 (six) hours as needed for headache.    [provider]  CALCIUM PO Take 1 tablet daily by mouth.    [provider]  cetirizine (ZYRTEC) 10 MG tablet Take by mouth.    [provider]  Cyanocobalamin 1000 MCG SUBL Place under the tongue.    [provider]  Lisdexamfetamine Dimesylate (VYVANSE) 40 MG CHEW Chew 40 mg by mouth every morning. 07/17/20   Arfeen, Phillips Grout, MD  LYSINE PO Take 1 tablet by mouth daily.    [provider]  Multiple Vitamins-Minerals (WOMENS MULTIVITAMIN) TABS Take 1 tablet by mouth daily.    [provider]  norgestrel-ethinyl estradiol (LO/OVRAL) 0.3-30 MG-MCG tablet Low-Ogestrel (28) 0.3 mg-30 mcg tablet    [provider]  Probiotic Product (PROBIOTIC-10 PO) Probiotic    [provider]  SAXENDA 18 MG/3ML SOPN Inject 3 mg into the skin daily. 08/15/19   [provider]  sertraline (ZOLOFT) 100 MG tablet Take 1.5 tablets (150 mg total) by mouth daily. 06/10/20   Arfeen, Phillips Grout, MD  traZODone (DESYREL) 100 MG tablet TAKE ONE TO TWO TABLET BY MOUTH EVERYDAY AT BEDTIME 06/10/20   Arfeen, Phillips Grout, MD  valACYclovir (VALTREX) 500 MG tablet Take 1 tablet (500 mg total) by mouth 2 (two) times daily. 06/22/20   Georgina Quint, MD    Allergies Patient has no known allergies.  Family History  Problem Relation  Age of Onset  . Diabetes Mother   . Hypertension Mother   . Ulcerative colitis Father   . Hypertension Father   . Cancer Maternal Grandmother        lung    Social History Social History   Tobacco Use  . Smoking status: Never Smoker  . Smokeless tobacco: Never Used  Vaping Use  . Vaping Use: Never used  Substance Use Topics  . Alcohol use: Yes    Alcohol/week: 0.0 standard drinks    Comment: occ  . Drug use: No     Review of Systems  Constitutional: No fever/chills Eyes: No visual changes. No discharge ENT: No upper respiratory complaints. Cardiovascular: no chest pain. Respiratory: no cough. No SOB. Gastrointestinal: No abdominal pain.  No nausea, no vomiting.  No diarrhea.  No constipation. Genitourinary: Negative for dysuria. No hematuria Musculoskeletal: Negative for musculoskeletal pain. Skin: Negative for rash, abrasions, lacerations, ecchymosis. Neurological: Negative for headaches,  focal weakness or numbness.  10 System ROS otherwise negative.  ____________________________________________   PHYSICAL EXAM:  VITAL SIGNS: ED Triage Vitals [07/24/20 1202]  Enc Vitals Group     BP      Pulse      Resp      Temp      Temp src      SpO2      Weight      Height      Head Circumference      Peak Flow      Pain Score 8     Pain Loc      Pain Edu?      Excl. in GC?      Constitutional: Alert and oriented. Well appearing and in no acute distress. Eyes: Conjunctivae are normal. PERRL. EOMI. Head: Atraumatic. ENT:      Ears:       Nose: No congestion/rhinnorhea.      Mouth/Throat: Mucous membranes are moist.  Neck: No stridor.    Cardiovascular: Normal rate, regular rhythm. Normal S1 and S2.  Good peripheral circulation. Respiratory: Normal respiratory effort without tachypnea or retractions. Lungs CTAB. Good air entry to the bases with no decreased or absent breath sounds. Gastrointestinal: Bowel sounds 4 quadrants.  No visible external abdominal  wall findings.  Soft and nontender to palpation all 4 quadrants.. No guarding or rigidity. No palpable masses. No distention. No CVA tenderness. Musculoskeletal: Full range of motion to all extremities. No gross deformities appreciated. Neurologic:  Normal speech and language. No gross focal neurologic deficits are appreciated.  Skin:  Skin is warm, dry and intact. No rash noted. Psychiatric: Mood and affect are normal. Speech and behavior are normal. Patient exhibits appropriate insight and judgement.   ____________________________________________   LABS (all labs ordered are listed, but only abnormal results are displayed)  Labs Reviewed - No data to display ____________________________________________  EKG   ____________________________________________  RADIOLOGY   No results found.  ____________________________________________    PROCEDURES  Procedure(s) performed:    Procedures    Medications - No data to display   ____________________________________________   INITIAL IMPRESSION / ASSESSMENT AND PLAN / ED COURSE  Pertinent labs & imaging results that were available during my care of the patient were reviewed by me and considered in my medical decision making (see chart for details).  Review of the [place] CSRS was performed in accordance of the [facility] prior to dispensing any controlled drugs.           Patient's diagnosis is consistent with diverticulitis.  Patient presents emergency department complaining of left lower quadrant cramping.  Patient has a history of diverticulitis and states that this is typically how her flare starts.  She is currently experiencing no bleeding or darkened stools.  No nausea or emesis.  No fevers or chills.  Patient states that she typically receives prescriptions from her PCP for this however she was unable to be seen until Tuesday which is in 4 days.  Patient states that typically she waits that long she starts having  complications and will typically end up in the emergency department.  Patient is requesting her typical Cipro, Flagyl, Zofran and limited pain medication.  She also states that she typically will develop a yeast infection secondary to the antibiotic use and is requesting Diflucan.  Patient had no urinary symptoms.  No vaginal bleeding or discharge.  Patient was complaining of left lower quadrant pain but there is no tenderness on exam.  Given the reassuring vital signs, patient's known history with similar symptoms I will treat the patient with her typical regimen.  I have given strict follow-up precautions to include fevers or chills, inability to keep foods or medicines down, worsening left lower quadrant pain, dark/tarry stools, any frank GI bleeding.  If patient develops any other symptoms such as dysuria, polyuria, hematuria, vaginal bleeding or discharge she should also follow-up.  Patient is agreeable with this plan.  No labs or imaging at this time.  Patient will have prescriptions for Cipro, Flagyl, Diflucan, Zofran, #8 Percocet.  Patient is given ED precautions to return to the ED for any worsening or new symptoms.     ____________________________________________  FINAL CLINICAL IMPRESSION(S) / ED DIAGNOSES  Final diagnoses:  Diverticulitis of colon      NEW MEDICATIONS STARTED DURING THIS VISIT:  ED Discharge Orders         Ordered    ondansetron (ZOFRAN-ODT) 4 MG disintegrating tablet  Every 8 hours PRN        07/24/20 1236    metroNIDAZOLE (FLAGYL) 500 MG tablet  2 times daily        07/24/20 1236    ciprofloxacin (CIPRO) 500 MG tablet  2 times daily        07/24/20 1236    oxyCODONE-acetaminophen (PERCOCET/ROXICET) 5-325 MG tablet  Every 6 hours PRN        07/24/20 1236    fluconazole (DIFLUCAN) 150 MG tablet   Once        07/24/20 1236              This chart was dictated using voice recognition software/Dragon. Despite best efforts to proofread, errors can occur  which can change the meaning. Any change was purely unintentional.    Racheal Patches, PA-C 07/24/20 1237

## 2020-07-24 NOTE — ED Triage Notes (Signed)
Pt presents with left lower quadrat abdominal pain, bloated x 1 week./ Pt states she is having a diverticulitis flare up and is not able to see her PCP today. Tylenol gives no relief.

## 2020-07-28 DIAGNOSIS — R1032 Left lower quadrant pain: Secondary | ICD-10-CM | POA: Diagnosis not present

## 2020-07-28 DIAGNOSIS — R1013 Epigastric pain: Secondary | ICD-10-CM | POA: Diagnosis not present

## 2020-07-28 DIAGNOSIS — R5383 Other fatigue: Secondary | ICD-10-CM | POA: Diagnosis not present

## 2020-07-29 ENCOUNTER — Other Ambulatory Visit (HOSPITAL_COMMUNITY): Payer: Self-pay | Admitting: Psychiatry

## 2020-07-29 DIAGNOSIS — F419 Anxiety disorder, unspecified: Secondary | ICD-10-CM

## 2020-07-29 DIAGNOSIS — F330 Major depressive disorder, recurrent, mild: Secondary | ICD-10-CM

## 2020-08-19 ENCOUNTER — Telehealth (HOSPITAL_COMMUNITY): Payer: Self-pay | Admitting: *Deleted

## 2020-08-19 DIAGNOSIS — F900 Attention-deficit hyperactivity disorder, predominantly inattentive type: Secondary | ICD-10-CM

## 2020-08-19 MED ORDER — VYVANSE 40 MG PO CHEW: 40.0000 mg | CHEWABLE_TABLET | ORAL | 0 refills | Status: DC

## 2020-08-19 NOTE — Telephone Encounter (Signed)
Pt called requesting refill of the VyVanse 40 mg. She has an upcoming appointment on 09/02/20.

## 2020-08-19 NOTE — Telephone Encounter (Signed)
Done

## 2020-08-31 ENCOUNTER — Other Ambulatory Visit (HOSPITAL_COMMUNITY): Payer: Self-pay | Admitting: Psychiatry

## 2020-08-31 DIAGNOSIS — R1032 Left lower quadrant pain: Secondary | ICD-10-CM | POA: Diagnosis not present

## 2020-08-31 DIAGNOSIS — F330 Major depressive disorder, recurrent, mild: Secondary | ICD-10-CM

## 2020-08-31 DIAGNOSIS — K5900 Constipation, unspecified: Secondary | ICD-10-CM | POA: Diagnosis not present

## 2020-08-31 DIAGNOSIS — R140 Abdominal distension (gaseous): Secondary | ICD-10-CM | POA: Diagnosis not present

## 2020-09-02 ENCOUNTER — Other Ambulatory Visit: Payer: Self-pay

## 2020-09-02 ENCOUNTER — Encounter (HOSPITAL_COMMUNITY): Payer: Self-pay | Admitting: Psychiatry

## 2020-09-02 ENCOUNTER — Telehealth (INDEPENDENT_AMBULATORY_CARE_PROVIDER_SITE_OTHER): Payer: BC Managed Care – PPO | Admitting: Psychiatry

## 2020-09-02 DIAGNOSIS — F900 Attention-deficit hyperactivity disorder, predominantly inattentive type: Secondary | ICD-10-CM

## 2020-09-02 DIAGNOSIS — F419 Anxiety disorder, unspecified: Secondary | ICD-10-CM

## 2020-09-02 DIAGNOSIS — F330 Major depressive disorder, recurrent, mild: Secondary | ICD-10-CM

## 2020-09-02 MED ORDER — TRAZODONE HCL 100 MG PO TABS
ORAL_TABLET | ORAL | 0 refills | Status: DC
Start: 2020-09-02 — End: 2020-12-04

## 2020-09-02 MED ORDER — SERTRALINE HCL 100 MG PO TABS: 150.0000 mg | ORAL_TABLET | Freq: Every day | ORAL | 0 refills | Status: DC

## 2020-09-02 NOTE — Progress Notes (Signed)
Virtual Visit via Telephone Note  I connected with Ann Santos on 09/02/20 at  4:00 PM EDT by telephone and verified that I am speaking with the correct person using two identifiers.  Location: Patient: Home Provider: Home Office   I discussed the limitations, risks, security and privacy concerns of performing an evaluation and management service by telephone and the availability of in person appointments. I also discussed with the patient that there may be a patient responsible charge related to this service. The patient expressed understanding and agreed to proceed.   History of Present Illness: Patient is evaluated by phone session.  She is feeling better since dose of the trazodone and Zoloft were adjusted.  She is less anxious but job remains very stressful.  She is excited because she will moved and switched to a new job which will be less stressful.  She has fewer students and less paperwork.  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  She has a plan to take summer classes to keep herself busy.  Recently she had abdominal pain and she saw GI and found to have diverticulitis.  She is taking antibiotic.  Her weight is stable.  She denies any irritability, crying spells, feeling of hopelessness or worthlessness.  She feels her Vyvanse helping her ADD symptoms.  She is able to do multitasking and able to finish her job on time.  She is sleeping good.  She wants to keep the current medication.  She takes the trazodone 100-150 mg at bedtime.  She has no tremors, shakes or any EPS.  She is in therapy with [facility].  She noticed her marriage life is much better and her husband is very supportive and helpful.     Past Psychiatric History: Started antidepressantin 2009when husbandgoing through drug addiction.Tried Celexa, Wellbutrin, Prozac and Strattera. Had a good response with Zoloft and trazodone.No H/Oinpatient treatment,mania, psychosis, hallucination or any suicidal  attempt.  Psychiatric Specialty Exam: Physical Exam  Review of Systems  Weight 218 lb (98.9 kg).There is no height or weight on file to calculate BMI.  General Appearance: NA  Eye Contact:  NA  Speech:  Clear and Coherent and fast  Volume:  Normal  Mood:  Anxious  Affect:  NA  Thought process:  Goal Directed  Orientation:  Full (Time, Place, and Person)  Thought Content:  WDL  Suicidal Thoughts:  No  Homicidal Thoughts:  No  Memory:  Immediate;   Good Recent;   Good Remote;   Good  Judgement:  Good  Insight:  Present  Psychomotor Activity:  NA  Concentration:  Concentration: Good and Attention Span: Good  Recall:  Good  Fund of Knowledge:  Good  Language:  Good  Akathisia:  No  Handed:  Right  AIMS (if indicated):     Assets:  Communication Skills Desire for Improvement Housing Resilience Social Support Talents/Skills Transportation  ADL's:  Intact  Cognition:  WNL  Sleep:   better      Assessment and Plan: Attention deficit disorder, inattentive type.  Major depressive disorder, recurrent with anxiety.  Patient doing better since adjusting the dose of trazodone and Zoloft.  Some nights she takes trazodone 100 and other nights she takes 150 mg.  She reported no tremors shakes.  Continue Zoloft 150 mg daily and present dose of trazodone.  Continue Vyvanse 40 mg in the morning.  Recommended to call us back if there is any question or any concern.  Follow-up in 3 months.  Follow Up Instructions:    I discussed the assessment and treatment plan with the patient. The patient was provided an opportunity to ask questions and all were answered. The patient agreed with the plan and demonstrated an understanding of the instructions.   The patient was advised to call back or seek an in-person evaluation if the symptoms worsen or if the condition fails to improve as anticipated.  I provided 16 minutes of non-face-to-face time during this encounter.   Cleotis Nipper,  MD

## 2020-09-18 ENCOUNTER — Telehealth (HOSPITAL_COMMUNITY): Payer: Self-pay | Admitting: *Deleted

## 2020-09-18 DIAGNOSIS — F900 Attention-deficit hyperactivity disorder, predominantly inattentive type: Secondary | ICD-10-CM

## 2020-09-18 NOTE — Telephone Encounter (Signed)
Pt called requesting refill of the VyVanse. She has an upcoming appointment on 12/07/20.

## 2020-09-21 MED ORDER — VYVANSE 40 MG PO CHEW: 40.0000 mg | CHEWABLE_TABLET | ORAL | 0 refills | Status: DC

## 2020-09-21 NOTE — Telephone Encounter (Signed)
Done

## 2020-10-01 DIAGNOSIS — Z309 Encounter for contraceptive management, unspecified: Secondary | ICD-10-CM | POA: Diagnosis not present

## 2020-10-01 DIAGNOSIS — Z1231 Encounter for screening mammogram for malignant neoplasm of breast: Secondary | ICD-10-CM | POA: Diagnosis not present

## 2020-10-01 DIAGNOSIS — Z01419 Encounter for gynecological examination (general) (routine) without abnormal findings: Secondary | ICD-10-CM | POA: Diagnosis not present

## 2020-10-01 DIAGNOSIS — Z6841 Body Mass Index (BMI) 40.0 and over, adult: Secondary | ICD-10-CM | POA: Diagnosis not present

## 2020-10-22 ENCOUNTER — Telehealth (HOSPITAL_COMMUNITY): Payer: Self-pay | Admitting: *Deleted

## 2020-10-22 DIAGNOSIS — F900 Attention-deficit hyperactivity disorder, predominantly inattentive type: Secondary | ICD-10-CM

## 2020-10-22 MED ORDER — VYVANSE 40 MG PO CHEW: 40.0000 mg | CHEWABLE_TABLET | ORAL | 0 refills | Status: DC

## 2020-10-22 NOTE — Telephone Encounter (Signed)
Pt requesting refill of VyVanse 40 mg chewable. Pt has an appointment upcoming on 12/07/20.

## 2020-10-22 NOTE — Telephone Encounter (Signed)
Done

## 2020-10-23 DIAGNOSIS — Z713 Dietary counseling and surveillance: Secondary | ICD-10-CM | POA: Diagnosis not present

## 2020-11-06 DIAGNOSIS — Z6838 Body mass index (BMI) 38.0-38.9, adult: Secondary | ICD-10-CM | POA: Diagnosis not present

## 2020-11-06 DIAGNOSIS — E669 Obesity, unspecified: Secondary | ICD-10-CM | POA: Diagnosis not present

## 2020-11-06 DIAGNOSIS — E7800 Pure hypercholesterolemia, unspecified: Secondary | ICD-10-CM | POA: Diagnosis not present

## 2020-11-11 DIAGNOSIS — Z30430 Encounter for insertion of intrauterine contraceptive device: Secondary | ICD-10-CM | POA: Diagnosis not present

## 2020-11-17 DIAGNOSIS — R928 Other abnormal and inconclusive findings on diagnostic imaging of breast: Secondary | ICD-10-CM | POA: Diagnosis not present

## 2020-11-19 ENCOUNTER — Telehealth (HOSPITAL_COMMUNITY): Payer: Self-pay | Admitting: *Deleted

## 2020-11-19 DIAGNOSIS — F900 Attention-deficit hyperactivity disorder, predominantly inattentive type: Secondary | ICD-10-CM

## 2020-11-19 MED ORDER — VYVANSE 40 MG PO CHEW: 40.0000 mg | CHEWABLE_TABLET | ORAL | 0 refills | Status: DC

## 2020-11-19 NOTE — Telephone Encounter (Signed)
Done

## 2020-11-19 NOTE — Telephone Encounter (Signed)
Pt called requesting a refill of VyVanse 40 mg which ws last written 10/22/20. Pt has an appointment scheduled for 12/07/20.

## 2020-11-20 ENCOUNTER — Other Ambulatory Visit (HOSPITAL_COMMUNITY): Payer: Self-pay | Admitting: Psychiatry

## 2020-11-20 DIAGNOSIS — F419 Anxiety disorder, unspecified: Secondary | ICD-10-CM

## 2020-11-20 DIAGNOSIS — F330 Major depressive disorder, recurrent, mild: Secondary | ICD-10-CM

## 2020-12-03 ENCOUNTER — Other Ambulatory Visit (HOSPITAL_COMMUNITY): Payer: Self-pay | Admitting: *Deleted

## 2020-12-03 DIAGNOSIS — F330 Major depressive disorder, recurrent, mild: Secondary | ICD-10-CM

## 2020-12-04 ENCOUNTER — Other Ambulatory Visit (HOSPITAL_COMMUNITY): Payer: Self-pay | Admitting: *Deleted

## 2020-12-04 DIAGNOSIS — F330 Major depressive disorder, recurrent, mild: Secondary | ICD-10-CM

## 2020-12-04 MED ORDER — TRAZODONE HCL 150 MG PO TABS: ORAL_TABLET | ORAL | 0 refills | Status: DC

## 2020-12-07 ENCOUNTER — Telehealth (INDEPENDENT_AMBULATORY_CARE_PROVIDER_SITE_OTHER): Payer: BC Managed Care – PPO | Admitting: Psychiatry

## 2020-12-07 ENCOUNTER — Encounter (HOSPITAL_COMMUNITY): Payer: Self-pay | Admitting: Psychiatry

## 2020-12-07 ENCOUNTER — Other Ambulatory Visit: Payer: Self-pay

## 2020-12-07 DIAGNOSIS — F419 Anxiety disorder, unspecified: Secondary | ICD-10-CM | POA: Diagnosis not present

## 2020-12-07 DIAGNOSIS — F330 Major depressive disorder, recurrent, mild: Secondary | ICD-10-CM

## 2020-12-07 DIAGNOSIS — F900 Attention-deficit hyperactivity disorder, predominantly inattentive type: Secondary | ICD-10-CM

## 2020-12-07 MED ORDER — VYVANSE 40 MG PO CHEW: 40.0000 mg | CHEWABLE_TABLET | ORAL | 0 refills | Status: DC

## 2020-12-07 MED ORDER — SERTRALINE HCL 100 MG PO TABS: 150.0000 mg | ORAL_TABLET | Freq: Every day | ORAL | 0 refills | Status: DC

## 2020-12-07 MED ORDER — TRAZODONE HCL 100 MG PO TABS: ORAL_TABLET | ORAL | 0 refills | Status: DC

## 2020-12-07 NOTE — Progress Notes (Signed)
Virtual Visit via Telephone Note  I connected with Ann Santos on 12/07/20 at  3:20 PM EDT by telephone and verified that I am speaking with the correct person using two identifiers.  Location: Patient: Home Provider: Office   I discussed the limitations, risks, security and privacy concerns of performing an evaluation and management service by telephone and the availability of in person appointments. I also discussed with the patient that there may be a patient responsible charge related to this service. The patient expressed understanding and agreed to proceed.   History of Present Illness: Patient is evaluated by phone session.  She is taking her medication and she feels that they are working well.  Some nights she has to take the trazodone 200 mg to had a good night sleep.  She is working during the summer but now she is ready for normal schedule.  She has no issues with the medication.  She denies any irritability, anger, mood swings or any highs and lows.  She denies any crying spells or any feeling of hopelessness or worthlessness.  She did not check her weight but reported her appetite is okay and her weight is stable.  She is in therapy with Dr. Shon Hough.  Her marriage life is going well and her relationship with her husband is also very supportive.  She wants to keep the current medication but like to have [facility]one [facility] she does take 200 mg.  Past Psychiatric History:  Started antidepressant in 2009 when husband going through drug addiction. Tried Celexa, Wellbutrin, Prozac and Strattera.  Had a good response with Zoloft and trazodone. No H/O inpatient treatment, mania, psychosis, hallucination or any suicidal attempt.  Psychiatric Specialty Exam: Physical Exam  Review of Systems  There were no vitals taken for this visit.There is no height or weight on file to calculate BMI.  General Appearance: NA  Eye Contact:  NA  Speech:  Clear and Coherent  Volume:  Normal   Mood:  Euthymic  Affect:  NA  Thought Process:  Goal Directed  Orientation:  Full (Time, Place, and Person)  Thought Content:  WDL  Suicidal Thoughts:  No  Homicidal Thoughts:  No  Memory:  Immediate;   Good Recent;   Good Remote;   Good  Judgement:  Intact  Insight:  Present  Psychomotor Activity:  NA  Concentration:  Concentration: Good and Attention Span: Good  Recall:  Good  Fund of Knowledge:  Good  Language:  Good  Akathisia:  No  Handed:  Right  AIMS (if indicated):     Assets:  Communication Skills Desire for Improvement Housing Resilience Social Support Talents/Skills Transportation  ADL's:  Intact  Cognition:  WNL  Sleep:   ok      Assessment and Plan: Attention deficit disorder, inattentive type.  Major depressive disorder, recurrent.  Anxiety.  Patient stable on her current medication however some nights she does take trazodone 200 mg.  We will try giving her trazodone 200 mg but keep the current dose of Zoloft 150 mg and Vyvanse 40 mg in the morning.  Recommended to call us back if she has any question or any concern.  Follow-up in 3 months.  Follow Up Instructions:    I discussed the assessment and treatment plan with the patient. The patient was provided an opportunity to ask questions and all were answered. The patient agreed with the plan and demonstrated an understanding of the instructions.   The patient was advised to  call back or seek an in-person evaluation if the symptoms worsen or if the condition fails to improve as anticipated.  I provided 12 minutes of non-face-to-face time during this encounter.   Cleotis Nipper, MD

## 2020-12-09 ENCOUNTER — Other Ambulatory Visit (HOSPITAL_COMMUNITY): Payer: Self-pay | Admitting: Psychiatry

## 2020-12-09 DIAGNOSIS — F330 Major depressive disorder, recurrent, mild: Secondary | ICD-10-CM

## 2020-12-16 DIAGNOSIS — Z30431 Encounter for routine checking of intrauterine contraceptive device: Secondary | ICD-10-CM | POA: Diagnosis not present

## 2020-12-16 DIAGNOSIS — T8389XA Other specified complication of genitourinary prosthetic devices, implants and grafts, initial encounter: Secondary | ICD-10-CM | POA: Diagnosis not present

## 2021-01-13 ENCOUNTER — Telehealth (HOSPITAL_COMMUNITY): Payer: Self-pay | Admitting: *Deleted

## 2021-01-13 DIAGNOSIS — F330 Major depressive disorder, recurrent, mild: Secondary | ICD-10-CM

## 2021-01-13 NOTE — Telephone Encounter (Signed)
She was given trazodone 180 tablets and she should be okay if she is taking 200 mg every night.  Early Vyvanse may not be approved by her insurance since it is 1 week early.

## 2021-01-13 NOTE — Telephone Encounter (Signed)
Yes

## 2021-01-13 NOTE — Telephone Encounter (Signed)
Do you want me to ask her to call when she runs out and send refill then?

## 2021-01-13 NOTE — Telephone Encounter (Signed)
Pt called requesting early refill of VyVanse 40 mg chew as she is going out of town and will run out during that time. Fill date should be 01/20/21. Pt also asked about increasing Trazodone back to 200 mg, current Rx ordered a s150 mg hs. Pt stated that she's always taken 200 mg. Please review and advise.

## 2021-01-18 ENCOUNTER — Telehealth (HOSPITAL_COMMUNITY): Payer: Self-pay | Admitting: *Deleted

## 2021-01-18 DIAGNOSIS — F330 Major depressive disorder, recurrent, mild: Secondary | ICD-10-CM

## 2021-01-18 NOTE — Telephone Encounter (Signed)
VM left for pt regarding early refill of Vyvanse.per Dr. Lolly Mustache no early refill. Due on 01/20/21.

## 2021-01-18 NOTE — Telephone Encounter (Signed)
Writer received another call from pt regarding refilling Vyvanse earlier than fill date of 01/20/21. Pt also states that she will not have enough Trazodone to last until next appointment on 03/08/21. Pt received Trazodone 100 mg (1 qhs) #180 on 12/07/20 which should be adequate to 200 mg until upcoming appointment on 11/14. Pt advised that Dr. Lolly Mustache will refill Vyvanse on 01/20/21 and script can be sent to pharmacy where she will be staying as she says going out of town.

## 2021-01-20 ENCOUNTER — Telehealth (HOSPITAL_COMMUNITY): Payer: Self-pay | Admitting: *Deleted

## 2021-01-20 ENCOUNTER — Other Ambulatory Visit (HOSPITAL_COMMUNITY): Payer: Self-pay | Admitting: Psychiatry

## 2021-01-20 DIAGNOSIS — F900 Attention-deficit hyperactivity disorder, predominantly inattentive type: Secondary | ICD-10-CM

## 2021-01-20 MED ORDER — VYVANSE 40 MG PO CHEW
40.0000 mg | CHEWABLE_TABLET | ORAL | 0 refills | Status: DC
Start: 2021-01-20 — End: 2021-02-18

## 2021-01-20 NOTE — Telephone Encounter (Signed)
Pt of Dr. Lolly Mustache called requesting a refill of Vyvanse 40 mg Chew. Pt has an upcoming appointment on 03/08/21. Please review. Thank you.

## 2021-01-20 NOTE — Telephone Encounter (Signed)
sent 

## 2021-01-25 MED ORDER — TRAZODONE HCL 100 MG PO TABS: ORAL_TABLET | ORAL | 0 refills | Status: DC

## 2021-02-14 ENCOUNTER — Other Ambulatory Visit (HOSPITAL_COMMUNITY): Payer: Self-pay | Admitting: Psychiatry

## 2021-02-14 DIAGNOSIS — F330 Major depressive disorder, recurrent, mild: Secondary | ICD-10-CM

## 2021-02-14 DIAGNOSIS — F419 Anxiety disorder, unspecified: Secondary | ICD-10-CM

## 2021-02-16 ENCOUNTER — Other Ambulatory Visit (HOSPITAL_COMMUNITY): Payer: Self-pay | Admitting: Psychiatry

## 2021-02-16 DIAGNOSIS — F330 Major depressive disorder, recurrent, mild: Secondary | ICD-10-CM

## 2021-02-18 ENCOUNTER — Telehealth (HOSPITAL_COMMUNITY): Payer: Self-pay | Admitting: *Deleted

## 2021-02-18 DIAGNOSIS — F900 Attention-deficit hyperactivity disorder, predominantly inattentive type: Secondary | ICD-10-CM

## 2021-02-18 MED ORDER — VYVANSE 40 MG PO CHEW: 40.0000 mg | CHEWABLE_TABLET | ORAL | 0 refills | Status: DC

## 2021-02-18 NOTE — Telephone Encounter (Signed)
Done

## 2021-02-18 NOTE — Telephone Encounter (Signed)
Pt called requesting a refill of Vyvanse 40 mg. Last fill 01/20/21. Pt has an appointment on 03/08/21. Please review.

## 2021-03-08 ENCOUNTER — Telehealth (HOSPITAL_BASED_OUTPATIENT_CLINIC_OR_DEPARTMENT_OTHER): Payer: BC Managed Care – PPO | Admitting: Psychiatry

## 2021-03-08 ENCOUNTER — Other Ambulatory Visit: Payer: Self-pay

## 2021-03-08 ENCOUNTER — Encounter (HOSPITAL_COMMUNITY): Payer: Self-pay | Admitting: Psychiatry

## 2021-03-08 DIAGNOSIS — F900 Attention-deficit hyperactivity disorder, predominantly inattentive type: Secondary | ICD-10-CM | POA: Diagnosis not present

## 2021-03-08 DIAGNOSIS — F330 Major depressive disorder, recurrent, mild: Secondary | ICD-10-CM | POA: Diagnosis not present

## 2021-03-08 DIAGNOSIS — F419 Anxiety disorder, unspecified: Secondary | ICD-10-CM | POA: Diagnosis not present

## 2021-03-08 MED ORDER — SERTRALINE HCL 100 MG PO TABS: 150.0000 mg | ORAL_TABLET | Freq: Every day | ORAL | 0 refills | Status: DC

## 2021-03-08 MED ORDER — TRAZODONE HCL 100 MG PO TABS: ORAL_TABLET | ORAL | 0 refills | Status: DC

## 2021-03-08 NOTE — Progress Notes (Signed)
Virtual Visit via Telephone Note  I connected with Ann Santos on 03/08/21 at  4:00 PM EST by telephone and verified that I am speaking with the correct person using two identifiers.  Location: Patient: In Car Provider: Home Office   I discussed the limitations, risks, security and privacy concerns of performing an evaluation and management service by telephone and the availability of in person appointments. I also discussed with the patient that there may be a patient responsible charge related to this service. The patient expressed understanding and agreed to proceed.   History of Present Illness: Patient is evaluated by phone session.  Currently she is taking her father to the hospital for preop.  Patient told father may need cardiac procedure tomorrow morning.  Overall she feels things are going well.  Since she started exercise she is not having any panic attack or anxiety attack.  She lost more than 15 pounds in past few months as she feels more relaxed.  He is also in therapy with Harrie Foreman once a week.  She is also an couple therapy with her husband.  She feels her marriage is going very well and there has been no recent issues.  Her husband is very supportive.  She reported her job is sometimes challenging but manageable.  She is able to do multitasking and attention concentration and focus is good.  She sleeps good with the help of trazodone and there are nights when she need to take 200 mg otherwise she takes 150 mg at bedtime.  Patient denies any crying spells or any feeling of hopelessness or worthlessness.  She has no tremor or shakes or any EPS.  She denies drinking or using any illegal substances.   Past Psychiatric History:  Started antidepressant in 2009 when husband going through drug addiction. Tried Celexa, Wellbutrin, Prozac and Strattera.  Had a good response with Zoloft and trazodone. No H/O inpatient treatment, mania, psychosis, hallucination or any suicidal  attempt.   Psychiatric Specialty Exam: Physical Exam  Review of Systems  Weight 198 lb (89.8 kg).There is no height or weight on file to calculate BMI.  General Appearance: NA  Eye Contact:  NA  Speech:  Clear and Coherent  Volume:  Normal  Mood:  Euthymic  Affect:  NA  Thought Process:  Goal Directed  Orientation:  Full (Time, Place, and Person)  Thought Content:  WDL  Suicidal Thoughts:  No  Homicidal Thoughts:  No  Memory:  Immediate;   Good Recent;   Good Remote;   Good  Judgement:  Intact  Insight:  Present  Psychomotor Activity:  NA  Concentration:  Concentration: Good and Attention Span: Good  Recall:  Good  Fund of Knowledge:  Good  Language:  Good  Akathisia:  No  Handed:  Right  AIMS (if indicated):     Assets:  Communication Skills Desire for Improvement Housing Resilience Social Support Talents/Skills Transportation  ADL's:  Intact  Cognition:  WNL  Sleep:   Okay      Assessment and Plan: Attention deficit disorder, inattentive type.  Major depressive disorder, recurrent.  Anxiety.  Patient is stable on her current medication.  She started exercise and she had lost more than 15 pounds in past few months.  She has no tremor or shakes or any EPS.  Continue Vyvanse 40 mg in the morning to help her attention focus and multitasking, trazodone 150-200 mg at bedtime and Zoloft 150 mg daily.  Recommended to call us back if  there is any question or any concern.  Follow-up in 3 months.  Follow Up Instructions:    I discussed the assessment and treatment plan with the patient. The patient was provided an opportunity to ask questions and all were answered. The patient agreed with the plan and demonstrated an understanding of the instructions.   The patient was advised to call back or seek an in-person evaluation if the symptoms worsen or if the condition fails to improve as anticipated.  I provided 23 minutes of non-face-to-face time during this  encounter.   Cleotis Nipper, MD

## 2021-03-11 ENCOUNTER — Telehealth (HOSPITAL_COMMUNITY): Payer: Self-pay | Admitting: *Deleted

## 2021-03-11 ENCOUNTER — Other Ambulatory Visit (HOSPITAL_COMMUNITY): Payer: Self-pay | Admitting: *Deleted

## 2021-03-11 DIAGNOSIS — F900 Attention-deficit hyperactivity disorder, predominantly inattentive type: Secondary | ICD-10-CM | POA: Diagnosis not present

## 2021-03-11 DIAGNOSIS — F411 Generalized anxiety disorder: Secondary | ICD-10-CM | POA: Diagnosis not present

## 2021-03-11 DIAGNOSIS — Z79891 Long term (current) use of opiate analgesic: Secondary | ICD-10-CM | POA: Diagnosis not present

## 2021-03-11 NOTE — Telephone Encounter (Signed)
Pt called office stating that she has found another doctor who will be managing her medications and said that we can cancel the scripts for last visit on 03/08/21.

## 2021-03-11 NOTE — Telephone Encounter (Signed)
Will let donna know.

## 2021-03-11 NOTE — Telephone Encounter (Signed)
Ok. We need to send letter to close the case.

## 2021-03-12 ENCOUNTER — Encounter (HOSPITAL_COMMUNITY): Payer: Self-pay | Admitting: Psychiatry

## 2021-04-07 ENCOUNTER — Telehealth: Payer: BC Managed Care – PPO | Admitting: Physician Assistant

## 2021-04-07 DIAGNOSIS — K5792 Diverticulitis of intestine, part unspecified, without perforation or abscess without bleeding: Secondary | ICD-10-CM | POA: Diagnosis not present

## 2021-04-07 MED ORDER — ONDANSETRON 4 MG PO TBDP: 4.0000 mg | ORAL_TABLET | Freq: Three times a day (TID) | ORAL | 0 refills | Status: DC | PRN

## 2021-04-07 MED ORDER — CIPROFLOXACIN HCL 500 MG PO TABS: 500.0000 mg | ORAL_TABLET | Freq: Two times a day (BID) | ORAL | 0 refills | Status: DC

## 2021-04-07 MED ORDER — METRONIDAZOLE 500 MG PO TABS: 500.0000 mg | ORAL_TABLET | Freq: Three times a day (TID) | ORAL | 0 refills | Status: AC

## 2021-04-07 NOTE — Progress Notes (Signed)
Virtual Visit Consent   Ann Santos, you are scheduled for a virtual visit with a [facility] provider today.     Just as with appointments in the office, your consent must be obtained to participate.  Your consent will be active for this visit and any virtual visit you may have with one of our providers in the next 365 days.     If you have a MyChart account, a copy of this consent can be sent to you electronically.  All virtual visits are billed to your insurance company just like a traditional visit in the office.    As this is a virtual visit, video technology does not allow for your provider to perform a traditional examination.  This may limit your provider's ability to fully assess your condition.  If your provider identifies any concerns that need to be evaluated in person or the need to arrange testing (such as labs, EKG, etc.), we will make arrangements to do so.     Although advances in technology are sophisticated, we cannot ensure that it will always work on either your end or our end.  If the connection with a video visit is poor, the visit may have to be switched to a telephone visit.  With either a video or telephone visit, we are not always able to ensure that we have a secure connection.     I need to obtain your verbal consent now.   Are you willing to proceed with your visit today?    Chimamanda R Reinitz has provided verbal consent on 04/07/2021 for a virtual visit (video or telephone).   Piedad Climes, [place]   Date: 04/07/2021 8:52 AM   Virtual Visit via Video Note   I, Piedad Climes, connected with  Ann Santos  ([id number], 1980-10-25) on 04/07/21 at  8:45 AM EST by a video-enabled telemedicine application and verified that I am speaking with the correct person using two identifiers.  Location: Patient: Virtual Visit Location Patient: Home Provider: Virtual Visit Location Provider: Home Office   I discussed the limitations of evaluation and management by  telemedicine and the availability of in person appointments. The patient expressed understanding and agreed to proceed.    History of Present Illness: Ann Santos is a 40 y.o. who identifies as a female who was assigned female at birth, and is being seen today for possible flare of diverticulitis. Notes yesterday starting with bloating, urgent to defecate without any any increased output. Noting LLQ pain with nausea and anorexia. Denies vomiting. Denies fever, chills. Denies melena, hematochezia. Has a Solicitor, Dr. Elnoria Howard, but was unable to get in with them today. Has taken Tylenol OTC to help with pain. Also started some probiotics and a stool softener last night.     HPI: HPI  Problems:  Patient Active Problem List   Diagnosis Date Noted   History of DVT (deep vein thrombosis) 08/29/2019   On continuous oral anticoagulation 08/29/2019   Morbid obesity (HCC) 11/17/2014   Diverticular disease 10/23/2012   Umbilical hernia 10/26/2011    Allergies: No Known Allergies Medications:  Current Outpatient Medications:    acetaminophen (TYLENOL) 325 MG tablet, Take 650 mg by mouth every 6 (six) hours as needed for headache., Disp: , Rfl:    CALCIUM PO, Take 1 tablet daily by mouth., Disp: , Rfl:    cetirizine (ZYRTEC) 10 MG tablet, Take by mouth., Disp: , Rfl:    Cyanocobalamin 1000 MCG SUBL, Place under the  tongue., Disp: , Rfl:    LYSINE PO, Take 1 tablet by mouth daily., Disp: , Rfl:    Multiple Vitamins-Minerals (WOMENS MULTIVITAMIN) TABS, Take 1 tablet by mouth daily., Disp: , Rfl:    norgestrel-ethinyl estradiol (LO/OVRAL) 0.3-30 MG-MCG tablet, Low-Ogestrel (28) 0.3 mg-30 mcg tablet, Disp: , Rfl:    Probiotic Product (PROBIOTIC-10 PO), Probiotic, Disp: , Rfl:    SAXENDA 18 MG/3ML SOPN, Inject 3 mg into the skin daily., Disp: , Rfl:    valACYclovir (VALTREX) 500 MG tablet, Take 1 tablet (500 mg total) by mouth 2 (two) times daily., Disp: 20 tablet, Rfl:  5  Observations/Objective: Patient is well-developed, well-nourished in no acute distress.  Resting comfortably at work.  Head is normocephalic, atraumatic.  No labored breathing. Speech is clear and coherent with logical content.  Patient is alert and oriented at baseline.   Assessment and Plan: 1. Diverticulitis Suspected diverticulitis giving history and classic symptoms for her. No fever or other alarm signs/symptoms. Will treat empirically with Cipro and Flagyl as she has tolerated well in the past. Zofran per orders. OTC pain relievers discussed. She was made aware we do not prescribe Rx pain medication so if anything is worsening despite treatment she will need in-person evaluation with her Gastro provider or at UC/ER.  Follow Up Instructions: I discussed the assessment and treatment plan with the patient. The patient was provided an opportunity to ask questions and all were answered. The patient agreed with the plan and demonstrated an understanding of the instructions.  A copy of instructions were sent to the patient via MyChart unless otherwise noted below.   The patient was advised to call back or seek an in-person evaluation if the symptoms worsen or if the condition fails to improve as anticipated.  Time:  I spent 15 minutes with the patient via telehealth technology discussing the above problems/concerns.    Piedad Climes, PA-C

## 2021-04-07 NOTE — Patient Instructions (Addendum)
Ann Santos, thank you for joining Piedad Climes, PA-C for today's virtual visit.  While this provider is not your primary care provider (PCP), if your PCP is located in our provider database this encounter information will be shared with them immediately following your visit.  Consent: (Patient) Ann Santos provided verbal consent for this virtual visit at the beginning of the encounter.  Current Medications:  Current Outpatient Medications:    ciprofloxacin (CIPRO) 500 MG tablet, Take 1 tablet (500 mg total) by mouth 2 (two) times daily., Disp: 14 tablet, Rfl: 0   metroNIDAZOLE (FLAGYL) 500 MG tablet, Take 1 tablet (500 mg total) by mouth 3 (three) times daily for 7 days., Disp: 21 tablet, Rfl: 0   ondansetron (ZOFRAN-ODT) 4 MG disintegrating tablet, Take 1 tablet (4 mg total) by mouth every 8 (eight) hours as needed for nausea or vomiting., Disp: 20 tablet, Rfl: 0   acetaminophen (TYLENOL) 325 MG tablet, Take 650 mg by mouth every 6 (six) hours as needed for headache., Disp: , Rfl:    CALCIUM PO, Take 1 tablet daily by mouth., Disp: , Rfl:    cetirizine (ZYRTEC) 10 MG tablet, Take by mouth., Disp: , Rfl:    Cyanocobalamin 1000 MCG SUBL, Place under the tongue., Disp: , Rfl:    LYSINE PO, Take 1 tablet by mouth daily., Disp: , Rfl:    Multiple Vitamins-Minerals (WOMENS MULTIVITAMIN) TABS, Take 1 tablet by mouth daily., Disp: , Rfl:    norgestrel-ethinyl estradiol (LO/OVRAL) 0.3-30 MG-MCG tablet, Low-Ogestrel (28) 0.3 mg-30 mcg tablet, Disp: , Rfl:    Probiotic Product (PROBIOTIC-10 PO), Probiotic, Disp: , Rfl:    SAXENDA 18 MG/3ML SOPN, Inject 3 mg into the skin daily., Disp: , Rfl:    valACYclovir (VALTREX) 500 MG tablet, Take 1 tablet (500 mg total) by mouth 2 (two) times daily., Disp: 20 tablet, Rfl: 5   Medications ordered in this encounter:  Meds ordered this encounter  Medications   ondansetron (ZOFRAN-ODT) 4 MG disintegrating tablet    Sig: Take 1 tablet (4 mg total)  by mouth every 8 (eight) hours as needed for nausea or vomiting.    Dispense:  20 tablet    Refill:  0    Order Specific Question:   Supervising Provider    Answer:   Hyacinth Meeker, BRIAN [[id number]]   ciprofloxacin (CIPRO) 500 MG tablet    Sig: Take 1 tablet (500 mg total) by mouth 2 (two) times daily.    Dispense:  14 tablet    Refill:  0    Order Specific Question:   Supervising Provider    Answer:   MILLER, BRIAN [[id number]]   metroNIDAZOLE (FLAGYL) 500 MG tablet    Sig: Take 1 tablet (500 mg total) by mouth 3 (three) times daily for 7 days.    Dispense:  21 tablet    Refill:  0    Order Specific Question:   Supervising Provider    Answer:   Hyacinth Meeker, BRIAN [[id number]]     *If you need refills on other medications prior to your next appointment, please contact your pharmacy*  Follow-Up: Call back or seek an in-person evaluation if the symptoms worsen or if the condition fails to improve as anticipated.  Other Instructions Please keep well-hydrated and get plenty of rest.  Take the antibiotics as directed. Use the Zofran as directed, when needed for nausea.  Follow previous recommendations for this from your Gastroenterologist. If symptoms are not improving or you note new or  worsening symptoms, you will need an in-person evaluation ASAP. Do not delay care!   If you have been instructed to have an in-person evaluation today at a local Urgent Care facility, please use the link below. It will take you to a list of all of our available [facility], including address, phone number and hours of operation. Please do not delay care.  [facility]  If you or a family member do not have a primary care provider, use the link below to schedule a visit and establish care. When you choose a [facility] primary care physician or advanced practice provider, you gain a long-term partner in health. Find a Primary Care Provider  Learn more about [facility]'s in-office and virtual care  options: [facility] - Get Care Now

## 2021-04-08 DIAGNOSIS — F900 Attention-deficit hyperactivity disorder, predominantly inattentive type: Secondary | ICD-10-CM | POA: Diagnosis not present

## 2021-04-08 DIAGNOSIS — F411 Generalized anxiety disorder: Secondary | ICD-10-CM | POA: Diagnosis not present

## 2021-04-14 ENCOUNTER — Encounter (HOSPITAL_BASED_OUTPATIENT_CLINIC_OR_DEPARTMENT_OTHER): Payer: Self-pay

## 2021-04-14 ENCOUNTER — Emergency Department (HOSPITAL_BASED_OUTPATIENT_CLINIC_OR_DEPARTMENT_OTHER): Payer: BC Managed Care – PPO

## 2021-04-14 ENCOUNTER — Emergency Department (HOSPITAL_BASED_OUTPATIENT_CLINIC_OR_DEPARTMENT_OTHER)
Admission: EM | Admit: 2021-04-14 | Discharge: 2021-04-14 | Disposition: A | Discharge status: Home / Self Care | Payer: BC Managed Care – PPO | Attending: Emergency Medicine | Admitting: Emergency Medicine

## 2021-04-14 ENCOUNTER — Other Ambulatory Visit: Payer: Self-pay

## 2021-04-14 ENCOUNTER — Ambulatory Visit
Admission: RE | Admit: 2021-04-14 | Discharge: 2021-04-14 | Disposition: A | Discharge status: Home / Self Care | Payer: BC Managed Care – PPO | Source: Ambulatory Visit

## 2021-04-14 VITALS — BP 137/92 | HR 78 | Temp 98.7°F | Resp 18

## 2021-04-14 DIAGNOSIS — K5732 Diverticulitis of large intestine without perforation or abscess without bleeding: Secondary | ICD-10-CM | POA: Insufficient documentation

## 2021-04-14 DIAGNOSIS — K5792 Diverticulitis of intestine, part unspecified, without perforation or abscess without bleeding: Secondary | ICD-10-CM | POA: Diagnosis not present

## 2021-04-14 DIAGNOSIS — I10 Essential (primary) hypertension: Secondary | ICD-10-CM | POA: Insufficient documentation

## 2021-04-14 DIAGNOSIS — R1110 Vomiting, unspecified: Secondary | ICD-10-CM | POA: Diagnosis not present

## 2021-04-14 DIAGNOSIS — R1032 Left lower quadrant pain: Secondary | ICD-10-CM | POA: Diagnosis not present

## 2021-04-14 LAB — POCT URINALYSIS DIP (MANUAL ENTRY)
Bilirubin, UA: NEGATIVE
Blood, UA: NEGATIVE
Glucose, UA: NEGATIVE mg/dL
Ketones, POC UA: NEGATIVE mg/dL
Nitrite, UA: NEGATIVE
Protein Ur, POC: NEGATIVE mg/dL
Spec Grav, UA: >=1.03 — AB (ref 1.010–1.025)
Urobilinogen, UA: 0.2 E.U./dL
pH, UA: 6 (ref 5.0–8.0)

## 2021-04-14 LAB — URINALYSIS, ROUTINE W REFLEX MICROSCOPIC
Bilirubin Urine: NEGATIVE
Glucose, UA: NEGATIVE mg/dL
Hgb urine dipstick: NEGATIVE
Nitrite: NEGATIVE
Protein, ur: NEGATIVE mg/dL
Specific Gravity, Urine: 1.026 (ref 1.005–1.030)
pH: 5.5 (ref 5.0–8.0)

## 2021-04-14 LAB — COMPREHENSIVE METABOLIC PANEL
ALT: 17 U/L (ref 0–44)
AST: 22 U/L (ref 15–41)
Albumin: 3.8 g/dL (ref 3.5–5.0)
Alkaline Phosphatase: 48 U/L (ref 38–126)
Anion gap: 7 (ref 5–15)
BUN: 9 mg/dL (ref 6–20)
CO2: 27 mmol/L (ref 22–32)
Calcium: 9 mg/dL (ref 8.9–10.3)
Chloride: 105 mmol/L (ref 98–111)
Creatinine, Ser: 0.62 mg/dL (ref 0.44–1.00)
GFR, Estimated: >60 mL/min (ref >60)
Glucose, Bld: 114 mg/dL — ABNORMAL HIGH (ref 70–99)
Potassium: 3.7 mmol/L (ref 3.5–5.1)
Sodium: 139 mmol/L (ref 135–145)
Total Bilirubin: 0.3 mg/dL (ref 0.3–1.2)
Total Protein: 6.8 g/dL (ref 6.5–8.1)

## 2021-04-14 LAB — LIPASE, BLOOD: Lipase: 38 U/L (ref 11–51)

## 2021-04-14 LAB — CBC
HCT: 44.5 % (ref 36.0–46.0)
Hemoglobin: 14.5 g/dL (ref 12.0–15.0)
MCH: 30 pg (ref 26.0–34.0)
MCHC: 32.6 g/dL (ref 30.0–36.0)
MCV: 92.1 fL (ref 80.0–100.0)
Platelets: 386 10*3/uL (ref 150–400)
RBC: 4.83 MIL/uL (ref 3.87–5.11)
RDW: 11.9 % (ref 11.5–15.5)
WBC: 7.1 10*3/uL (ref 4.0–10.5)
nRBC: 0 % (ref 0.0–0.2)

## 2021-04-14 LAB — POCT URINE PREGNANCY: Preg Test, Ur: NEGATIVE

## 2021-04-14 LAB — PREGNANCY, URINE: Preg Test, Ur: NEGATIVE

## 2021-04-14 MED ORDER — SODIUM CHLORIDE 0.9 % IV BOLUS
1000.0000 mL | Freq: Once | INTRAVENOUS | Status: AC
  Administered 2021-04-14: 11:00:00 1000 mL via INTRAVENOUS

## 2021-04-14 MED ORDER — ONDANSETRON 4 MG PO TBDP
4.0000 mg | ORAL_TABLET | Freq: Three times a day (TID) | ORAL | 0 refills | Status: DC | PRN
Start: 2021-04-14 — End: 2021-06-28

## 2021-04-14 MED ORDER — OXYCODONE-ACETAMINOPHEN 5-325 MG PO TABS: 1.0000 | ORAL_TABLET | Freq: Four times a day (QID) | ORAL | 0 refills | Status: DC | PRN

## 2021-04-14 MED ORDER — AMOXICILLIN-POT CLAVULANATE 875-125 MG PO TABS: 1.0000 | ORAL_TABLET | Freq: Two times a day (BID) | ORAL | 0 refills | Status: AC

## 2021-04-14 MED ORDER — FENTANYL CITRATE PF 50 MCG/ML IJ SOSY
50.0000 ug | PREFILLED_SYRINGE | Freq: Once | INTRAMUSCULAR | Status: AC
  Administered 2021-04-14: 11:00:00 50 ug via INTRAVENOUS
  Filled 2021-04-14: qty 1

## 2021-04-14 MED ORDER — ONDANSETRON HCL 4 MG/2ML IJ SOLN
4.0000 mg | Freq: Once | INTRAMUSCULAR | Status: DC | PRN
  Filled 2021-04-14: qty 2

## 2021-04-14 MED ORDER — IOHEXOL 300 MG/ML  SOLN
100.0000 mL | Freq: Once | INTRAMUSCULAR | Status: AC | PRN
  Administered 2021-04-14: 12:00:00 100 mL via INTRAVENOUS

## 2021-04-14 NOTE — ED Triage Notes (Addendum)
8 days ago - stated she was starting with diverticulitis flare-up; did video visit and given Cipro, metronidazole, Zofran - was unable to get Rx for the hydrocodone. Over past few days has had severe nausea, diarrhea, LLQ pain. States has been unable to take the meds (except Zofran) over past 2 days due to severe nausea. Had an old leftover hydrocodone, which has helped the pain, but feels like she's not getting better.

## 2021-04-14 NOTE — Discharge Instructions (Signed)
We believe your symptoms are caused by diverticulitis.  Most of the time this condition (please read through the included information) can be cured with outpatient antibiotics.  Please take the full course of prescribed medication(s) and follow up with the doctors recommended above.  Return to the ED if your abdominal pain worsens or fails to improve, you develop bloody vomiting, bloody diarrhea, you are unable to tolerate fluids due to vomiting, fever greater than 101, or other symptoms that concern you.  Take Percocet as prescribed for severe pain. Do not drink alcohol, drive or participate in any other potentially dangerous activities while taking this medication as it may make you sleepy. Do not take this medication with any other sedating medications, either prescription or over-the-counter. If you were prescribed Percocet or Vicodin, do not take these with acetaminophen (Tylenol) as it is already contained within these medications.   This medication is an opiate (or narcotic) pain medication and can be habit forming.  Use it as little as possible to achieve adequate pain control.  Do not use or use it with extreme caution if you have a history of opiate abuse or dependence.  This medication is intended for your use only - do not give any to anyone else and keep it in a secure place where nobody else, especially children, have access to it.  It will also cause or worsen constipation, so you may want to consider taking an over-the-counter stool softener while you are taking this medication.      

## 2021-04-14 NOTE — ED Notes (Signed)
Patient is being discharged from the Urgent Care and sent to the Emergency Department via POA. Per L. Romero Liner, patient is in need of higher level of care due to need for further evaluation. Patient is aware and verbalizes understanding of plan of care.  Vitals:   04/14/21 0932  BP: (!) 137/92  Pulse: 78  Resp: 18  Temp: 98.7 F (37.1 C)  SpO2: 98%

## 2021-04-14 NOTE — ED Provider Notes (Signed)
Emergency Department Provider Note   I have reviewed the triage vital signs and the nursing notes.   HISTORY  Chief Complaint Abdominal Pain and Nausea   HPI Ann Santos is a 40 y.o. female with past medical history of diverticulitis presents to the emergency department with worsening abdominal pain after 1 week of Cipro/Flagyl.  Patient states that she began experiencing some left lower quadrant abdominal pain which reminded her of prior diverticulitis flares.  She had a telehealth visit with the provider who called in Cipro and Flagyl which she is been taking for the past 6 days.  She states initially the pain seemed to improve but in the past 24 hours symptoms have worsened significantly.  She has had a lot of nausea and vomiting with worsening abdominal pain.  Denies any fever but symptoms became severe to the point where she presented initially to urgent care.  After evaluation there, she was referred to the emergency department for further evaluation and consideration of imaging.  Denies any dysuria, hesitancy, urgency.  She is established with a gastroenterologist.    Past Medical History:  Diagnosis Date   Abdominal distention    Abdominal pain    Anxiety    Blood in stool    Constipation    Diverticulitis    Per pt. States she never had CT scan to confirm this   Diverticulosis    PT H[facility]PITALIZED AT [facility] October 03, 2011   Gestational hypertension 07/05/2013   Gestational hypertension 07/05/2013   History of chicken pox    childhood   History of hiatal hernia    Hypertension    During pregnancy only   Maternal obesity, antepartum 07/05/2013   Maternal obesity, antepartum 07/05/2013   Numbness    first two fingers right hand    Rectal bleeding    Shortness of breath dyspnea    with exercise   Umbilical hernia    planning surgical repair    Patient Active Problem List   Diagnosis Date Noted   History of DVT (deep vein thrombosis) 08/29/2019   On continuous oral  anticoagulation 08/29/2019   Morbid obesity (HCC) 11/17/2014   Diverticular disease 10/23/2012   Umbilical hernia 10/26/2011    Past Surgical History:  Procedure Laterality Date   ABDOMINOPLASTY     w/ hernia revision 2020 @ [facility]   BREATH TEK H PYLORI N/A 07/22/2014   Procedure: BREATH TEK H PYLORI;  Surgeon: Ovidio Kin, MD;  Location: [facility];  Service: General;  Laterality: N/A;   CESAREAN SECTION  05/28/2009   CESAREAN SECTION N/A 07/05/2013   Procedure: REPEAT CESAREAN SECTION (With Special Wound Vac);  Surgeon: Robley Fries, MD;  Location: [facility];  Service: Obstetrics;  Laterality: N/A;   EDD: 07/23/13   COLONOSCOPY  10/07/2011   Procedure: COLONOSCOPY;  Surgeon: Theda Belfast, MD;  Location: [facility];  Service: Endoscopy;  Laterality: N/A;   ESOPHAGOGASTRODUODENOSCOPY  10/07/2011   Procedure: ESOPHAGOGASTRODUODENOSCOPY (EGD);  Surgeon: Theda Belfast, MD;  Location: [facility];  Service: Endoscopy;  Laterality: N/A;   HERNIA REPAIR     LAPAROSCOPIC GASTRIC SLEEVE RESECTION WITH HIATAL HERNIA REPAIR N/A 11/17/2014   Procedure: LAPAROSCOPIC GASTRIC SLEEVE RESECTION ;  Surgeon: Ovidio Kin, MD;  Location: [facility];  Service: General;  Laterality: N/A;   SCAR REVISION  09/17/2009   c-section scar revision   UMBILICAL HERNIA REPAIR  11/01/2011   Procedure: HERNIA REPAIR UMBILICAL ADULT;  Surgeon: Velora Heckler, MD;  Location:  [facility];  Service: General;  Laterality: N/A;    Allergies Patient has no known allergies.  Family History  Problem Relation Age of Onset   Diabetes Mother    Hypertension Mother    Ulcerative colitis Father    Hypertension Father    Cancer Maternal Grandmother        lung    Social History Social History   Tobacco Use   Smoking status: Never   Smokeless tobacco: Never  Vaping Use   Vaping Use: Never used  Substance Use Topics   Alcohol use: Yes    Comment: occasionally   Drug use: No    Review of Systems  Constitutional:  No fever/chills Eyes: No visual changes. ENT: No sore throat. Cardiovascular: Denies chest pain. Respiratory: Denies shortness of breath. Gastrointestinal: Positive abdominal pain. Positive nausea and vomiting.  No diarrhea.  No constipation. Genitourinary: Negative for dysuria. Musculoskeletal: Negative for back pain. Skin: Negative for rash. Neurological: Negative for headaches, focal weakness or numbness.  10-point ROS otherwise negative.  ____________________________________________   PHYSICAL EXAM:  VITAL SIGNS: ED Triage Vitals  Enc Vitals Group     BP 04/14/21 1040 (!) 149/94     Pulse Rate 04/14/21 1040 79     Resp 04/14/21 1040 16     Temp 04/14/21 1040 99 F (37.2 C)     Temp Source 04/14/21 1040 Oral     SpO2 04/14/21 1040 100 %     Weight 04/14/21 1040 214 lb (97.1 kg)     Height 04/14/21 1040 5\' 2"  (1.575 m)   Constitutional: Alert and oriented. Well appearing and in no acute distress. Eyes: Conjunctivae are normal.  Head: Atraumatic. Nose: No congestion/rhinnorhea. Mouth/Throat: Mucous membranes are moist.  Neck: No stridor.   Cardiovascular: Normal rate, regular rhythm. Good peripheral circulation. Grossly normal heart sounds.   Respiratory: Normal respiratory effort.  No retractions. Lungs CTAB. Gastrointestinal: Soft with tenderness throughout the abdomen although not specifically peritoneal.  No focal masses or area of focal peritonitis. no distention.  Musculoskeletal: No lower extremity tenderness nor edema. No gross deformities of extremities. Neurologic:  Normal speech and language. No gross focal neurologic deficits are appreciated.  Skin:  Skin is warm, dry and intact. No rash noted.  ____________________________________________   LABS (all labs ordered are listed, but only abnormal results are displayed)  Labs Reviewed  COMPREHENSIVE METABOLIC PANEL - Abnormal; Notable for the following components:      Result Value   Glucose, Bld 114 (*)     All other components within normal limits  URINALYSIS, ROUTINE W REFLEX MICROSCOPIC - Abnormal; Notable for the following components:   Ketones, ur TRACE (*)    Leukocytes,Ua TRACE (*)    Bacteria, UA RARE (*)    All other components within normal limits  LIPASE, BLOOD  CBC  PREGNANCY, URINE   ____________________________________________  RADIOLOGY  CT ABDOMEN PELVIS W CONTRAST  Result Date: 04/14/2021 CLINICAL DATA:  40 year old female with history of diverticulitis. Nausea and vomiting and LEFT lower quadrant pain. EXAM: CT ABDOMEN AND PELVIS WITH CONTRAST TECHNIQUE: Multidetector CT imaging of the abdomen and pelvis was performed using the standard protocol following bolus administration of intravenous contrast. CONTRAST:  128mL OMNIPAQUE IOHEXOL 300 MG/ML  SOLN COMPARISON:  CT 05/12/2018 FINDINGS: Lower chest: Lung bases are clear. Hepatobiliary: Focal fatty infiltration along the falciform ligament. Fatty infiltration along the gallbladder fossa. No enhancing hepatic lesion identified. No biliary duct dilatation. Gallbladder normal. Common bile duct normal. Pancreas: Pancreas  is normal. No ductal dilatation. No pancreatic inflammation. Spleen: Normal spleen Adrenals/urinary tract: Adrenal glands and kidneys are normal. The ureters and bladder normal. Stomach/Bowel: Post bariatric surgery with gastric sleeve anatomy. Stomach is normal otherwise. The duodenum and small-bowel normal. Normal appendix. Several diverticula ascending colon and transverse colon. Multiple diverticula of the descending colon. There is mild pericolonic inflammation and small amount of fluid in the LEFT pericolic gutter just below the splenic flexure of the descending colon (image 40/2). No perforation or abscess. Sigmoid colon rectum normal. Vascular/Lymphatic: Abdominal aorta normal caliber. No abdominopelvic lymphadenopathy. Prominent venous collaterals in the presacral space (image 69/series 2) Reproductive: IUD in  expected location.  History uterus Other: No free fluid. Musculoskeletal: No aggressive osseous lesion. IMPRESSION: 1. Mild non complicated acute diverticulitis of the proximal descending colon. No perforation or abscess. 2. Additional scattered diverticula throughout the colon. 3. Post gastric bariatric surgery without complication. 4. Mild venous collateralization in the presacral space. Electronically Signed   By: Suzy Bouchard M.D.   On: 04/14/2021 12:32    ____________________________________________   PROCEDURES  Procedure(s) performed:   Procedures  None  ____________________________________________   INITIAL IMPRESSION / ASSESSMENT AND PLAN / ED COURSE  Pertinent labs & imaging results that were available during my care of the patient were reviewed by me and considered in my medical decision making (see chart for details).   Patient presents emergency department for evaluation of abdominal pain and nausea/vomiting.  Has been on Cipro/Flagyl treating empirically for diverticulitis flare starting 1 week ago.  Differential does include diverticulitis with consideration of possible complication such as microperforation versus abscess.  Also need to evaluate for possible hernia, bowel obstruction, gallbladder pathology.  Plan for labs and CT imaging here with reassessment.   Patient CT imaging shows acute noncomplicated diverticulitis.  Plan to transition patient to Augmentin for antibiotic coverage.  There is no perforation or abscess.  Patient's lab work is unremarkable with no leukocytosis.  No vital sign abnormalities to suggest sepsis.  Patient is tolerating fluids here.  Plan for discharge home with 10 days of Augmentin and plan to follow with both PCP and GI moving forward.  ____________________________________________  FINAL CLINICAL IMPRESSION(S) / ED DIAGNOSES  Final diagnoses:  Diverticulitis     MEDICATIONS GIVEN DURING THIS VISIT:  Medications  ondansetron  (ZOFRAN) injection 4 mg (has no administration in time range)  sodium chloride 0.9 % bolus 1,000 mL (1,000 mLs Intravenous New Bag/Given 04/14/21 1120)  fentaNYL (SUBLIMAZE) injection 50 mcg (50 mcg Intravenous Given 04/14/21 1118)  iohexol (OMNIPAQUE) 300 MG/ML solution 100 mL (100 mLs Intravenous Contrast Given 04/14/21 1209)     NEW OUTPATIENT MEDICATIONS STARTED DURING THIS VISIT:  Discharge Medication List as of 04/14/2021 12:48 PM     START taking these medications   Details  amoxicillin-clavulanate (AUGMENTIN) 875-125 MG tablet Take 1 tablet by mouth every 12 (twelve) hours for 10 days., Starting Wed 04/14/2021, Until Sat 04/24/2021, Normal    oxyCODONE-acetaminophen (PERCOCET/ROXICET) 5-325 MG tablet Take 1 tablet by mouth every 6 (six) hours as needed for severe pain., Starting Wed 04/14/2021, Normal        Note:  This document was prepared using Dragon voice recognition software and may include unintentional dictation errors.  Nanda Quinton, MD, [facility] Emergency Medicine    Draylen Lobue, [facility], MD 04/14/21 [phone]

## 2021-04-14 NOTE — ED Notes (Signed)
Dc instructions reviewed with patient. Patient voiced understanding. Dc with belongings.  °

## 2021-04-14 NOTE — ED Provider Notes (Signed)
[facility]    CSN: [id number] Arrival date & time: 04/14/21  3536    HISTORY   Chief Complaint  Patient presents with   Abdominal Pain   Nausea   HPI Ann Santos is a 40 y.o. female. Patient complains that 8 days ago she fell like she was having a flareup of her known diverticulitis.  Patient states she had a video visit with her provider and was prescribed ciprofloxacin metronidazole and Zofran.  Patient states she was unable to get a prescription for hydrocodone.  Patient states over the past few days she has had severe nausea, diarrhea and left lower quadrant pain.  Patient states she has been unable to take any of the antibiotics due to the severity of her nausea but adds that she has been able to take the Zofran, which apparently is not providing any relief.  Patient states she had some old tablets of hydrocodone from September 2021 which has helped her pain to some extent but she states, again, that she does not feel that she is getting any better.  EMR reviewed, patient has CT abdomen in 2020 which demonstrated diverticulitis.  Patient states she had an episode since then but did not have imaging at that time.  Given patient's acute onset of symptoms, lack of improvement with antibiotic usage and persistent nausea at this point, I have advised her to go to the ED for CT scan of her abdomen due to my not having the authority to order CT scan for her myself.  Patient was very agreeable to going to the emergency room now, states she plans to go to the [facility] location.  The history is provided by the patient.  Past Medical History:  Diagnosis Date   Abdominal distention    Abdominal pain    Anxiety    Blood in stool    Constipation    Diverticulitis    Per pt. States she never had CT scan to confirm this   Diverticulosis    PT HOSPITALIZED AT [facility] October 03, 2011   Gestational hypertension 07/05/2013   Gestational hypertension 07/05/2013   History of  chicken pox    childhood   History of hiatal hernia    Hypertension    During pregnancy only   Maternal obesity, antepartum 07/05/2013   Maternal obesity, antepartum 07/05/2013   Numbness    first two fingers right hand    Rectal bleeding    Shortness of breath dyspnea    with exercise   Umbilical hernia    planning surgical repair   Patient Active Problem List   Diagnosis Date Noted   History of DVT (deep vein thrombosis) 08/29/2019   On continuous oral anticoagulation 08/29/2019   Morbid obesity (HCC) 11/17/2014   Diverticular disease 10/23/2012   Umbilical hernia 10/26/2011   Past Surgical History:  Procedure Laterality Date   ABDOMINOPLASTY     w/ hernia revision 2020 @ [facility]   BREATH TEK H PYLORI N/A 07/22/2014   Procedure: BREATH TEK H PYLORI;  Surgeon: Ovidio Kin, MD;  Location: [facility];  Service: General;  Laterality: N/A;   CESAREAN SECTION  05/28/2009   CESAREAN SECTION N/A 07/05/2013   Procedure: REPEAT CESAREAN SECTION (With Special Wound Vac);  Surgeon: Robley Fries, MD;  Location: [facility];  Service: Obstetrics;  Laterality: N/A;   EDD: 07/23/13   COLONOSCOPY  10/07/2011   Procedure: COLONOSCOPY;  Surgeon: Theda Belfast, MD;  Location: [facility];  Service: Endoscopy;  Laterality: N/A;   ESOPHAGOGASTRODUODENOSCOPY  10/07/2011   Procedure: ESOPHAGOGASTRODUODENOSCOPY (EGD);  Surgeon: Theda Belfast, MD;  Location: [facility];  Service: Endoscopy;  Laterality: N/A;   HERNIA REPAIR     LAPAROSCOPIC GASTRIC SLEEVE RESECTION WITH HIATAL HERNIA REPAIR N/A 11/17/2014   Procedure: LAPAROSCOPIC GASTRIC SLEEVE RESECTION ;  Surgeon: Ovidio Kin, MD;  Location: [facility];  Service: General;  Laterality: N/A;   SCAR REVISION  09/17/2009   c-section scar revision   UMBILICAL HERNIA REPAIR  11/01/2011   Procedure: HERNIA REPAIR UMBILICAL ADULT;  Surgeon: Velora Heckler, MD;  Location: [facility];  Service: General;  Laterality: N/A;   OB History     Gravida  2   Para   2   Term  2   Preterm      AB      Living  2      SAB      IAB      Ectopic      Multiple      Live Births  2          Home Medications    Prior to Admission medications   Medication Sig Start Date End Date Taking? Authorizing Provider  acetaminophen (TYLENOL) 325 MG tablet Take 650 mg by mouth every 6 (six) hours as needed for headache.   Yes [provider]  CALCIUM PO Take 1 tablet daily by mouth.   Yes [provider]  ciprofloxacin (CIPRO) 500 MG tablet Take 1 tablet (500 mg total) by mouth 2 (two) times daily. 04/07/21  Yes Waldon Merl, PA-C  Cyanocobalamin 1000 MCG SUBL Place under the tongue.   Yes [provider]  Lisdexamfetamine Dimesylate (VYVANSE PO) Take by mouth.   Yes [provider]  LYSINE PO Take 1 tablet by mouth daily.   Yes [provider]  metroNIDAZOLE (FLAGYL) 500 MG tablet Take 1 tablet (500 mg total) by mouth 3 (three) times daily for 7 days. 04/07/21 04/14/21 Yes Waldon Merl, PA-C  Multiple Vitamins-Minerals (WOMENS MULTIVITAMIN) TABS Take 1 tablet by mouth daily.   Yes [provider]  ondansetron (ZOFRAN-ODT) 4 MG disintegrating tablet Take 1 tablet (4 mg total) by mouth every 8 (eight) hours as needed for nausea or vomiting. 04/07/21  Yes Waldon Merl, PA-C  Probiotic Product (PROBIOTIC-10 PO) Probiotic   Yes [provider]  TRAZODONE HCL PO Take by mouth.   Yes [provider]  valACYclovir (VALTREX) 500 MG tablet Take 1 tablet (500 mg total) by mouth 2 (two) times daily. 06/22/20   Georgina Quint, MD     This office note has been dictated using Dragon speech recognition software.  Unfortunately, and despite my best efforts, this method of dictation can sometimes lead to occasional typographical or grammatical errors.  I apologize in advance if this occurs.     Theadora Rama Scales, PA-C 04/14/21 1022

## 2021-04-14 NOTE — ED Triage Notes (Signed)
Pt had a virtual appointment last Wednesday for a diverticulitis falre up. Prescribed Flagyl, Cipro and Zofran. Yesterday began feeling dizzy and nauseous having to take Zofran "like popcorn". Unable to keep anything down. Went to [facility] and they sent her here for a CT of her abd d/t symptoms not resolved with medication

## 2021-04-15 ENCOUNTER — Encounter: Payer: Self-pay | Admitting: Emergency Medicine

## 2021-04-16 MED ORDER — FLUCONAZOLE 150 MG PO TABS: ORAL_TABLET | ORAL | 1 refills | Status: DC

## 2021-04-16 NOTE — Telephone Encounter (Signed)
Ok done erx 

## 2021-06-07 ENCOUNTER — Telehealth (HOSPITAL_COMMUNITY): Payer: BC Managed Care – PPO | Admitting: Psychiatry

## 2021-06-28 ENCOUNTER — Other Ambulatory Visit: Payer: Self-pay

## 2021-06-28 ENCOUNTER — Ambulatory Visit
Admission: RE | Admit: 2021-06-28 | Discharge: 2021-06-28 | Disposition: A | Discharge status: Home / Self Care | Payer: BC Managed Care – PPO | Source: Ambulatory Visit

## 2021-06-28 VITALS — BP 142/83 | HR 90 | Temp 98.5°F | Resp 18

## 2021-06-28 DIAGNOSIS — M62838 Other muscle spasm: Secondary | ICD-10-CM | POA: Diagnosis not present

## 2021-06-28 DIAGNOSIS — K029 Dental caries, unspecified: Secondary | ICD-10-CM

## 2021-06-28 DIAGNOSIS — K046 Periapical abscess with sinus: Secondary | ICD-10-CM | POA: Diagnosis not present

## 2021-06-28 MED ORDER — BACLOFEN 5 MG PO TABS: 10.0000 mg | ORAL_TABLET | Freq: Three times a day (TID) | ORAL | 0 refills | Status: AC

## 2021-06-28 MED ORDER — FLUCONAZOLE 150 MG PO TABS: ORAL_TABLET | ORAL | 0 refills | Status: DC

## 2021-06-28 MED ORDER — NAPROXEN 500 MG PO TABS: 500.0000 mg | ORAL_TABLET | Freq: Two times a day (BID) | ORAL | 0 refills | Status: DC

## 2021-06-28 MED ORDER — PENICILLIN V POTASSIUM 500 MG PO TABS: 500.0000 mg | ORAL_TABLET | Freq: Three times a day (TID) | ORAL | 0 refills | Status: AC

## 2021-06-28 MED ORDER — KETOROLAC TROMETHAMINE 60 MG/2ML IM SOLN
60.0000 mg | Freq: Once | INTRAMUSCULAR | Status: AC
  Administered 2021-06-28: 60 mg via INTRAMUSCULAR

## 2021-06-28 NOTE — ED Triage Notes (Signed)
Since Friday having headache and neck pains. 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 Pt reports tried massage guns and pain pill over weekend without relief. Reports painful and not able to turn head due to pain.  ?

## 2021-06-28 NOTE — Discharge Instructions (Addendum)
Please begin penicillin 1 tablet 3 times daily for the next 14 days to resolve the infection around your bottom left tooth.  I provided you with a prescription for Diflucan for the inevitable vaginal yeast infection that you are going to develop after taking antibiotics for this long. ? ?You received an injection of ketorolac to acutely reduce the pain that you are having in your neck right now, I provided you with a prescription for naproxen 500 mg twice daily as needed and baclofen 5 mg 3 times daily as needed.  If you would like to double the baclofen once you have tried the lower dose, please feel free to do so. ? ?If you would like to return to our clinic in a few days for repeat ketorolac injection, please also feel free to do that as well. ? ?Thank you for visiting urgent care today.  I promise that you do not have meningitis.  I have also provided you with a note to be "out of work" for the next few days. ?

## 2021-06-28 NOTE — ED Provider Notes (Signed)
[facility]    CSN: [id number] Arrival date & time: 06/28/21  [id number]    HISTORY   Chief Complaint  Patient presents with   Headache   Neck Pain   appt 930   HPI Ann Santos is a 41 y.o. female. Since Friday having headache and neck pains. 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 Pt reports tried massage guns and pain pill over weekend without relief. Reports painful and not able to turn head due to pain.  Patient also complains of a cavity on the bottom left molar, states she is noted a pocket of pus beneath that particular tooth along with redness and swelling of her gums.  Patient denies fever, aches, chills, nausea, vomiting, diarrhea.  The history is provided by the patient.  Past Medical History:  Diagnosis Date   Abdominal distention    Abdominal pain    Anxiety    Blood in stool    Constipation    Diverticulitis    Per pt. States she never had CT scan to confirm this   Diverticulosis    PT HOSPITALIZED AT [facility] October 03, 2011   Gestational hypertension 07/05/2013   Gestational hypertension 07/05/2013   History of chicken pox    childhood   History of hiatal hernia    Hypertension    During pregnancy only   Maternal obesity, antepartum 07/05/2013   Maternal obesity, antepartum 07/05/2013   Numbness    first two fingers right hand    Rectal bleeding    Shortness of breath dyspnea    with exercise   Umbilical hernia    planning surgical repair   Patient Active Problem List   Diagnosis Date Noted   History of DVT (deep vein thrombosis) 08/29/2019   On continuous oral anticoagulation 08/29/2019   Morbid obesity ([place]) 11/17/2014   Diverticular disease 99991111   Umbilical hernia [id number]   Past Surgical History:  Procedure Laterality Date   ABDOMINOPLASTY     w/ hernia revision 2020 @ [place] N/A 07/22/2014   Procedure: [place];  Surgeon: Alphonsa Overall, MD;   Location: [facility];  Service: General;  Laterality: N/A;   CESAREAN SECTION  05/28/2009   CESAREAN SECTION N/A 07/05/2013   Procedure: REPEAT CESAREAN SECTION (With Special Wound Vac);  Surgeon: Elveria Royals, MD;  Location: [facility];  Service: Obstetrics;  Laterality: N/A;   EDD: 07/23/13   COLONOSCOPY  10/07/2011   Procedure: COLONOSCOPY;  Surgeon: Beryle Beams, MD;  Location: [facility];  Service: Endoscopy;  Laterality: N/A;   ESOPHAGOGASTRODUODENOSCOPY  10/07/2011   Procedure: ESOPHAGOGASTRODUODENOSCOPY (EGD);  Surgeon: Beryle Beams, MD;  Location: [facility];  Service: Endoscopy;  Laterality: N/A;   HERNIA REPAIR     LAPAROSCOPIC GASTRIC SLEEVE RESECTION WITH HIATAL HERNIA REPAIR N/A 11/17/2014   Procedure: LAPAROSCOPIC GASTRIC SLEEVE RESECTION ;  Surgeon: Alphonsa Overall, MD;  Location: [facility];  Service: General;  Laterality: N/A;   SCAR REVISION  09/17/2009   c-section scar revision   UMBILICAL HERNIA REPAIR  11/01/2011   Procedure: HERNIA REPAIR UMBILICAL ADULT;  Surgeon: Earnstine Regal, MD;  Location: [facility];  Service: General;  Laterality: N/A;   OB History     Gravida  2   Para  2   Term  2   Preterm      AB      Living  2  SAB      IAB      Ectopic      Multiple      Live Births  2          Home Medications    Prior to Admission medications   Medication Sig Start Date End Date Taking? Authorizing Provider  acetaminophen (TYLENOL) 325 MG tablet Take 650 mg by mouth every 6 (six) hours as needed for headache.    [provider]  CALCIUM PO Take 1 tablet daily by mouth.    [provider]  Cyanocobalamin 1000 MCG SUBL Place under the tongue.    [provider]  Lisdexamfetamine Dimesylate (VYVANSE PO) Take by mouth.    [provider]  LYSINE PO Take 1 tablet by mouth daily.    [provider]  Multiple Vitamins-Minerals (WOMENS MULTIVITAMIN) TABS Take 1 tablet by mouth daily.    [provider]  Probiotic Product (PROBIOTIC-10 PO) Probiotic    [provider]  sertraline (ZOLOFT) 100 MG tablet Take 200 mg by mouth daily. 06/11/21   [provider]  TRAZODONE HCL PO Take by mouth.    [provider]  VYVANSE 50 MG CHEW Chew 1 tablet by mouth every morning. 06/16/21   [provider]    Family History Family History  Problem Relation Age of Onset   Diabetes Mother    Hypertension Mother    Ulcerative colitis Father    Hypertension Father    Cancer Maternal Grandmother        lung   Social History Social History   Tobacco Use   Smoking status: Never   Smokeless tobacco: Never  Vaping Use   Vaping Use: Never used  Substance Use Topics   Alcohol use: Yes    Comment: occasionally   Drug use: No   Allergies   Patient has no known allergies.  Review of Systems Review of Systems Pertinent findings noted in history of present illness.   Physical Exam Triage Vital Signs ED Triage Vitals  Enc Vitals Group     BP 02/19/21 0827 (!) 147/82     Pulse Rate 02/19/21 0827 72     Resp 02/19/21 0827 18     Temp 02/19/21 0827 98.3 F (36.8 C)     Temp Source 02/19/21 0827 Oral     SpO2 02/19/21 0827 98 %     Weight --      Height --      Head Circumference --      Peak Flow --      Pain Score 02/19/21 0826 5     Pain Loc --      Pain Edu? --      Excl. in [place]? --   No data found.  Updated Vital Signs BP (!) 142/83 (BP Location: Left Arm)    Pulse 90    Temp 98.5 F (36.9 C) (Oral)    Resp 18    SpO2 97%   Physical Exam Vitals and nursing note reviewed.  Constitutional:      General: She is not in acute distress.    Appearance: Normal appearance. She is not ill-appearing.  HENT:     Head: Normocephalic and atraumatic.     Salivary Glands: Right salivary gland is not diffusely enlarged or tender. Left salivary gland is not diffusely enlarged or tender.     Right Ear: Tympanic membrane, ear canal and external ear  normal. No drainage. No middle ear  effusion. There is no impacted cerumen. Tympanic membrane is not erythematous or bulging.     Left Ear: Tympanic membrane, ear canal and external ear normal. No drainage.  No middle ear effusion. There is no impacted cerumen. Tympanic membrane is not erythematous or bulging.     Nose: Nose normal. No nasal deformity, septal deviation, mucosal edema, congestion or rhinorrhea.     Right Turbinates: Not enlarged, swollen or pale.     Left Turbinates: Not enlarged, swollen or pale.     Right Sinus: No maxillary sinus tenderness or frontal sinus tenderness.     Left Sinus: No maxillary sinus tenderness or frontal sinus tenderness.     Mouth/Throat:     Lips: Pink. No lesions.     Mouth: Mucous membranes are moist. No oral lesions.     Pharynx: Oropharynx is clear. Uvula midline. No posterior oropharyngeal erythema or uvula swelling.     Tonsils: No tonsillar exudate. 0 on the right. 0 on the left.   Eyes:     General: Lids are normal.        Right eye: No discharge.        Left eye: No discharge.     Extraocular Movements: Extraocular movements intact.     Conjunctiva/sclera: Conjunctivae normal.     Right eye: Right conjunctiva is not injected.     Left eye: Left conjunctiva is not injected.  Neck:     Trachea: Trachea and phonation normal.  Cardiovascular:     Rate and Rhythm: Normal rate and regular rhythm.     Pulses: Normal pulses.     Heart sounds: Normal heart sounds. No murmur heard.   No friction rub. No gallop.  Pulmonary:     Effort: Pulmonary effort is normal. No accessory muscle usage, prolonged expiration or respiratory distress.     Breath sounds: Normal breath sounds. No stridor, decreased air movement or transmitted upper airway sounds. No decreased breath sounds, wheezing, rhonchi or rales.  Chest:     Chest wall: No tenderness.  Musculoskeletal:        General: Tenderness (Tenderness and spasm of cervical paraspinal muscles on the  left) present. Normal range of motion.     Cervical back: Normal range of motion and neck supple. Normal range of motion.  Lymphadenopathy:     Cervical: No cervical adenopathy.  Skin:    General: Skin is warm and dry.     Findings: No erythema or rash.  Neurological:     General: No focal deficit present.     Mental Status: She is alert and oriented to person, place, and time.  Psychiatric:        Mood and Affect: Mood normal.        Behavior: Behavior normal.    Visual Acuity Right Eye Distance:   Left Eye Distance:   Bilateral Distance:    Right Eye Near:   Left Eye Near:    Bilateral Near:     UC Couse / Diagnostics / Procedures:    EKG  Radiology No results found.  Procedures Procedures (including critical care time)  UC Diagnoses / Final Clinical Impressions(s)   I have reviewed the triage vital signs and the nursing notes.  Pertinent labs & imaging results that were available during my care of the patient were reviewed by me and considered in my medical decision making (see chart for details).    Final diagnoses:  Periapical abscess of tooth with fistula  Dental caries into  pulp  Cervical paraspinous muscle spasm   Patient provided with a prescription of penicillin for dental infection and baclofen muscle relaxer/naproxen for musculoskeletal pain.  Return precautions advised.  ED Prescriptions     Medication Sig Dispense Auth. Provider   penicillin v potassium (VEETID) 500 MG tablet Take 1 tablet (500 mg total) by mouth 3 (three) times daily for 14 days. 42 tablet Lynden Oxford Scales, PA-C   baclofen 5 MG TABS Take 10 mg by mouth 3 (three) times daily for 7 days. 21 tablet Lynden Oxford Scales, PA-C   naproxen (NAPROSYN) 500 MG tablet Take 1 tablet (500 mg total) by mouth 2 (two) times daily. 30 tablet Lynden Oxford Scales, PA-C   fluconazole (DIFLUCAN) 150 MG tablet Take 1 tablet on day 4 of antibiotics.  Take second tablet 3 days later. 2 tablet  Lynden Oxford Scales, PA-C      PDMP not reviewed this encounter.  Pending results:  Labs Reviewed - No data to display  Medications Ordered in UC: Medications  ketorolac (TORADOL) injection 60 mg (has no administration in time range)    Disposition Upon Discharge:  Condition: stable for discharge home Home: take medications as prescribed; routine discharge instructions as discussed; follow up as advised.  Patient presented with an acute illness with associated systemic symptoms and significant discomfort requiring urgent management. In my opinion, this is a condition that a prudent lay person (someone who possesses an average knowledge of health and medicine) may potentially expect to result in complications if not addressed urgently such as respiratory distress, impairment of bodily function or dysfunction of bodily organs.   Routine symptom specific, illness specific and/or disease specific instructions were discussed with the patient and/or caregiver at length.   As such, the patient has been evaluated and assessed, work-up was performed and treatment was provided in alignment with urgent care protocols and evidence based medicine.  Patient/parent/caregiver has been advised that the patient may require follow up for further testing and treatment if the symptoms continue in spite of treatment, as clinically indicated and appropriate.  If the patient was tested for COVID-19, Influenza and/or RSV, then the patient/parent/guardian was advised to isolate at home pending the results of his/her diagnostic coronavirus test and potentially longer if theyre positive. I have also advised pt that if his/her COVID-19 test returns positive, it's recommended to self-isolate for at least 10 days after symptoms first appeared AND until fever-free for 24 hours without fever reducer AND other symptoms have improved or resolved. Discussed self-isolation recommendations as well as instructions for  household member/close contacts as per the [facility] and [place] DHHS, and also gave patient the [place] packet with this information.  Patient/parent/caregiver has been advised to return to the [facility] or PCP in 3-5 days if no better; to PCP or the Emergency Department if new signs and symptoms develop, or if the current signs or symptoms continue to change or worsen for further workup, evaluation and treatment as clinically indicated and appropriate  The patient will follow up with their current PCP if and as advised. If the patient does not currently have a PCP we will assist them in obtaining one.   The patient may need specialty follow up if the symptoms continue, in spite of conservative treatment and management, for further workup, evaluation, consultation and treatment as clinically indicated and appropriate.   Patient/parent/caregiver verbalized understanding and agreement of plan as discussed.  All questions were addressed during visit.  Please see discharge instructions below for further  details of plan.  Discharge Instructions:   Discharge Instructions      Please begin penicillin 1 tablet 3 times daily for the next 14 days to resolve the infection around your bottom left tooth.  I provided you with a prescription for Diflucan for the inevitable vaginal yeast infection that you are going to develop after taking antibiotics for this long.  You received an injection of ketorolac to acutely reduce the pain that you are having in your neck right now, I provided you with a prescription for naproxen 500 mg twice daily as needed and baclofen 5 mg 3 times daily as needed.  If you would like to double the baclofen once you have tried the lower dose, please feel free to do so.  If you would like to return to our clinic in a few days for repeat ketorolac injection, please also feel free to do that as well.  Thank you for visiting urgent care today.  I promise that you do not have meningitis.  I have also  provided you with a note to be "out of work" for the next few days.    This office note has been dictated using Museum/gallery curator.  Unfortunately, and despite my best efforts, this method of dictation can sometimes lead to occasional typographical or grammatical errors.  I apologize in advance if this occurs.     Lynden Oxford Scales, PA-C 06/28/21 1344

## 2021-07-06 DIAGNOSIS — F411 Generalized anxiety disorder: Secondary | ICD-10-CM | POA: Diagnosis not present

## 2021-07-06 DIAGNOSIS — F900 Attention-deficit hyperactivity disorder, predominantly inattentive type: Secondary | ICD-10-CM | POA: Diagnosis not present

## 2021-08-09 ENCOUNTER — Other Ambulatory Visit (HOSPITAL_COMMUNITY): Payer: Self-pay | Admitting: Psychiatry

## 2021-08-09 DIAGNOSIS — F330 Major depressive disorder, recurrent, mild: Secondary | ICD-10-CM

## 2021-08-18 ENCOUNTER — Telehealth: Payer: Self-pay

## 2021-08-18 ENCOUNTER — Other Ambulatory Visit: Payer: Self-pay | Admitting: Emergency Medicine

## 2021-08-18 MED ORDER — VALACYCLOVIR HCL 1 G PO TABS: 1000.0000 mg | ORAL_TABLET | Freq: Two times a day (BID) | ORAL | 2 refills | Status: AC

## 2021-08-18 NOTE — Telephone Encounter (Signed)
Pt is requesting a refill on:  ?valACYclovir (VALTREX) 500 MG tablet (Discontinued) ? ?PHARMACY: ?[facility] [address] [place], [place] - [address] ? ?LOV 08/29/19 ?ROV 08/24/21 ? ?Pt is asking for an significant amount she states that they keep coming back ?

## 2021-08-18 NOTE — Telephone Encounter (Signed)
Prescription sent to pharmacy of record.  Thanks.

## 2021-08-24 ENCOUNTER — Ambulatory Visit: Payer: BC Managed Care – PPO | Admitting: Emergency Medicine

## 2021-08-24 ENCOUNTER — Encounter: Payer: Self-pay | Admitting: Emergency Medicine

## 2021-08-24 LAB — LIPID PANEL
Cholesterol: 208 mg/dL — ABNORMAL HIGH (ref 0–200)
HDL: 64.5 mg/dL (ref >39.00)
LDL Cholesterol: 127 mg/dL — ABNORMAL HIGH (ref 0–99)
NonHDL: 143.78
Total CHOL/HDL Ratio: 3
Triglycerides: 84 mg/dL (ref 0.0–149.0)
VLDL: 16.8 mg/dL (ref 0.0–40.0)

## 2021-08-24 LAB — CBC WITH DIFFERENTIAL/PLATELET
Basophils Absolute: 0.1 10*3/uL (ref 0.0–0.1)
Basophils Relative: 0.7 % (ref 0.0–3.0)
Eosinophils Absolute: 0.1 10*3/uL (ref 0.0–0.7)
Eosinophils Relative: 0.9 % (ref 0.0–5.0)
HCT: 41.6 % (ref 36.0–46.0)
Hemoglobin: 14.3 g/dL (ref 12.0–15.0)
Lymphocytes Relative: 28.5 % (ref 12.0–46.0)
Lymphs Abs: 2.4 10*3/uL (ref 0.7–4.0)
MCHC: 34.5 g/dL (ref 30.0–36.0)
MCV: 91.1 fl (ref 78.0–100.0)
Monocytes Absolute: 0.5 10*3/uL (ref 0.1–1.0)
Monocytes Relative: 6.1 % (ref 3.0–12.0)
Neutro Abs: 5.3 10*3/uL (ref 1.4–7.7)
Neutrophils Relative %: 63.8 % (ref 43.0–77.0)
Platelets: 345 10*3/uL (ref 150.0–400.0)
RBC: 4.57 Mil/uL (ref 3.87–5.11)
RDW: 12.3 % (ref 11.5–15.5)
WBC: 8.3 10*3/uL (ref 4.0–10.5)

## 2021-08-24 LAB — COMPREHENSIVE METABOLIC PANEL
ALT: 21 U/L (ref 0–35)
AST: 22 U/L (ref 0–37)
Albumin: 4.3 g/dL (ref 3.5–5.2)
Alkaline Phosphatase: 54 U/L (ref 39–117)
BUN: 11 mg/dL (ref 6–23)
CO2: 29 mEq/L (ref 19–32)
Calcium: 9.6 mg/dL (ref 8.4–10.5)
Chloride: 102 mEq/L (ref 96–112)
Creatinine, Ser: 0.87 mg/dL (ref 0.40–1.20)
GFR: 83.06 mL/min (ref >60.00)
Glucose, Bld: 90 mg/dL (ref 70–99)
Potassium: 3.6 mEq/L (ref 3.5–5.1)
Sodium: 137 mEq/L (ref 135–145)
Total Bilirubin: 0.4 mg/dL (ref 0.2–1.2)
Total Protein: 7.5 g/dL (ref 6.0–8.3)

## 2021-08-24 LAB — TSH: TSH: 1.68 u[IU]/mL (ref 0.35–5.50)

## 2021-08-24 MED ORDER — SAXENDA 18 MG/3ML ~~LOC~~ SOPN: 3.0000 mg | PEN_INJECTOR | Freq: Every day | SUBCUTANEOUS | 7 refills | Status: DC

## 2021-08-24 NOTE — Assessment & Plan Note (Signed)
Recommend to continue Saxenda 3 mg daily. ?Diet and nutrition discussed. ?Blood work done today. ?

## 2021-08-24 NOTE — Patient Instructions (Signed)

## 2021-08-24 NOTE — Progress Notes (Signed)
Media R Rehberg ?41 y.o. ? ? ?Chief Complaint  ?Patient presents with  ? Follow-up  ? ? ?HISTORY OF PRESENT ILLNESS: ?This is a 41 y.o. female here for follow-up of chronic medical problems ?Last office visit with me over a year ago. ?History of morbid obesity.  Used to go to weight management clinic with [facility]. ?Take Saxenda.  Needs refill if possible. ?No other complaints or medical concerns today. ? ?HPI ? ? ?Prior to Admission medications   ?Medication Sig Start Date End Date Taking? Authorizing Provider  ?acetaminophen (TYLENOL) 325 MG tablet Take 650 mg by mouth every 6 (six) hours as needed for headache.    [provider]  ?CALCIUM PO Take 1 tablet daily by mouth.    [provider]  ?Cyanocobalamin 1000 MCG SUBL Place under the tongue.    [provider]  ?fluconazole (DIFLUCAN) 150 MG tablet Take 1 tablet on day 4 of antibiotics.  Take second tablet 3 days later. 06/28/21   Lynden Oxford Scales, PA-C  ?Lisdexamfetamine Dimesylate (VYVANSE PO) Take by mouth.    [provider]  ?LYSINE PO Take 1 tablet by mouth daily.    [provider]  ?Multiple Vitamins-Minerals (WOMENS MULTIVITAMIN) TABS Take 1 tablet by mouth daily.    [provider]  ?naproxen (NAPROSYN) 500 MG tablet Take 1 tablet (500 mg total) by mouth 2 (two) times daily. 06/28/21   Lynden Oxford Scales, PA-C  ?Probiotic Product (PROBIOTIC-10 PO) Probiotic    [provider]  ?sertraline (ZOLOFT) 100 MG tablet Take 200 mg by mouth daily. 06/11/21   [provider]  ?TRAZODONE HCL PO Take by mouth.    [provider]  ?valACYclovir (VALTREX) 1000 MG tablet Take 1 tablet (1,000 mg total) by mouth 2 (two) times daily. 08/18/21 09/17/21  Horald Pollen, MD  ?Alycia Patten 50 MG CHEW Chew 1 tablet by mouth every morning. 06/16/21   [provider]  ? ? ?No Known Allergies ? ?Patient Active Problem List  ? Diagnosis Date Noted  ? History of DVT  (deep vein thrombosis) 08/29/2019  ? On continuous oral anticoagulation 08/29/2019  ? Morbid obesity ([place]) 11/17/2014  ? Diverticular disease 10/23/2012  ? Umbilical hernia [id number]  ? ? ?Past Medical History:  ?Diagnosis Date  ? Abdominal distention   ? Abdominal pain   ? Anxiety   ? Blood in stool   ? Constipation   ? Diverticulitis   ? Per pt. States she never had CT scan to confirm this  ? Diverticulosis   ? PT HOSPITALIZED AT [place] 10, 2013  ? Gestational hypertension 07/05/2013  ? Gestational hypertension 07/05/2013  ? History of chicken pox   ? childhood  ? History of hiatal hernia   ? Hypertension   ? During pregnancy only  ? Maternal obesity, antepartum 07/05/2013  ? Maternal obesity, antepartum 07/05/2013  ? Numbness   ? first two fingers right hand   ? Rectal bleeding   ? Shortness of breath dyspnea   ? with exercise  ? Umbilical hernia   ? planning surgical repair  ? ? ?Past Surgical History:  ?Procedure Laterality Date  ? ABDOMINOPLASTY    ? w/ hernia revision 2020 @ [place]  ? BREATH TEK H PYLORI N/A 07/22/2014  ? Procedure: BREATH TEK H PYLORI;  Surgeon: Alphonsa Overall, MD;  Location: [facility];  Service: General;  Laterality: N/A;  ? CESAREAN SECTION  05/28/2009  ? CESAREAN SECTION N/A 07/05/2013  ?  Procedure: REPEAT CESAREAN SECTION (With Special Wound Vac);  Surgeon: Elveria Royals, MD;  Location: [facility];  Service: Obstetrics;  Laterality: N/A;   EDD: 07/23/13  ? COLONOSCOPY  10/07/2011  ? Procedure: COLONOSCOPY;  Surgeon: Beryle Beams, MD;  Location: [facility];  Service: Endoscopy;  Laterality: N/A;  ? ESOPHAGOGASTRODUODENOSCOPY  10/07/2011  ? Procedure: ESOPHAGOGASTRODUODENOSCOPY (EGD);  Surgeon: Beryle Beams, MD;  Location: [facility];  Service: Endoscopy;  Laterality: N/A;  ? HERNIA REPAIR    ? LAPAROSCOPIC GASTRIC SLEEVE RESECTION WITH HIATAL HERNIA REPAIR N/A 11/17/2014  ? Procedure: LAPAROSCOPIC GASTRIC SLEEVE RESECTION ;  Surgeon: Alphonsa Overall, MD;  Location: [facility];  Service:  General;  Laterality: N/A;  ? SCAR REVISION  09/17/2009  ? c-section scar revision  ? UMBILICAL HERNIA REPAIR  11/01/2011  ? Procedure: HERNIA REPAIR UMBILICAL ADULT;  Surgeon: Earnstine Regal, MD;  Location: [facility];  Service: General;  Laterality: N/A;  ? ? ?Social History  ? ?Socioeconomic History  ? Marital status: Married  ?  Spouse name: Not on file  ? Number of children: 2  ? Years of education: Not on file  ? Highest education level: Not on file  ?Occupational History  ? Not on file  ?Tobacco Use  ? Smoking status: Never  ? Smokeless tobacco: Never  ?Vaping Use  ? Vaping Use: Never used  ?Substance and Sexual Activity  ? Alcohol use: Yes  ?  Comment: occasionally  ? Drug use: No  ? Sexual activity: Yes  ?  Partners: Male  ?  Birth control/protection: I.U.D.  ?Other Topics Concern  ? Not on file  ?Social History Narrative  ? Not on file  ? ?Social Determinants of Health  ? ?Financial Resource Strain: Not on file  ?Food Insecurity: Not on file  ?Transportation Needs: Not on file  ?Physical Activity: Not on file  ?Stress: Not on file  ?Social Connections: Not on file  ?Intimate Partner Violence: Not on file  ? ? ?Family History  ?Problem Relation Age of Onset  ? Diabetes Mother   ? Hypertension Mother   ? Ulcerative colitis Father   ? Hypertension Father   ? Cancer Maternal Grandmother   ?     lung  ? ? ? ?Review of Systems  ?Constitutional: Negative.  Negative for chills and fever.  ?HENT: Negative.  Negative for congestion and sore throat.   ?Respiratory: Negative.  Negative for cough and shortness of breath.   ?Cardiovascular: Negative.  Negative for chest pain and palpitations.  ?Gastrointestinal:  Negative for abdominal pain, nausea and vomiting.  ?Genitourinary: Negative.   ?Skin: Negative.  Negative for rash.  ?Neurological:  Negative for dizziness and headaches.  ?All other systems reviewed and are negative. ? ?Today's Vitals  ? 08/24/21 1541  ?BP: 136/84  ?Pulse: 93  ?Temp: 98.6 ?F (37 ?C)  ?TempSrc: Oral   ?SpO2: 96%  ?Weight: 213 lb 6 oz (96.8 kg)  ?Height: 5\' 2"  (1.575 m)  ? ?Body mass index is 39.03 kg/m?. ? ?Physical Exam ?Constitutional:   ?   Appearance: She is obese.  ?HENT:  ?   Head: Normocephalic.  ?   Mouth/Throat:  ?   Mouth: Mucous membranes are moist.  ?   Pharynx: Oropharynx is clear.  ?Eyes:  ?   Extraocular Movements: Extraocular movements intact.  ?   Pupils: Pupils are equal, round, and reactive to light.  ?Cardiovascular:  ?   Rate and Rhythm: Normal rate and regular rhythm.  ?  Pulses: Normal pulses.  ?   Heart sounds: Normal heart sounds.  ?Pulmonary:  ?   Effort: Pulmonary effort is normal.  ?   Breath sounds: Normal breath sounds.  ?Musculoskeletal:     ?   General: Normal range of motion.  ?   Cervical back: No tenderness.  ?Lymphadenopathy:  ?   Cervical: No cervical adenopathy.  ?Skin: ?   General: Skin is warm and dry.  ?   Capillary Refill: Capillary refill takes less than 2 seconds.  ?Neurological:  ?   General: No focal deficit present.  ?   Mental Status: She is alert and oriented to person, place, and time.  ?Psychiatric:     ?   Mood and Affect: Mood normal.     ?   Behavior: Behavior normal.  ? ? ? ?ASSESSMENT & PLAN: ?Problem List Items Addressed This Visit   ? ?  ? Other  ? Morbid obesity ([place]) - Primary  ?  Recommend to continue Saxenda 3 mg daily. ?Diet and nutrition discussed. ?Blood work done today. ? ?  ?  ? Relevant Medications  ? Liraglutide -Weight Management (SAXENDA) 18 MG/3ML SOPN  ? Other Relevant Orders  ? CBC with Differential/Platelet  ? Comprehensive metabolic panel  ? Hemoglobin A1c  ? Lipid panel  ? TSH  ? ?Patient Instructions  ?Health Maintenance, Female ?Adopting a healthy lifestyle and getting preventive care are important in promoting health and wellness. Ask your health care provider about: ?The right schedule for you to have regular tests and exams. ?Things you can do on your own to prevent diseases and keep yourself healthy. ?What should I know about  diet, weight, and exercise? ?Eat a healthy diet ? ?Eat a diet that includes plenty of vegetables, fruits, low-fat dairy products, and lean protein. ?Do not eat a lot of foods that are high in solid fats, adde

## 2021-08-25 LAB — HEMOGLOBIN A1C: Hgb A1c MFr Bld: 4.8 % (ref 4.6–6.5)

## 2021-09-03 DIAGNOSIS — F411 Generalized anxiety disorder: Secondary | ICD-10-CM | POA: Diagnosis not present

## 2021-09-03 DIAGNOSIS — F900 Attention-deficit hyperactivity disorder, predominantly inattentive type: Secondary | ICD-10-CM | POA: Diagnosis not present

## 2021-09-10 ENCOUNTER — Encounter: Payer: Self-pay | Admitting: *Deleted

## 2021-09-10 ENCOUNTER — Encounter: Payer: Self-pay | Admitting: Emergency Medicine

## 2021-09-11 NOTE — Telephone Encounter (Signed)
Okay to provide work note 

## 2021-09-17 DIAGNOSIS — F411 Generalized anxiety disorder: Secondary | ICD-10-CM | POA: Diagnosis not present

## 2021-09-17 DIAGNOSIS — F900 Attention-deficit hyperactivity disorder, predominantly inattentive type: Secondary | ICD-10-CM | POA: Diagnosis not present

## 2021-09-21 ENCOUNTER — Other Ambulatory Visit: Payer: Self-pay | Admitting: Emergency Medicine

## 2021-09-25 ENCOUNTER — Other Ambulatory Visit: Payer: Self-pay | Admitting: Emergency Medicine

## 2021-10-03 ENCOUNTER — Other Ambulatory Visit: Payer: Self-pay | Admitting: Emergency Medicine

## 2021-11-16 DIAGNOSIS — F900 Attention-deficit hyperactivity disorder, predominantly inattentive type: Secondary | ICD-10-CM | POA: Diagnosis not present

## 2021-11-16 DIAGNOSIS — F411 Generalized anxiety disorder: Secondary | ICD-10-CM | POA: Diagnosis not present

## 2021-12-06 ENCOUNTER — Telehealth: Payer: BC Managed Care – PPO | Admitting: Family Medicine

## 2021-12-06 ENCOUNTER — Other Ambulatory Visit: Payer: Self-pay | Admitting: Emergency Medicine

## 2021-12-06 ENCOUNTER — Encounter: Payer: Self-pay | Admitting: Emergency Medicine

## 2021-12-06 DIAGNOSIS — K5792 Diverticulitis of intestine, part unspecified, without perforation or abscess without bleeding: Secondary | ICD-10-CM

## 2021-12-06 DIAGNOSIS — R112 Nausea with vomiting, unspecified: Secondary | ICD-10-CM | POA: Diagnosis not present

## 2021-12-06 DIAGNOSIS — T3695XA Adverse effect of unspecified systemic antibiotic, initial encounter: Secondary | ICD-10-CM | POA: Diagnosis not present

## 2021-12-06 DIAGNOSIS — B379 Candidiasis, unspecified: Secondary | ICD-10-CM | POA: Diagnosis not present

## 2021-12-06 MED ORDER — ONDANSETRON HCL 4 MG PO TABS: 4.0000 mg | ORAL_TABLET | Freq: Three times a day (TID) | ORAL | 0 refills | Status: DC | PRN

## 2021-12-06 MED ORDER — FLUCONAZOLE 150 MG PO TABS: 150.0000 mg | ORAL_TABLET | ORAL | 0 refills | Status: DC

## 2021-12-06 MED ORDER — AMOXICILLIN-POT CLAVULANATE 875-125 MG PO TABS: 1.0000 | ORAL_TABLET | Freq: Two times a day (BID) | ORAL | 0 refills | Status: DC

## 2021-12-06 MED ORDER — TRAMADOL HCL 50 MG PO TABS: 50.0000 mg | ORAL_TABLET | Freq: Three times a day (TID) | ORAL | 0 refills | Status: AC | PRN

## 2021-12-06 NOTE — Patient Instructions (Signed)
Ann Santos, thank you for joining Ann Finner, NP for today's virtual visit.  While this provider is not your primary care provider (PCP), if your PCP is located in our provider database this encounter information will be shared with them immediately following your visit.  Consent: (Patient) Ann Santos provided verbal consent for this virtual visit at the beginning of the encounter.  Current Medications:  Current Outpatient Medications:    amoxicillin-clavulanate (AUGMENTIN) 875-125 MG tablet, Take 1 tablet by mouth 2 (two) times daily., Disp: 20 tablet, Rfl: 0   fluconazole (DIFLUCAN) 150 MG tablet, Take 1 tablet (150 mg total) by mouth as directed. At end of antibiotics, maybe repeat every 3 days., Disp: 3 tablet, Rfl: 0   ondansetron (ZOFRAN) 4 MG tablet, Take 1 tablet (4 mg total) by mouth every 8 (eight) hours as needed for nausea or vomiting., Disp: 10 tablet, Rfl: 0   acetaminophen (TYLENOL) 325 MG tablet, Take 650 mg by mouth every 6 (six) hours as needed for headache., Disp: , Rfl:    CALCIUM PO, Take 1 tablet daily by mouth., Disp: , Rfl:    Cyanocobalamin 1000 MCG SUBL, Place under the tongue., Disp: , Rfl:    fluconazole (DIFLUCAN) 150 MG tablet, Take 1 tablet on day 4 of antibiotics.  Take second tablet 3 days later. (Patient not taking: Reported on 08/24/2021), Disp: 2 tablet, Rfl: 0   Liraglutide -Weight Management (SAXENDA) 18 MG/3ML SOPN, Inject 3 mg into the skin daily., Disp: 3 mL, Rfl: 7   Lisdexamfetamine Dimesylate (VYVANSE PO), Take by mouth. (Patient not taking: Reported on 08/24/2021), Disp: , Rfl:    LYSINE PO, Take 1 tablet by mouth daily., Disp: , Rfl:    Multiple Vitamins-Minerals (WOMENS MULTIVITAMIN) TABS, Take 1 tablet by mouth daily., Disp: , Rfl:    naproxen (NAPROSYN) 500 MG tablet, Take 1 tablet (500 mg total) by mouth 2 (two) times daily. (Patient not taking: Reported on 08/24/2021), Disp: 30 tablet, Rfl: 0   Probiotic Product (PROBIOTIC-10 PO),  Probiotic, Disp: , Rfl:    sertraline (ZOLOFT) 100 MG tablet, TAKE 2 TABLETS BY MOUTH DAILY, Disp: 180 tablet, Rfl: 1   TRAZODONE HCL PO, Take by mouth., Disp: , Rfl:    valACYclovir (VALTREX) 500 MG tablet, TAKE 1 TABLET BY MOUTH TWICE A DAY, Disp: 20 tablet, Rfl: 5   VYVANSE 50 MG CHEW, Chew 1 tablet by mouth every morning., Disp: , Rfl:    Medications ordered in this encounter:  Meds ordered this encounter  Medications   amoxicillin-clavulanate (AUGMENTIN) 875-125 MG tablet    Sig: Take 1 tablet by mouth 2 (two) times daily.    Dispense:  20 tablet    Refill:  0    Order Specific Question:   Supervising Provider    Answer:   MILLER, BRIAN [[id number]]   fluconazole (DIFLUCAN) 150 MG tablet    Sig: Take 1 tablet (150 mg total) by mouth as directed. At end of antibiotics, maybe repeat every 3 days.    Dispense:  3 tablet    Refill:  0    Order Specific Question:   Supervising Provider    Answer:   MILLER, BRIAN [[id number]]   ondansetron (ZOFRAN) 4 MG tablet    Sig: Take 1 tablet (4 mg total) by mouth every 8 (eight) hours as needed for nausea or vomiting.    Dispense:  10 tablet    Refill:  0    Order Specific Question:   Supervising  Provider    Answer:   Eber Hong [[id number]]     *If you need refills on other medications prior to your next appointment, please contact your pharmacy*  Follow-Up: Call back or seek an in-person evaluation if the symptoms worsen or if the condition fails to improve as anticipated.  Other Instructions Diverticulitis   Diverticulitis is when small pouches in your colon (large intestine) get infected or swollen. This causes pain in the belly (abdomen) and watery poop (diarrhea). These pouches are called diverticula. The pouches form in people who have a condition called diverticulosis. What are the causes? This condition may be caused by poop (stool) that gets trapped in the pouches in your colon. The poop lets germs (bacteria) grow in the pouches. This causes  the infection. What increases the risk? You are more likely to get this condition if you have small pouches in your colon. The risk is higher if: You are overweight or very overweight (obese). You do not exercise enough. You drink alcohol. You smoke or use products with tobacco in them. You eat a diet that has a lot of red meat such as beef, pork, or lamb. You eat a diet that does not have enough fiber in it. You are older than 41 years of age. What are the signs or symptoms? Pain in the belly. Pain is often on the left side, but it may be in other areas. Fever and feeling cold. Feeling like you may vomit. Vomiting. Having cramps. Feeling full. Changes to how often you poop. Blood in your poop. How is this treated? Most cases are treated at home by: Taking over-the-counter pain medicines. Following a clear liquid diet. Taking antibiotic medicines. Resting. Very bad cases may need to be treated at a hospital. This may include: Not eating or drinking. Taking prescription pain medicine. Getting antibiotic medicines through an IV tube. Getting fluid and food through an IV tube. Having surgery. When you are feeling better, your doctor may tell you to have a test to check your colon (colonoscopy). Follow these instructions at home: Medicines Take over-the-counter and prescription medicines only as told by your doctor. These include: Antibiotics. Pain medicines. Fiber pills. Probiotics. Stool softeners. If you were prescribed an antibiotic medicine, take it as told by your doctor. Do not stop taking the antibiotic even if you start to feel better. Ask your doctor if the medicine prescribed to you requires you to avoid driving or using machinery. Eating and drinking  Follow a diet as told by your doctor. When you feel better, your doctor may tell you to change your diet. You may need to eat a lot of fiber. Fiber makes it easier to poop (have a bowel movement). Foods with fiber  include: Berries. Beans. Lentils. Green vegetables. Avoid eating red meat. General instructions Do not use any products that contain nicotine or tobacco, such as cigarettes, e-cigarettes, and chewing tobacco. If you need help quitting, ask your doctor. Exercise 3 or more times a week. Try to get 30 minutes each time. Exercise enough to sweat and make your heart beat faster. Keep all follow-up visits as told by your doctor. This is important. Contact a doctor if: Your pain does not get better. You are not pooping like normal. Get help right away if: Your pain gets worse. Your symptoms do not get better. Your symptoms get worse very fast. You have a fever. You vomit more than one time. You have poop that is: Bloody. Black. Tarry. Summary This condition  happens when small pouches in your colon get infected or swollen. Take medicines only as told by your doctor. Follow a diet as told by your doctor. Keep all follow-up visits as told by your doctor. This is important. This information is not intended to replace advice given to you by your health care provider. Make sure you discuss any questions you have with your health care provider. Document Revised: 01/21/2019 Document Reviewed: 01/21/2019 Elsevier Patient Education  2023 Elsevier Inc.   If you have been instructed to have an in-person evaluation today at a local Urgent Care facility, please use the link below. It will take you to a list of all of our available [facility], including address, phone number and hours of operation. Please do not delay care.  [facility]  If you or a family member do not have a primary care provider, use the link below to schedule a visit and establish care. When you choose a [facility] primary care physician or advanced practice provider, you gain a long-term partner in health. Find a Primary Care Provider  Learn more about [facility]'s in-office and virtual care  options: [facility] - Get Care Now

## 2021-12-06 NOTE — Progress Notes (Signed)
Virtual Visit Consent   Ann Santos, you are scheduled for a virtual visit with a [facility] provider today. Just as with appointments in the office, your consent must be obtained to participate. Your consent will be active for this visit and any virtual visit you may have with one of our providers in the next 365 days. If you have a MyChart account, a copy of this consent can be sent to you electronically.  As this is a virtual visit, video technology does not allow for your provider to perform a traditional examination. This may limit your provider's ability to fully assess your condition. If your provider identifies any concerns that need to be evaluated in person or the need to arrange testing (such as labs, EKG, etc.), we will make arrangements to do so. Although advances in technology are sophisticated, we cannot ensure that it will always work on either your end or our end. If the connection with a video visit is poor, the visit may have to be switched to a telephone visit. With either a video or telephone visit, we are not always able to ensure that we have a secure connection.  By engaging in this virtual visit, you consent to the provision of healthcare and authorize for your insurance to be billed (if applicable) for the services provided during this visit. Depending on your insurance coverage, you may receive a charge related to this service.  I need to obtain your verbal consent now. Are you willing to proceed with your visit today? Ann Santos has provided verbal consent on 12/06/2021 for a virtual visit (video or telephone). [name], NP  Date: 12/06/2021 9:16 AM  Virtual Visit via Video Note   I, [name], connected with  Ann Santos  ([id number], 1980/07/10) on 12/06/21 at  9:15 AM EDT by a video-enabled telemedicine application and verified that I am speaking with the correct person using two identifiers.  Location: Patient: Virtual Visit Location Patient:  Home Provider: Virtual Visit Location Provider: Home Office   I discussed the limitations of evaluation and management by telemedicine and the availability of in person appointments. The patient expressed understanding and agreed to proceed.    History of Present Illness: Ann Santos is a 41 y.o. who identifies as a female who was assigned female at birth, and is being seen today for diverticulitis. Patient states that she began experiencing some left lower quadrant abdominal pain which reminded her of prior diverticulitis flares. Thought was constipation and or cycle starting up.   She has had a lot of nausea with worsening abdominal pain.  Denies any fever that she knows of. Denies any dysuria, hesitancy, urgency. Denies melena, or hematochezia. She is established with a gastroenterologist. Reports that she thinks Augmentin was more helpful, and needs diflucan for yeast due to taking antibiotic.    Problems:  Patient Active Problem List   Diagnosis Date Noted   History of DVT (deep vein thrombosis) 08/29/2019   On continuous oral anticoagulation 08/29/2019   Morbid obesity (HCC) 11/17/2014   Diverticular disease 10/23/2012   Umbilical hernia 10/26/2011    Allergies: No Known Allergies Medications:  Current Outpatient Medications:    acetaminophen (TYLENOL) 325 MG tablet, Take 650 mg by mouth every 6 (six) hours as needed for headache., Disp: , Rfl:    CALCIUM PO, Take 1 tablet daily by mouth., Disp: , Rfl:    Cyanocobalamin 1000 MCG SUBL, Place under the tongue., Disp: , Rfl:  fluconazole (DIFLUCAN) 150 MG tablet, Take 1 tablet on day 4 of antibiotics.  Take second tablet 3 days later. (Patient not taking: Reported on 08/24/2021), Disp: 2 tablet, Rfl: 0   Liraglutide -Weight Management (SAXENDA) 18 MG/3ML SOPN, Inject 3 mg into the skin daily., Disp: 3 mL, Rfl: 7   Lisdexamfetamine Dimesylate (VYVANSE PO), Take by mouth. (Patient not taking: Reported on 08/24/2021), Disp: , Rfl:     LYSINE PO, Take 1 tablet by mouth daily., Disp: , Rfl:    Multiple Vitamins-Minerals (WOMENS MULTIVITAMIN) TABS, Take 1 tablet by mouth daily., Disp: , Rfl:    naproxen (NAPROSYN) 500 MG tablet, Take 1 tablet (500 mg total) by mouth 2 (two) times daily. (Patient not taking: Reported on 08/24/2021), Disp: 30 tablet, Rfl: 0   Probiotic Product (PROBIOTIC-10 PO), Probiotic, Disp: , Rfl:    sertraline (ZOLOFT) 100 MG tablet, TAKE 2 TABLETS BY MOUTH DAILY, Disp: 180 tablet, Rfl: 1   TRAZODONE HCL PO, Take by mouth., Disp: , Rfl:    valACYclovir (VALTREX) 500 MG tablet, TAKE 1 TABLET BY MOUTH TWICE A DAY, Disp: 20 tablet, Rfl: 5   VYVANSE 50 MG CHEW, Chew 1 tablet by mouth every morning., Disp: , Rfl:   Observations/Objective: Patient is well-developed, well-nourished , mildly distress.  Resting at home.  Head is normocephalic, atraumatic.  No labored breathing.  Speech is clear and coherent with logical content.  Patient is alert and oriented at baseline.    Assessment and Plan:  1. Diverticulitis  - amoxicillin-clavulanate (AUGMENTIN) 875-125 MG tablet; Take 1 tablet by mouth 2 (two) times daily.  Dispense: 20 tablet; Refill: 0  2. Antibiotic-induced yeast infection  - fluconazole (DIFLUCAN) 150 MG tablet; Take 1 tablet (150 mg total) by mouth as directed. At end of antibiotics, maybe repeat every 3 days.  Dispense: 3 tablet; Refill: 0  3. Nausea and vomiting, unspecified vomiting type  - ondansetron (ZOFRAN) 4 MG tablet; Take 1 tablet (4 mg total) by mouth every 8 (eight) hours as needed for nausea or vomiting.  Dispense: 10 tablet; Refill: 0  Suspected given symptoms and history diverticulitis. No fever or other alarming signs, is in pain- but knows she will have to contact PCP or be seen in person for pain meds. She reported Cipro and Flagyl are not as helpful as Augmentin to her. Would also appreicate zofran and diflucan for side effects and nausea.   Will follow up with PCP and GI  provider   Patient acknowledged agreement and understanding of the plan.    Reviewed side effects, risks and benefits of medication.    Past Medical, Surgical, Social History, Allergies, and Medications have been Reviewed.   Follow Up Instructions: I discussed the assessment and treatment plan with the patient. The patient was provided an opportunity to ask questions and all were answered. The patient agreed with the plan and demonstrated an understanding of the instructions.  A copy of instructions were sent to the patient via MyChart unless otherwise noted below.     The patient was advised to call back or seek an in-person evaluation if the symptoms worsen or if the condition fails to improve as anticipated.  Time:  I spent 12 minutes with the patient via telehealth technology discussing the above problems/concerns.    [name], NP

## 2021-12-06 NOTE — Telephone Encounter (Signed)
New prescription for tramadol sent to pharmacy of record.

## 2021-12-07 DIAGNOSIS — Z1231 Encounter for screening mammogram for malignant neoplasm of breast: Secondary | ICD-10-CM | POA: Diagnosis not present

## 2021-12-10 ENCOUNTER — Telehealth: Payer: BC Managed Care – PPO | Admitting: Family Medicine

## 2021-12-10 DIAGNOSIS — K5792 Diverticulitis of intestine, part unspecified, without perforation or abscess without bleeding: Secondary | ICD-10-CM

## 2021-12-10 DIAGNOSIS — B379 Candidiasis, unspecified: Secondary | ICD-10-CM | POA: Diagnosis not present

## 2021-12-10 DIAGNOSIS — T3695XA Adverse effect of unspecified systemic antibiotic, initial encounter: Secondary | ICD-10-CM

## 2021-12-10 MED ORDER — METRONIDAZOLE 500 MG PO TABS: 500.0000 mg | ORAL_TABLET | Freq: Two times a day (BID) | ORAL | 0 refills | Status: DC

## 2021-12-10 MED ORDER — FLUCONAZOLE 150 MG PO TABS: 150.0000 mg | ORAL_TABLET | ORAL | 0 refills | Status: DC

## 2021-12-10 MED ORDER — CIPROFLOXACIN HCL 500 MG PO TABS: 500.0000 mg | ORAL_TABLET | Freq: Two times a day (BID) | ORAL | 0 refills | Status: DC

## 2021-12-10 NOTE — Progress Notes (Signed)
Virtual Visit Consent   Shavelle R Lauter, you are scheduled for a virtual visit with a [facility] provider today. Just as with appointments in the office, your consent must be obtained to participate. Your consent will be active for this visit and any virtual visit you may have with one of our providers in the next 365 days. If you have a MyChart account, a copy of this consent can be sent to you electronically.  As this is a virtual visit, video technology does not allow for your provider to perform a traditional examination. This may limit your provider's ability to fully assess your condition. If your provider identifies any concerns that need to be evaluated in person or the need to arrange testing (such as labs, EKG, etc.), we will make arrangements to do so. Although advances in technology are sophisticated, we cannot ensure that it will always work on either your end or our end. If the connection with a video visit is poor, the visit may have to be switched to a telephone visit. With either a video or telephone visit, we are not always able to ensure that we have a secure connection.  By engaging in this virtual visit, you consent to the provision of healthcare and authorize for your insurance to be billed (if applicable) for the services provided during this visit. Depending on your insurance coverage, you may receive a charge related to this service.  I need to obtain your verbal consent now. Are you willing to proceed with your visit today? Ann Santos has provided verbal consent on 12/10/2021 for a virtual visit (video or telephone). Georgana Curio, FNP  Date: 12/10/2021 5:10 PM  Virtual Visit via Video Note   I, Georgana Curio, connected with  Ann Santos  ([id number], Oct 04, 1980) on 12/10/21 at  5:00 PM EDT by a video-enabled telemedicine application and verified that I am speaking with the correct person using two identifiers.  Location: Patient: Virtual Visit Location Patient:  Home Provider: Virtual Visit Location Provider: Home Office   I discussed the limitations of evaluation and management by telemedicine and the availability of in person appointments. The patient expressed understanding and agreed to proceed.    History of Present Illness: Ann Santos is a 41 y.o. who identifies as a female who was assigned female at birth, and is being seen today for flare of diverticulitis. She was started on augmentin Monday and sx persist but are not worse. She says she usually takes the cipro and flagyl first then has to take augmentin so this time she requested augmentin first. She has no fever and still has the same LLQ pain. No bloody diarrhea. She has frequent flares and has discussed surgery with GI. She also requests diflucan. She says she knows when its getting worse and will go to the ED if it worsens or fever develops. Marland Kitchen  HPI: HPI  Problems:  Patient Active Problem List   Diagnosis Date Noted   History of DVT (deep vein thrombosis) 08/29/2019   On continuous oral anticoagulation 08/29/2019   Morbid obesity (HCC) 11/17/2014   Diverticular disease 10/23/2012   Umbilical hernia 10/26/2011    Allergies: No Known Allergies Medications:  Current Outpatient Medications:    ciprofloxacin (CIPRO) 500 MG tablet, Take 1 tablet (500 mg total) by mouth 2 (two) times daily for 10 days. Stop augmentin, Disp: 20 tablet, Rfl: 0   metroNIDAZOLE (FLAGYL) 500 MG tablet, Take 1 tablet (500 mg total) by mouth 2 (two) times  daily for 7 days., Disp: 14 tablet, Rfl: 0   acetaminophen (TYLENOL) 325 MG tablet, Take 650 mg by mouth every 6 (six) hours as needed for headache., Disp: , Rfl:    amoxicillin-clavulanate (AUGMENTIN) 875-125 MG tablet, Take 1 tablet by mouth 2 (two) times daily., Disp: 20 tablet, Rfl: 0   CALCIUM PO, Take 1 tablet daily by mouth., Disp: , Rfl:    Cyanocobalamin 1000 MCG SUBL, Place under the tongue., Disp: , Rfl:    fluconazole (DIFLUCAN) 150 MG tablet, Take  1 tablet (150 mg total) by mouth as directed. At end of antibiotics, maybe repeat every 3 days., Disp: 3 tablet, Rfl: 0   Liraglutide -Weight Management (SAXENDA) 18 MG/3ML SOPN, Inject 3 mg into the skin daily., Disp: 3 mL, Rfl: 7   Lisdexamfetamine Dimesylate (VYVANSE PO), Take by mouth. (Patient not taking: Reported on 08/24/2021), Disp: , Rfl:    LYSINE PO, Take 1 tablet by mouth daily., Disp: , Rfl:    Multiple Vitamins-Minerals (WOMENS MULTIVITAMIN) TABS, Take 1 tablet by mouth daily., Disp: , Rfl:    naproxen (NAPROSYN) 500 MG tablet, Take 1 tablet (500 mg total) by mouth 2 (two) times daily. (Patient not taking: Reported on 08/24/2021), Disp: 30 tablet, Rfl: 0   ondansetron (ZOFRAN) 4 MG tablet, Take 1 tablet (4 mg total) by mouth every 8 (eight) hours as needed for nausea or vomiting., Disp: 10 tablet, Rfl: 0   Probiotic Product (PROBIOTIC-10 PO), Probiotic, Disp: , Rfl:    sertraline (ZOLOFT) 100 MG tablet, TAKE 2 TABLETS BY MOUTH DAILY, Disp: 180 tablet, Rfl: 1   traMADol (ULTRAM) 50 MG tablet, Take 1 tablet (50 mg total) by mouth every 8 (eight) hours as needed for up to 5 days., Disp: 15 tablet, Rfl: 0   TRAZODONE HCL PO, Take by mouth., Disp: , Rfl:    valACYclovir (VALTREX) 500 MG tablet, TAKE 1 TABLET BY MOUTH TWICE A DAY, Disp: 20 tablet, Rfl: 5   VYVANSE 50 MG CHEW, Chew 1 tablet by mouth every morning., Disp: , Rfl:   Observations/Objective: Patient is well-developed, well-nourished in no acute distress.  Resting comfortably  at home.  Head is normocephalic, atraumatic.  No labored breathing.  Speech is clear and coherent with logical content.  Patient is alert and oriented at baseline.    Assessment and Plan: 1. Diverticulitis  2. Antibiotic-induced yeast infection - fluconazole (DIFLUCAN) 150 MG tablet; Take 1 tablet (150 mg total) by mouth as directed. At end of antibiotics, maybe repeat every 3 days.  Dispense: 3 tablet; Refill: 0  Increase fluids, continue fiber and  probiotic, med use and side effects discussed. Stop augmentin. ER if sx worsen or fever develops.   Follow Up Instructions: I discussed the assessment and treatment plan with the patient. The patient was provided an opportunity to ask questions and all were answered. The patient agreed with the plan and demonstrated an understanding of the instructions.  A copy of instructions were sent to the patient via MyChart unless otherwise noted below.     The patient was advised to call back or seek an in-person evaluation if the symptoms worsen or if the condition fails to improve as anticipated.  Time:  I spent 10 minutes with the patient via telehealth technology discussing the above problems/concerns.    Georgana Curio, FNP

## 2021-12-10 NOTE — Patient Instructions (Signed)
Diverticulitis  Diverticulitis is infection or inflammation of small pouches (diverticula) in the colon that form due to a condition called diverticulosis. Diverticula can trap stool (feces) and bacteria, causing infection and inflammation. Diverticulitis may cause severe stomach pain and diarrhea. It may lead to tissue damage in the colon that causes bleeding or blockage. The diverticula may also burst (rupture) and cause infected stool to enter other areas of the abdomen. What are the causes? This condition is caused by stool becoming trapped in the diverticula, which allows bacteria to grow in the diverticula. This leads to inflammation and infection. What increases the risk? You are more likely to develop this condition if you have diverticulosis. The risk increases if you: Are overweight or obese. Do not get enough exercise. Drink alcohol. Use tobacco products. Eat a diet that has a lot of red meat such as beef, pork, or lamb. Eat a diet that does not include enough fiber. High-fiber foods include fruits, vegetables, beans, nuts, and whole grains. Are over 40 years of age. What are the signs or symptoms? Symptoms of this condition may include: Pain and tenderness in the abdomen. The pain is normally located on the left side of the abdomen, but it may occur in other areas. Fever and chills. Nausea. Vomiting. Cramping. Bloating. Changes in bowel routines. Blood in your stool. How is this diagnosed? This condition is diagnosed based on: Your medical history. A physical exam. Tests to make sure there is nothing else causing your condition. These tests may include: Blood tests. Urine tests. CT scan of the abdomen. How is this treated? Most cases of this condition are mild and can be treated at home. Treatment may include: Taking over-the-counter pain medicines. Following a clear liquid diet. Taking antibiotic medicines by mouth. Resting. More severe cases may need to be treated  at a hospital. Treatment may include: Not eating or drinking. Taking prescription pain medicine. Receiving antibiotic medicines through an IV. Receiving fluids and nutrition through an IV. Surgery. When your condition is under control, your health care provider may recommend that you have a colonoscopy. This is an exam to look at the entire large intestine. During the exam, a lubricated, bendable tube is inserted into the anus and then passed into the rectum, colon, and other parts of the large intestine. A colonoscopy can show how severe your diverticula are and whether something else may be causing your symptoms. Follow these instructions at home: Medicines Take over-the-counter and prescription medicines only as told by your health care provider. These include fiber supplements, probiotics, and stool softeners. If you were prescribed an antibiotic medicine, take it as told by your health care provider. Do not stop taking the antibiotic even if you start to feel better. Ask your health care provider if the medicine prescribed to you requires you to avoid driving or using machinery. Eating and drinking  Follow a full liquid diet or another diet as directed by your health care provider. After your symptoms improve, your health care provider may tell you to change your diet. He or she may recommend that you eat a diet that contains at least 25 grams (25 g) of fiber daily. Fiber makes it easier to pass stool. Healthy sources of fiber include: Berries. One cup contains 4-8 grams of fiber. Beans or lentils. One-half cup contains 5-8 grams of fiber. Green vegetables. One cup contains 4 grams of fiber. Avoid eating red meat. General instructions Do not use any products that contain nicotine or tobacco, such as   cigarettes, e-cigarettes, and chewing tobacco. If you need help quitting, ask your health care provider. Exercise for at least 30 minutes, 3 times each week. You should exercise hard enough to  raise your heart rate and break a sweat. Keep all follow-up visits as told by your health care provider. This is important. You may need to have a colonoscopy. Contact a health care provider if: Your pain does not improve. Your bowel movements do not return to normal. Get help right away if: Your pain gets worse. Your symptoms do not get better with treatment. Your symptoms suddenly get worse. You have a fever. You vomit more than one time. You have stools that are bloody, black, or tarry. Summary Diverticulitis is infection or inflammation of small pouches (diverticula) in the colon that form due to a condition called diverticulosis. Diverticula can trap stool (feces) and bacteria, causing infection and inflammation. You are at higher risk for this condition if you have diverticulosis and you eat a diet that does not include enough fiber. Most cases of this condition are mild and can be treated at home. More severe cases may need to be treated at a hospital. When your condition is under control, your health care provider may recommend that you have an exam called a colonoscopy. This exam can show how severe your diverticula are and whether something else may be causing your symptoms. Keep all follow-up visits as told by your health care provider. This is important. This information is not intended to replace advice given to you by your health care provider. Make sure you discuss any questions you have with your health care provider. Document Revised: 01/21/2019 Document Reviewed: 01/21/2019 Elsevier Patient Education  2023 Elsevier Inc.  

## 2021-12-11 ENCOUNTER — Other Ambulatory Visit: Payer: Self-pay | Admitting: Emergency Medicine

## 2021-12-11 DIAGNOSIS — K5792 Diverticulitis of intestine, part unspecified, without perforation or abscess without bleeding: Secondary | ICD-10-CM

## 2021-12-11 MED ORDER — METRONIDAZOLE 500 MG PO TABS: 500.0000 mg | ORAL_TABLET | Freq: Two times a day (BID) | ORAL | 0 refills | Status: AC

## 2021-12-11 MED ORDER — CIPROFLOXACIN HCL 500 MG PO TABS: 500.0000 mg | ORAL_TABLET | Freq: Two times a day (BID) | ORAL | 0 refills | Status: AC

## 2021-12-11 NOTE — Telephone Encounter (Signed)
New prescription for Cipro and Flagyl sent to pharmacy of record.  Thanks.

## 2021-12-13 ENCOUNTER — Telehealth: Payer: Self-pay | Admitting: *Deleted

## 2021-12-13 NOTE — Telephone Encounter (Signed)
PA for Saxenda submitted,awaiting response Key: [id number]

## 2021-12-14 DIAGNOSIS — R5383 Other fatigue: Secondary | ICD-10-CM | POA: Diagnosis not present

## 2021-12-14 DIAGNOSIS — Z01419 Encounter for gynecological examination (general) (routine) without abnormal findings: Secondary | ICD-10-CM | POA: Diagnosis not present

## 2021-12-14 DIAGNOSIS — N926 Irregular menstruation, unspecified: Secondary | ICD-10-CM | POA: Diagnosis not present

## 2021-12-14 DIAGNOSIS — Z124 Encounter for screening for malignant neoplasm of cervix: Secondary | ICD-10-CM | POA: Diagnosis not present

## 2021-12-14 DIAGNOSIS — Z30431 Encounter for routine checking of intrauterine contraceptive device: Secondary | ICD-10-CM | POA: Diagnosis not present

## 2021-12-14 DIAGNOSIS — Z6839 Body mass index (BMI) 39.0-39.9, adult: Secondary | ICD-10-CM | POA: Diagnosis not present

## 2021-12-14 DIAGNOSIS — N952 Postmenopausal atrophic vaginitis: Secondary | ICD-10-CM | POA: Diagnosis not present

## 2021-12-15 NOTE — Telephone Encounter (Signed)
PA denied for Saxenda,  Called patient and left message to see if patient wants to precede with an appeal for this medication

## 2021-12-16 ENCOUNTER — Encounter: Payer: Self-pay | Admitting: *Deleted

## 2021-12-19 ENCOUNTER — Telehealth: Payer: Self-pay

## 2021-12-19 ENCOUNTER — Ambulatory Visit: Payer: BC Managed Care – PPO

## 2021-12-19 ENCOUNTER — Emergency Department (HOSPITAL_BASED_OUTPATIENT_CLINIC_OR_DEPARTMENT_OTHER): Payer: BC Managed Care – PPO

## 2021-12-19 ENCOUNTER — Other Ambulatory Visit: Payer: Self-pay

## 2021-12-19 ENCOUNTER — Emergency Department (HOSPITAL_BASED_OUTPATIENT_CLINIC_OR_DEPARTMENT_OTHER)
Admission: EM | Admit: 2021-12-19 | Discharge: 2021-12-19 | Disposition: A | Discharge status: Home / Self Care | Payer: BC Managed Care – PPO | Attending: Emergency Medicine | Admitting: Emergency Medicine

## 2021-12-19 ENCOUNTER — Encounter (HOSPITAL_BASED_OUTPATIENT_CLINIC_OR_DEPARTMENT_OTHER): Payer: Self-pay | Admitting: Emergency Medicine

## 2021-12-19 DIAGNOSIS — I82812 Embolism and thrombosis of superficial veins of left lower extremities: Secondary | ICD-10-CM | POA: Diagnosis not present

## 2021-12-19 DIAGNOSIS — M79605 Pain in left leg: Secondary | ICD-10-CM | POA: Diagnosis not present

## 2021-12-19 DIAGNOSIS — I8002 Phlebitis and thrombophlebitis of superficial vessels of left lower extremity: Secondary | ICD-10-CM | POA: Diagnosis not present

## 2021-12-19 DIAGNOSIS — M7122 Synovial cyst of popliteal space [Baker], left knee: Secondary | ICD-10-CM

## 2021-12-19 MED ORDER — OXYCODONE HCL 5 MG PO TABS: 5.0000 mg | ORAL_TABLET | Freq: Four times a day (QID) | ORAL | 0 refills | Status: DC | PRN

## 2021-12-19 MED ORDER — OXYCODONE-ACETAMINOPHEN 5-325 MG PO TABS
1.0000 | ORAL_TABLET | Freq: Once | ORAL | Status: AC
  Administered 2021-12-19: 1 via ORAL
  Filled 2021-12-19: qty 1

## 2021-12-19 NOTE — ED Provider Notes (Signed)
[facility] EMERGENCY DEPT Provider Note   CSN: [id number] Arrival date & time: 12/19/21  4174     History  Chief Complaint  Patient presents with   Leg Pain    Ann Santos is a 41 y.o. female.  Patient is a 41 year old female with a past medical history of DVT no longer on anticoagulation presenting to the emergency department with left leg pain.  Patient states over the last few days she has had increasing pain behind her left knee.  She denies any trauma or falls. She states that she felt like there was a little bit of redness and warmth behind her leg.  She denies significant swelling.  She states that she has been taking Tylenol and tramadol for pain with some relief.  She states that she cannot take NSAIDs due to previous bariatric surgery.  She denies any chest pain or shortness of breath.   The history is provided by the patient.  Leg Pain     Home Medications Prior to Admission medications   Medication Sig Start Date End Date Taking? Authorizing Provider  acetaminophen (TYLENOL) 325 MG tablet Take 650 mg by mouth every 6 (six) hours as needed for headache.    [provider]  amoxicillin-clavulanate (AUGMENTIN) 875-125 MG tablet Take 1 tablet by mouth 2 (two) times daily. 12/06/21   [name], NP  CALCIUM PO Take 1 tablet daily by mouth.    [provider]  Cyanocobalamin 1000 MCG SUBL Place under the tongue.    [provider]  fluconazole (DIFLUCAN) 150 MG tablet Take 1 tablet (150 mg total) by mouth as directed. At end of antibiotics, maybe repeat every 3 days. 12/10/21   Delorse Lek, FNP  Liraglutide -Weight Management (SAXENDA) 18 MG/3ML SOPN Inject 3 mg into the skin daily. 08/24/21   Georgina Quint, MD  Lisdexamfetamine Dimesylate (VYVANSE PO) Take by mouth. Patient not taking: Reported on 08/24/2021    [provider]  LYSINE PO Take 1 tablet by mouth daily.    [provider]  Multiple  Vitamins-Minerals (WOMENS MULTIVITAMIN) TABS Take 1 tablet by mouth daily.    [provider]  naproxen (NAPROSYN) 500 MG tablet Take 1 tablet (500 mg total) by mouth 2 (two) times daily. Patient not taking: Reported on 08/24/2021 06/28/21   Theadora Rama Scales, PA-C  ondansetron (ZOFRAN) 4 MG tablet Take 1 tablet (4 mg total) by mouth every 8 (eight) hours as needed for nausea or vomiting. 12/06/21   [name], NP  Probiotic Product (PROBIOTIC-10 PO) Probiotic    [provider]  sertraline (ZOLOFT) 100 MG tablet TAKE 2 TABLETS BY MOUTH DAILY 10/03/21   Georgina Quint, MD  TRAZODONE HCL PO Take by mouth.    [provider]  valACYclovir (VALTREX) 500 MG tablet TAKE 1 TABLET BY MOUTH TWICE A DAY 09/25/21   Georgina Quint, MD  VYVANSE 50 MG CHEW Chew 1 tablet by mouth every morning. 06/16/21   [provider]      Allergies    Patient has no known allergies.    Review of Systems   Review of Systems  Physical Exam Updated Vital Signs BP (!) 131/91 (BP Location: Right Arm)   Pulse 82   Temp 98.8 F (37.1 C) (Oral)   Resp 18   SpO2 98%  Physical Exam Vitals and nursing note reviewed.  Constitutional:      General: She is not in acute distress.  Appearance: Normal appearance. She is obese.  HENT:     Head: Normocephalic and atraumatic.     Mouth/Throat:     Mouth: Mucous membranes are moist.  Eyes:     Conjunctiva/sclera: Conjunctivae normal.  Cardiovascular:     Rate and Rhythm: Normal rate.  Pulmonary:     Effort: Pulmonary effort is normal.  Abdominal:     General: Abdomen is flat.  Musculoskeletal:        General: Normal range of motion.     Cervical back: Normal range of motion and neck supple.     Comments: Tenderness to palpation of L posterior leg overlying superficial vein without surrounding erythema or warmth Full knee ROM without palpable knee joint effusion  Skin:    Findings: No rash.  Neurological:      General: No focal deficit present.     Mental Status: She is alert and oriented to person, place, and time.  Psychiatric:        Mood and Affect: Mood normal.        Behavior: Behavior normal.     ED Results / Procedures / Treatments   Labs (all labs ordered are listed, but only abnormal results are displayed) Labs Reviewed - No data to display  EKG None  Radiology US Venous Img Lower Unilateral Left  Result Date: 12/19/2021 CLINICAL DATA:  Calf pain EXAM: LEFT LOWER EXTREMITY VENOUS DOPPLER ULTRASOUND TECHNIQUE: Gray-scale sonography with compression, as well as color and duplex ultrasound, were performed to evaluate the deep venous system(s) from the level of the common femoral vein through the popliteal and proximal calf veins. COMPARISON:  None Available. FINDINGS: VENOUS Normal compressibility of the common femoral, superficial femoral, and popliteal veins, as well as the visualized calf veins. Visualized portions of profunda femoral vein unremarkable. No filling defects to suggest DVT on grayscale or color Doppler imaging. Doppler waveforms show normal direction of venous flow, normal respiratory plasticity and response to augmentation. There is incompletely occlusive thrombus in the left great saphenous vein in the proximal thigh resulting in incomplete compressibility. Limited views of the contralateral common femoral vein are unremarkable. OTHER Elongated 3.9 x 0.5 x 1.9 cm collection in the posterior popliteal fossa extending into superior calf. Limitations: none IMPRESSION: 1. NEGATIVE for left lower extremity DVT. 2. Segmental nonocclusive superficial thrombophlebitis of the great saphenous vein in the proximal thigh. 3. Small Baker's cyst extending into proximal calf. Electronically Signed   By: Corlis Leak M.D.   On: 12/19/2021 11:16    Procedures Procedures    Medications Ordered in ED Medications  oxyCODONE-acetaminophen (PERCOCET/ROXICET) 5-325 MG per tablet 1 tablet (has  no administration in time range)    ED Course/ Medical Decision Making/ A&P Clinical Course as of 12/19/21 1249  Sun Dec 19, 2021  1247 PDMP was reviewed by myself, the patient was prescribed 15 tabs of tramadol about 2 weeks ago for her diverticulitis.  Per the patient's she took her last tramadol yesterday.  The patient has no other recent opioid prescriptions and she will be given a short course of oxycodone for pain control. [VK]    Clinical Course User Index [VK] Phoebe Sharps, DO                           Medical Decision Making This patient presents to the ED with chief complaint(s) of left leg pain with pertinent past medical history of prior DVT no longer on anticoagulation  which further complicates the presenting complaint. The complaint involves an extensive differential diagnosis and also carries with it a high risk of complications and morbidity.    The differential diagnosis includes possible DVT, no trauma or bony tenderness making fracture dislocation unlikely, possible musculoskeletal  Additional history obtained: N/A  ED Course and Reassessment: Patient initially had ultrasound performed in triage which showed no evidence of DVT.  Ultrasound did show a Baker's cyst as well as superficial thrombophlebitis which is likely contributing to her symptoms.  The patient states that she cannot take NSAIDs and will be given a dose of Percocet as well as a Ace bandage for support.  She will be instructed to use heat and ice packs and to follow-up with her primary doctor.  Independent labs interpretation:  N/A  Independent visualization of imaging: - I independently visualized the following imaging with scope of interpretation limited to determining acute life threatening conditions related to emergency care: Left lower extremity ultrasound, which revealed no DVT, Baker's cyst, superficial thrombophlebitis  Consultation: N/A  Consideration for admission or further workup:  Patient has no emergent conditions that require admission at this time Social Determinants of health: N/A    Risk Prescription drug management.           Final Clinical Impression(s) / ED Diagnoses Final diagnoses:  None    Rx / DC Orders ED Discharge Orders     None         Phoebe Sharps, DO 12/19/21 1251

## 2021-12-19 NOTE — Telephone Encounter (Signed)
The patient had a 0930 appt here today, patients c/c was hx of surgery, and she now has pain behind her knee, redness to the area and states the extremity feels swollen and warm. The patient was made aware that she can come here but it is recommended that she goes to the Emergency room due to [place] not having ultrasound (provider recommendation). The patient states at this time she does not have SOB or chest pain.   Patient states she will go to the ER now to be evaluated. Patient was educated to monitor herself for changes such as, SOB, chest pain, shoulder pain, increased changes to extremities, numbness, etc.  Patient verbalizes understanding.

## 2021-12-19 NOTE — Discharge Instructions (Signed)
You were seen in the emergency department for your leg pain.  You had no signs of blood clots in your leg.  You do have a Baker's cyst as well as a small clot in one of the superficial veins of your legs.  That small clot is not going to increase your risk of developing a DVT or blood clot in your lungs.  The Baker's cyst should go away on its own within a few weeks.  You should take NSAIDs if you can tolerate as these treat both the Baker's cyst and the thrombophlebitis.  You can use an Ace wrap for support.  You can otherwise take Tylenol for pain.  I did give you a few doses of oxycodone that you can take for breakthrough pain but do not take this before working, driving, operating heavy machinery or watching small children alone as this can make you drowsy.  You should follow-up with your primary doctor in the next few days to have your symptoms rechecked.  You should return to the emergency department if your pains getting significantly worse, you are having increasing swelling or redness of your leg, you have fevers, you are unable to walk or if you have any other new or concerning symptoms.

## 2021-12-19 NOTE — ED Triage Notes (Signed)
Left lower calf painful/warm to touch.pt does have hx of DVT few years ago and was on blood thinners and now not on them.

## 2021-12-24 DIAGNOSIS — F411 Generalized anxiety disorder: Secondary | ICD-10-CM | POA: Diagnosis not present

## 2021-12-24 DIAGNOSIS — F900 Attention-deficit hyperactivity disorder, predominantly inattentive type: Secondary | ICD-10-CM | POA: Diagnosis not present

## 2022-01-10 NOTE — Telephone Encounter (Signed)
PA for Saxenda submitted via cover my meds.

## 2022-01-10 NOTE — Telephone Encounter (Signed)
Appeal for Saxenda submitted electronically via covermymeds. Patient notified

## 2022-01-12 NOTE — Telephone Encounter (Signed)
Ann Santos appeal denied, Please advise on a different medication to start for this patient

## 2022-01-19 NOTE — Telephone Encounter (Signed)
How about Wegovy?

## 2022-02-10 DIAGNOSIS — F349 Persistent mood [affective] disorder, unspecified: Secondary | ICD-10-CM | POA: Diagnosis not present

## 2022-02-10 DIAGNOSIS — Z79899 Other long term (current) drug therapy: Secondary | ICD-10-CM | POA: Diagnosis not present

## 2022-02-10 DIAGNOSIS — F411 Generalized anxiety disorder: Secondary | ICD-10-CM | POA: Diagnosis not present

## 2022-02-10 DIAGNOSIS — F900 Attention-deficit hyperactivity disorder, predominantly inattentive type: Secondary | ICD-10-CM | POA: Diagnosis not present

## 2022-03-11 DIAGNOSIS — E785 Hyperlipidemia, unspecified: Secondary | ICD-10-CM | POA: Diagnosis not present

## 2022-03-11 DIAGNOSIS — Z6836 Body mass index (BMI) 36.0-36.9, adult: Secondary | ICD-10-CM | POA: Diagnosis not present

## 2022-03-11 DIAGNOSIS — E669 Obesity, unspecified: Secondary | ICD-10-CM | POA: Diagnosis not present

## 2022-03-11 DIAGNOSIS — F411 Generalized anxiety disorder: Secondary | ICD-10-CM | POA: Diagnosis not present

## 2022-03-11 DIAGNOSIS — F900 Attention-deficit hyperactivity disorder, predominantly inattentive type: Secondary | ICD-10-CM | POA: Diagnosis not present

## 2022-03-11 DIAGNOSIS — F349 Persistent mood [affective] disorder, unspecified: Secondary | ICD-10-CM | POA: Diagnosis not present

## 2022-03-16 IMAGING — CT CT ABD-PELV W/ CM
2 of 4 series · 16 of 46 positions shown, 18 images · IV contrast (omnipaque)
Comparison: CT 05/12/2018

CLINICAL DATA: 40-year-old female with history of diverticulitis.
Nausea and vomiting and LEFT lower quadrant pain.

EXAM:
CT ABDOMEN AND PELVIS WITH CONTRAST
TECHNIQUE: Multidetector CT imaging of the abdomen and pelvis was performed
using the standard protocol following bolus administration of
intravenous contrast.
CONTRAST:  100mL OMNIPAQUE IOHEXOL 300 MG/ML  SOLN

[Series 2: abd pel w · axial · 0.98mm/px · z∈[+375,+805]mm · 13 of 94 slices shown, 15 images]
[im 4/94  soft-tissue]
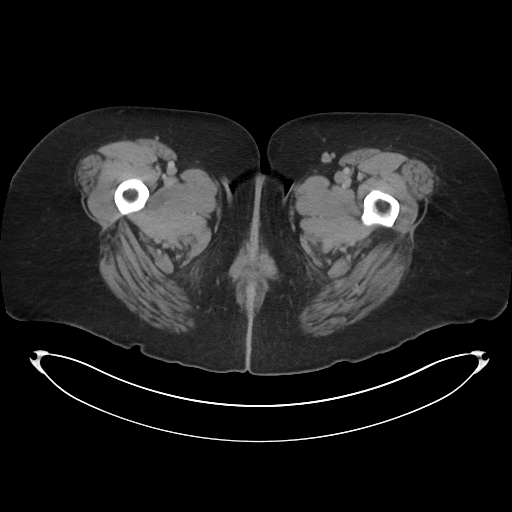
[im 4/94  bone]
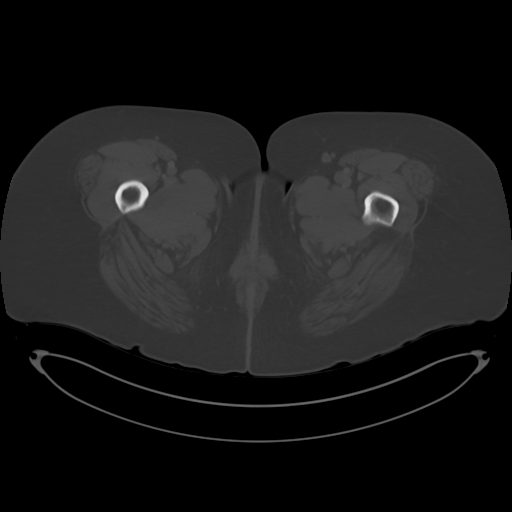
[im 12/94  soft-tissue]
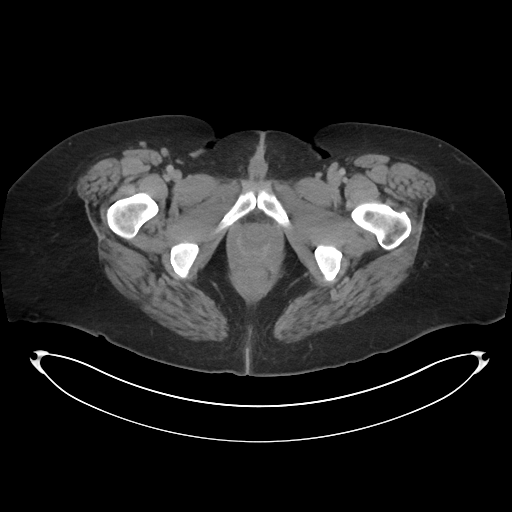
[im 20/94  soft-tissue]
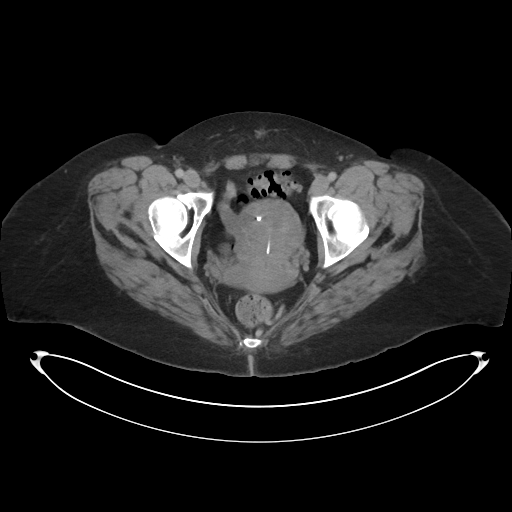
[im 28/94  soft-tissue]
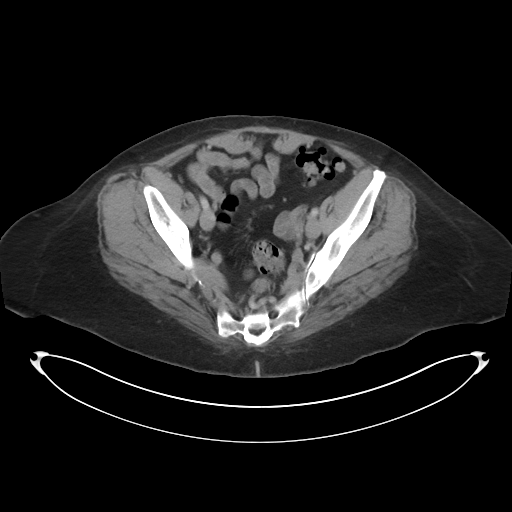
[im 32/94  soft-tissue]
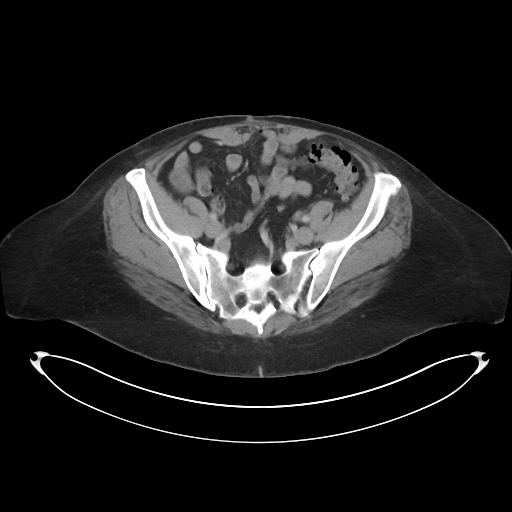
[im 39/94  soft-tissue]
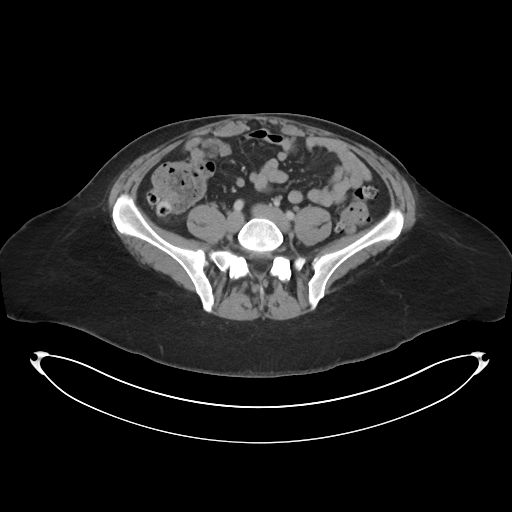
[im 47/94  soft-tissue]
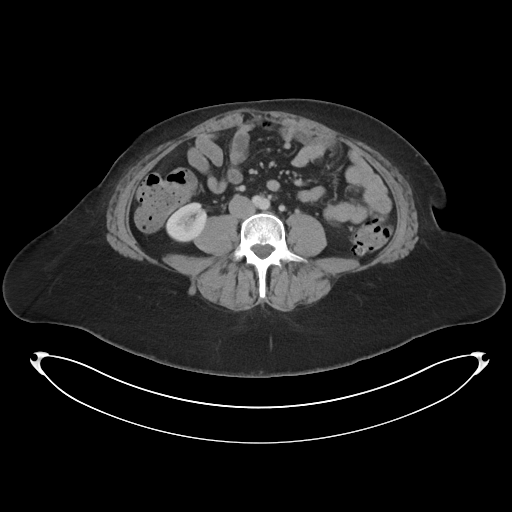
[im 55/94  soft-tissue]
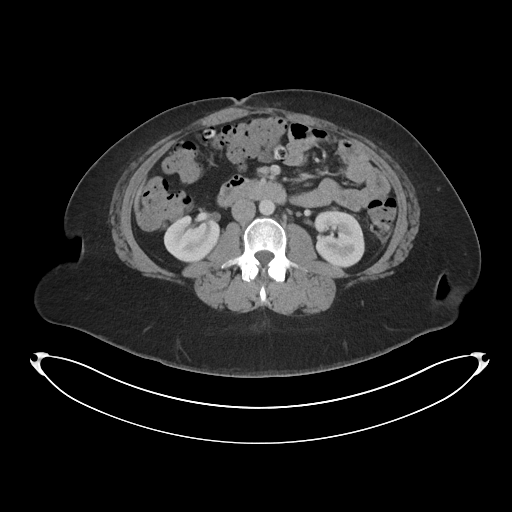
[im 63/94  soft-tissue]
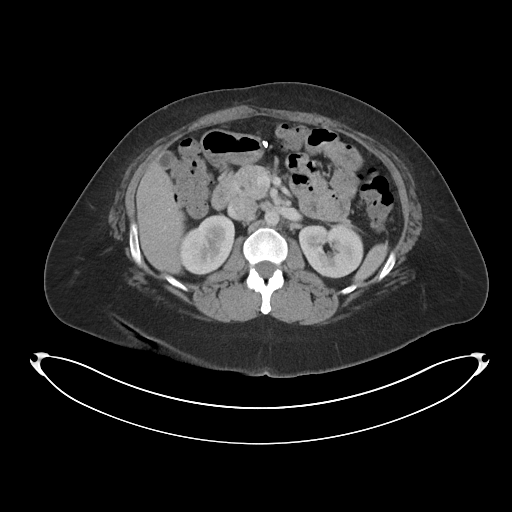
[im 63/94  bone]
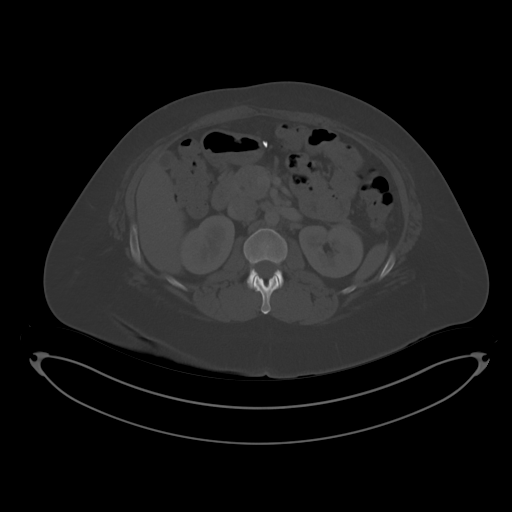
[im 66/94  soft-tissue]
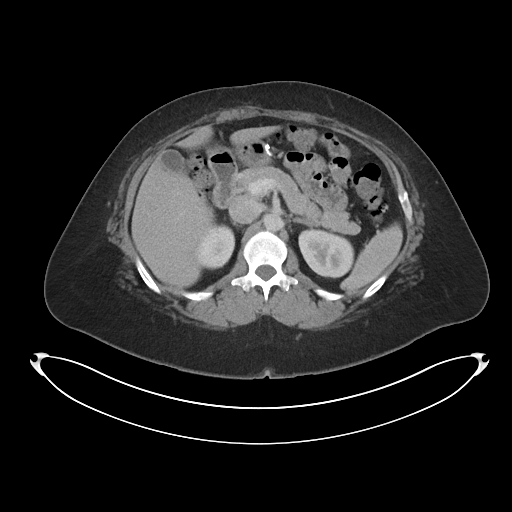
[im 74/94  soft-tissue]
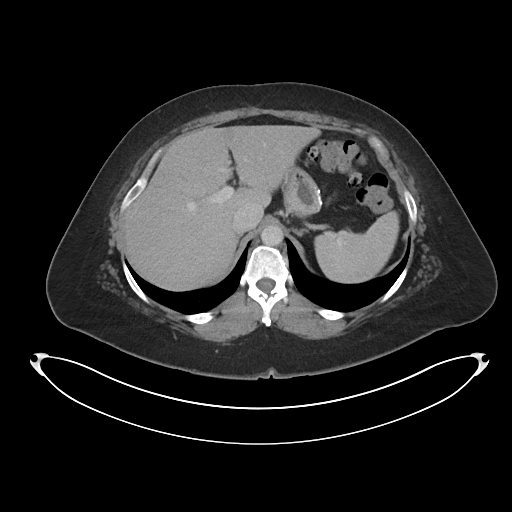
[im 82/94  soft-tissue]
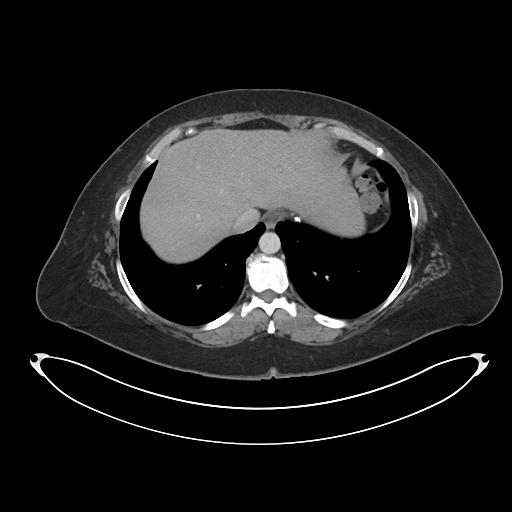
[im 90/94  soft-tissue]
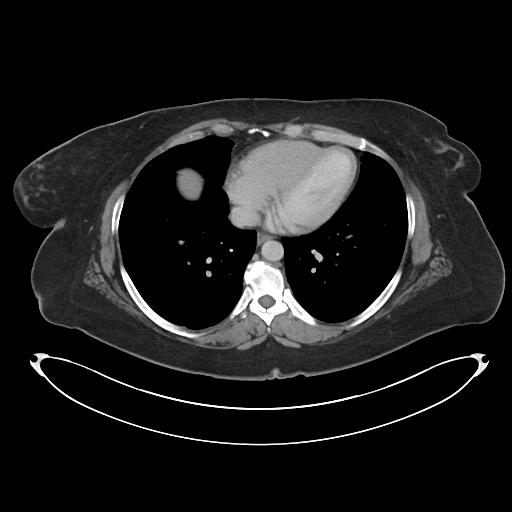

[Series 5: coronal · coronal · 0.87mm/px · 3 of 101 slices shown]
[im 34/101  soft-tissue]
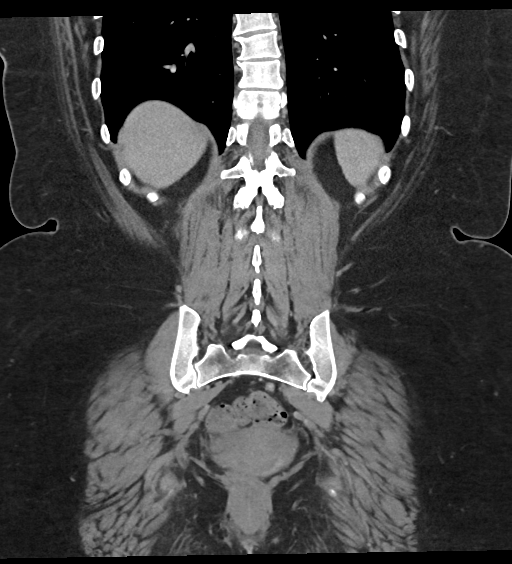
[im 45/101  soft-tissue]
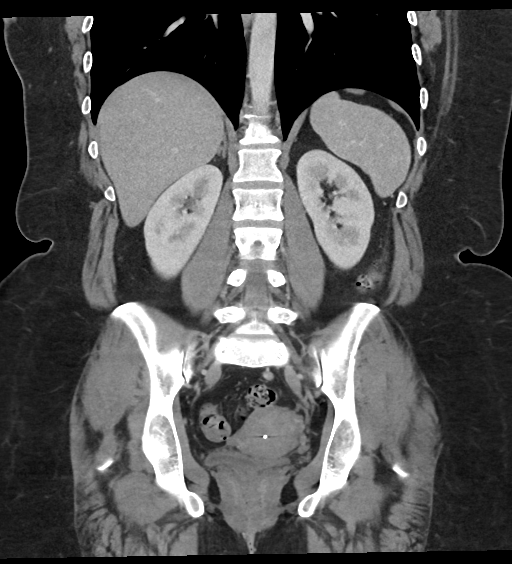
[im 56/101  soft-tissue]
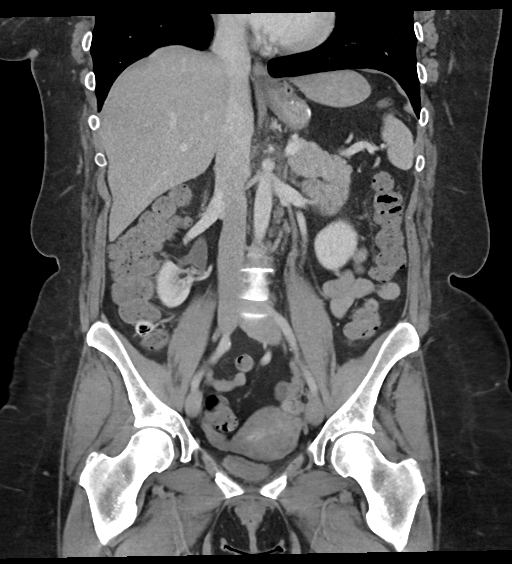

[16 of 46 positions shown; findings below may reference images not displayed]

FINDINGS: Lower chest: Lung bases are clear.

Hepatobiliary: Focal fatty infiltration along the falciform
ligament. Fatty infiltration along the gallbladder fossa. No
enhancing hepatic lesion identified. No biliary duct dilatation.
Gallbladder normal. Common bile duct normal.

Pancreas: Pancreas is normal. No ductal dilatation. No pancreatic
inflammation.

Spleen: Normal spleen

Adrenals/urinary tract: Adrenal glands and kidneys are normal. The
ureters and bladder normal.

Stomach/Bowel: Post bariatric surgery with gastric sleeve anatomy.
Stomach is normal otherwise. The duodenum and small-bowel normal.
Normal appendix. Several diverticula ascending colon and transverse
colon.

Multiple diverticula of the descending colon. There is mild
pericolonic inflammation and small amount of fluid in the LEFT
pericolic gutter just below the splenic flexure of the descending
colon (image 40/2). No perforation or abscess. Sigmoid colon rectum
normal.

Vascular/Lymphatic: Abdominal aorta normal caliber. No
abdominopelvic lymphadenopathy.

Prominent venous collaterals in the presacral space (image 69/series
2)

Reproductive: IUD in expected location.  History uterus

Other: No free fluid.

Musculoskeletal: No aggressive osseous lesion.
IMPRESSION: 1. Mild non complicated acute diverticulitis of the proximal
descending colon. No perforation or abscess.
2. Additional scattered diverticula throughout the colon.
3. Post gastric bariatric surgery without complication.
4. Mild venous collateralization in the presacral space.

## 2022-04-08 DIAGNOSIS — F411 Generalized anxiety disorder: Secondary | ICD-10-CM | POA: Diagnosis not present

## 2022-04-08 DIAGNOSIS — F349 Persistent mood [affective] disorder, unspecified: Secondary | ICD-10-CM | POA: Diagnosis not present

## 2022-04-08 DIAGNOSIS — F900 Attention-deficit hyperactivity disorder, predominantly inattentive type: Secondary | ICD-10-CM | POA: Diagnosis not present

## 2022-04-16 ENCOUNTER — Telehealth: Payer: BC Managed Care – PPO | Admitting: Urgent Care

## 2022-04-16 DIAGNOSIS — K5792 Diverticulitis of intestine, part unspecified, without perforation or abscess without bleeding: Secondary | ICD-10-CM | POA: Diagnosis not present

## 2022-04-16 MED ORDER — ONDANSETRON 4 MG PO TBDP: 4.0000 mg | ORAL_TABLET | Freq: Three times a day (TID) | ORAL | 0 refills | Status: DC | PRN

## 2022-04-16 MED ORDER — AMOXICILLIN-POT CLAVULANATE 875-125 MG PO TABS: 1.0000 | ORAL_TABLET | Freq: Two times a day (BID) | ORAL | 0 refills | Status: DC

## 2022-04-16 MED ORDER — FLUCONAZOLE 150 MG PO TABS: 150.0000 mg | ORAL_TABLET | Freq: Once | ORAL | 0 refills | Status: AC

## 2022-04-16 NOTE — Patient Instructions (Signed)
Ann Santos, thank you for joining Maretta Bees, PA for today's virtual visit.  While this provider is not your primary care provider (PCP), if your PCP is located in our provider database this encounter information will be shared with them immediately following your visit.   A [facility] MyChart account gives you access to today's visit and all your visits, tests, and labs performed at [facility] " click here if you don't have a [facility] MyChart account or go to [email or web address]  Consent: (Patient) Ann Santos provided verbal consent for this virtual visit at the beginning of the encounter.  Current Medications:  Current Outpatient Medications:    amoxicillin-clavulanate (AUGMENTIN) 875-125 MG tablet, Take 1 tablet by mouth 2 (two) times daily., Disp: 20 tablet, Rfl: 0   fluconazole (DIFLUCAN) 150 MG tablet, Take 1 tablet (150 mg total) by mouth once for 1 dose. May repeat in 72 hours as needed, Disp: 2 tablet, Rfl: 0   ondansetron (ZOFRAN-ODT) 4 MG disintegrating tablet, Take 1 tablet (4 mg total) by mouth every 8 (eight) hours as needed for nausea or vomiting., Disp: 20 tablet, Rfl: 0   acetaminophen (TYLENOL) 325 MG tablet, Take 650 mg by mouth every 6 (six) hours as needed for headache., Disp: , Rfl:    CALCIUM PO, Take 1 tablet daily by mouth., Disp: , Rfl:    Cyanocobalamin 1000 MCG SUBL, Place under the tongue., Disp: , Rfl:    Liraglutide -Weight Management (SAXENDA) 18 MG/3ML SOPN, Inject 3 mg into the skin daily., Disp: 3 mL, Rfl: 7   Lisdexamfetamine Dimesylate (VYVANSE PO), Take by mouth. (Patient not taking: Reported on 08/24/2021), Disp: , Rfl:    LYSINE PO, Take 1 tablet by mouth daily., Disp: , Rfl:    Multiple Vitamins-Minerals (WOMENS MULTIVITAMIN) TABS, Take 1 tablet by mouth daily., Disp: , Rfl:    naproxen (NAPROSYN) 500 MG tablet, Take 1 tablet (500 mg total) by mouth 2 (two) times daily. (Patient not taking: Reported on 08/24/2021), Disp:  30 tablet, Rfl: 0   ondansetron (ZOFRAN) 4 MG tablet, Take 1 tablet (4 mg total) by mouth every 8 (eight) hours as needed for nausea or vomiting., Disp: 10 tablet, Rfl: 0   oxyCODONE (ROXICODONE) 5 MG immediate release tablet, Take 1 tablet (5 mg total) by mouth every 6 (six) hours as needed for severe pain., Disp: 6 tablet, Rfl: 0   Probiotic Product (PROBIOTIC-10 PO), Probiotic, Disp: , Rfl:    sertraline (ZOLOFT) 100 MG tablet, TAKE 2 TABLETS BY MOUTH DAILY, Disp: 180 tablet, Rfl: 1   TRAZODONE HCL PO, Take by mouth., Disp: , Rfl:    valACYclovir (VALTREX) 500 MG tablet, TAKE 1 TABLET BY MOUTH TWICE A DAY, Disp: 20 tablet, Rfl: 5   VYVANSE 50 MG CHEW, Chew 1 tablet by mouth every morning., Disp: , Rfl:    Medications ordered in this encounter:  Meds ordered this encounter  Medications   amoxicillin-clavulanate (AUGMENTIN) 875-125 MG tablet    Sig: Take 1 tablet by mouth 2 (two) times daily.    Dispense:  20 tablet    Refill:  0    Order Specific Question:   Supervising Provider    Answer:   Merrilee Jansky [[id number]]   fluconazole (DIFLUCAN) 150 MG tablet    Sig: Take 1 tablet (150 mg total) by mouth once for 1 dose. May repeat in 72 hours as needed    Dispense:  2 tablet    Refill:  0  Order Specific Question:   Supervising Provider    Answer:   Merrilee Jansky [[id number]]   ondansetron (ZOFRAN-ODT) 4 MG disintegrating tablet    Sig: Take 1 tablet (4 mg total) by mouth every 8 (eight) hours as needed for nausea or vomiting.    Dispense:  20 tablet    Refill:  0    Order Specific Question:   Supervising Provider    Answer:   Merrilee Jansky [id number]     *If you need refills on other medications prior to your next appointment, please contact your pharmacy*  Follow-Up: Call back or seek an in-person evaluation if the symptoms worsen or if the condition fails to improve as anticipated.  [facility] [phone]  Other Instructions Start taking Augmentin  twice daily with food until gone. Do not stop taking them early just because you are feeling better.  Use diflucan if symptoms of a vaginal yeast infection develop. Use zofran as needed for nausea or vomiting.  If you develop worsening abdominal pain, blood in the stool or a fever, please head to the nearest emergency room.   If you have been instructed to have an in-person evaluation today at a local Urgent Care facility, please use the link below. It will take you to a list of all of our available [facility], including address, phone number and hours of operation. Please do not delay care.  [facility]  If you or a family member do not have a primary care provider, use the link below to schedule a visit and establish care. When you choose a [facility] primary care physician or advanced practice provider, you gain a long-term partner in health. Find a Primary Care Provider  Learn more about [facility] in-office and virtual care options: [facility] - Get Care Now

## 2022-04-16 NOTE — Progress Notes (Signed)
Virtual Visit Consent   Ann Santos, you are scheduled for a virtual visit with a [facility] provider today. Just as with appointments in the office, your consent must be obtained to participate. Your consent will be active for this visit and any virtual visit you may have with one of our providers in the next 365 days. If you have a MyChart account, a copy of this consent can be sent to you electronically.  As this is a virtual visit, video technology does not allow for your provider to perform a traditional examination. This may limit your provider's ability to fully assess your condition. If your provider identifies any concerns that need to be evaluated in person or the need to arrange testing (such as labs, EKG, etc.), we will make arrangements to do so. Although advances in technology are sophisticated, we cannot ensure that it will always work on either your end or our end. If the connection with a video visit is poor, the visit may have to be switched to a telephone visit. With either a video or telephone visit, we are not always able to ensure that we have a secure connection.  By engaging in this virtual visit, you consent to the provision of healthcare and authorize for your insurance to be billed (if applicable) for the services provided during this visit. Depending on your insurance coverage, you may receive a charge related to this service.  I need to obtain your verbal consent now. Are you willing to proceed with your visit today? Sydni R Marion has provided verbal consent on 04/16/2022 for a virtual visit (video or telephone). Maretta Bees, PA  Date: 04/16/2022 9:14 AM  Virtual Visit via Video Note   I, Marcela Alatorre L Casey Maxfield, connected with  Patrcia Schnepp Lemelle  ([id number], 28-Jun-1980) on 04/16/22 at  9:00 AM EST by a video-enabled telemedicine application and verified that I am speaking with the correct person using two identifiers.  Location: Patient: Virtual Visit Location Patient:  Home Provider: Virtual Visit Location Provider: Home Office   I discussed the limitations of evaluation and management by telemedicine and the availability of in person appointments. The patient expressed understanding and agreed to proceed.    History of Present Illness: Ann Santos is a 41 y.o. who identifies as a female who was assigned female at birth, and is being seen today for diverticulitis.  HPI:  41yo female with CT and endoscopy confirmed diverticulitis in the past presents today with concern of a flare. States she has had this twice since Dec 2022 and states sx are similar. Denies fever. States the pain is in the R and LLQ, which is where it always presents. No change in Bms, no blood in stool. Denies rigid abdomen. Some nausea, but no vomiting. Has taken numerous treatments in the past, feels that Augmentin works better for her than the dual cipro/flagyl therapy.    Problems:  Patient Active Problem List   Diagnosis Date Noted   History of DVT (deep vein thrombosis) 08/29/2019   On continuous oral anticoagulation 08/29/2019   Morbid obesity ([facility]) 11/17/2014   Diverticular disease 10/23/2012   Umbilical hernia 10/26/2011    Allergies: No Known Allergies Medications:  Current Outpatient Medications:    amoxicillin-clavulanate (AUGMENTIN) 875-125 MG tablet, Take 1 tablet by mouth 2 (two) times daily., Disp: 20 tablet, Rfl: 0   fluconazole (DIFLUCAN) 150 MG tablet, Take 1 tablet (150 mg total) by mouth once for 1 dose. May repeat in 72 hours  as needed, Disp: 2 tablet, Rfl: 0   ondansetron (ZOFRAN-ODT) 4 MG disintegrating tablet, Take 1 tablet (4 mg total) by mouth every 8 (eight) hours as needed for nausea or vomiting., Disp: 20 tablet, Rfl: 0   acetaminophen (TYLENOL) 325 MG tablet, Take 650 mg by mouth every 6 (six) hours as needed for headache., Disp: , Rfl:    CALCIUM PO, Take 1 tablet daily by mouth., Disp: , Rfl:    Cyanocobalamin 1000 MCG SUBL, Place under the tongue.,  Disp: , Rfl:    Liraglutide -Weight Management (SAXENDA) 18 MG/3ML SOPN, Inject 3 mg into the skin daily., Disp: 3 mL, Rfl: 7   Lisdexamfetamine Dimesylate (VYVANSE PO), Take by mouth. (Patient not taking: Reported on 08/24/2021), Disp: , Rfl:    LYSINE PO, Take 1 tablet by mouth daily., Disp: , Rfl:    Multiple Vitamins-Minerals (WOMENS MULTIVITAMIN) TABS, Take 1 tablet by mouth daily., Disp: , Rfl:    naproxen (NAPROSYN) 500 MG tablet, Take 1 tablet (500 mg total) by mouth 2 (two) times daily. (Patient not taking: Reported on 08/24/2021), Disp: 30 tablet, Rfl: 0   ondansetron (ZOFRAN) 4 MG tablet, Take 1 tablet (4 mg total) by mouth every 8 (eight) hours as needed for nausea or vomiting., Disp: 10 tablet, Rfl: 0   oxyCODONE (ROXICODONE) 5 MG immediate release tablet, Take 1 tablet (5 mg total) by mouth every 6 (six) hours as needed for severe pain., Disp: 6 tablet, Rfl: 0   Probiotic Product (PROBIOTIC-10 PO), Probiotic, Disp: , Rfl:    sertraline (ZOLOFT) 100 MG tablet, TAKE 2 TABLETS BY MOUTH DAILY, Disp: 180 tablet, Rfl: 1   TRAZODONE HCL PO, Take by mouth., Disp: , Rfl:    valACYclovir (VALTREX) 500 MG tablet, TAKE 1 TABLET BY MOUTH TWICE A DAY, Disp: 20 tablet, Rfl: 5   VYVANSE 50 MG CHEW, Chew 1 tablet by mouth every morning., Disp: , Rfl:   Observations/Objective: Patient is well-developed, well-nourished in no acute distress.  Resting comfortably in a chair at home.  Head is normocephalic, atraumatic.  No labored breathing.  Speech is clear and coherent with logical content.  Patient is alert and oriented at baseline.    Assessment and Plan: 1. Diverticulitis  Pt with hx of same. CT one year ago confirmed diverticulitis. Pt with similar sx to previous infections. Denies sx concerning for sepsis or acute abdomen. Pt requesting Augmentin as this works better for her. Needs diflucan due to yeast infections on abx. Is requesting zofran. Pt understands any worsening symptoms would require  an in person evaluation.  Follow Up Instructions: I discussed the assessment and treatment plan with the patient. The patient was provided an opportunity to ask questions and all were answered. The patient agreed with the plan and demonstrated an understanding of the instructions.  A copy of instructions were sent to the patient via MyChart unless otherwise noted below.    The patient was advised to call back or seek an in-person evaluation if the symptoms worsen or if the condition fails to improve as anticipated.  Time:  I spent 10 minutes with the patient via telehealth technology discussing the above problems/concerns.    Shakeia Krus L Malynda Smolinski, PA

## 2022-04-19 ENCOUNTER — Encounter (HOSPITAL_BASED_OUTPATIENT_CLINIC_OR_DEPARTMENT_OTHER): Payer: Self-pay

## 2022-04-19 ENCOUNTER — Emergency Department (HOSPITAL_BASED_OUTPATIENT_CLINIC_OR_DEPARTMENT_OTHER): Payer: BC Managed Care – PPO

## 2022-04-19 ENCOUNTER — Emergency Department (HOSPITAL_BASED_OUTPATIENT_CLINIC_OR_DEPARTMENT_OTHER)
Admission: EM | Admit: 2022-04-19 | Discharge: 2022-04-20 | Disposition: A | Discharge status: Home / Self Care | Payer: BC Managed Care – PPO | Attending: Emergency Medicine | Admitting: Emergency Medicine

## 2022-04-19 ENCOUNTER — Other Ambulatory Visit: Payer: Self-pay

## 2022-04-19 ENCOUNTER — Ambulatory Visit: Admit: 2022-04-19 | Payer: BC Managed Care – PPO

## 2022-04-19 DIAGNOSIS — B9789 Other viral agents as the cause of diseases classified elsewhere: Secondary | ICD-10-CM | POA: Insufficient documentation

## 2022-04-19 DIAGNOSIS — K5900 Constipation, unspecified: Secondary | ICD-10-CM | POA: Diagnosis not present

## 2022-04-19 DIAGNOSIS — J069 Acute upper respiratory infection, unspecified: Secondary | ICD-10-CM | POA: Insufficient documentation

## 2022-04-19 DIAGNOSIS — R059 Cough, unspecified: Secondary | ICD-10-CM | POA: Diagnosis not present

## 2022-04-19 DIAGNOSIS — Z20822 Contact with and (suspected) exposure to covid-19: Secondary | ICD-10-CM | POA: Diagnosis not present

## 2022-04-19 DIAGNOSIS — R109 Unspecified abdominal pain: Secondary | ICD-10-CM | POA: Diagnosis not present

## 2022-04-19 LAB — RESP PANEL BY RT-PCR (RSV, FLU A&B, COVID)  RVPGX2
Influenza A by PCR: NEGATIVE
Influenza B by PCR: NEGATIVE
Resp Syncytial Virus by PCR: NEGATIVE
SARS Coronavirus 2 by RT PCR: NEGATIVE

## 2022-04-19 NOTE — ED Triage Notes (Signed)
Patient here POV from Home.  Endorses Friday, the patient was having LLQ ABD Pain. Patient initially thought she was having a Diverticulitis Exacerbation and was taking Antibiotic Therapy and Zofran with PCP but then began to have Cough, Headache, Congestion.   No Fevers but some Chills.   NAD Noted during Triage. A&Ox4. GCS 15. Ambulatory.

## 2022-04-19 NOTE — ED Provider Notes (Signed)
[facility] EMERGENCY DEPT Provider Note   CSN: [id number] Arrival date & time: 04/19/22  1704     History Chief Complaint  Patient presents with   Cough    Ann Santos is a 41 y.o. female.   Cough Associated symptoms: chills   Associated symptoms: no chest pain, no fever, no headaches, no rash and no shortness of breath   Patient presented to the emergency department with complaints of a cough and abdominal pain for several days.  She reports that she first initially began having abdominal pain which she thought was diverticulitis and is being treated by her primary care provider with antibiotic therapy without improvement in symptoms.  She reports that she then began to experience respiratory symptoms such as a cough, congestion, headache, chills.  She reports that symptoms have not improved in the last several days and is concerned about possible cause of symptoms.  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     Home Medications Prior to Admission medications   Medication Sig Start Date End Date Taking? Authorizing Provider  acetaminophen (TYLENOL) 325 MG tablet Take 650 mg by mouth every 6 (six) hours as needed for headache.    [provider]  amoxicillin-clavulanate (AUGMENTIN) 875-125 MG tablet Take 1 tablet by mouth 2 (two) times daily. 04/16/22   Crain, Whitney L, PA  CALCIUM PO Take 1 tablet daily by mouth.    [provider]  Cyanocobalamin 1000 MCG SUBL Place under the tongue.    [provider]  Liraglutide -Weight Management (SAXENDA) 18 MG/3ML SOPN Inject 3 mg into the skin daily. 08/24/21   Horald Pollen, MD  Lisdexamfetamine Dimesylate (VYVANSE PO) Take by mouth. Patient not taking: Reported on 08/24/2021    [provider]  LYSINE PO Take 1 tablet by mouth daily.    [provider]  Multiple Vitamins-Minerals (WOMENS MULTIVITAMIN) TABS Take 1 tablet by mouth daily.    [provider]  naproxen (NAPROSYN) 500 MG tablet Take 1 tablet (500 mg total) by mouth 2 (two) times daily. Patient not taking: Reported on 08/24/2021 06/28/21   Lynden Oxford Scales, PA-C  ondansetron (ZOFRAN) 4 MG tablet Take 1 tablet (4 mg total) by mouth every 8 (eight) hours as needed for nausea or vomiting. 12/06/21   Perlie Mayo, NP  ondansetron (ZOFRAN-ODT) 4 MG disintegrating tablet Take 1 tablet (4 mg total) by mouth every 8 (eight) hours as needed for nausea or vomiting. 04/16/22   Crain, Whitney L, PA  oxyCODONE (ROXICODONE) 5 MG immediate release tablet Take 1 tablet (5 mg total) by mouth every 6 (six) hours as needed for severe pain. 12/19/21   Kemper Durie, DO  Probiotic Product (PROBIOTIC-10 PO) Probiotic    [provider]  sertraline (ZOLOFT) 100 MG tablet TAKE 2 TABLETS BY MOUTH DAILY 10/03/21   Horald Pollen, MD  TRAZODONE HCL PO Take by mouth.    [provider]  valACYclovir (VALTREX) 500 MG tablet TAKE 1 TABLET BY MOUTH TWICE A DAY 09/25/21   Horald Pollen, MD  VYVANSE 50 MG CHEW Chew 1 tablet by mouth every morning. 06/16/21   [provider]      Allergies    Patient has no known allergies.    Review of Systems   Review of Systems  Constitutional:  Positive for  chills. Negative for appetite change and fever.  Respiratory:  Positive for cough. Negative for choking, chest tightness and shortness of breath.   Cardiovascular:  Negative for chest pain.  Gastrointestinal:  Positive for abdominal pain and constipation. Negative for diarrhea, nausea, rectal pain and vomiting.  Genitourinary:  Positive for frequency.  Skin:  Negative for rash and wound.  Neurological:  Negative for weakness, light-headedness and headaches.  All other systems reviewed and are negative.   Physical Exam Updated Vital Signs BP (!)  152/93   Pulse 78   Temp 99 F (37.2 C) (Oral)   Resp 12   Ht 5\' 2"  (1.575 m)   Wt 96.8 kg   SpO2 100%   BMI 39.03 kg/m  Physical Exam Vitals and nursing note reviewed.  Constitutional:      Appearance: Normal appearance.  HENT:     Head: Normocephalic and atraumatic.  Eyes:     Conjunctiva/sclera: Conjunctivae normal.  Cardiovascular:     Rate and Rhythm: Normal rate and regular rhythm.  Pulmonary:     Effort: Pulmonary effort is normal.     Breath sounds: Normal breath sounds.  Abdominal:     General: Bowel sounds are normal. There is no distension.     Palpations: There is no mass.     Tenderness: There is abdominal tenderness. There is no guarding or rebound.     Hernia: No hernia is present.  Skin:    General: Skin is warm and dry.  Neurological:     General: No focal deficit present.     Mental Status: She is alert. Mental status is at baseline.     ED Results / Procedures / Treatments   Labs (all labs ordered are listed, but only abnormal results are displayed) Labs Reviewed  RESP PANEL BY RT-PCR (RSV, FLU A&B, COVID)  [id number]  URINALYSIS, ROUTINE W REFLEX MICROSCOPIC  PREGNANCY, URINE  COMPREHENSIVE METABOLIC PANEL  CBC WITH DIFFERENTIAL/PLATELET    EKG None  Radiology DG Abdomen 1 View  Result Date: 04/19/2022 CLINICAL DATA:  Left-sided abdominal pain EXAM: ABDOMEN - 1 VIEW COMPARISON:  06/17/2012 FINDINGS: Scattered large and small bowel gas is noted. Mild retained fecal material is noted within the right and transverse colons. No obstructive changes are seen. No free air is noted. IUD is noted in place. No bony abnormality is seen. IMPRESSION: Moderate retained fecal material consistent with a degree of constipation. No other focal abnormality is noted. Electronically Signed   By: Inez Catalina M.D.   On: 04/19/2022 23:29    Procedures Procedures   Medications Ordered in ED Medications - No data to display  ED Course/ Medical Decision Making/  A&P                           Medical Decision Making Amount and/or Complexity of Data Reviewed Labs: ordered. Radiology: ordered.   This patient presents to the ED for concern of cough and abdominal pain.  Differential diagnosis includes viral URI, constipation, gastroenteritis   Lab Tests:  I Ordered, and personally interpreted labs.  The pertinent results include: Negative respiratory viral panel   Imaging Studies ordered:  I ordered imaging studies including KUB I independently visualized and interpreted imaging which showed large stool burden consistent with constipation I agree with the radiologist interpretation   Medicines ordered and prescription drug management:  I have reviewed the patients home medicines and have made adjustments as needed   Problem  List / ED Course:  Patient presented emergency department complaints of cough abdominal pain.  She stated to me that she was sleeping and having abdominal pain which she believes is diverticulitis seen on prior occurrences with this and she was treated with antibiotic therapy which was not able to resolve symptoms.  She then began to experience respiratory-like symptoms with a cough, congestion, headaches.  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  Based on this history basic labs and urine were assessed as well as an abdominal x-ray.   11:58 PM Care of Ann Santos transferred to and Dr. Bebe Shaggy at the end of my shift as the patient will require reassessment once labs/imaging have resulted. Patient presentation, ED course, and plan of care discussed with review of all pertinent labs and imaging. Please see his/her note for further details regarding further ED course and disposition. Plan at time of handoff is await pending labs and plan to discharge home with home Miralax for constipation and symptomatic  control for likely viral URI. This may be altered or completely changed at the discretion of the oncoming team pending results of further workup.    Final Clinical Impression(s) / ED Diagnoses Final diagnoses:  Constipation, unspecified constipation type  Viral URI with cough    Rx / DC Orders ED Discharge Orders     None         Smitty Knudsen, PA-C 04/20/22 0000    [facility] Norfolk, DO 04/20/22 0101

## 2022-04-20 ENCOUNTER — Telehealth: Payer: Self-pay | Admitting: *Deleted

## 2022-04-20 LAB — URINALYSIS, ROUTINE W REFLEX MICROSCOPIC
Bilirubin Urine: NEGATIVE
Glucose, UA: NEGATIVE mg/dL
Hgb urine dipstick: NEGATIVE
Ketones, ur: NEGATIVE mg/dL
Leukocytes,Ua: NEGATIVE
Nitrite: NEGATIVE
Protein, ur: NEGATIVE mg/dL
Specific Gravity, Urine: 1.013 (ref 1.005–1.030)
pH: 6 (ref 5.0–8.0)

## 2022-04-20 LAB — COMPREHENSIVE METABOLIC PANEL
ALT: 11 U/L (ref 0–44)
AST: 12 U/L — ABNORMAL LOW (ref 15–41)
Albumin: 4.6 g/dL (ref 3.5–5.0)
Alkaline Phosphatase: 45 U/L (ref 38–126)
Anion gap: 8 (ref 5–15)
BUN: 10 mg/dL (ref 6–20)
CO2: 29 mmol/L (ref 22–32)
Calcium: 9.8 mg/dL (ref 8.9–10.3)
Chloride: 100 mmol/L (ref 98–111)
Creatinine, Ser: 0.5 mg/dL (ref 0.44–1.00)
GFR, Estimated: >60 mL/min (ref >60)
Glucose, Bld: 76 mg/dL (ref 70–99)
Potassium: 3.5 mmol/L (ref 3.5–5.1)
Sodium: 137 mmol/L (ref 135–145)
Total Bilirubin: 0.3 mg/dL (ref 0.3–1.2)
Total Protein: 7.9 g/dL (ref 6.5–8.1)

## 2022-04-20 LAB — CBC WITH DIFFERENTIAL/PLATELET
Abs Immature Granulocytes: 0.04 10*3/uL (ref 0.00–0.07)
Basophils Absolute: 0.1 10*3/uL (ref 0.0–0.1)
Basophils Relative: 1 %
Eosinophils Absolute: 0.1 10*3/uL (ref 0.0–0.5)
Eosinophils Relative: 2 %
HCT: 44.4 % (ref 36.0–46.0)
Hemoglobin: 15.3 g/dL — ABNORMAL HIGH (ref 12.0–15.0)
Immature Granulocytes: 0 %
Lymphocytes Relative: 30 %
Lymphs Abs: 2.8 10*3/uL (ref 0.7–4.0)
MCH: 31.4 pg (ref 26.0–34.0)
MCHC: 34.5 g/dL (ref 30.0–36.0)
MCV: 91 fL (ref 80.0–100.0)
Monocytes Absolute: 0.7 10*3/uL (ref 0.1–1.0)
Monocytes Relative: 7 %
Neutro Abs: 5.7 10*3/uL (ref 1.7–7.7)
Neutrophils Relative %: 60 %
Platelets: 370 10*3/uL (ref 150–400)
RBC: 4.88 MIL/uL (ref 3.87–5.11)
RDW: 11.5 % (ref 11.5–15.5)
WBC: 9.4 10*3/uL (ref 4.0–10.5)
nRBC: 0 % (ref 0.0–0.2)

## 2022-04-20 LAB — PREGNANCY, URINE: Preg Test, Ur: NEGATIVE

## 2022-04-20 NOTE — ED Provider Notes (Signed)
Overall labs are reassuring.  Patient is well-appearing.  No focal abdominal tenderness.  She reports recently started Augmentin for possible diverticulitis.  She reports those symptoms have resolved.  Now reporting upper respiratory infection symptoms. At this point she will be discharged home.  Advised for her to continue the Augmentin.  She can also take medications for constipation. There are no signs of acute abdominal emergency at this time.   Zadie Rhine, MD 04/20/22 [phone]

## 2022-04-20 NOTE — ED Notes (Signed)
Pt verbalized understanding of all dc instructions pt did not wish to stay for repeat vitals

## 2022-04-20 NOTE — Telephone Encounter (Signed)
Transition Care Management Unsuccessful Follow-up Telephone Call  Date of discharge and from where:  04/20/2022 @ [facility] ED  Attempts:  1st Attempt  Reason for unsuccessful TCM follow-up call:  Left voice message

## 2022-04-20 NOTE — ED Notes (Signed)
Have called lab numerous times re: lab results & why the delay in processing

## 2022-04-21 NOTE — Telephone Encounter (Signed)
Transition Care Management Follow-up Telephone Call Date of discharge and from where: 04/20/2022 @ [facility] ED How have you been since you were released from the hospital? Patient states she is better now Any questions or concerns? No  Items Reviewed: Did the pt receive and understand the discharge instructions provided? Yes  Medications obtained and verified? Yes  Other? No  Any new allergies since your discharge? No  Dietary orders reviewed? No Do you have support at home? Yes   Home Care and Equipment/Supplies: Were home health services ordered? not applicable If so, what is the name of the agency? N/a  Has the agency set up a time to come to the patient's home? not applicable Were any new equipment or medical supplies ordered?  No What is the name of the medical supply agency? N/a Were you able to get the supplies/equipment? not applicable Do you have any questions related to the use of the equipment or supplies? No  Functional Questionnaire: (I = Independent and D = Dependent) ADLs: I  Bathing/Dressing- I  Meal Prep- I  Eating- I  Maintaining continence- I  Transferring/Ambulation- I  Managing Meds- I  Follow up appointments reviewed:  PCP Hospital f/u appt confirmed? Patient feels better decline ED f.u appt  Specialist Hospital f/u appt confirmed?patient was not referred to a specialist Are transportation arrangements needed? No  If their condition worsens, is the pt aware to call PCP or go to the Emergency Dept.? Yes Was the patient provided with contact information for the PCP's office or ED? Yes Was to pt encouraged to call back with questions or concerns? Yes

## 2022-05-10 DIAGNOSIS — Z6834 Body mass index (BMI) 34.0-34.9, adult: Secondary | ICD-10-CM | POA: Diagnosis not present

## 2022-05-10 DIAGNOSIS — R635 Abnormal weight gain: Secondary | ICD-10-CM | POA: Diagnosis not present

## 2022-05-24 ENCOUNTER — Telehealth: Payer: BC Managed Care – PPO | Admitting: Nurse Practitioner

## 2022-05-24 DIAGNOSIS — N3090 Cystitis, unspecified without hematuria: Secondary | ICD-10-CM | POA: Diagnosis not present

## 2022-05-24 MED ORDER — CEPHALEXIN 500 MG PO CAPS: 500.0000 mg | ORAL_CAPSULE | Freq: Two times a day (BID) | ORAL | 0 refills | Status: DC

## 2022-05-24 NOTE — Progress Notes (Signed)
Virtual Visit Consent   Ann Santos, you are scheduled for a virtual visit with a [facility] provider today. Just as with appointments in the office, your consent must be obtained to participate. Your consent will be active for this visit and any virtual visit you may have with one of our providers in the next 365 days. If you have a MyChart account, a copy of this consent can be sent to you electronically.  As this is a virtual visit, video technology does not allow for your provider to perform a traditional examination. This may limit your provider's ability to fully assess your condition. If your provider identifies any concerns that need to be evaluated in person or the need to arrange testing (such as labs, EKG, etc.), we will make arrangements to do so. Although advances in technology are sophisticated, we cannot ensure that it will always work on either your end or our end. If the connection with a video visit is poor, the visit may have to be switched to a telephone visit. With either a video or telephone visit, we are not always able to ensure that we have a secure connection.  By engaging in this virtual visit, you consent to the provision of healthcare and authorize for your insurance to be billed (if applicable) for the services provided during this visit. Depending on your insurance coverage, you may receive a charge related to this service.  I need to obtain your verbal consent now. Are you willing to proceed with your visit today? Ann Santos has provided verbal consent on 05/24/2022 for a virtual visit (video or telephone). Ann Schneiders, FNP  Date: 05/24/2022 4:54 PM  Virtual Visit via Video Note   I, Ann Santos, connected with  Ann Santos  ([id number], 1981/03/23) on 05/24/22 at  5:00 PM EST by a video-enabled telemedicine application and verified that I am speaking with the correct person using two identifiers.  Location: Patient: Virtual Visit Location Patient:  Home Provider: Virtual Visit Location Provider: Home Office   I discussed the limitations of evaluation and management by telemedicine and the availability of in person appointments. The patient expressed understanding and agreed to proceed.    History of Present Illness: Ann Santos is a 42 y.o. who identifies as a female who was assigned female at birth, and is being seen today for symptoms of increased urinary frequency  Symptom onset was 2 days ago Symptoms include increased urinary frequency, no pain- urgency for the past two days.  She had labs done in December kidney function normal, UA WNL  Denies fever N/V or back pain  Denies recurrent UTI or complicated history of UTI treatment  Denies kidney disease    Has an IUD no chance of pregnancy   Problems:  Patient Active Problem List   Diagnosis Date Noted   History of DVT (deep vein thrombosis) 08/29/2019   On continuous oral anticoagulation 08/29/2019   Morbid obesity ([facility]) 11/17/2014   Diverticular disease 18/29/9371   Umbilical hernia 69/67/8938    Allergies: No Known Allergies Medications:  Current Outpatient Medications:    acetaminophen (TYLENOL) 325 MG tablet, Take 650 mg by mouth every 6 (six) hours as needed for headache., Disp: , Rfl:    amoxicillin-clavulanate (AUGMENTIN) 875-125 MG tablet, Take 1 tablet by mouth 2 (two) times daily., Disp: 20 tablet, Rfl: 0   CALCIUM PO, Take 1 tablet daily by mouth., Disp: , Rfl:    Cyanocobalamin 1000 MCG SUBL, Place under the  tongue., Disp: , Rfl:    Liraglutide -Weight Management (SAXENDA) 18 MG/3ML SOPN, Inject 3 mg into the skin daily., Disp: 3 mL, Rfl: 7   Lisdexamfetamine Dimesylate (VYVANSE PO), Take by mouth. (Patient not taking: Reported on 08/24/2021), Disp: , Rfl:    LYSINE PO, Take 1 tablet by mouth daily., Disp: , Rfl:    Multiple Vitamins-Minerals (WOMENS MULTIVITAMIN) TABS, Take 1 tablet by mouth daily., Disp: , Rfl:    naproxen (NAPROSYN) 500 MG tablet, Take  1 tablet (500 mg total) by mouth 2 (two) times daily. (Patient not taking: Reported on 08/24/2021), Disp: 30 tablet, Rfl: 0   ondansetron (ZOFRAN) 4 MG tablet, Take 1 tablet (4 mg total) by mouth every 8 (eight) hours as needed for nausea or vomiting., Disp: 10 tablet, Rfl: 0   ondansetron (ZOFRAN-ODT) 4 MG disintegrating tablet, Take 1 tablet (4 mg total) by mouth every 8 (eight) hours as needed for nausea or vomiting., Disp: 20 tablet, Rfl: 0   oxyCODONE (ROXICODONE) 5 MG immediate release tablet, Take 1 tablet (5 mg total) by mouth every 6 (six) hours as needed for severe pain., Disp: 6 tablet, Rfl: 0   Probiotic Product (PROBIOTIC-10 PO), Probiotic, Disp: , Rfl:    sertraline (ZOLOFT) 100 MG tablet, TAKE 2 TABLETS BY MOUTH DAILY, Disp: 180 tablet, Rfl: 1   TRAZODONE HCL PO, Take by mouth., Disp: , Rfl:    valACYclovir (VALTREX) 500 MG tablet, TAKE 1 TABLET BY MOUTH TWICE A DAY, Disp: 20 tablet, Rfl: 5   VYVANSE 50 MG CHEW, Chew 1 tablet by mouth every morning., Disp: , Rfl:   Observations/Objective: Patient is well-developed, well-nourished in no acute distress.  Resting comfortably  at home.  Head is normocephalic, atraumatic.  No labored breathing.  Speech is clear and coherent with logical content.  Patient is alert and oriented at baseline.    Assessment and Plan: 1. Cystitis  - cephALEXin (KEFLEX) 500 MG capsule; Take 1 capsule (500 mg total) by mouth 2 (two) times daily for 7 days.  Dispense: 14 capsule; Refill: 0     If symptoms persist encouraged follow up with PCP for additional workup    Follow Up Instructions: I discussed the assessment and treatment plan with the patient. The patient was provided an opportunity to ask questions and all were answered. The patient agreed with the plan and demonstrated an understanding of the instructions.  A copy of instructions were sent to the patient via MyChart unless otherwise noted below.    The patient was advised to call back or  seek an in-person evaluation if the symptoms worsen or if the condition fails to improve as anticipated.  Time:  I spent 10 minutes with the patient via telehealth technology discussing the above problems/concerns.    Ann Schneiders, FNP

## 2022-05-29 ENCOUNTER — Other Ambulatory Visit: Payer: Self-pay | Admitting: Nurse Practitioner

## 2022-05-29 DIAGNOSIS — N3090 Cystitis, unspecified without hematuria: Secondary | ICD-10-CM

## 2022-05-30 MED ORDER — CEPHALEXIN 500 MG PO CAPS: 500.0000 mg | ORAL_CAPSULE | Freq: Two times a day (BID) | ORAL | 0 refills | Status: AC

## 2022-06-02 ENCOUNTER — Other Ambulatory Visit: Payer: Self-pay | Admitting: Emergency Medicine

## 2022-06-02 ENCOUNTER — Encounter: Payer: Self-pay | Admitting: Emergency Medicine

## 2022-06-02 MED ORDER — FLUCONAZOLE 150 MG PO TABS: ORAL_TABLET | ORAL | 1 refills | Status: DC

## 2022-06-02 NOTE — Telephone Encounter (Signed)
New prescription for Diflucan sent to pharmacy of record.  Thanks.

## 2022-06-15 ENCOUNTER — Ambulatory Visit
Admission: EM | Admit: 2022-06-15 | Discharge: 2022-06-15 | Disposition: A | Discharge status: Home / Self Care | Payer: BC Managed Care – PPO | Attending: Urgent Care | Admitting: Urgent Care

## 2022-06-15 DIAGNOSIS — R519 Headache, unspecified: Secondary | ICD-10-CM

## 2022-06-15 DIAGNOSIS — G44209 Tension-type headache, unspecified, not intractable: Secondary | ICD-10-CM

## 2022-06-15 DIAGNOSIS — J309 Allergic rhinitis, unspecified: Secondary | ICD-10-CM

## 2022-06-15 DIAGNOSIS — R350 Frequency of micturition: Secondary | ICD-10-CM | POA: Diagnosis not present

## 2022-06-15 LAB — POCT URINALYSIS DIP (MANUAL ENTRY)
Bilirubin, UA: NEGATIVE
Blood, UA: NEGATIVE
Glucose, UA: NEGATIVE mg/dL
Ketones, POC UA: NEGATIVE mg/dL
Leukocytes, UA: NEGATIVE
Nitrite, UA: NEGATIVE
Protein Ur, POC: NEGATIVE mg/dL
Spec Grav, UA: >=1.03 — AB (ref 1.010–1.025)
Urobilinogen, UA: 0.2 E.U./dL
pH, UA: 6 (ref 5.0–8.0)

## 2022-06-15 LAB — POCT INFLUENZA A/B
Influenza A, POC: NEGATIVE
Influenza B, POC: NEGATIVE

## 2022-06-15 MED ORDER — PREDNISONE 20 MG PO TABS: ORAL_TABLET | ORAL | 0 refills | Status: DC

## 2022-06-15 MED ORDER — AMOXICILLIN-POT CLAVULANATE 875-125 MG PO TABS: 1.0000 | ORAL_TABLET | Freq: Two times a day (BID) | ORAL | 0 refills | Status: DC

## 2022-06-15 MED ORDER — FLUCONAZOLE 150 MG PO TABS: 150.0000 mg | ORAL_TABLET | ORAL | 0 refills | Status: DC

## 2022-06-15 NOTE — ED Provider Notes (Signed)
[facility]  Note:  This document was prepared using Systems analyst and may include unintentional dictation errors.  MRN: [id number] DOB: 21-Nov-1980  Subjective:   Ann Santos is a 42 y.o. female presenting for 4-day history of fatigue, body aches, sinus headaches, changes to her voice, slight congestion, frequency.  Has also had neck pain, neck stiffness.  Completed a course of cephalexin for cystitis, and fluconazole for yeast infection toward the beginning of February.  No chest pain, shortness of breath or wheezing.  No history of asthma.  Took Augmentin in December 2023 for respiratory infection.  Has significant allergic rhinitis, takes Zyrtec and pseudoephedrine every day.  No chest pain, shortness of breath or wheezing.  No current facility-administered medications for this encounter.  Current Outpatient Medications:    acetaminophen (TYLENOL) 325 MG tablet, Take 650 mg by mouth every 6 (six) hours as needed for headache., Disp: , Rfl:    amoxicillin-clavulanate (AUGMENTIN) 875-125 MG tablet, Take 1 tablet by mouth 2 (two) times daily., Disp: 20 tablet, Rfl: 0   CALCIUM PO, Take 1 tablet daily by mouth., Disp: , Rfl:    Cyanocobalamin 1000 MCG SUBL, Place under the tongue., Disp: , Rfl:    fluconazole (DIFLUCAN) 150 MG tablet, Sig one (1) tablet daily for 3 days., Disp: 3 tablet, Rfl: 1   Liraglutide -Weight Management (SAXENDA) 18 MG/3ML SOPN, Inject 3 mg into the skin daily., Disp: 3 mL, Rfl: 7   Lisdexamfetamine Dimesylate (VYVANSE PO), Take by mouth. (Patient not taking: Reported on 08/24/2021), Disp: , Rfl:    LYSINE PO, Take 1 tablet by mouth daily., Disp: , Rfl:    Multiple Vitamins-Minerals (WOMENS MULTIVITAMIN) TABS, Take 1 tablet by mouth daily., Disp: , Rfl:    naproxen (NAPROSYN) 500 MG tablet, Take 1 tablet (500 mg total) by mouth 2 (two) times daily. (Patient not taking: Reported on 08/24/2021), Disp: 30 tablet, Rfl: 0    ondansetron (ZOFRAN) 4 MG tablet, Take 1 tablet (4 mg total) by mouth every 8 (eight) hours as needed for nausea or vomiting., Disp: 10 tablet, Rfl: 0   ondansetron (ZOFRAN-ODT) 4 MG disintegrating tablet, Take 1 tablet (4 mg total) by mouth every 8 (eight) hours as needed for nausea or vomiting., Disp: 20 tablet, Rfl: 0   oxyCODONE (ROXICODONE) 5 MG immediate release tablet, Take 1 tablet (5 mg total) by mouth every 6 (six) hours as needed for severe pain., Disp: 6 tablet, Rfl: 0   Probiotic Product (PROBIOTIC-10 PO), Probiotic, Disp: , Rfl:    sertraline (ZOLOFT) 100 MG tablet, TAKE 2 TABLETS BY MOUTH DAILY, Disp: 180 tablet, Rfl: 1   TRAZODONE HCL PO, Take by mouth., Disp: , Rfl:    valACYclovir (VALTREX) 500 MG tablet, TAKE 1 TABLET BY MOUTH TWICE A DAY, Disp: 20 tablet, Rfl: 5   VYVANSE 50 MG CHEW, Chew 1 tablet by mouth every morning., Disp: , Rfl:    No Known Allergies  Past Medical History:  Diagnosis Date   Abdominal distention    Abdominal pain    Anxiety    Blood in stool    Constipation    Diverticulitis    Per pt. States she never had CT scan to confirm this   Diverticulosis    PT H[facility]IZED AT [facility] October 03, 2011   Gestational hypertension 07/05/2013   Gestational hypertension 07/05/2013   History of chicken pox    childhood   History of hiatal hernia  Hypertension    During pregnancy only   Maternal obesity, antepartum 07/05/2013   Maternal obesity, antepartum 07/05/2013   Numbness    first two fingers right hand    Rectal bleeding    Shortness of breath dyspnea    with exercise   Umbilical hernia    planning surgical repair     Past Surgical History:  Procedure Laterality Date   ABDOMINOPLASTY     w/ hernia revision 2020 @ [facility] N/A 07/22/2014   Procedure: BREATH TEK H PYLORI;  Surgeon: Alphonsa Overall, MD;  Location: [facility];  Service: General;  Laterality: N/A;   CESAREAN SECTION  05/28/2009   CESAREAN SECTION N/A 07/05/2013    Procedure: REPEAT CESAREAN SECTION (With Special Wound Vac);  Surgeon: Elveria Royals, MD;  Location: [facility];  Service: Obstetrics;  Laterality: N/A;   EDD: 07/23/13   COLONOSCOPY  10/07/2011   Procedure: COLONOSCOPY;  Surgeon: Beryle Beams, MD;  Location: [facility];  Service: Endoscopy;  Laterality: N/A;   ESOPHAGOGASTRODUODENOSCOPY  10/07/2011   Procedure: ESOPHAGOGASTRODUODENOSCOPY (EGD);  Surgeon: Beryle Beams, MD;  Location: [facility];  Service: Endoscopy;  Laterality: N/A;   HERNIA REPAIR     LAPAROSCOPIC GASTRIC SLEEVE RESECTION WITH HIATAL HERNIA REPAIR N/A 11/17/2014   Procedure: LAPAROSCOPIC GASTRIC SLEEVE RESECTION ;  Surgeon: Alphonsa Overall, MD;  Location: [facility];  Service: General;  Laterality: N/A;   SCAR REVISION  09/17/2009   c-section scar revision   UMBILICAL HERNIA REPAIR  11/01/2011   Procedure: HERNIA REPAIR UMBILICAL ADULT;  Surgeon: Earnstine Regal, MD;  Location: [facility];  Service: General;  Laterality: N/A;    Family History  Problem Relation Age of Onset   Diabetes Mother    Hypertension Mother    Ulcerative colitis Father    Hypertension Father    Cancer Maternal Grandmother        lung    Social History   Tobacco Use   Smoking status: Never   Smokeless tobacco: Never  Vaping Use   Vaping Use: Never used  Substance Use Topics   Alcohol use: Yes    Comment: occasionally   Drug use: No    ROS   Objective:   Vitals: BP (!) 149/90 (BP Location: Right Arm)   Pulse 88   Temp 99.4 F (37.4 C) (Oral)   Resp 18   SpO2 97%   Physical Exam Constitutional:      General: She is not in acute distress.    Appearance: Normal appearance. She is well-developed and normal weight. She is not ill-appearing, toxic-appearing or diaphoretic.  HENT:     Head: Normocephalic and atraumatic.     Right Ear: Tympanic membrane, ear canal and external ear normal. No drainage or tenderness. No middle ear effusion. There is no impacted cerumen. Tympanic membrane  is not erythematous or bulging.     Left Ear: Tympanic membrane, ear canal and external ear normal. No drainage or tenderness.  No middle ear effusion. There is no impacted cerumen. Tympanic membrane is not erythematous or bulging.     Nose: Nose normal. No congestion or rhinorrhea.     Mouth/Throat:     Mouth: Mucous membranes are moist. No oral lesions.     Pharynx: No pharyngeal swelling, oropharyngeal exudate, posterior oropharyngeal erythema or uvula swelling.     Tonsils: No tonsillar exudate or tonsillar abscesses.  Eyes:     General: No scleral icterus.  Right eye: No discharge.        Left eye: No discharge.     Extraocular Movements: Extraocular movements intact.     Right eye: Normal extraocular motion.     Left eye: Normal extraocular motion.     Conjunctiva/sclera: Conjunctivae normal.  Neck:     Meningeal: Brudzinski's sign and Kernig's sign absent.  Cardiovascular:     Rate and Rhythm: Normal rate and regular rhythm.     Heart sounds: Normal heart sounds. No murmur heard.    No friction rub. No gallop.  Pulmonary:     Effort: Pulmonary effort is normal. No respiratory distress.     Breath sounds: No stridor. No wheezing, rhonchi or rales.  Chest:     Chest wall: No tenderness.  Abdominal:     General: Bowel sounds are normal. There is no distension.     Palpations: Abdomen is soft. There is no mass.     Tenderness: There is no abdominal tenderness. There is no right CVA tenderness, left CVA tenderness, guarding or rebound.  Musculoskeletal:     Cervical back: Normal range of motion and neck supple.  Lymphadenopathy:     Cervical: No cervical adenopathy.  Skin:    General: Skin is warm and dry.  Neurological:     General: No focal deficit present.     Mental Status: She is alert and oriented to person, place, and time.     Cranial Nerves: No cranial nerve deficit.     Motor: No weakness.     Coordination: Coordination normal.     Gait: Gait normal.   Psychiatric:        Mood and Affect: Mood normal.        Behavior: Behavior normal.        Thought Content: Thought content normal.        Judgment: Judgment normal.    Results for orders placed or performed during the hospital encounter of 06/15/22 (from the past 24 hour(s))  POCT urinalysis dipstick     Status: Abnormal   Collection Time: 06/15/22  7:42 PM  Result Value Ref Range   Color, UA yellow yellow   Clarity, UA clear clear   Glucose, UA negative negative mg/dL   Bilirubin, UA negative negative   Ketones, POC UA negative negative mg/dL   Spec Grav, UA >=1.030 (A) 1.010 - 1.025   Blood, UA negative negative   pH, UA 6.0 5.0 - 8.0   Protein Ur, POC negative negative mg/dL   Urobilinogen, UA 0.2 0.2 or 1.0 E.U./dL   Nitrite, UA Negative Negative   Leukocytes, UA Negative Negative  POCT Influenza A/B     Status: None   Collection Time: 06/15/22  7:43 PM  Result Value Ref Range   Influenza A, POC Negative Negative   Influenza B, POC Negative Negative    Assessment and Plan :   PDMP not reviewed this encounter.  1. Sinus headache   2. Allergic rhinitis, unspecified seasonality, unspecified trigger   3. Urinary frequency   4. Tension headache     No signs of an acute encephalopathy.  Recommended an oral prednisone course for an allergic rhinitis flare, possible viral respiratory infection exacerbating her allergic rhinitis.  Emphasized need to hydrate much better.  Low suspicion for recurrent UTI.  Recommend she hold pseudoephedrine.  Advised supportive care otherwise.  If patient experiences no improvement in 3 to 4 days, can fill a prescription for Augmentin to address secondary bacterial sinusitis.  Maintain strict ER precautions. Counseled patient on potential for adverse effects with medications prescribed today, patient verbalized understanding.    Jaynee Eagles, [place] 06/16/22 [phone]

## 2022-06-15 NOTE — ED Triage Notes (Signed)
Pt c/o fatigue, body aches, HA, neck pain x 4 days-also c/o increase in UO-recent UTI/completed abx-NAD-steady gait

## 2022-06-17 ENCOUNTER — Other Ambulatory Visit: Payer: Self-pay

## 2022-06-17 ENCOUNTER — Ambulatory Visit: Payer: BC Managed Care – PPO | Admitting: Family Medicine

## 2022-06-17 ENCOUNTER — Encounter: Payer: Self-pay | Admitting: Emergency Medicine

## 2022-06-17 VITALS — BP 138/78 | HR 92 | Temp 98.4°F | Ht 62.0 in | Wt 191.0 lb

## 2022-06-17 DIAGNOSIS — M542 Cervicalgia: Secondary | ICD-10-CM

## 2022-06-17 DIAGNOSIS — Z1159 Encounter for screening for other viral diseases: Secondary | ICD-10-CM

## 2022-06-17 DIAGNOSIS — J0100 Acute maxillary sinusitis, unspecified: Secondary | ICD-10-CM

## 2022-06-17 DIAGNOSIS — R300 Dysuria: Secondary | ICD-10-CM | POA: Diagnosis not present

## 2022-06-17 LAB — POCT URINALYSIS DIPSTICK
Bilirubin, UA: NEGATIVE
Blood, UA: NEGATIVE
Glucose, UA: NEGATIVE
Ketones, UA: NEGATIVE
Leukocytes, UA: NEGATIVE
Nitrite, UA: NEGATIVE
Protein, UA: NEGATIVE
Spec Grav, UA: 1.02 (ref 1.010–1.025)
Urobilinogen, UA: 0.2 E.U./dL
pH, UA: 7 (ref 5.0–8.0)

## 2022-06-17 LAB — POC COVID19 BINAXNOW: SARS Coronavirus 2 Ag: NEGATIVE

## 2022-06-17 LAB — POCT INFLUENZA A/B
Influenza A, POC: NEGATIVE
Influenza B, POC: NEGATIVE

## 2022-06-17 NOTE — Progress Notes (Signed)
Acute Office Visit   Subjective:  Patient ID: Ann Santos, female    DOB: [birth year], 42 y.o.   MRN: [id number]  Chief Complaint  Patient presents with   Urinary Frequency    Pt notes she has had some more frequent urination but notes some headaches and fatigue, hot and cold notes 99.5 but she is normally low, 1 week     Urinary Frequency  Associated symptoms include frequency.   Patient was seen on 02/21 at [facility] for a 4 day history of fatigue, body aches, sinus headaches, changes to her voice, slight congestion, neck pain and stiffness. Negative for POCT influenza. POCT urinalysis only showed abnormal specific gravity. Prescribed Prednisone, Augmentin, and Fluconazole, for possible yeast infection from antibiotic. She reports she was told to start the Prednisone and hold on the Augmentin and Fluconazole until Saturday if she still had symptoms.  However, she didn't take any of the Prednisone. She filled the Augmentin and Fluconazole. She has took 3 doses of the Augmentin and one dose of the Fluconazole.   She reports she has done 3-4 covid test at home with negative results. Last test was on 02/21.   She reports she is still having occipital and frontal headache, neck pain with stiffness, chills, hot/cold flashes, increased in frequency and dysuria, nausea, and fatigue. She reports she does have Zofran, but has not took any of it because it causes constipation. Fatigue has got a little better. Also, reports she is shaky.   Denies any ear pain, sore throat, or cough. Denies injury to her neck.   ROS See HPI above     Objective:    BP 138/78   Pulse 92   Temp 98.4 F (36.9 C) (Temporal)   Ht '5\' 2"'$  (1.575 m)   Wt 191 lb (86.6 kg)   SpO2 100%   BMI 34.93 kg/m    Physical Exam Vitals reviewed.  Constitutional:      General: She is not in acute distress.    Appearance: Normal appearance. She is obese. She is not ill-appearing or toxic-appearing.  HENT:      Head: Normocephalic and atraumatic.     Right Ear: Tympanic membrane, ear canal and external ear normal. There is no impacted cerumen.     Left Ear: Tympanic membrane, ear canal and external ear normal. There is no impacted cerumen.     Nose:     Right Sinus: Maxillary sinus tenderness and frontal sinus tenderness present.     Left Sinus: Maxillary sinus tenderness and frontal sinus tenderness present.     Comments: Maxillary seem more tender than frontal.     Mouth/Throat:     Pharynx: Oropharynx is clear. No oropharyngeal exudate or posterior oropharyngeal erythema.  Eyes:     General:        Right eye: No discharge.        Left eye: No discharge.     Conjunctiva/sclera: Conjunctivae normal.  Cardiovascular:     Rate and Rhythm: Normal rate and regular rhythm.     Heart sounds: Normal heart sounds. No murmur heard.    No friction rub. No gallop.  Pulmonary:     Effort: Pulmonary effort is normal. No respiratory distress.     Breath sounds: Normal breath sounds.  Abdominal:     General: Bowel sounds are normal.     Palpations: Abdomen is soft. There is no mass.     Tenderness: There is no abdominal  tenderness. There is no right CVA tenderness or left CVA tenderness.  Musculoskeletal:        General: Normal range of motion.     Cervical back: Normal range of motion and neck supple. Tenderness present. No rigidity.  Skin:    General: Skin is warm and dry.  Neurological:     General: No focal deficit present.     Mental Status: She is alert and oriented to person, place, and time. Mental status is at baseline.  Psychiatric:        Mood and Affect: Mood normal.        Behavior: Behavior normal.        Thought Content: Thought content normal.        Judgment: Judgment normal.     Results for orders placed or performed in visit on 06/17/22  POCT Urinalysis Dipstick  Result Value Ref Range   Color, UA straw    Clarity, UA clear    Glucose, UA Negative Negative    Bilirubin, UA negative    Ketones, UA negative    Spec Grav, UA 1.020 1.010 - 1.025   Blood, UA negative    pH, UA 7.0 5.0 - 8.0   Protein, UA Negative Negative   Urobilinogen, UA 0.2 0.2 or 1.0 E.U./dL   Nitrite, UA negative    Leukocytes, UA Negative Negative   Appearance     Odor    POCT Influenza A/B  Result Value Ref Range   Influenza A, POC Negative Negative   Influenza B, POC Negative Negative  POC COVID-19  Result Value Ref Range   SARS Coronavirus 2 Ag Negative Negative        Assessment & Plan:  Dysuria -     POCT urinalysis dipstick -     Urine Culture -     CBC with Differential/Platelet  Screening for viral disease -     POCT Influenza A/B -     POC COVID-19 BinaxNow  Acute maxillary sinusitis, recurrence not specified -     CBC with Differential/Platelet  Neck pain  -Urinalysis was negative in office. Will send for culture with having dysuria and frequency in urinating. Office will call with lab results.  -Negative for POCT influenza and covid.  -Ordered CBC due to symptoms. Office will call with results.  -Recommend to increase fluid intake to 60-100oz of water a day.  -Recommend to take full course of Augmentin and Prednisone that was prescribed on 02/21 at [facility] at [facility] to see if this helps your symptoms. Recommend to eat something, like a little snack, with taking the medication. If not, recommend to follow up.  -If symptoms become worse over the weekend, recommend to go the closes emergency department.  -Recommend to take Zofran for nausea. Patient reports has medication at home.   No follow-ups on file.  Valarie Merino, NP

## 2022-06-17 NOTE — Patient Instructions (Addendum)
It was a pleasure taking care of you today! -Urinalysis was negative in office. Will send for culture with having dysuria and frequency in urinating. Office will call with lab results.  -Negative for influenza and covid.  -Ordered CBC due to symptoms. Office will call with results.  -Recommend to increase fluid intake to 60-100oz of water a day.  -Recommend to take full course of Augmentin and Prednisone to see if this helps your symptoms. Recommend to eat something, like a little snack, with taking the medication.  If not, recommend to follow up.  -If symptoms become worse over the weekend, recommend to go the closes emergency department.  -Recommend to take Zofran for nausea.

## 2022-06-18 LAB — CBC WITH DIFFERENTIAL/PLATELET
Absolute Monocytes: 540 cells/uL (ref 200–950)
Basophils Absolute: 72 cells/uL (ref 0–200)
Basophils Relative: 0.8 %
Eosinophils Absolute: 54 cells/uL (ref 15–500)
Eosinophils Relative: 0.6 %
HCT: 44.6 % (ref 35.0–45.0)
Hemoglobin: 15.7 g/dL — ABNORMAL HIGH (ref 11.7–15.5)
Lymphs Abs: 2304 cells/uL (ref 850–3900)
MCH: 31.3 pg (ref 27.0–33.0)
MCHC: 35.2 g/dL (ref 32.0–36.0)
MCV: 88.8 fL (ref 80.0–100.0)
MPV: 8.9 fL (ref 7.5–12.5)
Monocytes Relative: 6 %
Neutro Abs: 6030 cells/uL (ref 1500–7800)
Neutrophils Relative %: 67 %
Platelets: 374 10*3/uL (ref 140–400)
RBC: 5.02 10*6/uL (ref 3.80–5.10)
RDW: 11.6 % (ref 11.0–15.0)
Total Lymphocyte: 25.6 %
WBC: 9 10*3/uL (ref 3.8–10.8)

## 2022-06-19 LAB — URINE CULTURE
MICRO NUMBER:: 14607618
Result:: NO GROWTH
SPECIMEN QUALITY:: ADEQUATE

## 2022-06-20 ENCOUNTER — Telehealth: Payer: Self-pay

## 2022-06-20 NOTE — Telephone Encounter (Signed)
-----   Message from Evangeline Gula, NP sent at 06/20/2022  4:56 AM EST ----- CBC is stable. No reason for symptoms.

## 2022-06-20 NOTE — Telephone Encounter (Signed)
Left results on pt VM

## 2022-07-12 DIAGNOSIS — F900 Attention-deficit hyperactivity disorder, predominantly inattentive type: Secondary | ICD-10-CM | POA: Diagnosis not present

## 2022-07-12 DIAGNOSIS — F411 Generalized anxiety disorder: Secondary | ICD-10-CM | POA: Diagnosis not present

## 2022-07-12 DIAGNOSIS — F349 Persistent mood [affective] disorder, unspecified: Secondary | ICD-10-CM | POA: Diagnosis not present

## 2022-07-19 DIAGNOSIS — F4323 Adjustment disorder with mixed anxiety and depressed mood: Secondary | ICD-10-CM | POA: Diagnosis not present

## 2022-07-28 DIAGNOSIS — F4323 Adjustment disorder with mixed anxiety and depressed mood: Secondary | ICD-10-CM | POA: Diagnosis not present

## 2022-08-04 DIAGNOSIS — F4323 Adjustment disorder with mixed anxiety and depressed mood: Secondary | ICD-10-CM | POA: Diagnosis not present

## 2022-08-05 ENCOUNTER — Other Ambulatory Visit: Payer: Self-pay | Admitting: Emergency Medicine

## 2022-08-11 DIAGNOSIS — F4323 Adjustment disorder with mixed anxiety and depressed mood: Secondary | ICD-10-CM | POA: Diagnosis not present

## 2022-08-25 DIAGNOSIS — F4323 Adjustment disorder with mixed anxiety and depressed mood: Secondary | ICD-10-CM | POA: Diagnosis not present

## 2022-08-30 DIAGNOSIS — F4323 Adjustment disorder with mixed anxiety and depressed mood: Secondary | ICD-10-CM | POA: Diagnosis not present

## 2022-09-08 DIAGNOSIS — F4323 Adjustment disorder with mixed anxiety and depressed mood: Secondary | ICD-10-CM | POA: Diagnosis not present

## 2022-09-15 DIAGNOSIS — F4323 Adjustment disorder with mixed anxiety and depressed mood: Secondary | ICD-10-CM | POA: Diagnosis not present

## 2022-09-22 DIAGNOSIS — F4323 Adjustment disorder with mixed anxiety and depressed mood: Secondary | ICD-10-CM | POA: Diagnosis not present

## 2022-09-29 DIAGNOSIS — F4323 Adjustment disorder with mixed anxiety and depressed mood: Secondary | ICD-10-CM | POA: Diagnosis not present

## 2022-10-06 DIAGNOSIS — F4323 Adjustment disorder with mixed anxiety and depressed mood: Secondary | ICD-10-CM | POA: Diagnosis not present

## 2022-10-20 DIAGNOSIS — F4323 Adjustment disorder with mixed anxiety and depressed mood: Secondary | ICD-10-CM | POA: Diagnosis not present

## 2022-10-26 DIAGNOSIS — F4323 Adjustment disorder with mixed anxiety and depressed mood: Secondary | ICD-10-CM | POA: Diagnosis not present

## 2022-10-28 DIAGNOSIS — Z131 Encounter for screening for diabetes mellitus: Secondary | ICD-10-CM | POA: Diagnosis not present

## 2022-10-28 DIAGNOSIS — E669 Obesity, unspecified: Secondary | ICD-10-CM | POA: Diagnosis not present

## 2022-10-28 DIAGNOSIS — Z13220 Encounter for screening for lipoid disorders: Secondary | ICD-10-CM | POA: Diagnosis not present

## 2022-10-28 DIAGNOSIS — Z903 Acquired absence of stomach [part of]: Secondary | ICD-10-CM | POA: Diagnosis not present

## 2022-11-03 DIAGNOSIS — F4323 Adjustment disorder with mixed anxiety and depressed mood: Secondary | ICD-10-CM | POA: Diagnosis not present

## 2022-11-10 DIAGNOSIS — F4323 Adjustment disorder with mixed anxiety and depressed mood: Secondary | ICD-10-CM | POA: Diagnosis not present

## 2022-11-24 DIAGNOSIS — F4323 Adjustment disorder with mixed anxiety and depressed mood: Secondary | ICD-10-CM | POA: Diagnosis not present

## 2022-12-15 DIAGNOSIS — F4323 Adjustment disorder with mixed anxiety and depressed mood: Secondary | ICD-10-CM | POA: Diagnosis not present

## 2022-12-25 ENCOUNTER — Telehealth: Payer: BC Managed Care – PPO | Admitting: Emergency Medicine

## 2022-12-25 DIAGNOSIS — K5792 Diverticulitis of intestine, part unspecified, without perforation or abscess without bleeding: Secondary | ICD-10-CM

## 2022-12-25 MED ORDER — FLUCONAZOLE 150 MG PO TABS: 150.0000 mg | ORAL_TABLET | ORAL | 0 refills | Status: DC

## 2022-12-25 MED ORDER — METRONIDAZOLE 500 MG PO TABS: 500.0000 mg | ORAL_TABLET | Freq: Two times a day (BID) | ORAL | 0 refills | Status: AC

## 2022-12-25 MED ORDER — CIPROFLOXACIN HCL 500 MG PO TABS: 500.0000 mg | ORAL_TABLET | Freq: Two times a day (BID) | ORAL | 0 refills | Status: AC

## 2022-12-25 NOTE — Patient Instructions (Signed)
Ann Santos, thank you for joining Cathlyn Parsons, NP for today's virtual visit.  While this provider is not your primary care provider (PCP), if your PCP is located in our provider database this encounter information will be shared with them immediately following your visit.   A [facility] MyChart account gives you access to today's visit and all your visits, tests, and labs performed at [facility] " click here if you don't have a [facility] MyChart account or go to [email or web address]  Consent: (Patient) Ann Santos provided verbal consent for this virtual visit at the beginning of the encounter.  Current Medications:  Current Outpatient Medications:    ciprofloxacin (CIPRO) 500 MG tablet, Take 1 tablet (500 mg total) by mouth 2 (two) times daily for 7 days., Disp: 14 tablet, Rfl: 0   metroNIDAZOLE (FLAGYL) 500 MG tablet, Take 1 tablet (500 mg total) by mouth 2 (two) times daily for 7 days., Disp: 14 tablet, Rfl: 0   acetaminophen (TYLENOL) 325 MG tablet, Take 650 mg by mouth every 6 (six) hours as needed for headache., Disp: , Rfl:    CALCIUM PO, Take 1 tablet daily by mouth., Disp: , Rfl:    Cyanocobalamin 1000 MCG SUBL, Place under the tongue., Disp: , Rfl:    fluconazole (DIFLUCAN) 150 MG tablet, Take 1 tablet (150 mg total) by mouth once a week., Disp: 2 tablet, Rfl: 0   Liraglutide -Weight Management (SAXENDA) 18 MG/3ML SOPN, Inject 3 mg into the skin daily., Disp: 3 mL, Rfl: 7   Lisdexamfetamine Dimesylate (VYVANSE PO), Take by mouth. (Patient not taking: Reported on 08/24/2021), Disp: , Rfl:    LYSINE PO, Take 1 tablet by mouth daily., Disp: , Rfl:    Multiple Vitamins-Minerals (WOMENS MULTIVITAMIN) TABS, Take 1 tablet by mouth daily., Disp: , Rfl:    naproxen (NAPROSYN) 500 MG tablet, Take 1 tablet (500 mg total) by mouth 2 (two) times daily. (Patient not taking: Reported on 08/24/2021), Disp: 30 tablet, Rfl: 0   ondansetron (ZOFRAN) 4 MG tablet, Take 1 tablet  (4 mg total) by mouth every 8 (eight) hours as needed for nausea or vomiting., Disp: 10 tablet, Rfl: 0   ondansetron (ZOFRAN-ODT) 4 MG disintegrating tablet, Take 1 tablet (4 mg total) by mouth every 8 (eight) hours as needed for nausea or vomiting., Disp: 20 tablet, Rfl: 0   oxyCODONE (ROXICODONE) 5 MG immediate release tablet, Take 1 tablet (5 mg total) by mouth every 6 (six) hours as needed for severe pain., Disp: 6 tablet, Rfl: 0   predniSONE (DELTASONE) 20 MG tablet, Take 2 tablets daily with breakfast., Disp: 10 tablet, Rfl: 0   Probiotic Product (PROBIOTIC-10 PO), Probiotic, Disp: , Rfl:    sertraline (ZOLOFT) 100 MG tablet, TAKE 2 TABLETS BY MOUTH DAILY, Disp: 180 tablet, Rfl: 1   TRAZODONE HCL PO, Take by mouth., Disp: , Rfl:    valACYclovir (VALTREX) 500 MG tablet, TAKE 1 TABLET BY MOUTH TWICE A DAY, Disp: 20 tablet, Rfl: 5   VYVANSE 50 MG CHEW, Chew 1 tablet by mouth every morning., Disp: , Rfl:    Medications ordered in this encounter:  Meds ordered this encounter  Medications   ciprofloxacin (CIPRO) 500 MG tablet    Sig: Take 1 tablet (500 mg total) by mouth 2 (two) times daily for 7 days.    Dispense:  14 tablet    Refill:  0   metroNIDAZOLE (FLAGYL) 500 MG tablet    Sig: Take 1 tablet (  500 mg total) by mouth 2 (two) times daily for 7 days.    Dispense:  14 tablet    Refill:  0   fluconazole (DIFLUCAN) 150 MG tablet    Sig: Take 1 tablet (150 mg total) by mouth once a week.    Dispense:  2 tablet    Refill:  0     *If you need refills on other medications prior to your next appointment, please contact your pharmacy*  Follow-Up: Call back or seek an in-person evaluation if the symptoms worsen or if the condition fails to improve as anticipated.  [facility] [phone]  Other Instructions Seek help in the ER if you develop: Your pain becomes severe. You are not pooping like normal. Your symptoms do not get better. Your symptoms get worse very  fast. You have a fever. You vomit more than one time. You have poop that is: Bloody. Black. Tarry.   If you have been instructed to have an in-person evaluation today at a local Urgent Care facility, please use the link below. It will take you to a list of all of our available [facility], including address, phone number and hours of operation. Please do not delay care.  [facility]  If you or a family member do not have a primary care provider, use the link below to schedule a visit and establish care. When you choose a [facility] primary care physician or advanced practice provider, you gain a long-term partner in health. Find a Primary Care Provider  Learn more about [facility]'s in-office and virtual care options: [facility] - Get Care Now

## 2022-12-25 NOTE — Progress Notes (Signed)
Virtual Visit Consent   Ann Santos, you are scheduled for a virtual visit with a [facility] provider today. Just as with appointments in the office, your consent must be obtained to participate. Your consent will be active for this visit and any virtual visit you may have with one of our providers in the next 365 days. If you have a MyChart account, a copy of this consent can be sent to you electronically.  As this is a virtual visit, video technology does not allow for your provider to perform a traditional examination. This may limit your provider's ability to fully assess your condition. If your provider identifies any concerns that need to be evaluated in person or the need to arrange testing (such as labs, EKG, etc.), we will make arrangements to do so. Although advances in technology are sophisticated, we cannot ensure that it will always work on either your end or our end. If the connection with a video visit is poor, the visit may have to be switched to a telephone visit. With either a video or telephone visit, we are not always able to ensure that we have a secure connection.  By engaging in this virtual visit, you consent to the provision of healthcare and authorize for your insurance to be billed (if applicable) for the services provided during this visit. Depending on your insurance coverage, you may receive a charge related to this service.  I need to obtain your verbal consent now. Are you willing to proceed with your visit today? Ann Santos has provided verbal consent on 12/25/2022 for a virtual visit (video or telephone). Cathlyn Parsons, NP  Date: 12/25/2022 1:31 PM  Virtual Visit via Video Note   I, Cathlyn Parsons, connected with  Ann Santos  ([id number], 10-11-1980) on 12/25/22 at  1:15 PM EDT by a video-enabled telemedicine application and verified that I am speaking with the correct person using two identifiers.  Location: Patient: Virtual Visit Location Patient: Other: a  parked car in [place] Provider: Virtual Visit Location Provider: Home Office   I discussed the limitations of evaluation and management by telemedicine and the availability of in person appointments. The patient expressed understanding and agreed to proceed.    History of Present Illness: Ann Santos is a 42 y.o. who identifies as a female who was assigned female at birth, and is being seen today for abd pain. Hx diverticulitis with frequent recurrent flares. REview of records shows last flare was in DEcember 2023. Pain is LLQ. Also has some diarrhea but no blood in stool. Belly is soft, not rigid. Some nausea in the evenings but no vomiting. Feels abd pressure that makes her feel like she might hae a UTI- this sx has happened before with diverticulitis. HAs had suymptoms for about a week before realizing today it's probably a diverticular flare again.   HPI: HPI  Problems:  Patient Active Problem List   Diagnosis Date Noted   History of DVT (deep vein thrombosis) 08/29/2019   On continuous oral anticoagulation 08/29/2019   Morbid obesity (HCC) 11/17/2014   Diverticular disease 10/23/2012   Umbilical hernia 10/26/2011    Allergies: No Known Allergies Medications:  Current Outpatient Medications:    ciprofloxacin (CIPRO) 500 MG tablet, Take 1 tablet (500 mg total) by mouth 2 (two) times daily for 7 days., Disp: 14 tablet, Rfl: 0   metroNIDAZOLE (FLAGYL) 500 MG tablet, Take 1 tablet (500 mg total) by mouth 2 (two) times daily  for 7 days., Disp: 14 tablet, Rfl: 0   acetaminophen (TYLENOL) 325 MG tablet, Take 650 mg by mouth every 6 (six) hours as needed for headache., Disp: , Rfl:    CALCIUM PO, Take 1 tablet daily by mouth., Disp: , Rfl:    Cyanocobalamin 1000 MCG SUBL, Place under the tongue., Disp: , Rfl:    fluconazole (DIFLUCAN) 150 MG tablet, Take 1 tablet (150 mg total) by mouth once a week., Disp: 2 tablet, Rfl: 0   Liraglutide -Weight Management (SAXENDA) 18 MG/3ML SOPN,  Inject 3 mg into the skin daily., Disp: 3 mL, Rfl: 7   Lisdexamfetamine Dimesylate (VYVANSE PO), Take by mouth. (Patient not taking: Reported on 08/24/2021), Disp: , Rfl:    LYSINE PO, Take 1 tablet by mouth daily., Disp: , Rfl:    Multiple Vitamins-Minerals (WOMENS MULTIVITAMIN) TABS, Take 1 tablet by mouth daily., Disp: , Rfl:    naproxen (NAPROSYN) 500 MG tablet, Take 1 tablet (500 mg total) by mouth 2 (two) times daily. (Patient not taking: Reported on 08/24/2021), Disp: 30 tablet, Rfl: 0   ondansetron (ZOFRAN) 4 MG tablet, Take 1 tablet (4 mg total) by mouth every 8 (eight) hours as needed for nausea or vomiting., Disp: 10 tablet, Rfl: 0   ondansetron (ZOFRAN-ODT) 4 MG disintegrating tablet, Take 1 tablet (4 mg total) by mouth every 8 (eight) hours as needed for nausea or vomiting., Disp: 20 tablet, Rfl: 0   oxyCODONE (ROXICODONE) 5 MG immediate release tablet, Take 1 tablet (5 mg total) by mouth every 6 (six) hours as needed for severe pain., Disp: 6 tablet, Rfl: 0   predniSONE (DELTASONE) 20 MG tablet, Take 2 tablets daily with breakfast., Disp: 10 tablet, Rfl: 0   Probiotic Product (PROBIOTIC-10 PO), Probiotic, Disp: , Rfl:    sertraline (ZOLOFT) 100 MG tablet, TAKE 2 TABLETS BY MOUTH DAILY, Disp: 180 tablet, Rfl: 1   TRAZODONE HCL PO, Take by mouth., Disp: , Rfl:    valACYclovir (VALTREX) 500 MG tablet, TAKE 1 TABLET BY MOUTH TWICE A DAY, Disp: 20 tablet, Rfl: 5   VYVANSE 50 MG CHEW, Chew 1 tablet by mouth every morning., Disp: , Rfl:   Observations/Objective: Patient is well-developed, well-nourished in no acute distress.  Resting comfortably in parked car in [place].  Head is normocephalic, atraumatic.  No labored breathing.  Speech is clear and coherent with logical content.  Patient is alert and oriented at baseline.    Assessment and Plan: 1. Diverticulitis  Discussed tx ptions. Pt feels combo of flagyl plus cipro works best for her. Has sufficient zofran at home. REquests diflucan  for yeast. Reviewed reasons for seeking ER care.   Follow Up Instructions: I discussed the assessment and treatment plan with the patient. The patient was provided an opportunity to ask questions and all were answered. The patient agreed with the plan and demonstrated an understanding of the instructions.  A copy of instructions were sent to the patient via MyChart unless otherwise noted below.   The patient was advised to call back or seek an in-person evaluation if the symptoms worsen or if the condition fails to improve as anticipated.  Time:  I spent 10 minutes with the patient via telehealth technology discussing the above problems/concerns.    Cathlyn Parsons, NP

## 2023-01-10 ENCOUNTER — Encounter: Payer: Self-pay | Admitting: Emergency Medicine

## 2023-01-11 ENCOUNTER — Other Ambulatory Visit: Payer: Self-pay | Admitting: Emergency Medicine

## 2023-01-11 MED ORDER — TRAZODONE HCL 100 MG PO TABS: 200.0000 mg | ORAL_TABLET | Freq: Every day | ORAL | 3 refills | Status: AC

## 2023-01-11 MED ORDER — SERTRALINE HCL 100 MG PO TABS: 200.0000 mg | ORAL_TABLET | Freq: Every day | ORAL | 1 refills | Status: DC

## 2023-01-11 MED ORDER — TRAZODONE HCL 50 MG PO TABS: 50.0000 mg | ORAL_TABLET | Freq: Every day | ORAL | 3 refills | Status: DC

## 2023-01-11 NOTE — Telephone Encounter (Signed)
New prescriptions sent to pharmacy of record today.  Thanks.

## 2023-01-13 DIAGNOSIS — F902 Attention-deficit hyperactivity disorder, combined type: Secondary | ICD-10-CM | POA: Diagnosis not present

## 2023-01-13 DIAGNOSIS — F411 Generalized anxiety disorder: Secondary | ICD-10-CM | POA: Diagnosis not present

## 2023-01-20 ENCOUNTER — Telehealth: Payer: BC Managed Care – PPO | Admitting: Nurse Practitioner

## 2023-01-20 DIAGNOSIS — J40 Bronchitis, not specified as acute or chronic: Secondary | ICD-10-CM | POA: Diagnosis not present

## 2023-01-20 DIAGNOSIS — J0140 Acute pansinusitis, unspecified: Secondary | ICD-10-CM | POA: Diagnosis not present

## 2023-01-20 MED ORDER — ALBUTEROL SULFATE HFA 108 (90 BASE) MCG/ACT IN AERS: 2.0000 | INHALATION_SPRAY | Freq: Four times a day (QID) | RESPIRATORY_TRACT | 0 refills | Status: DC | PRN

## 2023-01-20 MED ORDER — BENZONATATE 100 MG PO CAPS: 100.0000 mg | ORAL_CAPSULE | Freq: Three times a day (TID) | ORAL | 0 refills | Status: DC | PRN

## 2023-01-20 MED ORDER — DOXYCYCLINE HYCLATE 100 MG PO TABS: 100.0000 mg | ORAL_TABLET | Freq: Two times a day (BID) | ORAL | 0 refills | Status: AC

## 2023-01-20 NOTE — Progress Notes (Signed)
Virtual Visit Consent   Ann Santos, you are scheduled for a virtual visit with a [facility] provider today. Just as with appointments in the office, your consent must be obtained to participate. Your consent will be active for this visit and any virtual visit you may have with one of our providers in the next 365 days. If you have a MyChart account, a copy of this consent can be sent to you electronically.  As this is a virtual visit, video technology does not allow for your provider to perform a traditional examination. This may limit your provider's ability to fully assess your condition. If your provider identifies any concerns that need to be evaluated in person or the need to arrange testing (such as labs, EKG, etc.), we will make arrangements to do so. Although advances in technology are sophisticated, we cannot ensure that it will always work on either your end or our end. If the connection with a video visit is poor, the visit may have to be switched to a telephone visit. With either a video or telephone visit, we are not always able to ensure that we have a secure connection.  By engaging in this virtual visit, you consent to the provision of healthcare and authorize for your insurance to be billed (if applicable) for the services provided during this visit. Depending on your insurance coverage, you may receive a charge related to this service.  I need to obtain your verbal consent now. Are you willing to proceed with your visit today? Ann Santos has provided verbal consent on 01/20/2023 for a virtual visit (video or telephone). Viviano Simas, FNP  Date: 01/20/2023 8:37 AM  Virtual Visit via Video Note   I, Viviano Simas, connected with  Ann Santos  ([id number], 1981-03-02) on 01/20/23 at  8:45 AM EDT by a video-enabled telemedicine application and verified that I am speaking with the correct person using two identifiers.  Location: Patient: Virtual Visit Location Patient:  Home Provider: Virtual Visit Location Provider: Home Office   I discussed the limitations of evaluation and management by telemedicine and the availability of in person appointments. The patient expressed understanding and agreed to proceed.    CC:  "Congestion/Coughing/Loss of Voice, straining to talk/headache. I can't stop coughing and I think my cold or whatever has turned into something awful and I need antibiotics pls and some gold cough pills cause I'm miserable x12 days. I took sudafed/robitussin/mucinex/tylenol/nasal spray/zyrtec. My chest is itchy. 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 :) but really :( :( :( "   History of Present Illness: Ann Santos is a 42 y.o. who identifies as a female who was assigned female at birth, and is being seen today for 12 days of ongoing URI symptoms.   Symptom onset with nasal congestion and a mild cough, symptoms are typically worse at night  In the morning she has more of a productive cough  She has used multiple over the counter medications   Coughing is the worst symptom today and has been ongoing for 12 days now   She is able to eat and drink  She has started to have some nausea today     Problems:  Patient Active Problem List   Diagnosis Date Noted   History of DVT (deep vein thrombosis) 08/29/2019   On continuous oral anticoagulation 08/29/2019   Morbid obesity (HCC) 11/17/2014   Diverticular disease 10/23/2012  Umbilical hernia 10/26/2011    Allergies: No Known Allergies  Medications:  Current Outpatient Medications:    acetaminophen (TYLENOL) 325 MG tablet, Take 650 mg by mouth every 6 (six) hours as needed for headache., Disp: , Rfl:    CALCIUM PO, Take 1 tablet daily by mouth., Disp: , Rfl:    Cyanocobalamin 1000 MCG SUBL, Place under the tongue., Disp: , Rfl:    fluconazole (DIFLUCAN) 150 MG tablet, Take 1 tablet (150 mg total) by mouth once a week., Disp: 2  tablet, Rfl: 0   Liraglutide -Weight Management (SAXENDA) 18 MG/3ML SOPN, Inject 3 mg into the skin daily., Disp: 3 mL, Rfl: 7   Lisdexamfetamine Dimesylate (VYVANSE PO), Take by mouth. (Patient not taking: Reported on 08/24/2021), Disp: , Rfl:    LYSINE PO, Take 1 tablet by mouth daily., Disp: , Rfl:    Multiple Vitamins-Minerals (WOMENS MULTIVITAMIN) TABS, Take 1 tablet by mouth daily., Disp: , Rfl:    naproxen (NAPROSYN) 500 MG tablet, Take 1 tablet (500 mg total) by mouth 2 (two) times daily. (Patient not taking: Reported on 08/24/2021), Disp: 30 tablet, Rfl: 0   ondansetron (ZOFRAN) 4 MG tablet, Take 1 tablet (4 mg total) by mouth every 8 (eight) hours as needed for nausea or vomiting., Disp: 10 tablet, Rfl: 0   ondansetron (ZOFRAN-ODT) 4 MG disintegrating tablet, Take 1 tablet (4 mg total) by mouth every 8 (eight) hours as needed for nausea or vomiting., Disp: 20 tablet, Rfl: 0   oxyCODONE (ROXICODONE) 5 MG immediate release tablet, Take 1 tablet (5 mg total) by mouth every 6 (six) hours as needed for severe pain., Disp: 6 tablet, Rfl: 0   predniSONE (DELTASONE) 20 MG tablet, Take 2 tablets daily with breakfast., Disp: 10 tablet, Rfl: 0   Probiotic Product (PROBIOTIC-10 PO), Probiotic, Disp: , Rfl:    sertraline (ZOLOFT) 100 MG tablet, Take 2 tablets (200 mg total) by mouth daily., Disp: 180 tablet, Rfl: 1   traZODone (DESYREL) 100 MG tablet, Take 2 tablets (200 mg total) by mouth at bedtime., Disp: 180 tablet, Rfl: 3   valACYclovir (VALTREX) 500 MG tablet, TAKE 1 TABLET BY MOUTH TWICE A DAY, Disp: 20 tablet, Rfl: 5   VYVANSE 50 MG CHEW, Chew 1 tablet by mouth every morning., Disp: , Rfl:   Observations/Objective: Patient is well-developed, well-nourished in no acute distress.  Resting comfortably  at home.  Head is normocephalic, atraumatic.  No labored breathing.  Speech is clear and coherent with logical content.  Patient is alert and oriented at baseline.    Assessment and Plan:  1.  Bronchitis  - benzonatate (TESSALON) 100 MG capsule; Take 1 capsule (100 mg total) by mouth 3 (three) times daily as needed.  Dispense: 30 capsule; Refill: 0 - albuterol (VENTOLIN HFA) 108 (90 Base) MCG/ACT inhaler; Inhale 2 puffs into the lungs every 6 (six) hours as needed for wheezing or shortness of breath.  Dispense: 8 g; Refill: 0 - doxycycline (VIBRA-TABS) 100 MG tablet; Take 1 tablet (100 mg total) by mouth 2 (two) times daily for 10 days.  Dispense: 20 tablet; Refill: 0  2. Acute non-recurrent pansinusitis  - doxycycline (VIBRA-TABS) 100 MG tablet; Take 1 tablet (100 mg total) by mouth 2 (two) times daily for 10 days.  Dispense: 20 tablet; Refill: 0     Follow Up Instructions: I discussed the assessment and treatment plan with the patient. The patient was provided an opportunity to ask questions and all were answered. The patient agreed  with the plan and demonstrated an understanding of the instructions.  A copy of instructions were sent to the patient via MyChart unless otherwise noted below.    The patient was advised to call back or seek an in-person evaluation if the symptoms worsen or if the condition fails to improve as anticipated.  Time:  I spent 12 minutes with the patient via telehealth technology discussing the above problems/concerns.    Viviano Simas, FNP

## 2023-02-11 ENCOUNTER — Other Ambulatory Visit: Payer: Self-pay | Admitting: Nurse Practitioner

## 2023-02-11 DIAGNOSIS — J40 Bronchitis, not specified as acute or chronic: Secondary | ICD-10-CM

## 2023-02-14 DIAGNOSIS — F900 Attention-deficit hyperactivity disorder, predominantly inattentive type: Secondary | ICD-10-CM | POA: Diagnosis not present

## 2023-02-14 DIAGNOSIS — F411 Generalized anxiety disorder: Secondary | ICD-10-CM | POA: Diagnosis not present

## 2023-02-14 DIAGNOSIS — F331 Major depressive disorder, recurrent, moderate: Secondary | ICD-10-CM | POA: Diagnosis not present

## 2023-02-22 ENCOUNTER — Ambulatory Visit
Admission: RE | Admit: 2023-02-22 | Discharge: 2023-02-22 | Disposition: A | Discharge status: Home / Self Care | Payer: BC Managed Care – PPO | Source: Ambulatory Visit

## 2023-02-22 VITALS — BP 154/92 | HR 89 | Temp 99.9°F | Resp 16

## 2023-02-22 DIAGNOSIS — J069 Acute upper respiratory infection, unspecified: Secondary | ICD-10-CM | POA: Diagnosis not present

## 2023-02-22 MED ORDER — PREDNISONE 10 MG (21) PO TBPK: ORAL_TABLET | Freq: Every day | ORAL | 0 refills | Status: DC

## 2023-02-22 MED ORDER — AZITHROMYCIN 250 MG PO TABS: 250.0000 mg | ORAL_TABLET | Freq: Every day | ORAL | 0 refills | Status: DC

## 2023-02-22 MED ORDER — ONDANSETRON 8 MG PO TBDP: 8.0000 mg | ORAL_TABLET | Freq: Three times a day (TID) | ORAL | 0 refills | Status: DC | PRN

## 2023-02-22 MED ORDER — KETOROLAC TROMETHAMINE 30 MG/ML IJ SOLN
30.0000 mg | Freq: Once | INTRAMUSCULAR | Status: AC
  Administered 2023-02-22: 30 mg via INTRAMUSCULAR

## 2023-02-22 NOTE — ED Triage Notes (Signed)
Fatigue, nausea, vomiting, sore throat, neck pain, headache, chest tightness x 5 days. Taking sudafed, OTC allergy medication, Zofran. Pt son was positive for strep throat this week.

## 2023-02-22 NOTE — Discharge Instructions (Signed)
Begin azithromycin to provide coverage for bacteria  You have been given an injection of Toradol to help reduce inflammation and help with pain, daily will start to see improvement in headache and muscular pain in the next 30 minutes to an hour  Starting tomorrow take prednisone as directed to continue process above  May continue use of Zofran as needed for nausea and gagging    You can take Tylenol and/or Ibuprofen as needed for fever reduction and pain relief.   For cough: honey 1/2 to 1 teaspoon (you can dilute the honey in water or another fluid).  You can also use guaifenesin and dextromethorphan for cough. You can use a humidifier for chest congestion and cough.  If you don't have a humidifier, you can sit in the bathroom with the hot shower running.      For sore throat: try warm salt water gargles, cepacol lozenges, throat spray, warm tea or water with lemon/honey, popsicles or ice, or OTC cold relief medicine for throat discomfort.   For congestion: take a daily anti-histamine like Zyrtec, Claritin, and a oral decongestant, such as pseudoephedrine.  You can also use Flonase 1-2 sprays in each nostril daily.   It is important to stay hydrated: drink plenty of fluids (water, gatorade/powerade/pedialyte, juices, or teas) to keep your throat moisturized and help further relieve irritation/discomfort.

## 2023-02-23 DIAGNOSIS — Z903 Acquired absence of stomach [part of]: Secondary | ICD-10-CM | POA: Diagnosis not present

## 2023-02-23 DIAGNOSIS — Z13220 Encounter for screening for lipoid disorders: Secondary | ICD-10-CM | POA: Diagnosis not present

## 2023-02-23 DIAGNOSIS — E66811 Obesity, class 1: Secondary | ICD-10-CM | POA: Diagnosis not present

## 2023-02-23 DIAGNOSIS — Z131 Encounter for screening for diabetes mellitus: Secondary | ICD-10-CM | POA: Diagnosis not present

## 2023-02-23 NOTE — ED Provider Notes (Signed)
Ann Santos    CSN: [id number] Arrival date & time: 02/22/23  1632      History   Chief Complaint Chief Complaint  Patient presents with   Nausea   Headache    HPI Ann Santos is a 42 y.o. female.   Patient presents for evaluation of constant generalized headache, nausea and gagging, sore throat, neck pain, increased fatigue and a tightness to the throat present for 5 days.  Experiencing a decreased appetite but able to tolerate fluids.  Known sick contact in household with exposure to strep, also works as a Runner, broadcasting/film/video.  Has attempted to manage symptoms with Zofran, Kaopectate.  Denies abdominal pain, diarrhea, ear pain, congestion or coughing.  Past Medical History:  Diagnosis Date   Abdominal distention    Abdominal pain    Anxiety    Blood in stool    Constipation    Diverticulitis    Per pt. States she never had CT scan to confirm this   Diverticulosis    PT HOSPITALIZED AT [facility] October 03, 2011   Gestational hypertension 07/05/2013   Gestational hypertension 07/05/2013   History of chicken pox    childhood   History of hiatal hernia    Hypertension    During pregnancy only   Maternal obesity, antepartum 07/05/2013   Maternal obesity, antepartum 07/05/2013   Numbness    first two fingers right hand    Rectal bleeding    Shortness of breath dyspnea    with exercise   Umbilical hernia    planning surgical repair    Patient Active Problem List   Diagnosis Date Noted   History of DVT (deep vein thrombosis) 08/29/2019   On continuous oral anticoagulation 08/29/2019   Morbid obesity (HCC) 11/17/2014   Diverticular disease 10/23/2012   Umbilical hernia 10/26/2011    Past Surgical History:  Procedure Laterality Date   ABDOMINOPLASTY     w/ hernia revision 2020 @ [facility]   BREATH TEK H PYLORI N/A 07/22/2014   Procedure: BREATH TEK H PYLORI;  Surgeon: Ann Santos;  Location: [facility];  Service: General;  Laterality: N/A;   CESAREAN SECTION   05/28/2009   CESAREAN SECTION N/A 07/05/2013   Procedure: REPEAT CESAREAN SECTION (With Special Wound Vac);  Surgeon: Ann Santos;  Location: [facility];  Service: Obstetrics;  Laterality: N/A;   EDD: 07/23/13   COLONOSCOPY  10/07/2011   Procedure: COLONOSCOPY;  Surgeon: Ann Santos;  Location: [facility];  Service: Endoscopy;  Laterality: N/A;   ESOPHAGOGASTRODUODENOSCOPY  10/07/2011   Procedure: ESOPHAGOGASTRODUODENOSCOPY (EGD);  Surgeon: Ann Santos;  Location: [facility];  Service: Endoscopy;  Laterality: N/A;   HERNIA REPAIR     LAPAROSCOPIC GASTRIC SLEEVE RESECTION WITH HIATAL HERNIA REPAIR N/A 11/17/2014   Procedure: LAPAROSCOPIC GASTRIC SLEEVE RESECTION ;  Surgeon: Ann Santos;  Location: [facility];  Service: General;  Laterality: N/A;   SCAR REVISION  09/17/2009   c-section scar revision   UMBILICAL HERNIA REPAIR  11/01/2011   Procedure: HERNIA REPAIR UMBILICAL ADULT;  Surgeon: Ann Santos;  Location: [facility];  Service: General;  Laterality: N/A;    OB History     Gravida  2   Para  2   Term  2   Preterm      AB      Living  2      SAB      IAB      Ectopic  Multiple      Live Births  2            Home Medications    Prior to Admission medications   Medication Sig Start Date End Date Taking? Authorizing Provider  azithromycin (ZITHROMAX) 250 MG tablet Take 1 tablet (250 mg total) by mouth daily. Take first 2 tablets together, then 1 every day until finished. 02/22/23  Yes Ann Lewelling Santos, Santos  CALCIUM PO Take 1 tablet daily by mouth.   Yes Provider, Historical, Santos  Cyanocobalamin 1000 MCG SUBL Place under the tongue.   Yes Provider, Historical, Santos  lamoTRIgine (LAMICTAL) 150 MG tablet Take 150 mg by mouth daily.   Yes Provider, Historical, Santos  Multiple Vitamins-Minerals (WOMENS MULTIVITAMIN) TABS Take 1 tablet by mouth daily.   Yes Provider, Historical, Santos  ondansetron (ZOFRAN) 4 MG tablet Take 1 tablet (4 mg total) by  mouth every 8 (eight) hours as needed for nausea or vomiting. 12/06/21  Yes Ann Santos  ondansetron (ZOFRAN-ODT) 8 MG disintegrating tablet Take 1 tablet (8 mg total) by mouth every 8 (eight) hours as needed for nausea or vomiting. 02/22/23  Yes Ann Santos  predniSONE (STERAPRED UNI-PAK 21 TAB) 10 MG (21) TBPK tablet Take by mouth daily. Take 6 tabs by mouth daily  for 1 days, then 5 tabs for 1 days, then 4 tabs for 1 days, then 3 tabs for 1 days, 2 tabs for 1 days, then 1 tab by mouth daily for 1 days 02/22/23  Yes Ann Santos  Probiotic Product (PROBIOTIC-10 PO) Probiotic   Yes Provider, Historical, Santos  sertraline (ZOLOFT) 100 MG tablet Take 2 tablets (200 mg total) by mouth daily. 01/11/23  Yes Ann Santos  traZODone (DESYREL) 100 MG tablet Take 2 tablets (200 mg total) by mouth at bedtime. 01/11/23 01/06/24 Yes Ann Santos  VYVANSE 50 MG CHEW Chew 1 tablet by mouth every morning. 06/16/21  Yes Provider, Historical, Santos  WEGOVY 1.7 MG/0.75ML SOAJ Inject 1.7 mg into the skin. 10/31/22  Yes Provider, Historical, Santos  acetaminophen (TYLENOL) 325 MG tablet Take 650 mg by mouth every 6 (six) hours as needed for headache.    Provider, Historical, Santos  albuterol (VENTOLIN HFA) 108 (90 Base) MCG/ACT inhaler Inhale 2 puffs into the lungs every 6 (six) hours as needed for wheezing or shortness of breath. 01/20/23   Ann Santos  benzonatate (TESSALON) 100 MG capsule Take 1 capsule (100 mg total) by mouth 3 (three) times daily as needed. 01/20/23   Ann Santos  fluconazole (DIFLUCAN) 150 MG tablet Take 1 tablet (150 mg total) by mouth once a week. 12/25/22   Ann Santos  Liraglutide -Weight Management (SAXENDA) 18 MG/3ML SOPN Inject 3 mg into the skin daily. 08/24/21   Ann Santos  Ann Dimesylate (VYVANSE PO) Take by mouth. Patient not taking: Reported on 08/24/2021    Provider, Historical, Santos  LYSINE PO Take 1 tablet by  mouth daily.    Provider, Historical, Santos  naproxen (NAPROSYN) 500 MG tablet Take 1 tablet (500 mg total) by mouth 2 (two) times daily. Patient not taking: Reported on 08/24/2021 06/28/21   Ann Santos  ondansetron (ZOFRAN-ODT) 4 MG disintegrating tablet Take 1 tablet (4 mg total) by mouth every 8 (eight) hours as needed for nausea or vomiting. 04/16/22   Crain, Whitney L, PA  oxyCODONE (ROXICODONE) 5 MG immediate release tablet Take 1 tablet (  5 mg total) by mouth every 6 (six) hours as needed for severe pain. 12/19/21   Rexford Maus, DO  valACYclovir (VALTREX) 500 MG tablet TAKE 1 TABLET BY MOUTH TWICE A DAY 08/07/22   Ann Santos    Family History Family History  Problem Relation Age of Onset   Diabetes Mother    Hypertension Mother    Ulcerative colitis Father    Hypertension Father    Cancer Maternal Grandmother        lung    Social History Social History   Tobacco Use   Smoking status: Never   Smokeless tobacco: Never  Vaping Use   Vaping status: Never Used  Substance Use Topics   Alcohol use: Yes    Comment: occasionally   Drug use: No     Allergies   Patient has no known allergies.   Review of Systems Review of Systems  Neurological:  Positive for headaches.     Physical Exam Triage Vital Signs ED Triage Vitals  Encounter Vitals Group     BP 02/22/23 1718 (!) 154/92     Systolic BP Percentile --      Diastolic BP Percentile --      Pulse Rate 02/22/23 1718 89     Resp 02/22/23 1718 16     Temp 02/22/23 1718 99.9 F (37.7 C)     Temp Source 02/22/23 1718 Oral     SpO2 02/22/23 1718 99 %     Weight --      Height --      Head Circumference --      Peak Flow --      Pain Score 02/22/23 1719 7     Pain Loc --      Pain Education --      Exclude from Growth Chart --    No data found.  Updated Vital Signs BP (!) 154/92 (BP Location: Left Arm)   Pulse 89   Temp 99.9 F (37.7 C) (Oral)   Resp 16   LMP  (LMP  Unknown)   SpO2 99%   Visual Acuity Right Eye Distance:   Left Eye Distance:   Bilateral Distance:    Right Eye Near:   Left Eye Near:    Bilateral Near:     Physical Exam Constitutional:      Appearance: She is ill-appearing.  HENT:     Head: Normocephalic.     Right Ear: Tympanic membrane, ear canal and external ear normal.     Left Ear: Tympanic membrane, ear canal and external ear normal.     Nose: No congestion or rhinorrhea.     Mouth/Throat:     Mouth: Mucous membranes are moist.     Pharynx: Oropharynx is clear. Posterior oropharyngeal erythema present. No oropharyngeal exudate.  Eyes:     Extraocular Movements: Extraocular movements intact.  Cardiovascular:     Rate and Rhythm: Normal rate and regular rhythm.     Pulses: Normal pulses.     Heart sounds: Normal heart sounds.  Pulmonary:     Effort: Pulmonary effort is normal.     Breath sounds: Normal breath sounds.  Musculoskeletal:     Cervical back: Normal range of motion and neck supple.  Skin:    General: Skin is warm and dry.  Neurological:     Mental Status: She is alert and oriented to person, place, and time. Mental status is at baseline.      UC Treatments /  Results  Labs (all labs ordered are listed, but only abnormal results are displayed) Labs Reviewed - No data to display  EKG   Radiology No results found.  Procedures Procedures (including critical care time)  Medications Ordered in UC Medications  ketorolac (TORADOL) 30 MG/ML injection 30 mg (30 mg Intramuscular Given 02/22/23 1740)    Initial Impression / Assessment and Plan / UC Course  I have reviewed the triage vital signs and the nursing notes.  Pertinent labs & imaging results that were available during my care of the patient were reviewed by me and considered in my medical decision making (see chart for details).  Acute URI  Patient is in no signs of distress nor toxic appearing.  Vital signs are stable.  Low suspicion  for pneumonia, pneumothorax or bronchitis and therefore will defer imaging.  Prescribed azithromycin, prednisone and increase dosage of Zofran from 4 mg to 8.May use additional over-the-counter medications as needed for supportive care.  May follow-up with urgent care as needed if symptoms persist or worsen.  Note given.   Final Clinical Impressions(s) / UC Diagnoses   Final diagnoses:  Acute URI     Discharge Instructions      Begin azithromycin to provide coverage for bacteria  You have been given an injection of Toradol to help reduce inflammation and help with pain, daily will start to see improvement in headache and muscular pain in the next 30 minutes to an hour  Starting tomorrow take prednisone as directed to continue process above  May continue use of Zofran as needed for nausea and gagging    You can take Tylenol and/or Ibuprofen as needed for fever reduction and pain relief.   For cough: honey 1/2 to 1 teaspoon (you can dilute the honey in water or another fluid).  You can also use guaifenesin and dextromethorphan for cough. You can use a humidifier for chest congestion and cough.  If you don't have a humidifier, you can sit in the bathroom with the hot shower running.      For sore throat: try warm salt water gargles, cepacol lozenges, throat spray, warm tea or water with lemon/honey, popsicles or ice, or OTC cold relief medicine for throat discomfort.   For congestion: take a daily anti-histamine like Zyrtec, Claritin, and a oral decongestant, such as pseudoephedrine.  You can also use Flonase 1-2 sprays in each nostril daily.   It is important to stay hydrated: drink plenty of fluids (water, gatorade/powerade/pedialyte, juices, or teas) to keep your throat moisturized and help further relieve irritation/discomfort.    ED Prescriptions     Medication Sig Dispense Auth. Provider   predniSONE (STERAPRED UNI-PAK 21 TAB) 10 MG (21) TBPK tablet Take by mouth daily. Take 6  tabs by mouth daily  for 1 days, then 5 tabs for 1 days, then 4 tabs for 1 days, then 3 tabs for 1 days, 2 tabs for 1 days, then 1 tab by mouth daily for 1 days 21 tablet Calin Fantroy Santos, Santos   azithromycin (ZITHROMAX) 250 MG tablet Take 1 tablet (250 mg total) by mouth daily. Take first 2 tablets together, then 1 every day until finished. 6 tablet Lilley Hubble Santos, Santos   ondansetron (ZOFRAN-ODT) 8 MG disintegrating tablet Take 1 tablet (8 mg total) by mouth every 8 (eight) hours as needed for nausea or vomiting. 20 tablet Valinda Hoar, Santos      PDMP not reviewed this encounter.   Valinda Hoar, Santos 02/23/23  0842  

## 2023-02-24 DIAGNOSIS — Z903 Acquired absence of stomach [part of]: Secondary | ICD-10-CM | POA: Diagnosis not present

## 2023-02-24 DIAGNOSIS — E785 Hyperlipidemia, unspecified: Secondary | ICD-10-CM | POA: Diagnosis not present

## 2023-02-24 DIAGNOSIS — E66811 Obesity, class 1: Secondary | ICD-10-CM | POA: Diagnosis not present

## 2023-03-16 DIAGNOSIS — F900 Attention-deficit hyperactivity disorder, predominantly inattentive type: Secondary | ICD-10-CM | POA: Diagnosis not present

## 2023-03-16 DIAGNOSIS — F411 Generalized anxiety disorder: Secondary | ICD-10-CM | POA: Diagnosis not present

## 2023-03-16 DIAGNOSIS — F331 Major depressive disorder, recurrent, moderate: Secondary | ICD-10-CM | POA: Diagnosis not present

## 2023-04-01 ENCOUNTER — Telehealth: Payer: BC Managed Care – PPO | Admitting: Family Medicine

## 2023-04-01 DIAGNOSIS — Z20818 Contact with and (suspected) exposure to other bacterial communicable diseases: Secondary | ICD-10-CM | POA: Diagnosis not present

## 2023-04-01 DIAGNOSIS — J020 Streptococcal pharyngitis: Secondary | ICD-10-CM | POA: Diagnosis not present

## 2023-04-01 MED ORDER — FLUCONAZOLE 150 MG PO TABS: 150.0000 mg | ORAL_TABLET | ORAL | 0 refills | Status: AC

## 2023-04-01 MED ORDER — AMOXICILLIN 875 MG PO TABS: 875.0000 mg | ORAL_TABLET | Freq: Two times a day (BID) | ORAL | 0 refills | Status: AC

## 2023-04-01 NOTE — Progress Notes (Signed)
Virtual Visit Consent   Ann Santos, you are scheduled for a virtual visit with a [facility] provider today. Just as with appointments in the office, your consent must be obtained to participate. Your consent will be active for this visit and any virtual visit you may have with one of our providers in the next 365 days. If you have a MyChart account, a copy of this consent can be sent to you electronically.  As this is a virtual visit, video technology does not allow for your provider to perform a traditional examination. This may limit your provider's ability to fully assess your condition. If your provider identifies any concerns that need to be evaluated in person or the need to arrange testing (such as labs, EKG, etc.), we will make arrangements to do so. Although advances in technology are sophisticated, we cannot ensure that it will always work on either your end or our end. If the connection with a video visit is poor, the visit may have to be switched to a telephone visit. With either a video or telephone visit, we are not always able to ensure that we have a secure connection.  By engaging in this virtual visit, you consent to the provision of healthcare and authorize for your insurance to be billed (if applicable) for the services provided during this visit. Depending on your insurance coverage, you may receive a charge related to this service.  I need to obtain your verbal consent now. Are you willing to proceed with your visit today? Illyanna R Devall has provided verbal consent on 04/01/2023 for a virtual visit (video or telephone). Georgana Curio, FNP  Date: 04/01/2023 10:07 AM  Virtual Visit via Video Note   I, Georgana Curio, connected with  DIANELYS GENGLER  ([id number], Sep 21, 1980) on 04/01/23 at 10:00 AM EST by a video-enabled telemedicine application and verified that I am speaking with the correct person using two identifiers.  Location: Patient: Virtual Visit Location Patient:  Home Provider: Virtual Visit Location Provider: Home Office   I discussed the limitations of evaluation and management by telemedicine and the availability of in person appointments. The patient expressed understanding and agreed to proceed.    History of Present Illness: Ann Santos is a 42 y.o. who identifies as a female who was assigned female at birth, and is being seen today for sore throat, fever, nausea, headache and body aches starting yesterday exposed at school. Sx worsening. Marland Kitchen  HPI: HPI  Problems:  Patient Active Problem List   Diagnosis Date Noted   History of DVT (deep vein thrombosis) 08/29/2019   On continuous oral anticoagulation 08/29/2019   Morbid obesity (HCC) 11/17/2014   Diverticular disease 10/23/2012   Umbilical hernia 10/26/2011    Allergies: No Known Allergies Medications:  Current Outpatient Medications:    amoxicillin (AMOXIL) 875 MG tablet, Take 1 tablet (875 mg total) by mouth 2 (two) times daily for 10 days., Disp: 20 tablet, Rfl: 0   fluconazole (DIFLUCAN) 150 MG tablet, Take 1 tablet (150 mg total) by mouth 3 (three) times a week for 3 doses., Disp: 3 tablet, Rfl: 0   acetaminophen (TYLENOL) 325 MG tablet, Take 650 mg by mouth every 6 (six) hours as needed for headache., Disp: , Rfl:    albuterol (VENTOLIN HFA) 108 (90 Base) MCG/ACT inhaler, Inhale 2 puffs into the lungs every 6 (six) hours as needed for wheezing or shortness of breath., Disp: 8 g, Rfl: 0   azithromycin (ZITHROMAX) 250 MG tablet,  Take 1 tablet (250 mg total) by mouth daily. Take first 2 tablets together, then 1 every day until finished., Disp: 6 tablet, Rfl: 0   benzonatate (TESSALON) 100 MG capsule, Take 1 capsule (100 mg total) by mouth 3 (three) times daily as needed., Disp: 30 capsule, Rfl: 0   CALCIUM PO, Take 1 tablet daily by mouth., Disp: , Rfl:    Cyanocobalamin 1000 MCG SUBL, Place under the tongue., Disp: , Rfl:    fluconazole (DIFLUCAN) 150 MG tablet, Take 1 tablet (150 mg  total) by mouth once a week., Disp: 2 tablet, Rfl: 0   lamoTRIgine (LAMICTAL) 150 MG tablet, Take 150 mg by mouth daily., Disp: , Rfl:    Liraglutide -Weight Management (SAXENDA) 18 MG/3ML SOPN, Inject 3 mg into the skin daily., Disp: 3 mL, Rfl: 7   Lisdexamfetamine Dimesylate (VYVANSE PO), Take by mouth. (Patient not taking: Reported on 08/24/2021), Disp: , Rfl:    LYSINE PO, Take 1 tablet by mouth daily., Disp: , Rfl:    Multiple Vitamins-Minerals (WOMENS MULTIVITAMIN) TABS, Take 1 tablet by mouth daily., Disp: , Rfl:    naproxen (NAPROSYN) 500 MG tablet, Take 1 tablet (500 mg total) by mouth 2 (two) times daily. (Patient not taking: Reported on 08/24/2021), Disp: 30 tablet, Rfl: 0   ondansetron (ZOFRAN) 4 MG tablet, Take 1 tablet (4 mg total) by mouth every 8 (eight) hours as needed for nausea or vomiting., Disp: 10 tablet, Rfl: 0   ondansetron (ZOFRAN-ODT) 4 MG disintegrating tablet, Take 1 tablet (4 mg total) by mouth every 8 (eight) hours as needed for nausea or vomiting., Disp: 20 tablet, Rfl: 0   ondansetron (ZOFRAN-ODT) 8 MG disintegrating tablet, Take 1 tablet (8 mg total) by mouth every 8 (eight) hours as needed for nausea or vomiting., Disp: 20 tablet, Rfl: 0   oxyCODONE (ROXICODONE) 5 MG immediate release tablet, Take 1 tablet (5 mg total) by mouth every 6 (six) hours as needed for severe pain., Disp: 6 tablet, Rfl: 0   predniSONE (STERAPRED UNI-PAK 21 TAB) 10 MG (21) TBPK tablet, Take by mouth daily. Take 6 tabs by mouth daily  for 1 days, then 5 tabs for 1 days, then 4 tabs for 1 days, then 3 tabs for 1 days, 2 tabs for 1 days, then 1 tab by mouth daily for 1 days, Disp: 21 tablet, Rfl: 0   Probiotic Product (PROBIOTIC-10 PO), Probiotic, Disp: , Rfl:    sertraline (ZOLOFT) 100 MG tablet, Take 2 tablets (200 mg total) by mouth daily., Disp: 180 tablet, Rfl: 1   traZODone (DESYREL) 100 MG tablet, Take 2 tablets (200 mg total) by mouth at bedtime., Disp: 180 tablet, Rfl: 3   valACYclovir  (VALTREX) 500 MG tablet, TAKE 1 TABLET BY MOUTH TWICE A DAY, Disp: 20 tablet, Rfl: 5   VYVANSE 50 MG CHEW, Chew 1 tablet by mouth every morning., Disp: , Rfl:    WEGOVY 1.7 MG/0.75ML SOAJ, Inject 1.7 mg into the skin., Disp: , Rfl:   Observations/Objective: Patient is well-developed, well-nourished in no acute distress.  Resting comfortably  at home.  Head is normocephalic, atraumatic.  No labored breathing.  Speech is clear and coherent with logical content.  Patient is alert and oriented at baseline.    Assessment and Plan: 1. Strep throat  Increase fluids, warm salt water gargles, ibuprofen as directed, UC if sx worsen.   Follow Up Instructions: I discussed the assessment and treatment plan with the patient. The patient was provided an  opportunity to ask questions and all were answered. The patient agreed with the plan and demonstrated an understanding of the instructions.  A copy of instructions were sent to the patient via MyChart unless otherwise noted below.     The patient was advised to call back or seek an in-person evaluation if the symptoms worsen or if the condition fails to improve as anticipated.    Georgana Curio, FNP

## 2023-04-01 NOTE — Patient Instructions (Signed)

## 2023-04-21 DIAGNOSIS — F331 Major depressive disorder, recurrent, moderate: Secondary | ICD-10-CM | POA: Diagnosis not present

## 2023-04-21 DIAGNOSIS — F900 Attention-deficit hyperactivity disorder, predominantly inattentive type: Secondary | ICD-10-CM | POA: Diagnosis not present

## 2023-04-21 DIAGNOSIS — F411 Generalized anxiety disorder: Secondary | ICD-10-CM | POA: Diagnosis not present

## 2023-05-11 ENCOUNTER — Telehealth: Payer: Self-pay | Admitting: Emergency Medicine

## 2023-05-11 NOTE — Telephone Encounter (Signed)
Patient requesting orders for TB test from provider.   Copied from CRM [id number]. Topic: Appointments - Scheduling Inquiry for Clinic >> May 11, 2023  9:05 AM [place] A wrote: Reason for CRM: Patient was calling to see if she could schedule a TB test for work

## 2023-05-12 ENCOUNTER — Ambulatory Visit: Payer: BC Managed Care – PPO

## 2023-05-12 NOTE — Telephone Encounter (Signed)
Appt 04/2023

## 2023-05-15 ENCOUNTER — Ambulatory Visit (INDEPENDENT_AMBULATORY_CARE_PROVIDER_SITE_OTHER): Payer: BC Managed Care – PPO

## 2023-05-15 DIAGNOSIS — Z111 Encounter for screening for respiratory tuberculosis: Secondary | ICD-10-CM

## 2023-05-15 NOTE — Progress Notes (Signed)
Pt had PPD skin test placed today on rt forearm w/o any reaction so far.

## 2023-05-17 ENCOUNTER — Ambulatory Visit (INDEPENDENT_AMBULATORY_CARE_PROVIDER_SITE_OTHER): Payer: BC Managed Care – PPO

## 2023-05-17 DIAGNOSIS — Z111 Encounter for screening for respiratory tuberculosis: Secondary | ICD-10-CM

## 2023-05-17 DIAGNOSIS — Z23 Encounter for immunization: Secondary | ICD-10-CM

## 2023-05-17 LAB — TB SKIN TEST
Induration: 0 mm
TB Skin Test: NEGATIVE

## 2023-05-17 NOTE — Progress Notes (Signed)
Patient visits today to receive her TB Read that is NEGTIVE. Patient was informed and tolerated well. Patient was notified to reach out to [place] if needed.

## 2023-05-31 DIAGNOSIS — R509 Fever, unspecified: Secondary | ICD-10-CM | POA: Diagnosis not present

## 2023-05-31 DIAGNOSIS — F411 Generalized anxiety disorder: Secondary | ICD-10-CM | POA: Diagnosis not present

## 2023-05-31 DIAGNOSIS — F902 Attention-deficit hyperactivity disorder, combined type: Secondary | ICD-10-CM | POA: Diagnosis not present

## 2023-05-31 DIAGNOSIS — J069 Acute upper respiratory infection, unspecified: Secondary | ICD-10-CM | POA: Diagnosis not present

## 2023-06-01 ENCOUNTER — Ambulatory Visit
Admission: RE | Admit: 2023-06-01 | Discharge: 2023-06-01 | Disposition: A | Discharge status: Home / Self Care | Payer: BC Managed Care – PPO | Source: Ambulatory Visit

## 2023-06-01 ENCOUNTER — Other Ambulatory Visit: Payer: Self-pay

## 2023-06-01 VITALS — BP 138/76 | HR 97 | Temp 99.9°F | Resp 15

## 2023-06-01 DIAGNOSIS — J209 Acute bronchitis, unspecified: Secondary | ICD-10-CM | POA: Diagnosis not present

## 2023-06-01 DIAGNOSIS — J0140 Acute pansinusitis, unspecified: Secondary | ICD-10-CM | POA: Diagnosis not present

## 2023-06-01 DIAGNOSIS — H66001 Acute suppurative otitis media without spontaneous rupture of ear drum, right ear: Secondary | ICD-10-CM | POA: Diagnosis not present

## 2023-06-01 MED ORDER — GUAIFENESIN 400 MG PO TABS: ORAL_TABLET | ORAL | 0 refills | Status: AC

## 2023-06-01 MED ORDER — PROMETHAZINE-DM 6.25-15 MG/5ML PO SYRP
5.0000 mL | ORAL_SOLUTION | Freq: Four times a day (QID) | ORAL | 0 refills | Status: DC | PRN
Start: 2023-06-01 — End: 2023-10-05

## 2023-06-01 MED ORDER — CEFDINIR 300 MG PO CAPS: 300.0000 mg | ORAL_CAPSULE | Freq: Two times a day (BID) | ORAL | 0 refills | Status: AC

## 2023-06-01 MED ORDER — BUDESONIDE-FORMOTEROL FUMARATE 160-4.5 MCG/ACT IN AERO: 2.0000 | INHALATION_SPRAY | Freq: Two times a day (BID) | RESPIRATORY_TRACT | 0 refills | Status: AC

## 2023-06-01 MED ORDER — FLUCONAZOLE 150 MG PO TABS
ORAL_TABLET | ORAL | 0 refills | Status: DC
Start: 2023-06-01 — End: 2023-09-14

## 2023-06-01 NOTE — ED Provider Notes (Signed)
 [facility]    CSN: [id number] Arrival date & time: 06/01/23  1112    HISTORY   Chief Complaint  Patient presents with   Headache    I went to [facility] care yesterday (not [facility]). I can't stop coughing with the gold pills my head is going to explode I had a low grade fever and had taken Tylenol  I tested negative for flu and Covid can't turn my neck chills fever my brain pls help - Entered by patient   HPI Ann Santos is a pleasant, 43 y.o. female who presents to urgent care today. Patient complains of severe cough, severe headache, severe body aches, constant fever, chills for 12 days.  Patient states in the last 3 days, she is gotten significantly worse.  Patient states she was seen in urgent care last night, was tested for COVID and flu, both results were negative.  Patient states she was given a prescription for Tessalon  Perles and an inhaler, states she has been taking the pills, which make her feel very jittery, but has not picked up the inhaler yet.  Patient has essentially normal vital signs on arrival today.  Patient denies nausea, vomiting, diarrhea, dizziness, vision changes, shortness of breath.  Patient states she has been taking Tylenol , Sudafed, all kinds of Mucinex  in addition to the Tessalon  Perles.  Patient states the inhaler should be ready today so she will pick it up when she leaves here.  Patient denies history of asthma, bronchitis.    The history is provided by the patient.   Past Medical History:  Diagnosis Date   Abdominal distention    Abdominal pain    Anxiety    Blood in stool    Constipation    Diverticulitis    Per pt. States she never had CT scan to confirm this   Diverticulosis    PT [facility]IZED AT [facility] October 03, 2011   Gestational hypertension 07/05/2013   Gestational hypertension 07/05/2013   History of chicken pox    childhood   History of hiatal hernia    Hypertension    During pregnancy only   Maternal obesity, antepartum 07/05/2013    Maternal obesity, antepartum 07/05/2013   Numbness    first two fingers right hand    Rectal bleeding    Shortness of breath dyspnea    with exercise   Umbilical hernia    planning surgical repair   Patient Active Problem List   Diagnosis Date Noted   History of DVT (deep vein thrombosis) 08/29/2019   On continuous oral anticoagulation 08/29/2019   Morbid obesity (HCC) 11/17/2014   Diverticular disease 10/23/2012   Umbilical hernia 10/26/2011   Past Surgical History:  Procedure Laterality Date   ABDOMINOPLASTY     w/ hernia revision 2020 @ [facility]   BREATH TEK H PYLORI N/A 07/22/2014   Procedure: BREATH TEK H PYLORI;  Surgeon: Alm Angle, MD;  Location: [facility];  Service: General;  Laterality: N/A;   CESAREAN SECTION  05/28/2009   CESAREAN SECTION N/A 07/05/2013   Procedure: REPEAT CESAREAN SECTION (With Special Wound Vac);  Surgeon: Robbi JONELLE Render, MD;  Location: [facility];  Service: Obstetrics;  Laterality: N/A;   EDD: 07/23/13   COLONOSCOPY  10/07/2011   Procedure: COLONOSCOPY;  Surgeon: Belvie JONETTA Just, MD;  Location: [facility];  Service: Endoscopy;  Laterality: N/A;   ESOPHAGOGASTRODUODENOSCOPY  10/07/2011   Procedure: ESOPHAGOGASTRODUODENOSCOPY (EGD);  Surgeon: Belvie JONETTA Just, MD;  Location: [facility];  Service: Endoscopy;  Laterality: N/A;   HERNIA REPAIR     LAPAROSCOPIC GASTRIC SLEEVE RESECTION WITH HIATAL HERNIA REPAIR N/A 11/17/2014   Procedure: LAPAROSCOPIC GASTRIC SLEEVE RESECTION ;  Surgeon: Alm Angle, MD;  Location: [facility];  Service: General;  Laterality: N/A;   SCAR REVISION  09/17/2009   c-section scar revision   UMBILICAL HERNIA REPAIR  11/01/2011   Procedure: HERNIA REPAIR UMBILICAL ADULT;  Surgeon: Krystal CHRISTELLA Spinner, MD;  Location: [facility];  Service: General;  Laterality: N/A;   OB History     Gravida  2   Para  2   Term  2   Preterm      AB      Living  2      SAB      IAB      Ectopic      Multiple      Live Births  2           Home Medications    Prior to Admission medications   Medication Sig Start Date End Date Taking? Authorizing Provider  ALPRAZolam (XANAX) 0.5 MG tablet Take 0.5 mg by mouth at bedtime as needed for anxiety.   Yes [provider]  budesonide -formoterol  (SYMBICORT ) 160-4.5 MCG/ACT inhaler Inhale 2 puffs into the lungs every 12 (twelve) hours. 06/01/23  Yes Joesph Shaver Scales, PA-C  cefdinir  (OMNICEF ) 300 MG capsule Take 1 capsule (300 mg total) by mouth 2 (two) times daily for 10 days. 06/01/23 06/11/23 Yes Joesph Shaver Scales, PA-C  fluconazole  (DIFLUCAN ) 150 MG tablet Take first tablet on day 1.  Take second tablet 3 days after first tablet.  Take third tablet 3 days after second tablet. 06/01/23  Yes Joesph Shaver Scales, PA-C  guaifenesin  (HUMIBID E) 400 MG TABS tablet Take 1 tablet 3 times daily as needed for chest congestion and cough 06/01/23  Yes Joesph Shaver Scales, PA-C  promethazine -dextromethorphan (PROMETHAZINE -DM) 6.25-15 MG/5ML syrup Take 5 mLs by mouth 4 (four) times daily as needed for cough. 06/01/23  Yes Joesph Shaver Scales, PA-C  acetaminophen  (TYLENOL ) 325 MG tablet Take 650 mg by mouth every 6 (six) hours as needed for headache.    [provider]  CALCIUM PO Take 1 tablet daily by mouth.    [provider]  Cyanocobalamin  1000 MCG SUBL Place under the tongue.    [provider]  lamoTRIgine (LAMICTAL) 150 MG tablet Take 150 mg by mouth daily.    [provider]  Lisdexamfetamine Dimesylate  (VYVANSE  PO) Take 60 mg by mouth daily. 05/17/23   [provider]  LYSINE PO Take 1 tablet by mouth daily.    [provider]  Multiple Vitamins-Minerals (WOMENS MULTIVITAMIN) TABS Take 1 tablet by mouth daily.    [provider]  ondansetron  (ZOFRAN ) 4 MG tablet Take 1 tablet (4 mg total) by mouth every 8 (eight) hours as needed for nausea or vomiting. 12/06/21   Moishe Chiquita CHRISTELLA, NP  Probiotic Product  (PROBIOTIC-10 PO) Probiotic    [provider]  sertraline  (ZOLOFT ) 100 MG tablet Take 2 tablets (200 mg total) by mouth daily. 01/11/23   Sagardia, Miguel Jose, MD  traZODone  (DESYREL ) 100 MG tablet Take 2 tablets (200 mg total) by mouth at bedtime. 01/11/23 01/06/24  Purcell Emil Schanz, MD  valACYclovir  (VALTREX ) 500 MG tablet TAKE 1 TABLET BY MOUTH TWICE A DAY 08/07/22   Purcell Emil Schanz, MD  WEGOVY  1.7 MG/0.75ML SOAJ Inject 1.7 mg into the skin. 10/31/22   [provider]  Family History Family History  Problem Relation Age of Onset   Diabetes Mother    Hypertension Mother    Ulcerative colitis Father    Hypertension Father    Cancer Maternal Grandmother        lung   Social History Social History   Tobacco Use   Smoking status: Never   Smokeless tobacco: Never  Vaping Use   Vaping status: Never Used  Substance Use Topics   Alcohol use: Yes    Comment: occasionally   Drug use: No   Allergies   Patient has no known allergies.  Review of Systems Review of Systems Pertinent findings revealed after performing a 14 point review of systems has been noted in the history of present illness.  Physical Exam Vital Signs BP 138/76 (BP Location: Right Arm)   Pulse 97   Temp 99.9 F (37.7 C) (Oral)   Resp 15   SpO2 99%   No data found.  Physical Exam Vitals and nursing note reviewed.  Constitutional:      General: She is awake. She is not in acute distress.    Appearance: Normal appearance. She is well-developed and well-groomed. She is not ill-appearing.  HENT:     Head: Normocephalic and atraumatic.     Salivary Glands: Right salivary gland is not diffusely enlarged or tender. Left salivary gland is not diffusely enlarged or tender.     Right Ear: Hearing, ear canal and external ear normal. No drainage. A middle ear effusion is present. There is no impacted cerumen. Tympanic membrane is injected, erythematous and bulging.     Left Ear: Hearing,  tympanic membrane, ear canal and external ear normal. No drainage.  No middle ear effusion. There is no impacted cerumen. Tympanic membrane is not erythematous or bulging.     Nose: Nose normal. No nasal deformity, septal deviation, mucosal edema, congestion or rhinorrhea.     Right Turbinates: Not enlarged, swollen or pale.     Left Turbinates: Not enlarged, swollen or pale.     Right Sinus: No maxillary sinus tenderness or frontal sinus tenderness.     Left Sinus: No maxillary sinus tenderness or frontal sinus tenderness.     Mouth/Throat:     Lips: Pink. No lesions.     Mouth: Mucous membranes are moist. No oral lesions.     Pharynx: Uvula midline. Pharyngeal swelling and posterior oropharyngeal erythema present. No oropharyngeal exudate, uvula swelling or postnasal drip.     Tonsils: No tonsillar exudate. 0 on the right. 0 on the left.  Eyes:     General: Lids are normal.        Right eye: No discharge.        Left eye: No discharge.     Extraocular Movements: Extraocular movements intact.     Conjunctiva/sclera: Conjunctivae normal.     Right eye: Right conjunctiva is not injected.     Left eye: Left conjunctiva is not injected.  Neck:     Trachea: Trachea and phonation normal.  Cardiovascular:     Rate and Rhythm: Normal rate and regular rhythm.     Pulses: Normal pulses.     Heart sounds: Normal heart sounds. No murmur heard.    No friction rub. No gallop.  Pulmonary:     Effort: Pulmonary effort is normal. No accessory muscle usage, prolonged expiration or respiratory distress.     Breath sounds: No stridor, decreased air movement or transmitted upper airway sounds. Examination of the right-upper field  reveals rhonchi. Examination of the left-upper field reveals rhonchi. Rhonchi present. No decreased breath sounds, wheezing or rales.  Chest:     Chest wall: No tenderness.  Musculoskeletal:        General: Normal range of motion.     Cervical back: Full passive range of motion  without pain, normal range of motion and neck supple. Muscular tenderness present. No spinous process tenderness. Normal range of motion.  Lymphadenopathy:     Cervical: No cervical adenopathy.  Skin:    General: Skin is warm and dry.     Findings: No erythema or rash.  Neurological:     General: No focal deficit present.     Mental Status: She is alert and oriented to person, place, and time.  Psychiatric:        Attention and Perception: Attention and perception normal.        Mood and Affect: Mood is anxious.        Speech: Speech is rapid and pressured.        Behavior: Behavior is agitated. Behavior is cooperative.        Thought Content: Thought content normal.        Cognition and Memory: Cognition and memory normal.        Judgment: Judgment normal.     Visual Acuity Right Eye Distance:   Left Eye Distance:   Bilateral Distance:    Right Eye Near:   Left Eye Near:    Bilateral Near:     UC Couse / Diagnostics / Procedures:     Radiology No results found.  Procedures Procedures (including critical care time) EKG  Pending results:  Labs Reviewed - No data to display  Medications Ordered in UC: Medications - No data to display  UC Diagnoses / Final Clinical Impressions(s)   I have reviewed the triage vital signs and the nursing notes.  Pertinent labs & imaging results that were available during my care of the patient were reviewed by me and considered in my medical decision making (see chart for details).    Final diagnoses:  Acute suppurative otitis media of right ear without spontaneous rupture of tympanic membrane, recurrence not specified  Acute pansinusitis, recurrence not specified  Acute bronchitis, unspecified organism   Patient advised that the inhaler in question is actually a nasal steroid spray.  Based on physical exam findings and the quality of her cough, I believe that she would benefit from an inhaled corticosteroid.  Symbicort  prescribed,  recommend 2 puffs 4 times daily until cough is significantly improved.  Omnicef  3 mg x 10 days provided for treatment of right inner ear infection.  Patient insisted on prescription for Diflucan  with this, this was provided.  Recommend plain Mucinex  in the daytime and Promethazine  DM at night.  Conservative care recommended.  Return precautions advised.   Please see discharge instructions below for details of plan of care as provided to patient. ED Prescriptions     Medication Sig Dispense Auth. Provider   cefdinir  (OMNICEF ) 300 MG capsule Take 1 capsule (300 mg total) by mouth 2 (two) times daily for 10 days. 20 capsule Joesph Shaver Scales, PA-C   fluconazole  (DIFLUCAN ) 150 MG tablet Take first tablet on day 1.  Take second tablet 3 days after first tablet.  Take third tablet 3 days after second tablet. 3 tablet Joesph Shaver Scales, PA-C   guaifenesin  (HUMIBID E) 400 MG TABS tablet Take 1 tablet 3 times daily as needed for chest congestion  and cough 30 tablet Joesph Shaver Scales, PA-C   promethazine -dextromethorphan (PROMETHAZINE -DM) 6.25-15 MG/5ML syrup Take 5 mLs by mouth 4 (four) times daily as needed for cough. 118 mL Joesph Shaver Scales, PA-C   budesonide -formoterol  (SYMBICORT ) 160-4.5 MCG/ACT inhaler Inhale 2 puffs into the lungs every 12 (twelve) hours. 1 each Joesph Shaver Scales, PA-C      I have reviewed the PDMP during this encounter.  Pending results:  Labs Reviewed - No data to display    Discharge Instructions      Please read below to learn more about the medications, dosages and frequencies that I recommend to help alleviate your symptoms and to get you feeling better soon:   Omnicef  (cefdinir ): To treat your bacterial infection in your right inner ear, which is also likely in your sinuses, please take one (1) dose twice daily for 10 days.  This antibiotic can cause upset stomach, this will resolve once antibiotics are complete.  You are welcome to take a  probiotic, eat yogurt, take Imodium while taking this medication.  Please avoid other systemic medications such as Maalox, Pepto-Bismol or milk of magnesia as they can interfere with the body's ability to absorb the antibiotics.  Diflucan  was also prescribed at your request.   Symbicort  (budesonide  and formoterol ): After reviewing the note from your visit yesterday, I see that you were not provided with a steroid inhaler but instead a steroid nasal spray, which you are certainly welcome to use.  I recommend that you begin Symbicort  as well.  Please inhale 2 puffs 4 times daily.  Please be aware that the prescription is written for you to use it twice daily.  Unfortunately your insurance will not pay for it if I write the prescription for you to use it 4 times daily.  4 times daily is the recommended dosage for treating bronchitis.  This inhaled medication contains a corticosteroid and long-acting form of albuterol .  The inhaled steroid and this medication is not absorbed into the body and will not cause side effects such as increased blood sugar levels, irritability, sleeplessness, weight gain, rapid heart rate or feeling jittery.  Inhaled corticosteroids are sort of like topical steroid creams but, as you can imagine, it is not practical to attempt to rub a steroid cream inside of your lungs.  The long-acting albuterol  works similarly to the short acting albuterol  found in your rescue inhaler but provides 24-hour relaxation of the smooth muscles that open and constrict your airways; your short acting rescue inhaler can only provide for a few hours this benefit for a few hours.     Robitussin, Mucinex  (guaifenesin ): This is a daytime expectorant.  This single symptom reliever helps break up chest congestion and loosen up thick nasal drainage making phlegm and drainage easier to cough up and to blow out from your nose.  I recommend taking 400 mg in either liquid or tablet form three times daily as needed.  I do  not recommend the 12-hour extended relief version or doses higher than 400 mg per each dose as these often make some patients feel jittery or jumpy and can interfere with sleep.  I also do not recommend that you purchase guaifenesin  with the ingredient  DM which is dextromethorphan, a cough suppressant which I only recommend taking at bedtime.  Guaifenesin  400 mg is a safe dose for people who are being treated for high blood pressure.     Promethazine  DM: Promethazine  is both a nasal decongestant that dries up mucous  membranes and an antinausea medication.  Promethazine  often makes most patients feel fairly sleepy.  DM is dextromethorphan, a single symptom reliever which is a cough suppressant found in many over-the-counter cough medications and combination cold preparations.  Please take 5 mL before bedtime to minimize your cough which will help you sleep better.  I have sent a prescription for this medication to your pharmacy because it cannot be purchased over-the-counter.  If you have not had meaningful improvement of your symptoms in the next 4 to 6 days, please follow-up with your primary care provider or return to urgent care for further evaluation.  If you are feeling worse within this time.,  Please return sooner.  Thank you for visiting [facility] today.       Disposition Upon Discharge:  Condition: stable for discharge home  Patient presented with an acute illness with associated systemic symptoms and significant discomfort requiring urgent management. In my opinion, this is a condition that a prudent lay person (someone who possesses an average knowledge of health and medicine) may potentially expect to result in complications if not addressed urgently such as respiratory distress, impairment of bodily function or dysfunction of bodily organs.   Routine symptom specific, illness specific and/or disease specific instructions were discussed with the patient and/or  caregiver at length.   As such, the patient has been evaluated and assessed, work-up was performed and treatment was provided in alignment with urgent care protocols and evidence based medicine.  Patient/parent/caregiver has been advised that the patient may require follow up for further testing and treatment if the symptoms continue in spite of treatment, as clinically indicated and appropriate.  Patient/parent/caregiver has been advised to return to the [facility] or PCP if no better; to PCP or the Emergency Department if new signs and symptoms develop, or if the current signs or symptoms continue to change or worsen for further workup, evaluation and treatment as clinically indicated and appropriate  The patient will follow up with their current PCP if and as advised. If the patient does not currently have a PCP we will assist them in obtaining one.   The patient may need specialty follow up if the symptoms continue, in spite of conservative treatment and management, for further workup, evaluation, consultation and treatment as clinically indicated and appropriate.  Patient/parent/caregiver verbalized understanding and agreement of plan as discussed.  All questions were addressed during visit.  Please see discharge instructions below for further details of plan.  This office note has been dictated using Teaching laboratory technician.  Unfortunately, this method of dictation can sometimes lead to typographical or grammatical errors.  I apologize for your inconvenience in advance if this occurs.  Please do not hesitate to reach out to me if clarification is needed.      Joesph Shaver Scales, PA-C 06/01/23 1240

## 2023-06-01 NOTE — ED Triage Notes (Signed)
 Pt states she is having constant fever, chills, cough severe headache and body aches for over a week, was seen on another UC and tested for covid and flue with negative result. Pt states she is been using the medication prescribed with no relief.

## 2023-06-01 NOTE — Discharge Instructions (Signed)
 Please read below to learn more about the medications, dosages and frequencies that I recommend to help alleviate your symptoms and to get you feeling better soon:   Omnicef  (cefdinir ): To treat your bacterial infection in your right inner ear, which is also likely in your sinuses, please take one (1) dose twice daily for 10 days.  This antibiotic can cause upset stomach, this will resolve once antibiotics are complete.  You are welcome to take a probiotic, eat yogurt, take Imodium while taking this medication.  Please avoid other systemic medications such as Maalox, Pepto-Bismol or milk of magnesia as they can interfere with the body's ability to absorb the antibiotics.  Diflucan  was also prescribed at your request.   Symbicort  (budesonide  and formoterol ): After reviewing the note from your visit yesterday, I see that you were not provided with a steroid inhaler but instead a steroid nasal spray, which you are certainly welcome to use.  I recommend that you begin Symbicort  as well.  Please inhale 2 puffs 4 times daily.  Please be aware that the prescription is written for you to use it twice daily.  Unfortunately your insurance will not pay for it if I write the prescription for you to use it 4 times daily.  4 times daily is the recommended dosage for treating bronchitis.  This inhaled medication contains a corticosteroid and long-acting form of albuterol .  The inhaled steroid and this medication is not absorbed into the body and will not cause side effects such as increased blood sugar levels, irritability, sleeplessness, weight gain, rapid heart rate or feeling jittery.  Inhaled corticosteroids are sort of like topical steroid creams but, as you can imagine, it is not practical to attempt to rub a steroid cream inside of your lungs.  The long-acting albuterol  works similarly to the short acting albuterol  found in your rescue inhaler but provides 24-hour relaxation of the smooth muscles that open and constrict  your airways; your short acting rescue inhaler can only provide for a few hours this benefit for a few hours.     Robitussin, Mucinex  (guaifenesin ): This is a daytime expectorant.  This single symptom reliever helps break up chest congestion and loosen up thick nasal drainage making phlegm and drainage easier to cough up and to blow out from your nose.  I recommend taking 400 mg in either liquid or tablet form three times daily as needed.  I do not recommend the 12-hour extended relief version or doses higher than 400 mg per each dose as these often make some patients feel jittery or jumpy and can interfere with sleep.  I also do not recommend that you purchase guaifenesin  with the ingredient  DM which is dextromethorphan, a cough suppressant which I only recommend taking at bedtime.  Guaifenesin  400 mg is a safe dose for people who are being treated for high blood pressure.     Promethazine  DM: Promethazine  is both a nasal decongestant that dries up mucous membranes and an antinausea medication.  Promethazine  often makes most patients feel fairly sleepy.  DM is dextromethorphan, a single symptom reliever which is a cough suppressant found in many over-the-counter cough medications and combination cold preparations.  Please take 5 mL before bedtime to minimize your cough which will help you sleep better.  I have sent a prescription for this medication to your pharmacy because it cannot be purchased over-the-counter.  If you have not had meaningful improvement of your symptoms in the next 4 to 6 days, please follow-up  with your primary care provider or return to urgent care for further evaluation.  If you are feeling worse within this time.,  Please return sooner.  Thank you for visiting [facility] today.

## 2023-06-12 DIAGNOSIS — F900 Attention-deficit hyperactivity disorder, predominantly inattentive type: Secondary | ICD-10-CM | POA: Diagnosis not present

## 2023-06-12 DIAGNOSIS — F411 Generalized anxiety disorder: Secondary | ICD-10-CM | POA: Diagnosis not present

## 2023-06-12 DIAGNOSIS — F331 Major depressive disorder, recurrent, moderate: Secondary | ICD-10-CM | POA: Diagnosis not present

## 2023-06-27 ENCOUNTER — Other Ambulatory Visit: Payer: Self-pay | Admitting: Emergency Medicine

## 2023-07-05 DIAGNOSIS — F331 Major depressive disorder, recurrent, moderate: Secondary | ICD-10-CM | POA: Diagnosis not present

## 2023-07-05 DIAGNOSIS — F900 Attention-deficit hyperactivity disorder, predominantly inattentive type: Secondary | ICD-10-CM | POA: Diagnosis not present

## 2023-07-05 DIAGNOSIS — F411 Generalized anxiety disorder: Secondary | ICD-10-CM | POA: Diagnosis not present

## 2023-08-25 ENCOUNTER — Telehealth: Admitting: Family Medicine

## 2023-08-25 DIAGNOSIS — K5792 Diverticulitis of intestine, part unspecified, without perforation or abscess without bleeding: Secondary | ICD-10-CM | POA: Diagnosis not present

## 2023-08-25 DIAGNOSIS — F900 Attention-deficit hyperactivity disorder, predominantly inattentive type: Secondary | ICD-10-CM | POA: Diagnosis not present

## 2023-08-25 DIAGNOSIS — F331 Major depressive disorder, recurrent, moderate: Secondary | ICD-10-CM | POA: Diagnosis not present

## 2023-08-25 DIAGNOSIS — F411 Generalized anxiety disorder: Secondary | ICD-10-CM | POA: Diagnosis not present

## 2023-08-25 MED ORDER — METRONIDAZOLE 500 MG PO TABS: 500.0000 mg | ORAL_TABLET | Freq: Two times a day (BID) | ORAL | 0 refills | Status: AC

## 2023-08-25 MED ORDER — CIPROFLOXACIN HCL 500 MG PO TABS: 500.0000 mg | ORAL_TABLET | Freq: Two times a day (BID) | ORAL | 0 refills | Status: AC

## 2023-08-25 MED ORDER — ONDANSETRON 4 MG PO TBDP: 4.0000 mg | ORAL_TABLET | Freq: Three times a day (TID) | ORAL | 0 refills | Status: DC | PRN

## 2023-08-25 NOTE — Progress Notes (Signed)
 Virtual Visit Consent   Ann Santos, you are scheduled for a virtual visit with a [facility] provider today. Just as with appointments in the office, your consent must be obtained to participate. Your consent will be active for this visit and any virtual visit you may have with one of our providers in the next 365 days. If you have a MyChart account, a copy of this consent can be sent to you electronically.  As this is a virtual visit, video technology does not allow for your provider to perform a traditional examination. This may limit your provider's ability to fully assess your condition. If your provider identifies any concerns that need to be evaluated in person or the need to arrange testing (such as labs, EKG, etc.), we will make arrangements to do so. Although advances in technology are sophisticated, we cannot ensure that it will always work on either your end or our end. If the connection with a video visit is poor, the visit may have to be switched to a telephone visit. With either a video or telephone visit, we are not always able to ensure that we have a secure connection.  By engaging in this virtual visit, you consent to the provision of healthcare and authorize for your insurance to be billed (if applicable) for the services provided during this visit. Depending on your insurance coverage, you may receive a charge related to this service.  I need to obtain your verbal consent now. Are you willing to proceed with your visit today? Ann Santos has provided verbal consent on 08/25/2023 for a virtual visit (video or telephone). Ann Huger, FNP  Date: 08/25/2023 10:04 AM   Virtual Visit via Video Note   I, Ann Santos, connected with  Ann Santos  ([id number], 27-Oct-1980) on 08/25/23 at 10:00 AM EDT by a video-enabled telemedicine application and verified that I am speaking with the correct person using two identifiers.  Location: Patient: Virtual Visit Location Patient:  Home Provider: Virtual Visit Location Provider: Home Office   I discussed the limitations of evaluation and management by telemedicine and the availability of in person appointments. The patient expressed understanding and agreed to proceed.    History of Present Illness: Ann Santos is a 43 y.o. who identifies as a female who was assigned female at birth, and is being seen today for LLQ pain for 2 days worsening. No fever, bloody stools. She has a history of diverticulitis and knows this feeling. She does have nausea. Requests cipro , flagyl , and zofran . In no distress. Ann Santos  HPI: HPI  Problems:  Patient Active Problem List   Diagnosis Date Noted   History of DVT (deep vein thrombosis) 08/29/2019   On continuous oral anticoagulation 08/29/2019   Morbid obesity (HCC) 11/17/2014   Diverticular disease 10/23/2012   Umbilical hernia 10/26/2011    Allergies: No Known Allergies Medications:  Current Outpatient Medications:    ciprofloxacin  (CIPRO ) 500 MG tablet, Take 1 tablet (500 mg total) by mouth 2 (two) times daily for 10 days., Disp: 20 tablet, Rfl: 0   metroNIDAZOLE  (FLAGYL ) 500 MG tablet, Take 1 tablet (500 mg total) by mouth 2 (two) times daily for 7 days., Disp: 14 tablet, Rfl: 0   ondansetron  (ZOFRAN -ODT) 4 MG disintegrating tablet, Take 1 tablet (4 mg total) by mouth every 8 (eight) hours as needed for nausea or vomiting., Disp: 20 tablet, Rfl: 0   acetaminophen  (TYLENOL ) 325 MG tablet, Take 650 mg by mouth every 6 (six) hours  as needed for headache., Disp: , Rfl:    ALPRAZolam (XANAX) 0.5 MG tablet, Take 0.5 mg by mouth at bedtime as needed for anxiety., Disp: , Rfl:    budesonide -formoterol  (SYMBICORT ) 160-4.5 MCG/ACT inhaler, Inhale 2 puffs into the lungs every 12 (twelve) hours., Disp: 1 each, Rfl: 0   CALCIUM PO, Take 1 tablet daily by mouth., Disp: , Rfl:    Cyanocobalamin  1000 MCG SUBL, Place under the tongue., Disp: , Rfl:    fluconazole  (DIFLUCAN ) 150 MG tablet, Take first  tablet on day 1.  Take second tablet 3 days after first tablet.  Take third tablet 3 days after second tablet., Disp: 3 tablet, Rfl: 0   guaifenesin  (HUMIBID E) 400 MG TABS tablet, Take 1 tablet 3 times daily as needed for chest congestion and cough, Disp: 30 tablet, Rfl: 0   lamoTRIgine (LAMICTAL) 150 MG tablet, Take 150 mg by mouth daily., Disp: , Rfl:    Lisdexamfetamine Dimesylate  (VYVANSE  PO), Take 60 mg by mouth daily., Disp: , Rfl:    LYSINE PO, Take 1 tablet by mouth daily., Disp: , Rfl:    Multiple Vitamins-Minerals (WOMENS MULTIVITAMIN) TABS, Take 1 tablet by mouth daily., Disp: , Rfl:    ondansetron  (ZOFRAN ) 4 MG tablet, Take 1 tablet (4 mg total) by mouth every 8 (eight) hours as needed for nausea or vomiting., Disp: 10 tablet, Rfl: 0   Probiotic Product (PROBIOTIC-10 PO), Probiotic, Disp: , Rfl:    promethazine -dextromethorphan (PROMETHAZINE -DM) 6.25-15 MG/5ML syrup, Take 5 mLs by mouth 4 (four) times daily as needed for cough., Disp: 118 mL, Rfl: 0   sertraline  (ZOLOFT ) 100 MG tablet, TAKE 2 TABLETS(200 MG) BY MOUTH DAILY, Disp: 180 tablet, Rfl: 1   traZODone  (DESYREL ) 100 MG tablet, Take 2 tablets (200 mg total) by mouth at bedtime., Disp: 180 tablet, Rfl: 3   valACYclovir  (VALTREX ) 500 MG tablet, TAKE 1 TABLET BY MOUTH TWICE A DAY, Disp: 20 tablet, Rfl: 5   WEGOVY 1.7 MG/0.75ML SOAJ, Inject 1.7 mg into the skin., Disp: , Rfl:   Observations/Objective: Patient is well-developed, well-nourished in no acute distress.  Resting comfortably  at home.  Head is normocephalic, atraumatic.  No labored breathing.  Speech is clear and coherent with logical content.  Patient is alert and oriented at baseline.    Assessment and Plan: 1. Diverticulitis (Primary)  Increase fluids, ED if sx worsen or fever devleops.   Follow Up Instructions: I discussed the assessment and treatment plan with the patient. The patient was provided an opportunity to ask questions and all were answered. The  patient agreed with the plan and demonstrated an understanding of the instructions.  A copy of instructions were sent to the patient via MyChart unless otherwise noted below.     The patient was advised to call back or seek an in-person evaluation if the symptoms worsen or if the condition fails to improve as anticipated.    Alizae Bechtel, FNP

## 2023-08-25 NOTE — Patient Instructions (Signed)
Diverticulitis  Diverticulitis happens when poop (stool) and bacteria get trapped in small pouches in the colon called diverticula. These pouches may form if you have a condition called diverticulosis. When the poop and bacteria get trapped, it can cause an infection and inflammation. Diverticulitis may cause severe stomach pain and diarrhea. It can also lead to tissue damage in your colon. This can cause bleeding or blockage. In some cases, the diverticula may burst (rupture). This can cause infected poop to go into other parts of your abdomen. What are the causes? This condition is caused by poop getting trapped in the diverticula. This allows bacteria to grow. It can lead to inflammation and infection. What increases the risk? You are more likely to get this condition if you have diverticulosis. You are also more at risk if: You are overweight or obese. You do not get enough exercise. You drink alcohol. You smoke. You eat a lot of red meat, such as beef, pork, or lamb. You do not get enough fiber. Foods high in fiber include fruits, vegetables, beans, nuts, and whole grains. You are over 35 years of age. What are the signs or symptoms? Symptoms of this condition may include: Pain and tenderness in the abdomen. This pain is often felt on the left side but may occur in other spots. Fever and chills. Nausea and vomiting. Cramping. Bloating. Changes in how often you poop. Blood in your poop. How is this diagnosed? This condition is diagnosed based on your medical history and a physical exam. You may also have tests done to make sure there is nothing else causing your condition. These tests may include: Blood tests. Tests done on your pee (urine). A CT scan of the abdomen. You may need to have a colonoscopy. This is an exam to look at your whole large intestine. During the exam, a tube is put into the opening of your butt (anus) and then moved into your rectum, colon, and other parts of  the large intestine. This exam is done to look at the diverticula. It can also see if there is something else that may be causing your symptoms. How is this treated? Most cases are mild and can be treated at home. You may be told to: Take over-the-counter pain medicine. Only eat and drink clear liquids. Take antibiotics. Rest. More severe cases may need to be treated at a hospital. Treatment may include: Not eating or drinking. Taking pain medicines. Getting antibiotics through an IV. Getting fluids and nutrition through an IV. Surgery. Follow these instructions at home: Medicines Take over-the-counter and prescription medicines only as told by your health care provider. These include fiber supplements, probiotics, and medicines to soften your poop (stool softeners). If you were prescribed antibiotics, take them as told by your provider. Do not stop using the antibiotic even if you start to feel better. Ask your provider if the medicine prescribed to you requires you to avoid driving or using machinery. Eating and drinking  Follow the diet told by your provider. You may need to only eat and drink liquids. After your symptoms get better, you may be able to return to a more normal diet. You may be told to eat at least 25 grams (25 g) of fiber each day. Fiber makes it easier to poop. Healthy sources of fiber include: Berries. One cup has 4-8 g of fiber. Beans or lentils. One-half cup has 5-8 g of fiber. Green vegetables. One cup has 4 g of fiber. Avoid eating red meat.  General instructions Do not use any products that contain nicotine or tobacco. These products include cigarettes, chewing tobacco, and vaping devices, such as e-cigarettes. If you need help quitting, ask your provider. Exercise for at least 30 minutes, 3 times a week. Exercise hard enough to raise your heart rate and break a sweat. Contact a health care provider if: Your pain gets worse. Your pooping does not go back to  normal. Your symptoms do not get better with treatment. Your symptoms get worse all of a sudden. You have a fever. You vomit more than one time. Your poop is bloody, black, or tarry. This information is not intended to replace advice given to you by your health care provider. Make sure you discuss any questions you have with your health care provider. Document Revised: 01/06/2022 Document Reviewed: 01/06/2022 Elsevier Patient Education  2024 [employer].

## 2023-08-30 DIAGNOSIS — Z6835 Body mass index (BMI) 35.0-35.9, adult: Secondary | ICD-10-CM | POA: Diagnosis not present

## 2023-08-30 DIAGNOSIS — K5792 Diverticulitis of intestine, part unspecified, without perforation or abscess without bleeding: Secondary | ICD-10-CM | POA: Diagnosis not present

## 2023-08-30 DIAGNOSIS — E785 Hyperlipidemia, unspecified: Secondary | ICD-10-CM | POA: Diagnosis not present

## 2023-09-14 ENCOUNTER — Telehealth: Admitting: Physician Assistant

## 2023-09-14 ENCOUNTER — Encounter: Payer: Self-pay | Admitting: Emergency Medicine

## 2023-09-14 DIAGNOSIS — K5792 Diverticulitis of intestine, part unspecified, without perforation or abscess without bleeding: Secondary | ICD-10-CM

## 2023-09-14 MED ORDER — ONDANSETRON 4 MG PO TBDP: 4.0000 mg | ORAL_TABLET | Freq: Three times a day (TID) | ORAL | 0 refills | Status: DC | PRN

## 2023-09-14 MED ORDER — FLUCONAZOLE 150 MG PO TABS: ORAL_TABLET | ORAL | 0 refills | Status: DC

## 2023-09-14 MED ORDER — AMOXICILLIN-POT CLAVULANATE 875-125 MG PO TABS: 1.0000 | ORAL_TABLET | Freq: Two times a day (BID) | ORAL | 0 refills | Status: DC

## 2023-09-14 NOTE — Patient Instructions (Signed)
 Ann Santos, thank you for joining Ann Maillard, PA-C for today's virtual visit.  While this provider is not your primary care provider (PCP), if your PCP is located in our provider database this encounter information will be shared with them immediately following your visit.   A [facility] MyChart account gives you access to today's visit and all your visits, tests, and labs performed at [facility] " click here if you don't have a [facility] MyChart account or go to [email or web address]  Consent: (Patient) Ann Santos provided verbal consent for this virtual visit at the beginning of the encounter.  Current Medications:  Current Outpatient Medications:    amoxicillin -clavulanate (AUGMENTIN ) 875-125 MG tablet, Take 1 tablet by mouth 2 (two) times daily., Disp: 14 tablet, Rfl: 0   ondansetron  (ZOFRAN -ODT) 4 MG disintegrating tablet, Take 1 tablet (4 mg total) by mouth every 8 (eight) hours as needed for nausea or vomiting., Disp: 20 tablet, Rfl: 0   acetaminophen  (TYLENOL ) 325 MG tablet, Take 650 mg by mouth every 6 (six) hours as needed for headache., Disp: , Rfl:    ALPRAZolam (XANAX) 0.5 MG tablet, Take 0.5 mg by mouth at bedtime as needed for anxiety., Disp: , Rfl:    budesonide -formoterol  (SYMBICORT ) 160-4.5 MCG/ACT inhaler, Inhale 2 puffs into the lungs every 12 (twelve) hours., Disp: 1 each, Rfl: 0   CALCIUM PO, Take 1 tablet daily by mouth., Disp: , Rfl:    Cyanocobalamin  1000 MCG SUBL, Place under the tongue., Disp: , Rfl:    fluconazole  (DIFLUCAN ) 150 MG tablet, Take first tablet on day 1.  Take second tablet 3 days after first tablet.  Take third tablet 3 days after second tablet., Disp: 3 tablet, Rfl: 0   guaifenesin  (HUMIBID E) 400 MG TABS tablet, Take 1 tablet 3 times daily as needed for chest congestion and cough, Disp: 30 tablet, Rfl: 0   lamoTRIgine (LAMICTAL) 150 MG tablet, Take 150 mg by mouth daily., Disp: , Rfl:    Lisdexamfetamine Dimesylate   (VYVANSE  PO), Take 60 mg by mouth daily., Disp: , Rfl:    LYSINE PO, Take 1 tablet by mouth daily., Disp: , Rfl:    Multiple Vitamins-Minerals (WOMENS MULTIVITAMIN) TABS, Take 1 tablet by mouth daily., Disp: , Rfl:    Probiotic Product (PROBIOTIC-10 PO), Probiotic, Disp: , Rfl:    promethazine -dextromethorphan (PROMETHAZINE -DM) 6.25-15 MG/5ML syrup, Take 5 mLs by mouth 4 (four) times daily as needed for cough., Disp: 118 mL, Rfl: 0   sertraline  (ZOLOFT ) 100 MG tablet, TAKE 2 TABLETS(200 MG) BY MOUTH DAILY, Disp: 180 tablet, Rfl: 1   traZODone  (DESYREL ) 100 MG tablet, Take 2 tablets (200 mg total) by mouth at bedtime., Disp: 180 tablet, Rfl: 3   valACYclovir  (VALTREX ) 500 MG tablet, TAKE 1 TABLET BY MOUTH TWICE A DAY, Disp: 20 tablet, Rfl: 5   WEGOVY 1.7 MG/0.75ML SOAJ, Inject 1.7 mg into the skin., Disp: , Rfl:    Medications ordered in this encounter:  Meds ordered this encounter  Medications   amoxicillin -clavulanate (AUGMENTIN ) 875-125 MG tablet    Sig: Take 1 tablet by mouth 2 (two) times daily.    Dispense:  14 tablet    Refill:  0    Supervising Provider:   LAMPTEY, Ann O [[id number]]   ondansetron  (ZOFRAN -ODT) 4 MG disintegrating tablet    Sig: Take 1 tablet (4 mg total) by mouth every 8 (eight) hours as needed for nausea or vomiting.    Dispense:  20 tablet  Refill:  0    Supervising Provider:   Corine Santos [[id number]]   fluconazole  (DIFLUCAN ) 150 MG tablet    Sig: Take first tablet on day 1.  Take second tablet 3 days after first tablet.  Take third tablet 3 days after second tablet.    Dispense:  3 tablet    Refill:  0    Supervising Provider:   LAMPTEY, Ann O [[id number]]     *If you need refills on other medications prior to your next appointment, please contact your pharmacy*  Follow-Up: Call back or seek an in-person evaluation if the symptoms worsen or if the condition fails to improve as anticipated.  [facility] [phone]  Other  Instructions Take the Augmentin  twice daily as directed. Ok to use the zofran  a directed, when needed. Keep liquid diet until symptoms are resolving. Tylenol  OTC for pain. ER for any worsening symptoms despite treatment.   If you have been instructed to have an in-person evaluation today at a local Urgent Care facility, please use the link below. It will take you to a list of all of our available [facility], including address, phone number and hours of operation. Please do not delay care.  [facility]  If you or a family member do not have a primary care provider, use the link below to schedule a visit and establish care. When you choose a [facility] primary care physician or advanced practice provider, you gain a long-term partner in health. Find a Primary Care Provider  Learn more about [facility] in-office and virtual care options: [facility] - Get Care Now

## 2023-09-14 NOTE — Progress Notes (Signed)
 Virtual Visit Consent   Ann Santos, you are scheduled for a virtual visit with a [facility] provider today. Just as with appointments in the office, your consent must be obtained to participate. Your consent will be active for this visit and any virtual visit you may have with one of our providers in the next 365 days. If you have a MyChart account, a copy of this consent can be sent to you electronically.  As this is a virtual visit, video technology does not allow for your provider to perform a traditional examination. This may limit your provider's ability to fully assess your condition. If your provider identifies any concerns that need to be evaluated in person or the need to arrange testing (such as labs, EKG, etc.), we will make arrangements to do so. Although advances in technology are sophisticated, we cannot ensure that it will always work on either your end or our end. If the connection with a video visit is poor, the visit may have to be switched to a telephone visit. With either a video or telephone visit, we are not always able to ensure that we have a secure connection.  By engaging in this virtual visit, you consent to the provision of healthcare and authorize for your insurance to be billed (if applicable) for the services provided during this visit. Depending on your insurance coverage, you may receive a charge related to this service.  I need to obtain your verbal consent now. Are you willing to proceed with your visit today? Ann Santos has provided verbal consent on 09/14/2023 for a virtual visit (video or telephone). Ann Santos, [place]  Date: 09/14/2023 7:57 PM   Virtual Visit via Video Note   I, Ann Santos, connected with  Ann Santos  ([id number], 18-Nov-1980) on 09/14/23 at  7:45 PM EDT by a video-enabled telemedicine application and verified that I am speaking with the correct person using two identifiers.  Location: Patient: Virtual Visit Location  Patient: Home Provider: Virtual Visit Location Provider: Home Office   I discussed the limitations of evaluation and management by telemedicine and the availability of in person appointments. The patient expressed understanding and agreed to proceed.    History of Present Illness: Ann Santos is a 43 y.o. who identifies as a female who was assigned female at birth, and is being seen today for a possible flare of her diverticulitis. Notes being treated for acute diverticulitis about 3 weeks ago with course of Cipro  and Flagyl . Notes she did not complete course of medicine as she felt things were resolving. Notes since then she has lost her pill bottle. Has noted some mild to moderate pain at times, all LLQ, about 4-5/10. Some nausea. Denies fever, chills, vomiting, melena or hematochezia. Giving history has upcoming appt with surgeon but not until August.     HPI: HPI  Problems:  Patient Active Problem List   Diagnosis Date Noted   History of DVT (deep vein thrombosis) 08/29/2019   On continuous oral anticoagulation 08/29/2019   Morbid obesity (HCC) 11/17/2014   Diverticular disease 10/23/2012   Umbilical hernia 10/26/2011    Allergies: No Known Allergies Medications:  Current Outpatient Medications:    amoxicillin -clavulanate (AUGMENTIN ) 875-125 MG tablet, Take 1 tablet by mouth 2 (two) times daily., Disp: 14 tablet, Rfl: 0   ondansetron  (ZOFRAN -ODT) 4 MG disintegrating tablet, Take 1 tablet (4 mg total) by mouth every 8 (eight) hours as needed for nausea or vomiting., Disp: 20 tablet,  Rfl: 0   acetaminophen  (TYLENOL ) 325 MG tablet, Take 650 mg by mouth every 6 (six) hours as needed for headache., Disp: , Rfl:    ALPRAZolam (XANAX) 0.5 MG tablet, Take 0.5 mg by mouth at bedtime as needed for anxiety., Disp: , Rfl:    budesonide -formoterol  (SYMBICORT ) 160-4.5 MCG/ACT inhaler, Inhale 2 puffs into the lungs every 12 (twelve) hours., Disp: 1 each, Rfl: 0   CALCIUM PO, Take 1 tablet daily  by mouth., Disp: , Rfl:    Cyanocobalamin  1000 MCG SUBL, Place under the tongue., Disp: , Rfl:    fluconazole  (DIFLUCAN ) 150 MG tablet, Take first tablet on day 1.  Take second tablet 3 days after first tablet.  Take third tablet 3 days after second tablet., Disp: 3 tablet, Rfl: 0   guaifenesin  (HUMIBID E) 400 MG TABS tablet, Take 1 tablet 3 times daily as needed for chest congestion and cough, Disp: 30 tablet, Rfl: 0   lamoTRIgine (LAMICTAL) 150 MG tablet, Take 150 mg by mouth daily., Disp: , Rfl:    Lisdexamfetamine Dimesylate  (VYVANSE  PO), Take 60 mg by mouth daily., Disp: , Rfl:    LYSINE PO, Take 1 tablet by mouth daily., Disp: , Rfl:    Multiple Vitamins-Minerals (WOMENS MULTIVITAMIN) TABS, Take 1 tablet by mouth daily., Disp: , Rfl:    Probiotic Product (PROBIOTIC-10 PO), Probiotic, Disp: , Rfl:    promethazine -dextromethorphan (PROMETHAZINE -DM) 6.25-15 MG/5ML syrup, Take 5 mLs by mouth 4 (four) times daily as needed for cough., Disp: 118 mL, Rfl: 0   sertraline  (ZOLOFT ) 100 MG tablet, TAKE 2 TABLETS(200 MG) BY MOUTH DAILY, Disp: 180 tablet, Rfl: 1   traZODone  (DESYREL ) 100 MG tablet, Take 2 tablets (200 mg total) by mouth at bedtime., Disp: 180 tablet, Rfl: 3   valACYclovir  (VALTREX ) 500 MG tablet, TAKE 1 TABLET BY MOUTH TWICE A DAY, Disp: 20 tablet, Rfl: 5   WEGOVY 1.7 MG/0.75ML SOAJ, Inject 1.7 mg into the skin., Disp: , Rfl:   Observations/Objective: Patient is well-developed, well-nourished in no acute distress.  Resting comfortably  at home.  Head is normocephalic, atraumatic.  No labored breathing.  Speech is clear and coherent with logical content.  Patient is alert and oriented at baseline.   Assessment and Plan: 1. Acute diverticulitis (Primary) - amoxicillin -clavulanate (AUGMENTIN ) 875-125 MG tablet; Take 1 tablet by mouth 2 (two) times daily.  Dispense: 14 tablet; Refill: 0 - ondansetron  (ZOFRAN -ODT) 4 MG disintegrating tablet; Take 1 tablet (4 mg total) by mouth every 8  (eight) hours as needed for nausea or vomiting.  Dispense: 20 tablet; Refill: 0  Did not complete last course of medicine. Now with recurring symptoms. No alarm signs or symptoms present. Concern for some resistance to Cipro /Flagyl  due to incomplete course of medicine/treatment of infection. Will Rx Augmentin  875 BID x 7 days. Zofran  per orders. PCP follow-up for any non-resolving symptoms. ER precautions reviewed.  Follow Up Instructions: I discussed the assessment and treatment plan with the patient. The patient was provided an opportunity to ask questions and all were answered. The patient agreed with the plan and demonstrated an understanding of the instructions.  A copy of instructions were sent to the patient via MyChart unless otherwise noted below.   The patient was advised to call back or seek an in-person evaluation if the symptoms worsen or if the condition fails to improve as anticipated.    Ann Maillard, PA-C

## 2023-09-14 NOTE — Telephone Encounter (Signed)
 Needs to be seen either at the office or urgent care center.

## 2023-10-01 ENCOUNTER — Ambulatory Visit
Admission: RE | Admit: 2023-10-01 | Discharge: 2023-10-01 | Disposition: A | Discharge status: Home / Self Care | Source: Ambulatory Visit | Attending: Family Medicine | Admitting: Family Medicine

## 2023-10-01 VITALS — BP 156/92 | HR 81 | Temp 99.5°F | Resp 20

## 2023-10-01 DIAGNOSIS — H6993 Unspecified Eustachian tube disorder, bilateral: Secondary | ICD-10-CM

## 2023-10-01 DIAGNOSIS — R0981 Nasal congestion: Secondary | ICD-10-CM | POA: Diagnosis not present

## 2023-10-01 MED ORDER — OMEPRAZOLE 20 MG PO CPDR
20.0000 mg | DELAYED_RELEASE_CAPSULE | Freq: Every day | ORAL | 0 refills | Status: AC
Start: 2023-10-01 — End: ?

## 2023-10-01 MED ORDER — PREDNISONE 20 MG PO TABS: ORAL_TABLET | ORAL | 0 refills | Status: DC

## 2023-10-01 MED ORDER — FAMOTIDINE 20 MG PO TABS: 20.0000 mg | ORAL_TABLET | Freq: Two times a day (BID) | ORAL | 0 refills | Status: AC

## 2023-10-01 NOTE — ED Provider Notes (Signed)
 [facility]  Note:  This document was prepared using Conservation officer, historic buildings and may include unintentional dictation errors.  MRN: [id number] DOB: 1981/04/24  Subjective:   Ann Santos is a 43 y.o. female presenting for 3 to 4-day history of acute onset bilateral ear discomfort, popping, fullness, malaise, fatigue, nausea without vomiting, diarrhea.  Patient was just treated for diverticulitis.  Initially she started ciprofloxacin  and Flagyl  but was switched to Augmentin .  She finished that course about 5 or 6 days ago.  Has a history of gastric bypass.  No bloody stools, fever, sinus pain, chest pain, shortness of breath, coughing.  No current facility-administered medications for this encounter.  Current Outpatient Medications:    acetaminophen  (TYLENOL ) 325 MG tablet, Take 650 mg by mouth every 6 (six) hours as needed for headache., Disp: , Rfl:    ALPRAZolam (XANAX) 0.5 MG tablet, Take 0.5 mg by mouth at bedtime as needed for anxiety., Disp: , Rfl:    amoxicillin -clavulanate (AUGMENTIN ) 875-125 MG tablet, Take 1 tablet by mouth 2 (two) times daily., Disp: 14 tablet, Rfl: 0   budesonide -formoterol  (SYMBICORT ) 160-4.5 MCG/ACT inhaler, Inhale 2 puffs into the lungs every 12 (twelve) hours., Disp: 1 each, Rfl: 0   CALCIUM PO, Take 1 tablet daily by mouth., Disp: , Rfl:    Cyanocobalamin  1000 MCG SUBL, Place under the tongue., Disp: , Rfl:    fluconazole  (DIFLUCAN ) 150 MG tablet, Take first tablet on day 1.  Take second tablet 3 days after first tablet.  Take third tablet 3 days after second tablet., Disp: 3 tablet, Rfl: 0   guaifenesin  (HUMIBID E) 400 MG TABS tablet, Take 1 tablet 3 times daily as needed for chest congestion and cough, Disp: 30 tablet, Rfl: 0   lamoTRIgine (LAMICTAL) 150 MG tablet, Take 150 mg by mouth daily., Disp: , Rfl:    Lisdexamfetamine Dimesylate  (VYVANSE  PO), Take 60 mg by mouth daily., Disp: , Rfl:    LYSINE PO, Take 1 tablet by  mouth daily., Disp: , Rfl:    Multiple Vitamins-Minerals (WOMENS MULTIVITAMIN) TABS, Take 1 tablet by mouth daily., Disp: , Rfl:    ondansetron  (ZOFRAN -ODT) 4 MG disintegrating tablet, Take 1 tablet (4 mg total) by mouth every 8 (eight) hours as needed for nausea or vomiting., Disp: 20 tablet, Rfl: 0   Probiotic Product (PROBIOTIC-10 PO), Probiotic, Disp: , Rfl:    promethazine -dextromethorphan (PROMETHAZINE -DM) 6.25-15 MG/5ML syrup, Take 5 mLs by mouth 4 (four) times daily as needed for cough., Disp: 118 mL, Rfl: 0   sertraline  (ZOLOFT ) 100 MG tablet, TAKE 2 TABLETS(200 MG) BY MOUTH DAILY, Disp: 180 tablet, Rfl: 1   traZODone  (DESYREL ) 100 MG tablet, Take 2 tablets (200 mg total) by mouth at bedtime., Disp: 180 tablet, Rfl: 3   valACYclovir  (VALTREX ) 500 MG tablet, TAKE 1 TABLET BY MOUTH TWICE A DAY, Disp: 20 tablet, Rfl: 5   WEGOVY 1.7 MG/0.75ML SOAJ, Inject 1.7 mg into the skin., Disp: , Rfl:    No Known Allergies  Past Medical History:  Diagnosis Date   Abdominal distention    Abdominal pain    Anxiety    Blood in stool    Constipation    Diverticulitis    Per pt. States she never had CT scan to confirm this   Diverticulosis    PT HOSPITALIZED AT [facility] October 03, 2011   Gestational hypertension 07/05/2013   Gestational hypertension 07/05/2013   History of chicken pox    childhood  History of hiatal hernia    Hypertension    During pregnancy only   Maternal obesity, antepartum 07/05/2013   Maternal obesity, antepartum 07/05/2013   Numbness    first two fingers right hand    Rectal bleeding    Shortness of breath dyspnea    with exercise   Umbilical hernia    planning surgical repair     Past Surgical History:  Procedure Laterality Date   ABDOMINOPLASTY     w/ hernia revision 2020 @ [facility]   BREATH TEK H PYLORI N/A 07/22/2014   Procedure: BREATH TEK H PYLORI;  Surgeon: Juanita Norlander, MD;  Location: [facility];  Service: General;  Laterality: N/A;   CESAREAN SECTION   05/28/2009   CESAREAN SECTION N/A 07/05/2013   Procedure: REPEAT CESAREAN SECTION (With Special Wound Vac);  Surgeon: Shasta Deist, MD;  Location: [facility];  Service: Obstetrics;  Laterality: N/A;   EDD: 07/23/13   COLONOSCOPY  10/07/2011   Procedure: COLONOSCOPY;  Surgeon: Almeda Aris, MD;  Location: [facility];  Service: Endoscopy;  Laterality: N/A;   ESOPHAGOGASTRODUODENOSCOPY  10/07/2011   Procedure: ESOPHAGOGASTRODUODENOSCOPY (EGD);  Surgeon: Almeda Aris, MD;  Location: [facility];  Service: Endoscopy;  Laterality: N/A;   HERNIA REPAIR     LAPAROSCOPIC GASTRIC SLEEVE RESECTION WITH HIATAL HERNIA REPAIR N/A 11/17/2014   Procedure: LAPAROSCOPIC GASTRIC SLEEVE RESECTION ;  Surgeon: Juanita Norlander, MD;  Location: [facility];  Service: General;  Laterality: N/A;   SCAR REVISION  09/17/2009   c-section scar revision   UMBILICAL HERNIA REPAIR  11/01/2011   Procedure: HERNIA REPAIR UMBILICAL ADULT;  Surgeon: Keitha Pata, MD;  Location: [facility];  Service: General;  Laterality: N/A;    Family History  Problem Relation Age of Onset   Diabetes Mother    Hypertension Mother    Ulcerative colitis Father    Hypertension Father    Cancer Maternal Grandmother        lung    Social History   Tobacco Use   Smoking status: Never   Smokeless tobacco: Never  Vaping Use   Vaping status: Never Used  Substance Use Topics   Alcohol use: Yes    Comment: occasionally   Drug use: No    ROS   Objective:   Vitals: BP (!) 156/92 (BP Location: Right Arm)   Pulse 81   Temp 99.5 F (37.5 C) (Oral)   Resp 20   SpO2 98%   Physical Exam Constitutional:      General: She is not in acute distress.    Appearance: Normal appearance. She is well-developed and normal weight. She is not ill-appearing, toxic-appearing or diaphoretic.  HENT:     Head: Normocephalic and atraumatic.     Right Ear: Ear canal and external ear normal. No drainage or tenderness. No middle ear effusion. There is no  impacted cerumen. Tympanic membrane is not erythematous or bulging.     Left Ear: Ear canal and external ear normal. No drainage or tenderness.  No middle ear effusion. There is no impacted cerumen. Tympanic membrane is not erythematous or bulging.     Ears:     Comments: Bilateral middle ear effusions.    Nose: No congestion or rhinorrhea.     Mouth/Throat:     Mouth: Mucous membranes are moist. No oral lesions.     Pharynx: No pharyngeal swelling, oropharyngeal exudate, posterior oropharyngeal erythema or uvula swelling.     Tonsils: No tonsillar exudate or tonsillar  abscesses.     Comments: Significant postnasal drainage overlying pharynx. Eyes:     General: No scleral icterus.       Right eye: No discharge.        Left eye: No discharge.     Extraocular Movements: Extraocular movements intact.     Right eye: Normal extraocular motion.     Left eye: Normal extraocular motion.     Conjunctiva/sclera: Conjunctivae normal.  Cardiovascular:     Rate and Rhythm: Normal rate.  Pulmonary:     Effort: Pulmonary effort is normal.  Musculoskeletal:     Cervical back: Normal range of motion and neck supple.  Lymphadenopathy:     Cervical: No cervical adenopathy.  Skin:    General: Skin is warm and dry.  Neurological:     General: No focal deficit present.     Mental Status: She is alert and oriented to person, place, and time.  Psychiatric:        Mood and Affect: Mood normal.        Behavior: Behavior normal.     Assessment and Plan :   PDMP not reviewed this encounter.  1. Eustachian tube dysfunction, bilateral   2. Sinus congestion    Low suspicion for C. difficile.  Will defer testing for now given lack of bloody stools.  Recommended an oral prednisone  course given her significant upper respiratory symptoms, eustachian tube dysfunction.  Patient is in agreement.  Discussed the risk of using steroids in the setting of having gastric bypass.  I will have patient take Prilosec  and famotidine for GI protection.  Counseled patient on potential for adverse effects with medications prescribed/recommended today, ER and return-to-clinic precautions discussed, patient verbalized understanding.    Adolph Hoop, PA-C 10/01/23 1322

## 2023-10-01 NOTE — ED Triage Notes (Addendum)
 Pt c/o fatigue, n/d, ears popping, neck pain-sx started 5/6 days-NAD-steady gait-pt added she had recent course abx with flagyl  and cipro /not completed then completed augmentin 

## 2023-10-05 ENCOUNTER — Ambulatory Visit: Admitting: Radiology

## 2023-10-05 ENCOUNTER — Ambulatory Visit
Admission: RE | Admit: 2023-10-05 | Discharge: 2023-10-05 | Disposition: A | Discharge status: Home / Self Care | Source: Ambulatory Visit | Attending: Internal Medicine | Admitting: Internal Medicine

## 2023-10-05 VITALS — BP 158/95 | HR 83 | Temp 98.3°F | Resp 17

## 2023-10-05 DIAGNOSIS — R0989 Other specified symptoms and signs involving the circulatory and respiratory systems: Secondary | ICD-10-CM | POA: Diagnosis not present

## 2023-10-05 DIAGNOSIS — J209 Acute bronchitis, unspecified: Secondary | ICD-10-CM | POA: Diagnosis not present

## 2023-10-05 DIAGNOSIS — R0602 Shortness of breath: Secondary | ICD-10-CM

## 2023-10-05 DIAGNOSIS — R059 Cough, unspecified: Secondary | ICD-10-CM | POA: Diagnosis not present

## 2023-10-05 LAB — POC SARS CORONAVIRUS 2 AG -  ED: SARS Coronavirus 2 Ag: NEGATIVE

## 2023-10-05 MED ORDER — ALBUTEROL SULFATE HFA 108 (90 BASE) MCG/ACT IN AERS: 2.0000 | INHALATION_SPRAY | Freq: Four times a day (QID) | RESPIRATORY_TRACT | 0 refills | Status: AC | PRN

## 2023-10-05 MED ORDER — PROMETHAZINE-DM 6.25-15 MG/5ML PO SYRP: 5.0000 mL | ORAL_SOLUTION | Freq: Every evening | ORAL | 0 refills | Status: AC | PRN

## 2023-10-05 MED ORDER — AZITHROMYCIN 250 MG PO TABS: 250.0000 mg | ORAL_TABLET | Freq: Every day | ORAL | 0 refills | Status: AC

## 2023-10-05 MED ORDER — PREDNISONE 20 MG PO TABS: ORAL_TABLET | ORAL | 0 refills | Status: AC

## 2023-10-05 MED ORDER — ALBUTEROL SULFATE (2.5 MG/3ML) 0.083% IN NEBU
2.5000 mg | INHALATION_SOLUTION | Freq: Once | RESPIRATORY_TRACT | Status: AC
  Administered 2023-10-05: 2.5 mg via RESPIRATORY_TRACT

## 2023-10-05 NOTE — Discharge Instructions (Addendum)
 You have bronchitis which is inflammation of the upper airways in your lungs due to a virus.   Take azithromycin  antibiotic as prescribed to cover for atypical infection to the chest.  COVID test was negative in clinic.   We will treat this with steroids. Do not take any NSAIDs with steroid pills (no ibuprofen , naproxen  while taking steroid, this could cause stomach upset).  Take your last dose of the prednisone  prescribed by previous provider (40mg ) today, then take 2 more days of 40mg  (2 pills), then take 20 mg (1 tablet) for 3 more days until finished.   Use albuterol  every 4-6 hours as needed for cough, shortness of breath, and wheezing.   Use guaifenesin  (plain mucinex ) to break up congestion in nose/chest so that you are able to excrete easier. Drink plenty of fluids to stay well hydrated while taking mucinex  so that it works well in the body.   If you develop any new or worsening symptoms or if your symptoms do not start to improve, please return here or follow-up with your primary care provider. If your symptoms are severe, please go to the emergency room.

## 2023-10-05 NOTE — ED Triage Notes (Signed)
 Pt went to urgent care Sunday and dx with double ear infection. Given rx for steriod.  Presents today due to worse cough, sob, and loss of voice.   Symptoms began Tuesday last week

## 2023-10-05 NOTE — ED Provider Notes (Signed)
 [facility]    CSN: [id number] Arrival date & time: 10/05/23  0859      History   Chief Complaint Chief Complaint  Patient presents with   Cough    Went to urgent care Sunday double ear infections got steroids can't stop coughing lost my voice now not getting better - Entered by patient    HPI Ann Santos is a 43 y.o. female.   Ann Santos is a 43 y.o. female presenting for chief complaint of cough, congestion, and voice hoarseness that started 3 days ago on October 02, 2023. Cough has progressively worsened over the last 3 days. Cough is dry and worse at nighttime. 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 Reports shortness of breath and bilateral chest tightness associated with coughing. 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 Denies recent nausea, vomiting, diarrhea, abdominal pain, and rash. Denies history of COPD/asthma. Never smoker. She has been prescribed an inhaler in the past for bronchitis.  She is on day 5/5 of prednisone  burst to treat eustachian tube dysfunction prescribed on Sunday October 01, 2023. She reports this as double ear infections, however per chart review provider felt ear pain to be eustachian tube dysfunction.  Additionally reports recent ciprofloxacin , flagyl , and Augmentin  in the last 90 days to treat diverticulitis flare.     Cough   Past Medical History:  Diagnosis Date   Abdominal distention    Abdominal pain    Anxiety    Blood in stool    Constipation    Diverticulitis    Per pt. States she never had CT scan to confirm this   Diverticulosis    PT H[facility]PITALIZED AT [facility] October 03, 2011   Gestational hypertension 07/05/2013   Gestational hypertension 07/05/2013   History of chicken pox    childhood   History of hiatal hernia    Hypertension    During pregnancy only   Maternal obesity, antepartum  07/05/2013   Maternal obesity, antepartum 07/05/2013   Numbness    first two fingers right hand    Rectal bleeding    Shortness of breath dyspnea    with exercise   Umbilical hernia    planning surgical repair    Patient Active Problem List   Diagnosis Date Noted   History of DVT (deep vein thrombosis) 08/29/2019   On continuous oral anticoagulation 08/29/2019   Morbid obesity (HCC) 11/17/2014   Diverticular disease 10/23/2012   Umbilical hernia 10/26/2011    Past Surgical History:  Procedure Laterality Date   ABDOMINOPLASTY     w/ hernia revision 2020 @ [facility]   BREATH TEK H PYLORI N/A 07/22/2014   Procedure: BREATH TEK H PYLORI;  Surgeon: Juanita Norlander, MD;  Location: [facility];  Service: General;  Laterality: N/A;   CESAREAN SECTION  05/28/2009   CESAREAN SECTION N/A 07/05/2013   Procedure: REPEAT CESAREAN SECTION (With Special Wound Vac);  Surgeon: Shasta Deist, MD;  Location: [facility];  Service: Obstetrics;  Laterality: N/A;   EDD: 07/23/13   COLONOSCOPY  10/07/2011   Procedure: COLONOSCOPY;  Surgeon: Almeda Aris, MD;  Location: [facility];  Service: Endoscopy;  Laterality: N/A;   ESOPHAGOGASTRODUODENOSCOPY  10/07/2011   Procedure: ESOPHAGOGASTRODUODENOSCOPY (EGD);  Surgeon: Almeda Aris, MD;  Location: [facility];  Service: Endoscopy;  Laterality: N/A;   HERNIA  REPAIR     LAPAROSCOPIC GASTRIC SLEEVE RESECTION WITH HIATAL HERNIA REPAIR N/A 11/17/2014   Procedure: LAPAROSCOPIC GASTRIC SLEEVE RESECTION ;  Surgeon: Juanita Norlander, MD;  Location: [facility];  Service: General;  Laterality: N/A;   SCAR REVISION  09/17/2009   c-section scar revision   UMBILICAL HERNIA REPAIR  11/01/2011   Procedure: HERNIA REPAIR UMBILICAL ADULT;  Surgeon: Keitha Pata, MD;  Location: [facility];  Service: General;  Laterality: N/A;    OB History     Gravida  2   Para  2   Term  2   Preterm      AB      Living  2      SAB      IAB      Ectopic      Multiple      Live  Births  2            Home Medications    Prior to Admission medications   Medication Sig Start Date End Date Taking? Authorizing Provider  albuterol  (VENTOLIN  HFA) 108 (90 Base) MCG/ACT inhaler Inhale 2 puffs into the lungs every 6 (six) hours as needed for wheezing or shortness of breath. 10/05/23  Yes Starlene Eaton, FNP  azithromycin  (ZITHROMAX ) 250 MG tablet Take 1 tablet (250 mg total) by mouth daily. Take first 2 tablets together, then 1 every day until finished. 10/05/23  Yes Starlene Eaton, FNP  predniSONE  (DELTASONE ) 20 MG tablet Take 2 tablets (40 mg total) by mouth daily with breakfast for 2 days, THEN 1 tablet (20 mg total) daily with breakfast for 3 days. 10/05/23 10/10/23 Yes StanhopeDanny Dye, FNP  promethazine -dextromethorphan (PROMETHAZINE -DM) 6.25-15 MG/5ML syrup Take 5 mLs by mouth at bedtime as needed for cough. 10/05/23  Yes Starlene Eaton, FNP  acetaminophen  (TYLENOL ) 325 MG tablet Take 650 mg by mouth every 6 (six) hours as needed for headache.    [provider]  ALPRAZolam (XANAX) 0.5 MG tablet Take 0.5 mg by mouth at bedtime as needed for anxiety.    [provider]  amoxicillin -clavulanate (AUGMENTIN ) 875-125 MG tablet Take 1 tablet by mouth 2 (two) times daily. 09/14/23   Farris Hong, PA-C  budesonide -formoterol  (SYMBICORT ) 160-4.5 MCG/ACT inhaler Inhale 2 puffs into the lungs every 12 (twelve) hours. Patient not taking: Reported on 10/05/2023 06/01/23   Eloise Hake Scales, PA-C  CALCIUM PO Take 1 tablet daily by mouth.    [provider]  Cyanocobalamin  1000 MCG SUBL Place under the tongue.    [provider]  famotidine (PEPCID) 20 MG tablet Take 1 tablet (20 mg total) by mouth 2 (two) times daily. 10/01/23   Adolph Hoop, PA-C  fluconazole  (DIFLUCAN ) 150 MG tablet Take first tablet on day 1.  Take second tablet 3 days after first tablet.  Take third tablet 3 days after second tablet. 09/14/23   Farris Hong, PA-C  guaifenesin  (HUMIBID E) 400 MG TABS tablet Take 1 tablet 3 times daily as needed for chest congestion and cough 06/01/23   Eloise Hake Scales, PA-C  lamoTRIgine (LAMICTAL) 150 MG tablet Take 150 mg by mouth daily.    [provider]  Lisdexamfetamine Dimesylate  (VYVANSE  PO) Take 60 mg by mouth daily. 05/17/23   [provider]  LYSINE PO Take 1 tablet by mouth daily.    [provider]  Multiple Vitamins-Minerals (WOMENS MULTIVITAMIN) TABS Take 1 tablet by mouth daily.    [provider]  omeprazole (PRILOSEC) 20 MG capsule Take 1 capsule (20 mg total) by mouth daily. 10/01/23   Adolph Hoop, PA-C  ondansetron  (ZOFRAN -ODT) 4 MG disintegrating tablet Take 1 tablet (4 mg total) by mouth every 8 (eight) hours as needed for nausea or vomiting. 09/14/23   Farris Hong, PA-C  Probiotic Product (PROBIOTIC-10 PO) Probiotic    [provider]  sertraline  (ZOLOFT ) 100 MG tablet TAKE 2 TABLETS(200 MG) BY MOUTH DAILY 06/27/23   Elvira Hammersmith, MD  traZODone  (DESYREL ) 100 MG tablet Take 2 tablets (200 mg total) by mouth at bedtime. 01/11/23 01/06/24  Elvira Hammersmith, MD  valACYclovir  (VALTREX ) 500 MG tablet TAKE 1 TABLET BY MOUTH TWICE A DAY 08/07/22   Sagardia, Isidro Margo, MD  WEGOVY 1.7 MG/0.75ML SOAJ Inject 1.7 mg into the skin. 10/31/22   [provider]    Family History Family History  Problem Relation Age of Onset   Diabetes Mother    Hypertension Mother    Ulcerative colitis Father    Hypertension Father    Cancer Maternal Grandmother        lung    Social History Social History   Tobacco Use   Smoking status: Never   Smokeless tobacco: Never  Vaping Use   Vaping status: Never Used  Substance Use Topics   Alcohol use: Yes    Comment: occasionally   Drug use: No     Allergies   Patient has no known allergies.   Review of Systems Review of Systems  Respiratory:  Positive for cough.   Per  HPI  Physical Exam Triage Vital Signs ED Triage Vitals  Encounter Vitals Group     BP 10/05/23 0912 (!) 158/95     Girls Systolic BP Percentile --      Girls Diastolic BP Percentile --      Boys Systolic BP Percentile --      Boys Diastolic BP Percentile --      Pulse Rate 10/05/23 0912 83     Resp 10/05/23 0912 17     Temp 10/05/23 0912 98.3 F (36.8 C)     Temp Source 10/05/23 0912 Oral     SpO2 10/05/23 0912 94 %     Weight --      Height --      Head Circumference --      Peak Flow --      Pain Score 10/05/23 0917 4     Pain Loc --      Pain Education --      Exclude from Growth Chart --    No data found.  Updated Vital Signs BP (!) 158/95 (BP Location: Right Arm)   Pulse 83   Temp 98.3 F (36.8 C) (Oral)   Resp 17   SpO2 94%   Visual Acuity Right Eye Distance:   Left Eye Distance:   Bilateral Distance:    Right Eye Near:   Left Eye Near:    Bilateral Near:     Physical Exam Vitals and nursing note reviewed.  Constitutional:      Appearance: She is not ill-appearing or toxic-appearing.  HENT:     Head: Normocephalic and atraumatic.     Jaw: There is normal jaw occlusion.     Right Ear: Hearing, tympanic membrane, ear canal and external ear normal.     Left Ear: Hearing, tympanic membrane, ear canal and external ear normal.     Nose: Nose normal.     Mouth/Throat:  Lips: Pink.     Comments: Mild erythema to posterior oropharynx with small amount of clear postnasal drainage visualized to the pharynx.   Eyes:     General: Lids are normal. Vision grossly intact. Gaze aligned appropriately.     Extraocular Movements: Extraocular movements intact.     Conjunctiva/sclera: Conjunctivae normal.   Neck:     Trachea: Trachea normal.     Comments: Voice hoarseness. Pulmonary:     Effort: Pulmonary effort is normal.     Breath sounds: Examination of the right-upper field reveals wheezing. Decreased breath sounds (diminished breath sounds throughout all  lung fields bilateraly) and wheezing present. No rhonchi or rales.   Musculoskeletal:     Cervical back: Normal range of motion and neck supple. Pain with movement present. No spinous process tenderness or muscular tenderness.   Skin:    General: Skin is warm and dry.     Capillary Refill: Capillary refill takes less than 2 seconds.     Findings: No rash.   Neurological:     General: No focal deficit present.     Mental Status: She is alert and oriented to person, place, and time. Mental status is at baseline.     Cranial Nerves: No dysarthria or facial asymmetry.   Psychiatric:        Attention and Perception: Attention normal.        Mood and Affect: Mood is anxious.        Speech: Speech is rapid and pressured.        Behavior: Behavior normal.        Thought Content: Thought content normal.        Judgment: Judgment normal.      UC Treatments / Results  Labs (all labs ordered are listed, but only abnormal results are displayed) Labs Reviewed  POC SARS CORONAVIRUS 2 AG -  ED    EKG   Radiology DG Chest 2 View Result Date: 10/05/2023 CLINICAL DATA:  Four day history of cough, congestion, and shortness of breath EXAM: CHEST - 2 VIEW COMPARISON:  Chest radiograph dated 07/17/2013 FINDINGS: Normal lung volumes. No focal consolidations. No pleural effusion or pneumothorax. The heart size and mediastinal contours are within normal limits. No acute osseous abnormality. Medial right hemidiaphragmatic eventration. IMPRESSION: No active cardiopulmonary disease. Electronically Signed   By: Limin  Xu M.D.   On: 10/05/2023 10:37    Procedures Procedures (including critical care time)  Medications Ordered in UC Medications  albuterol  (PROVENTIL ) (2.5 MG/3ML) 0.083% nebulizer solution 2.5 mg (2.5 mg Nebulization Given 10/05/23 0949)    Initial Impression / Assessment and Plan / UC Course  I have reviewed the triage vital signs and the nursing notes.  Pertinent labs & imaging  results that were available during my care of the patient were reviewed by me and considered in my medical decision making (see chart for details).   1. Acute bronchitis, shortness of breath Evaluation suggests viral bronchitis, though given patient's history and risk for pneumonia, chest x-ray ordered. Chest x-ray negative for acute cardiopulmonary abnormality.  I'd like to cover for atypical bacterial infection with azithromycin  as I see some streaky opacities in the lingula on the lateral view of the x-ray despite negative read by radiology.   Albuterol  breathing treatment given in clinic with significant improvement in lung sounds and shortness of breath on reassessment.   Recommend treatment with steroid, bronchodilator, cough suppressants for symptomatic relief, and expectorants (mucinex ) as needed- see AVS.  Counseled patient on potential for adverse effects with medications prescribed/recommended today, strict ER and return-to-clinic precautions discussed, patient verbalized understanding.    Final Clinical Impressions(s) / UC Diagnoses   Final diagnoses:  Shortness of breath  Acute bronchitis, unspecified organism     Discharge Instructions      You have bronchitis which is inflammation of the upper airways in your lungs due to a virus.   Take azithromycin  antibiotic as prescribed to cover for atypical infection to the chest.  COVID test was negative in clinic.   We will treat this with steroids. Do not take any NSAIDs with steroid pills (no ibuprofen , naproxen  while taking steroid, this could cause stomach upset).  Take your last dose of the prednisone  prescribed by previous provider (40mg ) today, then take 2 more days of 40mg  (2 pills), then take 20 mg (1 tablet) for 3 more days until finished.   Use albuterol  every 4-6 hours as needed for cough, shortness of breath, and wheezing.   Use guaifenesin  (plain mucinex ) to break up congestion in nose/chest so that you are  able to excrete easier. Drink plenty of fluids to stay well hydrated while taking mucinex  so that it works well in the body.   If you develop any new or worsening symptoms or if your symptoms do not start to improve, please return here or follow-up with your primary care provider. If your symptoms are severe, please go to the emergency room.      ED Prescriptions     Medication Sig Dispense Auth. Provider   azithromycin  (ZITHROMAX ) 250 MG tablet Take 1 tablet (250 mg total) by mouth daily. Take first 2 tablets together, then 1 every day until finished. 6 tablet Starlene Eaton, FNP   albuterol  (VENTOLIN  HFA) 108 (90 Base) MCG/ACT inhaler Inhale 2 puffs into the lungs every 6 (six) hours as needed for wheezing or shortness of breath. 18 g Kynley Metzger M, FNP   predniSONE  (DELTASONE ) 20 MG tablet Take 2 tablets (40 mg total) by mouth daily with breakfast for 2 days, THEN 1 tablet (20 mg total) daily with breakfast for 3 days. 7 tablet Starlene Eaton, FNP   promethazine -dextromethorphan (PROMETHAZINE -DM) 6.25-15 MG/5ML syrup Take 5 mLs by mouth at bedtime as needed for cough. 118 mL Starlene Eaton, FNP      PDMP not reviewed this encounter.   Starlene Eaton, [place] 10/05/23 1050

## 2023-12-01 DIAGNOSIS — F331 Major depressive disorder, recurrent, moderate: Secondary | ICD-10-CM | POA: Diagnosis not present

## 2023-12-01 DIAGNOSIS — F900 Attention-deficit hyperactivity disorder, predominantly inattentive type: Secondary | ICD-10-CM | POA: Diagnosis not present

## 2023-12-01 DIAGNOSIS — F411 Generalized anxiety disorder: Secondary | ICD-10-CM | POA: Diagnosis not present

## 2023-12-25 ENCOUNTER — Telehealth: Admitting: Physician Assistant

## 2023-12-25 DIAGNOSIS — B379 Candidiasis, unspecified: Secondary | ICD-10-CM | POA: Diagnosis not present

## 2023-12-25 DIAGNOSIS — K5792 Diverticulitis of intestine, part unspecified, without perforation or abscess without bleeding: Secondary | ICD-10-CM | POA: Diagnosis not present

## 2023-12-25 DIAGNOSIS — T3695XA Adverse effect of unspecified systemic antibiotic, initial encounter: Secondary | ICD-10-CM | POA: Diagnosis not present

## 2023-12-25 MED ORDER — FLUCONAZOLE 150 MG PO TABS: 150.0000 mg | ORAL_TABLET | ORAL | 0 refills | Status: DC | PRN

## 2023-12-25 MED ORDER — AMOXICILLIN-POT CLAVULANATE 875-125 MG PO TABS: 1.0000 | ORAL_TABLET | Freq: Two times a day (BID) | ORAL | 0 refills | Status: DC

## 2023-12-25 MED ORDER — ONDANSETRON 4 MG PO TBDP: 4.0000 mg | ORAL_TABLET | Freq: Three times a day (TID) | ORAL | 0 refills | Status: AC | PRN

## 2023-12-25 NOTE — Patient Instructions (Signed)
 Ann Santos, thank you for joining Ann CHRISTELLA Dickinson, PA-C for today's virtual visit.  While this provider is not your primary care provider (PCP), if your PCP is located in our provider database this encounter information will be shared with them immediately following your visit.   A [facility] MyChart account gives you access to today's visit and all your visits, tests, and labs performed at [facility]  click here if you don't have a [facility] MyChart account or go to [email or web address]  Consent: (Patient) Ann Santos provided verbal consent for this virtual visit at the beginning of the encounter.  Current Medications:  Current Outpatient Medications:    amoxicillin -clavulanate (AUGMENTIN ) 875-125 MG tablet, Take 1 tablet by mouth 2 (two) times daily., Disp: 20 tablet, Rfl: 0   fluconazole  (DIFLUCAN ) 150 MG tablet, Take 1 tablet (150 mg total) by mouth every 3 (three) days as needed., Disp: 3 tablet, Rfl: 0   ondansetron  (ZOFRAN -ODT) 4 MG disintegrating tablet, Take 1 tablet (4 mg total) by mouth every 8 (eight) hours as needed., Disp: 20 tablet, Rfl: 0   acetaminophen  (TYLENOL ) 325 MG tablet, Take 650 mg by mouth every 6 (six) hours as needed for headache., Disp: , Rfl:    albuterol  (VENTOLIN  HFA) 108 (90 Base) MCG/ACT inhaler, Inhale 2 puffs into the lungs every 6 (six) hours as needed for wheezing or shortness of breath., Disp: 18 g, Rfl: 0   ALPRAZolam (XANAX) 0.5 MG tablet, Take 0.5 mg by mouth at bedtime as needed for anxiety., Disp: , Rfl:    azithromycin  (ZITHROMAX ) 250 MG tablet, Take 1 tablet (250 mg total) by mouth daily. Take first 2 tablets together, then 1 every day until finished., Disp: 6 tablet, Rfl: 0   budesonide -formoterol  (SYMBICORT ) 160-4.5 MCG/ACT inhaler, Inhale 2 puffs into the lungs every 12 (twelve) hours. (Patient not taking: Reported on 10/05/2023), Disp: 1 each, Rfl: 0   CALCIUM PO, Take 1 tablet daily by mouth., Disp: , Rfl:     Cyanocobalamin  1000 MCG SUBL, Place under the tongue., Disp: , Rfl:    famotidine  (PEPCID ) 20 MG tablet, Take 1 tablet (20 mg total) by mouth 2 (two) times daily., Disp: 30 tablet, Rfl: 0   guaifenesin  (HUMIBID E) 400 MG TABS tablet, Take 1 tablet 3 times daily as needed for chest congestion and cough, Disp: 30 tablet, Rfl: 0   lamoTRIgine (LAMICTAL) 150 MG tablet, Take 150 mg by mouth daily., Disp: , Rfl:    Lisdexamfetamine Dimesylate  (VYVANSE  PO), Take 60 mg by mouth daily., Disp: , Rfl:    LYSINE PO, Take 1 tablet by mouth daily., Disp: , Rfl:    Multiple Vitamins-Minerals (WOMENS MULTIVITAMIN) TABS, Take 1 tablet by mouth daily., Disp: , Rfl:    omeprazole  (PRILOSEC) 20 MG capsule, Take 1 capsule (20 mg total) by mouth daily., Disp: 30 capsule, Rfl: 0   Probiotic Product (PROBIOTIC-10 PO), Probiotic, Disp: , Rfl:    promethazine -dextromethorphan (PROMETHAZINE -DM) 6.25-15 MG/5ML syrup, Take 5 mLs by mouth at bedtime as needed for cough., Disp: 118 mL, Rfl: 0   sertraline  (ZOLOFT ) 100 MG tablet, TAKE 2 TABLETS(200 MG) BY MOUTH DAILY, Disp: 180 tablet, Rfl: 1   traZODone  (DESYREL ) 100 MG tablet, Take 2 tablets (200 mg total) by mouth at bedtime., Disp: 180 tablet, Rfl: 3   valACYclovir  (VALTREX ) 500 MG tablet, TAKE 1 TABLET BY MOUTH TWICE A DAY, Disp: 20 tablet, Rfl: 5   WEGOVY 1.7 MG/0.75ML SOAJ, Inject 1.7 mg into the skin., Disp: ,  Rfl:    Medications ordered in this encounter:  Meds ordered this encounter  Medications   amoxicillin -clavulanate (AUGMENTIN ) 875-125 MG tablet    Sig: Take 1 tablet by mouth 2 (two) times daily.    Dispense:  20 tablet    Refill:  0    Supervising Provider:   LAMPTEY, PHILIP O [[id number]]   fluconazole  (DIFLUCAN ) 150 MG tablet    Sig: Take 1 tablet (150 mg total) by mouth every 3 (three) days as needed.    Dispense:  3 tablet    Refill:  0    Supervising Provider:   LAMPTEY, PHILIP O [[id number]]   ondansetron  (ZOFRAN -ODT) 4 MG disintegrating tablet    Sig:  Take 1 tablet (4 mg total) by mouth every 8 (eight) hours as needed.    Dispense:  20 tablet    Refill:  0    Supervising Provider:   LAMPTEY, PHILIP O [[id number]]     *If you need refills on other medications prior to your next appointment, please contact your pharmacy*  Follow-Up: Call back or seek an in-person evaluation if the symptoms worsen or if the condition fails to improve as anticipated.   Virtual Care [phone]  Other Instructions Diverticulitis  Diverticulitis happens when poop (stool) and bacteria get trapped in small pouches in the colon called diverticula. These pouches may form if you have a condition called diverticulosis. When the poop and bacteria get trapped, it can cause an infection and inflammation. Diverticulitis may cause severe stomach pain and diarrhea. It can also lead to tissue damage in your colon. This can cause bleeding or blockage. In some cases, the diverticula may burst (rupture). This can cause infected poop to go into other parts of your abdomen. What are the causes? This condition is caused by poop getting trapped in the diverticula. This allows bacteria to grow. It can lead to inflammation and infection. What increases the risk? You are more likely to get this condition if you have diverticulosis. You are also more at risk if: You are overweight or obese. You do not get enough exercise. You drink alcohol. You smoke. You eat a lot of red meat, such as beef, pork, or lamb. You do not get enough fiber. Foods high in fiber include fruits, vegetables, beans, nuts, and whole grains. You are over 66 years of age. What are the signs or symptoms? Symptoms of this condition may include: Pain and tenderness in the abdomen. This pain is often felt on the left side but may occur in other spots. Fever and chills. Nausea and vomiting. Cramping. Bloating. Changes in how often you poop. Blood in your poop. How is this diagnosed? This  condition is diagnosed based on your medical history and a physical exam. You may also have tests done to make sure there is nothing else causing your condition. These tests may include: Blood tests. Tests done on your pee (urine). A CT scan of the abdomen. You may need to have a colonoscopy. This is an exam to look at your whole large intestine. During the exam, a tube is put into the opening of your butt (anus) and then moved into your rectum, colon, and other parts of the large intestine. This exam is done to look at the diverticula. It can also see if there is something else that may be causing your symptoms. How is this treated? Most cases are mild and can be treated at home. You may be told to: Take over-the-counter pain  medicine. Only eat and drink clear liquids. Take antibiotics. Rest. More severe cases may need to be treated at a hospital. Treatment may include: Not eating or drinking. Taking pain medicines. Getting antibiotics through an IV. Getting fluids and nutrition through an IV. Surgery. Follow these instructions at home: Medicines Take over-the-counter and prescription medicines only as told by your health care provider. These include fiber supplements, probiotics, and medicines to soften your poop (stool softeners). If you were prescribed antibiotics, take them as told by your provider. Do not stop using the antibiotic even if you start to feel better. Ask your provider if the medicine prescribed to you requires you to avoid driving or using machinery. Eating and drinking  Follow the diet told by your provider. You may need to only eat and drink liquids. After your symptoms get better, you may be able to return to a more normal diet. You may be told to eat at least 25 grams (25 g) of fiber each day. Fiber makes it easier to poop. Healthy sources of fiber include: Berries. One cup has 4-8 g of fiber. Beans or lentils. One-half cup has 5-8 g of fiber. Green vegetables.  One cup has 4 g of fiber. Avoid eating red meat. General instructions Do not use any products that contain nicotine or tobacco. These products include cigarettes, chewing tobacco, and vaping devices, such as e-cigarettes. If you need help quitting, ask your provider. Exercise for at least 30 minutes, 3 times a week. Exercise hard enough to raise your heart rate and break a sweat. Contact a health care provider if: Your pain gets worse. Your pooping does not go back to normal. Your symptoms do not get better with treatment. Your symptoms get worse all of a sudden. You have a fever. You vomit more than one time. Your poop is bloody, black, or tarry. This information is not intended to replace advice given to you by your health care provider. Make sure you discuss any questions you have with your health care provider. Document Revised: 01/06/2022 Document Reviewed: 01/06/2022 Elsevier Patient Education  2024 [employer]vier Inc.   If you have been instructed to have an in-person evaluation today at a local Urgent Care facility, please use the link below. It will take you to a list of all of our available [facility], including address, phone number and hours of operation. Please do not delay care.  [facility]  If you or a family member do not have a primary care provider, use the link below to schedule a visit and establish care. When you choose a [facility] primary care physician or advanced practice provider, you gain a long-term partner in health. Find a Primary Care Provider  Learn more about [facility]'s in-office and virtual care options: [facility] - Get Care Now

## 2023-12-25 NOTE — Progress Notes (Signed)
 Virtual Visit Consent   Ann Santos, you are scheduled for a virtual visit with a [facility] provider today. Just as with appointments in the office, your consent must be obtained to participate. Your consent will be active for this visit and any virtual visit you may have with one of our providers in the next 365 days. If you have a MyChart account, a copy of this consent can be sent to you electronically.  As this is a virtual visit, video technology does not allow for your provider to perform a traditional examination. This may limit your provider's ability to fully assess your condition. If your provider identifies any concerns that need to be evaluated in person or the need to arrange testing (such as labs, EKG, etc.), we will make arrangements to do so. Although advances in technology are sophisticated, we cannot ensure that it will always work on either your end or our end. If the connection with a video visit is poor, the visit may have to be switched to a telephone visit. With either a video or telephone visit, we are not always able to ensure that we have a secure connection.  By engaging in this virtual visit, you consent to the provision of healthcare and authorize for your insurance to be billed (if applicable) for the services provided during this visit. Depending on your insurance coverage, you may receive a charge related to this service.  I need to obtain your verbal consent now. Are you willing to proceed with your visit today? Ann Santos has provided verbal consent on 12/25/2023 for a virtual visit (video or telephone). [name], PA-C  Date: 12/25/2023 9:23 AM   Virtual Visit via Video Note   I, [name], connected with  Ann Santos  ([id number], 1980-06-03) on 12/25/23 at  9:15 AM EDT by a video-enabled telemedicine application and verified that I am speaking with the correct person using two identifiers.  Location: Patient: Virtual Visit Location  Patient: Home Provider: Virtual Visit Location Provider: Home Office   I discussed the limitations of evaluation and management by telemedicine and the availability of in person appointments. The patient expressed understanding and agreed to proceed.    History of Present Illness: Ann Santos is a 43 y.o. who identifies as a female who was assigned female at birth, and is being seen today for acute diverticulitis.  HPI: Abdominal Pain This is a recurrent problem. The current episode started in the past 7 days (Started Friday and progressing). The onset quality is sudden. The problem occurs intermittently. The problem has been gradually worsening. The pain is located in the LLQ. The pain is mild. The quality of the pain is colicky. The abdominal pain does not radiate. Associated symptoms include flatus and nausea (mild). Pertinent negatives include no constipation, diarrhea (constantly feels like she has to go to the bathroom frequently), fever, hematochezia, melena, myalgias or weight loss. Associated symptoms comments: Bloating. The pain is aggravated by movement and certain positions. The pain is relieved by Nothing. Treatments tried: Zofran , liquid diet. The treatment provided no relief. Prior diagnostic workup includes GI consult and CT scan.    Last flare was 09/14/23. Has GI appt in Feb 19, 2024  Problems:  Patient Active Problem List   Diagnosis Date Noted   History of DVT (deep vein thrombosis) 08/29/2019   On continuous oral anticoagulation 08/29/2019   Morbid obesity (HCC) 11/17/2014   Diverticular disease 10/23/2012   Umbilical hernia 10/26/2011  Allergies: No Known Allergies Medications:  Current Outpatient Medications:    amoxicillin -clavulanate (AUGMENTIN ) 875-125 MG tablet, Take 1 tablet by mouth 2 (two) times daily., Disp: 20 tablet, Rfl: 0   fluconazole  (DIFLUCAN ) 150 MG tablet, Take 1 tablet (150 mg total) by mouth every 3 (three) days as needed., Disp: 3 tablet,  Rfl: 0   ondansetron  (ZOFRAN -ODT) 4 MG disintegrating tablet, Take 1 tablet (4 mg total) by mouth every 8 (eight) hours as needed., Disp: 20 tablet, Rfl: 0   acetaminophen  (TYLENOL ) 325 MG tablet, Take 650 mg by mouth every 6 (six) hours as needed for headache., Disp: , Rfl:    albuterol  (VENTOLIN  HFA) 108 (90 Base) MCG/ACT inhaler, Inhale 2 puffs into the lungs every 6 (six) hours as needed for wheezing or shortness of breath., Disp: 18 g, Rfl: 0   ALPRAZolam (XANAX) 0.5 MG tablet, Take 0.5 mg by mouth at bedtime as needed for anxiety., Disp: , Rfl:    azithromycin  (ZITHROMAX ) 250 MG tablet, Take 1 tablet (250 mg total) by mouth daily. Take first 2 tablets together, then 1 every day until finished., Disp: 6 tablet, Rfl: 0   budesonide -formoterol  (SYMBICORT ) 160-4.5 MCG/ACT inhaler, Inhale 2 puffs into the lungs every 12 (twelve) hours. (Patient not taking: Reported on 10/05/2023), Disp: 1 each, Rfl: 0   CALCIUM PO, Take 1 tablet daily by mouth., Disp: , Rfl:    Cyanocobalamin  1000 MCG SUBL, Place under the tongue., Disp: , Rfl:    famotidine  (PEPCID ) 20 MG tablet, Take 1 tablet (20 mg total) by mouth 2 (two) times daily., Disp: 30 tablet, Rfl: 0   guaifenesin  (HUMIBID E) 400 MG TABS tablet, Take 1 tablet 3 times daily as needed for chest congestion and cough, Disp: 30 tablet, Rfl: 0   lamoTRIgine (LAMICTAL) 150 MG tablet, Take 150 mg by mouth daily., Disp: , Rfl:    Lisdexamfetamine Dimesylate  (VYVANSE  PO), Take 60 mg by mouth daily., Disp: , Rfl:    LYSINE PO, Take 1 tablet by mouth daily., Disp: , Rfl:    Multiple Vitamins-Minerals (WOMENS MULTIVITAMIN) TABS, Take 1 tablet by mouth daily., Disp: , Rfl:    omeprazole  (PRILOSEC) 20 MG capsule, Take 1 capsule (20 mg total) by mouth daily., Disp: 30 capsule, Rfl: 0   Probiotic Product (PROBIOTIC-10 PO), Probiotic, Disp: , Rfl:    promethazine -dextromethorphan (PROMETHAZINE -DM) 6.25-15 MG/5ML syrup, Take 5 mLs by mouth at bedtime as needed for cough.,  Disp: 118 mL, Rfl: 0   sertraline  (ZOLOFT ) 100 MG tablet, TAKE 2 TABLETS(200 MG) BY MOUTH DAILY, Disp: 180 tablet, Rfl: 1   traZODone  (DESYREL ) 100 MG tablet, Take 2 tablets (200 mg total) by mouth at bedtime., Disp: 180 tablet, Rfl: 3   valACYclovir  (VALTREX ) 500 MG tablet, TAKE 1 TABLET BY MOUTH TWICE A DAY, Disp: 20 tablet, Rfl: 5   WEGOVY 1.7 MG/0.75ML SOAJ, Inject 1.7 mg into the skin., Disp: , Rfl:   Observations/Objective: Patient is well-developed, well-nourished in no acute distress.  Resting comfortably at home.  Head is normocephalic, atraumatic.  No labored breathing.  Speech is clear and coherent with logical content.  Patient is alert and oriented at baseline.    Assessment and Plan: 1. Acute diverticulitis (Primary) - amoxicillin -clavulanate (AUGMENTIN ) 875-125 MG tablet; Take 1 tablet by mouth 2 (two) times daily.  Dispense: 20 tablet; Refill: 0 - ondansetron  (ZOFRAN -ODT) 4 MG disintegrating tablet; Take 1 tablet (4 mg total) by mouth every 8 (eight) hours as needed.  Dispense: 20 tablet; Refill: 0  2. Antibiotic-induced yeast infection - fluconazole  (DIFLUCAN ) 150 MG tablet; Take 1 tablet (150 mg total) by mouth every 3 (three) days as needed.  Dispense: 3 tablet; Refill: 0  - Recurrent diverticulitis - Augmentin  prescribed (has a tendency to forget pills or get mixed up with Cipro /Flagyl  dosing) - Liquid diet until pain subsides then increase diet slowly as tolerated - Zofran  for nausea - Diflucan  given as prophylaxis as patient tends to get vaginal yeast infections with antibiotic use - Strict ER precautions discussed - Follow up in person if worsening or not resolving  Follow Up Instructions: I discussed the assessment and treatment plan with the patient. The patient was provided an opportunity to ask questions and all were answered. The patient agreed with the plan and demonstrated an understanding of the instructions.  A copy of instructions were sent to the  patient via MyChart unless otherwise noted below.    The patient was advised to call back or seek an in-person evaluation if the symptoms worsen or if the condition fails to improve as anticipated.    [name], PA-C

## 2024-01-03 NOTE — Addendum Note (Signed)
 Addended by: VIVIENNE DELON HERO on: 01/03/2024 03:57 PM   Modules accepted: Level of Service

## 2024-01-24 ENCOUNTER — Other Ambulatory Visit (HOSPITAL_BASED_OUTPATIENT_CLINIC_OR_DEPARTMENT_OTHER): Payer: Self-pay

## 2024-01-24 ENCOUNTER — Other Ambulatory Visit (HOSPITAL_COMMUNITY): Payer: Self-pay

## 2024-01-24 DIAGNOSIS — R635 Abnormal weight gain: Secondary | ICD-10-CM | POA: Diagnosis not present

## 2024-01-24 DIAGNOSIS — Z13220 Encounter for screening for lipoid disorders: Secondary | ICD-10-CM | POA: Diagnosis not present

## 2024-01-24 DIAGNOSIS — Z131 Encounter for screening for diabetes mellitus: Secondary | ICD-10-CM | POA: Diagnosis not present

## 2024-01-24 DIAGNOSIS — E785 Hyperlipidemia, unspecified: Secondary | ICD-10-CM | POA: Diagnosis not present

## 2024-01-24 MED ORDER — WEGOVY 0.25 MG/0.5ML ~~LOC~~ SOAJ
0.2500 mg | SUBCUTANEOUS | 0 refills | Status: AC
  Filled 2024-01-24 – 2024-01-26 (×3): qty 2, 28d supply, fill #0

## 2024-01-26 ENCOUNTER — Other Ambulatory Visit (HOSPITAL_BASED_OUTPATIENT_CLINIC_OR_DEPARTMENT_OTHER): Payer: Self-pay

## 2024-01-27 ENCOUNTER — Ambulatory Visit: Admission: RE | Admit: 2024-01-27 | Discharge: 2024-01-27 | Disposition: A | Discharge status: Home / Self Care

## 2024-01-27 ENCOUNTER — Other Ambulatory Visit (HOSPITAL_BASED_OUTPATIENT_CLINIC_OR_DEPARTMENT_OTHER): Payer: Self-pay

## 2024-01-27 VITALS — BP 114/81 | HR 80 | Temp 98.1°F | Resp 20

## 2024-01-27 DIAGNOSIS — M542 Cervicalgia: Secondary | ICD-10-CM

## 2024-01-27 DIAGNOSIS — R590 Localized enlarged lymph nodes: Secondary | ICD-10-CM

## 2024-01-27 MED ORDER — PREDNISONE 20 MG PO TABS
ORAL_TABLET | ORAL | 0 refills | Status: AC
  Filled 2024-01-27: qty 13, 7d supply, fill #0

## 2024-01-27 MED ORDER — PREDNISONE 20 MG PO TABS: ORAL_TABLET | ORAL | 0 refills | Status: DC

## 2024-01-27 NOTE — ED Provider Notes (Signed)
 [facility]    CSN: [id number] Arrival date & time: 01/27/24  1001      History   Chief Complaint Chief Complaint  Patient presents with   Ear Fullness    My right ear is hurting life had a  terrible headache fatigue and nausea for 14 days and I thought I had pulled something in my neck but it's definitely my ear and It's hard to turn my head or sleep - Entered by patient   Headache   Nausea   Fatigue    HPI Ann Santos is a 43 y.o. female.  has a past medical history of Abdominal distention, Abdominal pain, Anxiety, Blood in stool, Constipation, Diverticulitis, Diverticulosis, Gestational hypertension (07/05/2013), Gestational hypertension (07/05/2013), History of chicken pox, History of hiatal hernia, Hypertension, Maternal obesity, antepartum (07/05/2013), Maternal obesity, antepartum (07/05/2013), Numbness, Rectal bleeding, Shortness of breath dyspnea, and Umbilical hernia.   HPI  Pt presents today with concerns for right ear pain and neck pain She states about 2 weeks ago she started having neck pain and headaches that were very severe. She states the headache has gotten a bit better but is still present and she is concerned there may be something going on in her ear due to pain  She reports having a popping sensation in the right ear and pain along the neck just below the right ear Interventions: sudafed, tylenol , she takes daily allergy medication, Zofran   She has previous hx of gastric sleeve surgery so she tries to avoid NSAIDs  She reports that muscle relaxers usually make her very sleepy and she is knocked out for days   Past Medical History:  Diagnosis Date   Abdominal distention    Abdominal pain    Anxiety    Blood in stool    Constipation    Diverticulitis    Per pt. States she never had CT scan to confirm this   Diverticulosis    PT [facility]IZED AT [facility] October 03, 2011   Gestational hypertension 07/05/2013   Gestational hypertension 07/05/2013    History of chicken pox    childhood   History of hiatal hernia    Hypertension    During pregnancy only   Maternal obesity, antepartum 07/05/2013   Maternal obesity, antepartum 07/05/2013   Numbness    first two fingers right hand    Rectal bleeding    Shortness of breath dyspnea    with exercise   Umbilical hernia    planning surgical repair    Patient Active Problem List   Diagnosis Date Noted   History of DVT (deep vein thrombosis) 08/29/2019   On continuous oral anticoagulation 08/29/2019   Morbid obesity (HCC) 11/17/2014   Diverticular disease 10/23/2012   Umbilical hernia 10/26/2011    Past Surgical History:  Procedure Laterality Date   ABDOMINOPLASTY     w/ hernia revision 2020 @ [facility]   BREATH TEK H PYLORI N/A 07/22/2014   Procedure: BREATH TEK H PYLORI;  Surgeon: Alm Angle, MD;  Location: [facility];  Service: General;  Laterality: N/A;   CESAREAN SECTION  05/28/2009   CESAREAN SECTION N/A 07/05/2013   Procedure: REPEAT CESAREAN SECTION (With Special Wound Vac);  Surgeon: Robbi JONELLE Render, MD;  Location: [facility];  Service: Obstetrics;  Laterality: N/A;   EDD: 07/23/13   COLONOSCOPY  10/07/2011   Procedure: COLONOSCOPY;  Surgeon: Belvie JONETTA Just, MD;  Location: [facility];  Service: Endoscopy;  Laterality: N/A;   ESOPHAGOGASTRODUODENOSCOPY  10/07/2011  Procedure: ESOPHAGOGASTRODUODENOSCOPY (EGD);  Surgeon: Belvie JONETTA Just, MD;  Location: [facility];  Service: Endoscopy;  Laterality: N/A;   HERNIA REPAIR     LAPAROSCOPIC GASTRIC SLEEVE RESECTION WITH HIATAL HERNIA REPAIR N/A 11/17/2014   Procedure: LAPAROSCOPIC GASTRIC SLEEVE RESECTION ;  Surgeon: Alm Angle, MD;  Location: [facility];  Service: General;  Laterality: N/A;   SCAR REVISION  09/17/2009   c-section scar revision   UMBILICAL HERNIA REPAIR  11/01/2011   Procedure: HERNIA REPAIR UMBILICAL ADULT;  Surgeon: Krystal CHRISTELLA Spinner, MD;  Location: [facility];  Service: General;  Laterality: N/A;    OB History      Gravida  2   Para  2   Term  2   Preterm      AB      Living  2      SAB      IAB      Ectopic      Multiple      Live Births  2            Home Medications    Prior to Admission medications   Medication Sig Start Date End Date Taking? Authorizing Provider  cetirizine  (ZYRTEC ) 10 MG tablet Take 10 mg by mouth. 02/23/18  Yes [provider]  acetaminophen  (TYLENOL ) 325 MG tablet Take 650 mg by mouth every 6 (six) hours as needed for headache.    [provider]  albuterol  (VENTOLIN  HFA) 108 (90 Base) MCG/ACT inhaler Inhale 2 puffs into the lungs every 6 (six) hours as needed for wheezing or shortness of breath. 10/05/23   Enedelia Dorna CHRISTELLA, FNP  ALPRAZolam (XANAX) 0.5 MG tablet Take 0.5 mg by mouth at bedtime as needed for anxiety.    [provider]  azithromycin  (ZITHROMAX ) 250 MG tablet Take 1 tablet (250 mg total) by mouth daily. Take first 2 tablets together, then 1 every day until finished. 10/05/23   Enedelia Dorna CHRISTELLA, FNP  budesonide -formoterol  (SYMBICORT ) 160-4.5 MCG/ACT inhaler Inhale 2 puffs into the lungs every 12 (twelve) hours. Patient not taking: Reported on 10/05/2023 06/01/23   Joesph Shaver Scales, PA-C  CALCIUM PO Take 1 tablet daily by mouth.    [provider]  Cyanocobalamin  1000 MCG SUBL Place under the tongue.    [provider]  famotidine  (PEPCID ) 20 MG tablet Take 1 tablet (20 mg total) by mouth 2 (two) times daily. 10/01/23   Christopher Savannah, PA-C  guaifenesin  (HUMIBID E) 400 MG TABS tablet Take 1 tablet 3 times daily as needed for chest congestion and cough 06/01/23   Joesph Shaver Scales, PA-C  lamoTRIgine (LAMICTAL) 150 MG tablet Take 150 mg by mouth daily.    [provider]  Lisdexamfetamine Dimesylate  (VYVANSE  PO) Take 60 mg by mouth daily. 05/17/23   [provider]  LYSINE PO Take 1 tablet by mouth daily.    [provider]  Multiple Vitamins-Minerals (WOMENS  MULTIVITAMIN) TABS Take 1 tablet by mouth daily.    [provider]  omeprazole  (PRILOSEC) 20 MG capsule Take 1 capsule (20 mg total) by mouth daily. 10/01/23   Christopher Savannah, PA-C  ondansetron  (ZOFRAN -ODT) 4 MG disintegrating tablet Take 1 tablet (4 mg total) by mouth every 8 (eight) hours as needed. 12/25/23   Vivienne Delon CHRISTELLA, PA-C  predniSONE  (DELTASONE ) 20 MG tablet Take 60mg  daily x 2 days, then 40mg  daily x 2 days, then 20mg  daily x 3 days 01/27/24   Jonnie Truxillo E, PA-C  Probiotic Product (  PROBIOTIC-10 PO) Probiotic    [provider]  promethazine -dextromethorphan (PROMETHAZINE -DM) 6.25-15 MG/5ML syrup Take 5 mLs by mouth at bedtime as needed for cough. 10/05/23   Enedelia Dorna HERO, FNP  semaglutide-weight management (WEGOVY) 0.25 MG/0.5ML SOAJ SQ injection Inject 0.25 mg into the skin every 7 (seven) days. 01/24/24     sertraline  (ZOLOFT ) 100 MG tablet TAKE 2 TABLETS(200 MG) BY MOUTH DAILY 06/27/23   Purcell Emil Schanz, MD  traZODone  (DESYREL ) 100 MG tablet Take 2 tablets (200 mg total) by mouth at bedtime. 01/11/23 01/06/24  Purcell Emil Schanz, MD  valACYclovir  (VALTREX ) 500 MG tablet TAKE 1 TABLET BY MOUTH TWICE A DAY 08/07/22   Sagardia, Emil Schanz, MD  WEGOVY 1.7 MG/0.75ML SOAJ Inject 1.7 mg into the skin. 10/31/22   [provider]    Family History Family History  Problem Relation Age of Onset   Diabetes Mother    Hypertension Mother    Ulcerative colitis Father    Hypertension Father    Cancer Maternal Grandmother        lung    Social History Social History   Tobacco Use   Smoking status: Never   Smokeless tobacco: Never  Vaping Use   Vaping status: Never Used  Substance Use Topics   Alcohol use: Yes    Comment: occasionally   Drug use: No     Allergies   Patient has no known allergies.   Review of Systems Review of Systems  Constitutional:  Negative for chills and fever.  HENT:  Negative for congestion, ear pain and rhinorrhea.    Respiratory:  Negative for cough and shortness of breath.   Gastrointestinal:  Positive for nausea.  Neurological:  Positive for headaches.     Physical Exam Triage Vital Signs ED Triage Vitals  Encounter Vitals Group     BP 01/27/24 1012 114/81     Girls Systolic BP Percentile --      Girls Diastolic BP Percentile --      Boys Systolic BP Percentile --      Boys Diastolic BP Percentile --      Pulse Rate 01/27/24 1012 80     Resp 01/27/24 1012 20     Temp 01/27/24 1012 98.1 F (36.7 C)     Temp Source 01/27/24 1012 Oral     SpO2 01/27/24 1012 97 %     Weight --      Height --      Head Circumference --      Peak Flow --      Pain Score 01/27/24 1009 7     Pain Loc --      Pain Education --      Exclude from Growth Chart --    No data found.  Updated Vital Signs BP 114/81 (BP Location: Right Arm)   Pulse 80   Temp 98.1 F (36.7 C) (Oral)   Resp 20   SpO2 97%   Visual Acuity Right Eye Distance:   Left Eye Distance:   Bilateral Distance:    Right Eye Near:   Left Eye Near:    Bilateral Near:     Physical Exam Vitals reviewed.  Constitutional:      General: She is awake.     Appearance: Normal appearance. She is well-developed and well-groomed.  HENT:     Head: Normocephalic and atraumatic.     Jaw: No trismus, tenderness or pain on movement.      Right Ear: Hearing and  ear canal normal. Tympanic membrane is scarred.     Left Ear: Hearing, tympanic membrane and ear canal normal.     Mouth/Throat:     Lips: Pink.     Mouth: Mucous membranes are moist.     Pharynx: Oropharynx is clear. Uvula midline. No pharyngeal swelling, oropharyngeal exudate, posterior oropharyngeal erythema, uvula swelling or postnasal drip.  Cardiovascular:     Rate and Rhythm: Normal rate and regular rhythm.     Pulses: Normal pulses.          Radial pulses are 2+ on the right side and 2+ on the left side.     Heart sounds: Normal heart sounds. No murmur heard.    No friction  rub. No gallop.  Pulmonary:     Effort: Pulmonary effort is normal.     Breath sounds: Normal breath sounds. No decreased air movement. No decreased breath sounds, wheezing, rhonchi or rales.  Musculoskeletal:     Cervical back: Normal range of motion and neck supple. No erythema or torticollis. Muscular tenderness present. Normal range of motion.  Lymphadenopathy:     Head:     Right side of head: Posterior auricular adenopathy present. No submental, submandibular or preauricular adenopathy.     Left side of head: No submental, submandibular or preauricular adenopathy.     Cervical: Cervical adenopathy present.     Right cervical: Superficial cervical adenopathy present.     Left cervical: No superficial cervical adenopathy.     Upper Body:     Right upper body: No supraclavicular adenopathy.     Left upper body: No supraclavicular adenopathy.  Neurological:     Mental Status: She is alert.  Psychiatric:        Behavior: Behavior is cooperative.      UC Treatments / Results  Labs (all labs ordered are listed, but only abnormal results are displayed) Labs Reviewed - No data to display  EKG   Radiology No results found.  Procedures Procedures (including critical care time)  Medications Ordered in UC Medications - No data to display  Initial Impression / Assessment and Plan / UC Course  I have reviewed the triage vital signs and the nursing notes.  Pertinent labs & imaging results that were available during my care of the patient were reviewed by me and considered in my medical decision making (see chart for details).      Final Clinical Impressions(s) / UC Diagnoses   Final diagnoses:  Neck pain on right side  Enlarged lymph node in neck   Patient presents today with concerns for right sided neck pain and concern for ear infection.  She states that the area just below the right ear leading down of the neck has been exquisitely painful and tender to palpation for  the last 2 weeks.  She also reports a slight popping noise in the ears.  Physical exam is largely reassuring with the exception of mild nodular swelling just below the right ear and behind the right ear.  Based on her symptoms as well as presentation I suspect lymph node inflammation especially since she is recovering from recent viral illness.  Cannot completely rule out muscular etiology either.  Recommend patient continues Tylenol  for pain management as well as use warm compresses to the area.  Will send in prednisone  taper to assist with inflammation and potential muscular pain.  Patient reports that she typically does not respond well to also relaxers so we decided to forego this today.  Recommend follow-up here or with PCP if symptoms or not improving or seem to be worsening.    Discharge Instructions      You were seen today for concerns of right sided neck pain and mild swelling.  Based on your symptoms as well as your physical exam I suspect you may have a slightly enlarged lymph node.  These can be a little painful and cause discomfort but should resolve over the next 1 to 3 weeks.  You can continue taking Tylenol  as needed for pain management, using warm compresses to the area and gentle stretches. Your symptoms may also be muscular in nature so to help with this I have sent in a prednisone  taper to assist with inflammation.   I have sent in a script for Prednisone  taper to be taken in the morning with breakfast per the instructions on the container Remember that steroids can cause sleeplessness, irritability, increased hunger and elevated glucose levels so be mindful of these side effects. They should lessen as you progress to the lower doses of the taper. To help ensure that the prednisone  does not cause GI upset or stomach irritation please continue your Prilosec daily.  If your symptoms are not improving or seem to be worsening please return here or follow-up with your PCP.     ED  Prescriptions     Medication Sig Dispense Auth. Provider   predniSONE  (DELTASONE ) 20 MG tablet  (Status: Discontinued) Take 60mg  PO daily x 2 days, then40mg  PO daily x 2 days, then 20mg  PO daily x 3 days 13 tablet Makynli Stills E, PA-C   predniSONE  (DELTASONE ) 20 MG tablet Take 60mg  daily x 2 days, then 40mg  daily x 2 days, then 20mg  daily x 3 days 13 tablet Tian Davison E, PA-C      PDMP not reviewed this encounter.   Marylene Rocky BRAVO, PA-C 01/27/24 1715

## 2024-01-27 NOTE — Discharge Instructions (Addendum)
 You were seen today for concerns of right sided neck pain and mild swelling.  Based on your symptoms as well as your physical exam I suspect you may have a slightly enlarged lymph node.  These can be a little painful and cause discomfort but should resolve over the next 1 to 3 weeks.  You can continue taking Tylenol  as needed for pain management, using warm compresses to the area and gentle stretches. Your symptoms may also be muscular in nature so to help with this I have sent in a prednisone  taper to assist with inflammation.   I have sent in a script for Prednisone  taper to be taken in the morning with breakfast per the instructions on the container Remember that steroids can cause sleeplessness, irritability, increased hunger and elevated glucose levels so be mindful of these side effects. They should lessen as you progress to the lower doses of the taper. To help ensure that the prednisone  does not cause GI upset or stomach irritation please continue your Prilosec daily.  If your symptoms are not improving or seem to be worsening please return here or follow-up with your PCP.

## 2024-01-27 NOTE — ED Triage Notes (Signed)
 Pt c/o headache (primarily to right side), nausea, right sided neck pain, fatigue, and hearing popping in right ear.   Started: most symptoms started 10 days ago  Home interventions: sudafed, tylenol , OTC allergy medication, Zofran 

## 2024-02-20 DIAGNOSIS — R1032 Left lower quadrant pain: Secondary | ICD-10-CM | POA: Diagnosis not present

## 2024-02-20 DIAGNOSIS — K5792 Diverticulitis of intestine, part unspecified, without perforation or abscess without bleeding: Secondary | ICD-10-CM | POA: Diagnosis not present

## 2024-02-20 DIAGNOSIS — R1030 Lower abdominal pain, unspecified: Secondary | ICD-10-CM | POA: Diagnosis not present

## 2024-02-21 DIAGNOSIS — F900 Attention-deficit hyperactivity disorder, predominantly inattentive type: Secondary | ICD-10-CM | POA: Diagnosis not present

## 2024-02-21 DIAGNOSIS — F411 Generalized anxiety disorder: Secondary | ICD-10-CM | POA: Diagnosis not present

## 2024-02-21 DIAGNOSIS — F331 Major depressive disorder, recurrent, moderate: Secondary | ICD-10-CM | POA: Diagnosis not present

## 2024-03-03 ENCOUNTER — Ambulatory Visit
Admission: RE | Admit: 2024-03-03 | Discharge: 2024-03-03 | Disposition: A | Discharge status: Home / Self Care | Source: Ambulatory Visit | Attending: Family Medicine

## 2024-03-03 VITALS — BP 131/87 | HR 97 | Temp 97.6°F | Resp 17

## 2024-03-03 DIAGNOSIS — S0502XA Injury of conjunctiva and corneal abrasion without foreign body, left eye, initial encounter: Secondary | ICD-10-CM

## 2024-03-03 MED ORDER — OFLOXACIN 0.3 % OP SOLN: 1.0000 [drp] | Freq: Four times a day (QID) | OPHTHALMIC | 0 refills | Status: AC

## 2024-03-03 NOTE — ED Provider Notes (Signed)
 [facility]    CSN: [id number] Arrival date & time: 03/03/24  1032      History   Chief Complaint Chief Complaint  Patient presents with   Eye Problem    I was cutting back my rose bushes and a thorn got me in my left eye it's hurts really bad and it feels scratchy and weepy and I want to be able to see :) - Entered by patient    HPI Ann Santos is a 43 y.o. female presents for eye injury.  Patient states yesterday she was trimming her rosebushes when she went to throw branch over her shoulder and it hit her in the left eye.  States since then she has had watery drainage with foreign body sensation and photophobia.  Intermittent blurry vision.  Does not wear contacts or glasses.  No history of eye surgeries.  She does not use any OTC treatments since injury occurred.  No other concerns at this time   Eye Problem Associated symptoms: discharge and photophobia     Past Medical History:  Diagnosis Date   Abdominal distention    Abdominal pain    Anxiety    Blood in stool    Constipation    Diverticulitis    Per pt. States she never had CT scan to confirm this   Diverticulosis    PT [facility]IZED AT [facility] October 03, 2011   Gestational hypertension 07/05/2013   Gestational hypertension 07/05/2013   History of chicken pox    childhood   History of hiatal hernia    Hypertension    During pregnancy only   Maternal obesity, antepartum 07/05/2013   Maternal obesity, antepartum 07/05/2013   Numbness    first two fingers right hand    Rectal bleeding    Shortness of breath dyspnea    with exercise   Umbilical hernia    planning surgical repair    Patient Active Problem List   Diagnosis Date Noted   History of DVT (deep vein thrombosis) 08/29/2019   On continuous oral anticoagulation 08/29/2019   Morbid obesity (HCC) 11/17/2014   Diverticular disease 10/23/2012   Umbilical hernia 10/26/2011    Past Surgical History:  Procedure Laterality Date    ABDOMINOPLASTY     w/ hernia revision 2020 @ [facility]   BREATH TEK H PYLORI N/A 07/22/2014   Procedure: BREATH TEK H PYLORI;  Surgeon: Alm Angle, MD;  Location: [facility];  Service: General;  Laterality: N/A;   CESAREAN SECTION  05/28/2009   CESAREAN SECTION N/A 07/05/2013   Procedure: REPEAT CESAREAN SECTION (With Special Wound Vac);  Surgeon: Robbi JONELLE Render, MD;  Location: [facility];  Service: Obstetrics;  Laterality: N/A;   EDD: 07/23/13   COLONOSCOPY  10/07/2011   Procedure: COLONOSCOPY;  Surgeon: Belvie JONETTA Just, MD;  Location: [facility];  Service: Endoscopy;  Laterality: N/A;   ESOPHAGOGASTRODUODENOSCOPY  10/07/2011   Procedure: ESOPHAGOGASTRODUODENOSCOPY (EGD);  Surgeon: Belvie JONETTA Just, MD;  Location: [facility];  Service: Endoscopy;  Laterality: N/A;   HERNIA REPAIR     LAPAROSCOPIC GASTRIC SLEEVE RESECTION WITH HIATAL HERNIA REPAIR N/A 11/17/2014   Procedure: LAPAROSCOPIC GASTRIC SLEEVE RESECTION ;  Surgeon: Alm Angle, MD;  Location: [facility];  Service: General;  Laterality: N/A;   SCAR REVISION  09/17/2009   c-section scar revision   UMBILICAL HERNIA REPAIR  11/01/2011   Procedure: HERNIA REPAIR UMBILICAL ADULT;  Surgeon: Krystal CHRISTELLA Spinner, MD;  Location: [facility];  Service: General;  Laterality:  N/A;    OB History     Gravida  2   Para  2   Term  2   Preterm      AB      Living  2      SAB      IAB      Ectopic      Multiple      Live Births  2            Home Medications    Prior to Admission medications   Medication Sig Start Date End Date Taking? Authorizing Provider  acetaminophen  (TYLENOL ) 325 MG tablet Take 650 mg by mouth every 6 (six) hours as needed for headache.    [provider]  albuterol  (VENTOLIN  HFA) 108 (90 Base) MCG/ACT inhaler Inhale 2 puffs into the lungs every 6 (six) hours as needed for wheezing or shortness of breath. 10/05/23   Enedelia Dorna HERO, FNP  ALPRAZolam (XANAX) 0.5 MG tablet Take 0.5 mg by mouth at bedtime  as needed for anxiety.    [provider]  azithromycin  (ZITHROMAX ) 250 MG tablet Take 1 tablet (250 mg total) by mouth daily. Take first 2 tablets together, then 1 every day until finished. 10/05/23   Enedelia Dorna HERO, FNP  budesonide -formoterol  (SYMBICORT ) 160-4.5 MCG/ACT inhaler Inhale 2 puffs into the lungs every 12 (twelve) hours. Patient not taking: Reported on 10/05/2023 06/01/23   Joesph Shaver Scales, PA-C  CALCIUM PO Take 1 tablet daily by mouth.    [provider]  cetirizine  (ZYRTEC ) 10 MG tablet Take 10 mg by mouth. 02/23/18   [provider]  Cyanocobalamin  1000 MCG SUBL Place under the tongue.    [provider]  famotidine  (PEPCID ) 20 MG tablet Take 1 tablet (20 mg total) by mouth 2 (two) times daily. 10/01/23   Christopher Savannah, PA-C  guaifenesin  (HUMIBID E) 400 MG TABS tablet Take 1 tablet 3 times daily as needed for chest congestion and cough 06/01/23   Joesph Shaver Scales, PA-C  lamoTRIgine (LAMICTAL) 150 MG tablet Take 150 mg by mouth daily.    [provider]  Lisdexamfetamine Dimesylate  (VYVANSE  PO) Take 60 mg by mouth daily. 05/17/23   [provider]  LYSINE PO Take 1 tablet by mouth daily.    [provider]  Multiple Vitamins-Minerals (WOMENS MULTIVITAMIN) TABS Take 1 tablet by mouth daily.    [provider]  ofloxacin (OCUFLOX) 0.3 % ophthalmic solution Place 1 drop into the left eye 4 (four) times daily for 5 days. 03/03/24 03/08/24 Yes Itzabella Sorrels, Jodi R, NP  omeprazole  (PRILOSEC) 20 MG capsule Take 1 capsule (20 mg total) by mouth daily. 10/01/23   Christopher Savannah, PA-C  ondansetron  (ZOFRAN -ODT) 4 MG disintegrating tablet Take 1 tablet (4 mg total) by mouth every 8 (eight) hours as needed. 12/25/23   Vivienne Delon HERO, PA-C  predniSONE  (DELTASONE ) 20 MG tablet Take 60mg  daily x 2 days, then 40mg  daily x 2 days, then 20mg  daily x 3 days 01/27/24   Mecum, Erin E, PA-C  Probiotic Product (PROBIOTIC-10 PO) Probiotic     [provider]  promethazine -dextromethorphan (PROMETHAZINE -DM) 6.25-15 MG/5ML syrup Take 5 mLs by mouth at bedtime as needed for cough. 10/05/23   Enedelia Dorna HERO, FNP  semaglutide-weight management (WEGOVY) 0.25 MG/0.5ML SOAJ SQ injection Inject 0.25 mg into the skin every 7 (seven) days. 01/24/24     sertraline  (ZOLOFT ) 100 MG tablet TAKE 2 TABLETS(200 MG) BY MOUTH DAILY 06/27/23  Sagardia, Miguel Jose, MD  traZODone  (DESYREL ) 100 MG tablet Take 2 tablets (200 mg total) by mouth at bedtime. 01/11/23 01/06/24  Purcell Emil Schanz, MD  valACYclovir  (VALTREX ) 500 MG tablet TAKE 1 TABLET BY MOUTH TWICE A DAY 08/07/22   Sagardia, Emil Schanz, MD  WEGOVY 1.7 MG/0.75ML SOAJ Inject 1.7 mg into the skin. 10/31/22   [provider]    Family History Family History  Problem Relation Age of Onset   Diabetes Mother    Hypertension Mother    Ulcerative colitis Father    Hypertension Father    Cancer Maternal Grandmother        lung    Social History Social History   Tobacco Use   Smoking status: Never   Smokeless tobacco: Never  Vaping Use   Vaping status: Never Used  Substance Use Topics   Alcohol use: Yes    Comment: occasionally   Drug use: No     Allergies   Patient has no known allergies.   Review of Systems Review of Systems  Eyes:  Positive for photophobia and discharge.     Physical Exam Triage Vital Signs ED Triage Vitals  Encounter Vitals Group     BP 03/03/24 1038 131/87     Girls Systolic BP Percentile --      Girls Diastolic BP Percentile --      Boys Systolic BP Percentile --      Boys Diastolic BP Percentile --      Pulse Rate 03/03/24 1038 97     Resp 03/03/24 1038 17     Temp 03/03/24 1038 97.6 F (36.4 C)     Temp src --      SpO2 03/03/24 1038 95 %     Weight --      Height --      Head Circumference --      Peak Flow --      Pain Score 03/03/24 1037 5     Pain Loc --      Pain Education --      Exclude from Growth Chart  --    No data found.  Updated Vital Signs BP 131/87 (BP Location: Right Arm)   Pulse 97   Temp 97.6 F (36.4 C)   Resp 17   SpO2 95%   Visual Acuity Right Eye Distance: 20/20 Left Eye Distance: 20/30 Bilateral Distance: 20/20  Right Eye Near:   Left Eye Near:    Bilateral Near:     Physical Exam Vitals and nursing note reviewed.  Constitutional:      General: She is not in acute distress.    Appearance: Normal appearance. She is not ill-appearing.  HENT:     Head: Normocephalic and atraumatic.  Eyes:     General: Lids are normal.        Left eye: No foreign body, discharge or hordeolum.     Conjunctiva/sclera:     Left eye: Left conjunctiva is injected. No chemosis, exudate or hemorrhage.    Pupils: Pupils are equal, round, and reactive to light.     Left eye: Corneal abrasion and fluorescein uptake present.   Cardiovascular:     Rate and Rhythm: Normal rate.  Pulmonary:     Effort: Pulmonary effort is normal.  Skin:    General: Skin is warm and dry.  Neurological:     General: No focal deficit present.     Mental Status: She is alert and oriented to person, place,  and time.  Psychiatric:        Mood and Affect: Mood normal.        Behavior: Behavior normal.     Visual Acuity Visual Acuity Bilateral Distance: 20/20R Distance: 20/20L Distance: 20/30    UC Treatments / Results  Labs (all labs ordered are listed, but only abnormal results are displayed) Labs Reviewed - No data to display  EKG   Radiology No results found.  Procedures Procedures (including critical care time)  Medications Ordered in UC Medications - No data to display  Initial Impression / Assessment and Plan / UC Course  I have reviewed the triage vital signs and the nursing notes.  Pertinent labs & imaging results that were available during my care of the patient were reviewed by me and considered in my medical decision making (see chart for details).     Positive corneal  abrasion, start antibiotic eyedrops as prescribed.  Advised PCP or ophthalmology follow-up if symptoms do not improve.  ER precautions reviewed Final Clinical Impressions(s) / UC Diagnoses   Final diagnoses:  Abrasion of left cornea, initial encounter     Discharge Instructions      Start ofloxacin antibiotic eyedrops for 5 days.  Warm compresses to the eye as needed.  Please follow-up with your PCP or ophthalmology if your symptoms are not improving.  Please go to the emergency room for any worsening symptoms.  Hope you feel better soon exclamation    ED Prescriptions     Medication Sig Dispense Auth. Provider   ofloxacin (OCUFLOX) 0.3 % ophthalmic solution Place 1 drop into the left eye 4 (four) times daily for 5 days. 5 mL Shaindy Reader, Jodi R, NP      PDMP not reviewed this encounter.   Loreda Myla SAUNDERS, NP 03/03/24 1100

## 2024-03-03 NOTE — ED Triage Notes (Addendum)
 Pt present due to injury to left eye. She was cutting rose bushes yesterday and had a branch hit her left eye when she threw it over her shoulder. C/o eye irritation and drainage

## 2024-03-03 NOTE — Discharge Instructions (Signed)
 Start ofloxacin antibiotic eyedrops for 5 days.  Warm compresses to the eye as needed.  Please follow-up with your PCP or ophthalmology if your symptoms are not improving.  Please go to the emergency room for any worsening symptoms.  Hope you feel better soon exclamation

## 2024-03-04 DIAGNOSIS — F331 Major depressive disorder, recurrent, moderate: Secondary | ICD-10-CM | POA: Diagnosis not present

## 2024-03-04 DIAGNOSIS — F411 Generalized anxiety disorder: Secondary | ICD-10-CM | POA: Diagnosis not present

## 2024-03-18 DIAGNOSIS — F411 Generalized anxiety disorder: Secondary | ICD-10-CM | POA: Diagnosis not present

## 2024-03-18 DIAGNOSIS — F331 Major depressive disorder, recurrent, moderate: Secondary | ICD-10-CM | POA: Diagnosis not present

## 2024-04-09 DIAGNOSIS — F331 Major depressive disorder, recurrent, moderate: Secondary | ICD-10-CM | POA: Diagnosis not present

## 2024-04-09 DIAGNOSIS — F411 Generalized anxiety disorder: Secondary | ICD-10-CM | POA: Diagnosis not present
